# Patient Record
Sex: Female | Born: 1945
Health system: Southern US, Community
[De-identification: ages and names within clinical notes are randomized; demographics above are authoritative.]

## PROBLEM LIST (undated history)

## (undated) DIAGNOSIS — R55 Syncope and collapse: Secondary | ICD-10-CM

## (undated) DIAGNOSIS — E785 Hyperlipidemia, unspecified: Secondary | ICD-10-CM

## (undated) DIAGNOSIS — E1169 Type 2 diabetes mellitus with other specified complication: Secondary | ICD-10-CM

## (undated) DIAGNOSIS — H00019 Hordeolum externum unspecified eye, unspecified eyelid: Secondary | ICD-10-CM

## (undated) DIAGNOSIS — B019 Varicella without complication: Secondary | ICD-10-CM

## (undated) DIAGNOSIS — T7840XA Allergy, unspecified, initial encounter: Secondary | ICD-10-CM

## (undated) DIAGNOSIS — G4730 Sleep apnea, unspecified: Secondary | ICD-10-CM

## (undated) DIAGNOSIS — D219 Benign neoplasm of connective and other soft tissue, unspecified: Secondary | ICD-10-CM

## (undated) DIAGNOSIS — M25562 Pain in left knee: Secondary | ICD-10-CM

## (undated) DIAGNOSIS — Z8781 Personal history of (healed) traumatic fracture: Secondary | ICD-10-CM

## (undated) DIAGNOSIS — M549 Dorsalgia, unspecified: Secondary | ICD-10-CM

## (undated) DIAGNOSIS — M179 Osteoarthritis of knee, unspecified: Secondary | ICD-10-CM

## (undated) DIAGNOSIS — R945 Abnormal results of liver function studies: Secondary | ICD-10-CM

## (undated) DIAGNOSIS — N979 Female infertility, unspecified: Secondary | ICD-10-CM

## (undated) DIAGNOSIS — B059 Measles without complication: Secondary | ICD-10-CM

## (undated) DIAGNOSIS — E663 Overweight: Secondary | ICD-10-CM

## (undated) DIAGNOSIS — F3289 Other specified depressive episodes: Secondary | ICD-10-CM

## (undated) DIAGNOSIS — E079 Disorder of thyroid, unspecified: Secondary | ICD-10-CM

## (undated) DIAGNOSIS — M1710 Unilateral primary osteoarthritis, unspecified knee: Secondary | ICD-10-CM

## (undated) DIAGNOSIS — Z0000 Encounter for general adult medical examination without abnormal findings: Secondary | ICD-10-CM

## (undated) DIAGNOSIS — M2550 Pain in unspecified joint: Secondary | ICD-10-CM

## (undated) DIAGNOSIS — R739 Hyperglycemia, unspecified: Secondary | ICD-10-CM

## (undated) DIAGNOSIS — F329 Major depressive disorder, single episode, unspecified: Secondary | ICD-10-CM

## (undated) DIAGNOSIS — E119 Type 2 diabetes mellitus without complications: Secondary | ICD-10-CM

## (undated) DIAGNOSIS — M1990 Unspecified osteoarthritis, unspecified site: Secondary | ICD-10-CM

## (undated) DIAGNOSIS — I8390 Asymptomatic varicose veins of unspecified lower extremity: Secondary | ICD-10-CM

## (undated) DIAGNOSIS — F419 Anxiety disorder, unspecified: Secondary | ICD-10-CM

## (undated) DIAGNOSIS — E669 Obesity, unspecified: Secondary | ICD-10-CM

## (undated) DIAGNOSIS — M8580 Other specified disorders of bone density and structure, unspecified site: Secondary | ICD-10-CM

## (undated) HISTORY — DX: Unspecified osteoarthritis, unspecified site: M19.90

## (undated) HISTORY — DX: Allergy, unspecified, initial encounter: T78.40XA

## (undated) HISTORY — DX: Type 2 diabetes mellitus without complications: E11.9

## (undated) HISTORY — DX: Type 2 diabetes mellitus with other specified complication: E11.69

## (undated) HISTORY — DX: Personal history of (healed) traumatic fracture: Z87.81

## (undated) HISTORY — PX: OTHER SURGICAL HISTORY: SHX169

## (undated) HISTORY — DX: Other specified disorders of bone density and structure, unspecified site: M85.80

## (undated) HISTORY — DX: Hyperlipidemia, unspecified: E78.5

## (undated) HISTORY — DX: Abnormal results of liver function studies: R94.5

## (undated) HISTORY — DX: Benign neoplasm of connective and other soft tissue, unspecified: D21.9

## (undated) HISTORY — DX: Major depressive disorder, single episode, unspecified: F32.9

## (undated) HISTORY — DX: Obesity, unspecified: E66.9

## (undated) HISTORY — DX: Pain in left knee: M25.562

## (undated) HISTORY — PX: CPAP: SHX449

## (undated) HISTORY — DX: Overweight: E66.3

## (undated) HISTORY — DX: Sleep apnea, unspecified: G47.30

## (undated) HISTORY — PX: COLONOSCOPY: SHX174

## (undated) HISTORY — DX: Encounter for general adult medical examination without abnormal findings: Z00.00

## (undated) HISTORY — DX: Disorder of thyroid, unspecified: E07.9

## (undated) HISTORY — DX: Hordeolum externum unspecified eye, unspecified eyelid: H00.019

## (undated) HISTORY — DX: Varicella without complication: B01.9

## (undated) HISTORY — DX: Asymptomatic varicose veins of unspecified lower extremity: I83.90

## (undated) HISTORY — DX: Dorsalgia, unspecified: M54.9

## (undated) HISTORY — DX: Osteoarthritis of knee, unspecified: M17.9

## (undated) HISTORY — DX: Hyperglycemia, unspecified: R73.9

## (undated) HISTORY — DX: Pain in unspecified joint: M25.50

## (undated) HISTORY — PX: LEEP: SHX91

## (undated) HISTORY — PX: BACK SURGERY: SHX140

## (undated) HISTORY — PX: CERVICAL BIOPSY  W/ LOOP ELECTRODE EXCISION: SUR135

## (undated) HISTORY — DX: Syncope and collapse: R55

## (undated) HISTORY — DX: Unilateral primary osteoarthritis, unspecified knee: M17.10

## (undated) HISTORY — DX: Anxiety disorder, unspecified: F41.9

## (undated) HISTORY — DX: Measles without complication: B05.9

## (undated) HISTORY — DX: Female infertility, unspecified: N97.9

## (undated) HISTORY — DX: Other specified depressive episodes: F32.89

---

## 1996-04-04 HISTORY — PX: LEEP: SHX91

## 2000-04-04 HISTORY — PX: LUMBAR DISC SURGERY: SHX700

## 2003-04-05 ENCOUNTER — Encounter: Payer: Self-pay | Admitting: Internal Medicine

## 2003-06-12 DIAGNOSIS — R0609 Other forms of dyspnea: Secondary | ICD-10-CM | POA: Insufficient documentation

## 2003-06-12 DIAGNOSIS — R0989 Other specified symptoms and signs involving the circulatory and respiratory systems: Secondary | ICD-10-CM | POA: Insufficient documentation

## 2003-06-12 DIAGNOSIS — R635 Abnormal weight gain: Secondary | ICD-10-CM | POA: Insufficient documentation

## 2004-04-04 HISTORY — PX: KNEE SURGERY: SHX244

## 2004-04-08 DIAGNOSIS — S82009A Unspecified fracture of unspecified patella, initial encounter for closed fracture: Secondary | ICD-10-CM | POA: Insufficient documentation

## 2004-11-26 ENCOUNTER — Emergency Department (HOSPITAL_COMMUNITY): Admission: EM | Admit: 2004-11-26 | Discharge: 2004-11-26 | Payer: Self-pay | Admitting: Emergency Medicine

## 2004-12-30 ENCOUNTER — Ambulatory Visit: Payer: Self-pay | Admitting: Internal Medicine

## 2005-07-15 ENCOUNTER — Ambulatory Visit: Payer: Self-pay | Admitting: Internal Medicine

## 2005-07-19 ENCOUNTER — Ambulatory Visit: Payer: Self-pay | Admitting: Internal Medicine

## 2005-07-26 ENCOUNTER — Ambulatory Visit: Payer: Self-pay | Admitting: Pulmonary Disease

## 2005-08-31 ENCOUNTER — Ambulatory Visit: Payer: Self-pay | Admitting: Internal Medicine

## 2005-09-27 ENCOUNTER — Ambulatory Visit: Payer: Self-pay | Admitting: Internal Medicine

## 2005-10-21 ENCOUNTER — Ambulatory Visit (HOSPITAL_BASED_OUTPATIENT_CLINIC_OR_DEPARTMENT_OTHER): Admission: RE | Admit: 2005-10-21 | Discharge: 2005-10-21 | Payer: Self-pay | Admitting: Pulmonary Disease

## 2005-10-21 ENCOUNTER — Ambulatory Visit: Payer: Self-pay | Admitting: Pulmonary Disease

## 2005-11-08 ENCOUNTER — Ambulatory Visit: Payer: Self-pay | Admitting: Internal Medicine

## 2005-11-08 ENCOUNTER — Encounter (INDEPENDENT_AMBULATORY_CARE_PROVIDER_SITE_OTHER): Payer: Self-pay | Admitting: *Deleted

## 2005-11-08 ENCOUNTER — Other Ambulatory Visit: Admission: RE | Admit: 2005-11-08 | Discharge: 2005-11-08 | Payer: Self-pay | Admitting: Internal Medicine

## 2005-11-11 ENCOUNTER — Encounter: Admission: RE | Admit: 2005-11-11 | Discharge: 2005-11-11 | Payer: Self-pay | Admitting: Internal Medicine

## 2005-11-15 ENCOUNTER — Ambulatory Visit: Payer: Self-pay | Admitting: Pulmonary Disease

## 2006-01-20 ENCOUNTER — Ambulatory Visit: Payer: Self-pay | Admitting: Pulmonary Disease

## 2006-01-20 ENCOUNTER — Ambulatory Visit: Payer: Self-pay | Admitting: Gastroenterology

## 2006-02-01 ENCOUNTER — Ambulatory Visit: Payer: Self-pay | Admitting: Gastroenterology

## 2006-02-13 ENCOUNTER — Encounter: Admission: RE | Admit: 2006-02-13 | Discharge: 2006-02-13 | Payer: Self-pay | Admitting: Orthopedic Surgery

## 2006-02-28 ENCOUNTER — Ambulatory Visit: Payer: Self-pay | Admitting: Internal Medicine

## 2006-04-07 ENCOUNTER — Ambulatory Visit: Payer: Self-pay | Admitting: Internal Medicine

## 2006-04-07 LAB — CONVERTED CEMR LAB
ALT: 28 units/L (ref 0–40)
AST: 26 units/L (ref 0–37)
Chol/HDL Ratio, serum: 5
HDL: 46.2 mg/dL (ref >39.0)
Triglyceride fasting, serum: 94 mg/dL (ref 0–149)
VLDL: 19 mg/dL (ref 0–40)

## 2006-04-25 ENCOUNTER — Ambulatory Visit: Payer: Self-pay | Admitting: Internal Medicine

## 2006-06-19 ENCOUNTER — Ambulatory Visit (HOSPITAL_COMMUNITY): Admission: RE | Admit: 2006-06-19 | Discharge: 2006-06-19 | Payer: Self-pay | Admitting: Internal Medicine

## 2006-08-14 ENCOUNTER — Ambulatory Visit: Payer: Self-pay | Admitting: Internal Medicine

## 2006-08-15 LAB — CONVERTED CEMR LAB
Basophils Absolute: 0 10*3/uL (ref 0.0–0.1)
Calcium: 9.5 mg/dL (ref 8.4–10.5)
Eosinophils Absolute: 0.2 10*3/uL (ref 0.0–0.6)
Eosinophils Relative: 2.5 % (ref 0.0–5.0)
GFR calc Af Amer: 110 mL/min
GFR calc non Af Amer: 91 mL/min
Glucose, Bld: 87 mg/dL (ref 70–99)
Lymphocytes Relative: 33.8 % (ref 12.0–46.0)
MCHC: 34.6 g/dL (ref 30.0–36.0)
MCV: 85.6 fL (ref 78.0–100.0)
Neutro Abs: 4 10*3/uL (ref 1.4–7.7)
Platelets: 324 10*3/uL (ref 150–400)
RBC: 4.63 M/uL (ref 3.87–5.11)
Sodium: 141 meq/L (ref 135–145)
TSH: 3.17 microintl units/mL (ref 0.35–5.50)
Total CK: 79 units/L (ref 7–177)
WBC: 7 10*3/uL (ref 4.5–10.5)

## 2006-09-07 ENCOUNTER — Ambulatory Visit: Payer: Self-pay | Admitting: Internal Medicine

## 2006-09-12 ENCOUNTER — Encounter: Admission: RE | Admit: 2006-09-12 | Discharge: 2006-09-12 | Payer: Self-pay | Admitting: Internal Medicine

## 2006-09-15 ENCOUNTER — Ambulatory Visit: Payer: Self-pay | Admitting: Internal Medicine

## 2006-09-22 ENCOUNTER — Encounter: Payer: Self-pay | Admitting: Internal Medicine

## 2006-09-22 DIAGNOSIS — F339 Major depressive disorder, recurrent, unspecified: Secondary | ICD-10-CM | POA: Insufficient documentation

## 2006-09-22 DIAGNOSIS — D259 Leiomyoma of uterus, unspecified: Secondary | ICD-10-CM | POA: Insufficient documentation

## 2006-09-22 DIAGNOSIS — E785 Hyperlipidemia, unspecified: Secondary | ICD-10-CM | POA: Insufficient documentation

## 2006-09-22 DIAGNOSIS — M1990 Unspecified osteoarthritis, unspecified site: Secondary | ICD-10-CM | POA: Insufficient documentation

## 2006-10-23 ENCOUNTER — Ambulatory Visit: Payer: Self-pay | Admitting: Internal Medicine

## 2006-10-23 LAB — CONVERTED CEMR LAB
CO2: 29 meq/L (ref 19–32)
Chloride: 106 meq/L (ref 96–112)
Creatinine, Ser: 0.9 mg/dL (ref 0.4–1.2)
Eosinophils Relative: 1.6 % (ref 0.0–5.0)
Glucose, Bld: 90 mg/dL (ref 70–99)
HCT: 37.3 % (ref 36.0–46.0)
Hemoglobin: 13 g/dL (ref 12.0–15.0)
Neutrophils Relative %: 56.1 % (ref 43.0–77.0)
RBC: 4.36 M/uL (ref 3.87–5.11)
RDW: 13.8 % (ref 11.5–14.6)
Sodium: 142 meq/L (ref 135–145)
Total CHOL/HDL Ratio: 8.8
Triglycerides: 198 mg/dL — ABNORMAL HIGH (ref 0–149)
WBC: 7.3 10*3/uL (ref 4.5–10.5)

## 2006-10-30 ENCOUNTER — Ambulatory Visit: Payer: Self-pay | Admitting: Internal Medicine

## 2006-10-30 DIAGNOSIS — R030 Elevated blood-pressure reading, without diagnosis of hypertension: Secondary | ICD-10-CM | POA: Insufficient documentation

## 2006-12-22 ENCOUNTER — Ambulatory Visit: Payer: Self-pay | Admitting: Internal Medicine

## 2006-12-22 LAB — CONVERTED CEMR LAB
Cholesterol: 268 mg/dL (ref 0–200)
HDL: 32.1 mg/dL — ABNORMAL LOW (ref >39.0)
Total CHOL/HDL Ratio: 8.3
Triglycerides: 123 mg/dL (ref 0–149)
VLDL: 25 mg/dL (ref 0–40)

## 2006-12-29 ENCOUNTER — Ambulatory Visit: Payer: Self-pay | Admitting: Internal Medicine

## 2006-12-29 DIAGNOSIS — M5137 Other intervertebral disc degeneration, lumbosacral region: Secondary | ICD-10-CM | POA: Insufficient documentation

## 2006-12-29 LAB — CONVERTED CEMR LAB: Cholesterol, target level: 200 mg/dL

## 2007-02-22 ENCOUNTER — Ambulatory Visit: Payer: Self-pay | Admitting: Internal Medicine

## 2007-02-25 LAB — CONVERTED CEMR LAB
Cholesterol: 193 mg/dL (ref 0–200)
HDL: 36.5 mg/dL — ABNORMAL LOW (ref >39.0)
LDL Cholesterol: 133 mg/dL — ABNORMAL HIGH (ref 0–99)

## 2007-03-02 ENCOUNTER — Telehealth: Payer: Self-pay | Admitting: *Deleted

## 2007-03-05 ENCOUNTER — Ambulatory Visit: Payer: Self-pay | Admitting: Internal Medicine

## 2007-03-05 DIAGNOSIS — G4733 Obstructive sleep apnea (adult) (pediatric): Secondary | ICD-10-CM | POA: Insufficient documentation

## 2007-03-12 ENCOUNTER — Other Ambulatory Visit: Admission: RE | Admit: 2007-03-12 | Discharge: 2007-03-12 | Payer: Self-pay | Admitting: Obstetrics and Gynecology

## 2007-03-12 ENCOUNTER — Encounter: Payer: Self-pay | Admitting: Internal Medicine

## 2007-03-19 ENCOUNTER — Ambulatory Visit: Payer: Self-pay | Admitting: Internal Medicine

## 2007-06-10 ENCOUNTER — Emergency Department (HOSPITAL_COMMUNITY): Admission: EM | Admit: 2007-06-10 | Discharge: 2007-06-10 | Payer: Self-pay | Admitting: Family Medicine

## 2007-07-13 ENCOUNTER — Encounter: Admission: RE | Admit: 2007-07-13 | Discharge: 2007-07-13 | Payer: Self-pay | Admitting: Obstetrics & Gynecology

## 2007-08-06 ENCOUNTER — Emergency Department (HOSPITAL_COMMUNITY): Admission: EM | Admit: 2007-08-06 | Discharge: 2007-08-06 | Payer: Self-pay | Admitting: Emergency Medicine

## 2007-08-14 ENCOUNTER — Ambulatory Visit: Payer: Self-pay | Admitting: Internal Medicine

## 2007-09-17 ENCOUNTER — Ambulatory Visit: Payer: Self-pay | Admitting: Internal Medicine

## 2007-09-17 LAB — CONVERTED CEMR LAB
ALT: 18 units/L (ref 0–35)
AST: 18 units/L (ref 0–37)
CO2: 27 meq/L (ref 19–32)
Calcium: 8.8 mg/dL (ref 8.4–10.5)
GFR calc Af Amer: 82 mL/min
HDL: 33.5 mg/dL — ABNORMAL LOW (ref >39.0)
Sodium: 140 meq/L (ref 135–145)
Total CHOL/HDL Ratio: 5.6
Vit D, 1,25-Dihydroxy: 30 (ref 30–89)

## 2007-09-24 ENCOUNTER — Ambulatory Visit: Payer: Self-pay | Admitting: Internal Medicine

## 2008-01-14 ENCOUNTER — Telehealth: Payer: Self-pay | Admitting: Internal Medicine

## 2008-01-23 ENCOUNTER — Telehealth: Payer: Self-pay | Admitting: Internal Medicine

## 2008-02-21 ENCOUNTER — Telehealth: Payer: Self-pay | Admitting: *Deleted

## 2008-04-18 ENCOUNTER — Telehealth: Payer: Self-pay | Admitting: Internal Medicine

## 2008-04-21 ENCOUNTER — Other Ambulatory Visit: Admission: RE | Admit: 2008-04-21 | Discharge: 2008-04-21 | Payer: Self-pay | Admitting: Obstetrics and Gynecology

## 2008-04-24 ENCOUNTER — Ambulatory Visit: Payer: Self-pay | Admitting: Internal Medicine

## 2008-04-24 LAB — CONVERTED CEMR LAB
Cholesterol: 247 mg/dL (ref 0–200)
Direct LDL: 188.5 mg/dL
Total CHOL/HDL Ratio: 6.3

## 2008-05-01 ENCOUNTER — Ambulatory Visit: Payer: Self-pay | Admitting: Internal Medicine

## 2008-08-21 ENCOUNTER — Emergency Department (HOSPITAL_COMMUNITY): Admission: EM | Admit: 2008-08-21 | Discharge: 2008-08-21 | Payer: Self-pay | Admitting: Emergency Medicine

## 2008-09-17 ENCOUNTER — Encounter: Admission: RE | Admit: 2008-09-17 | Discharge: 2008-09-17 | Payer: Self-pay | Admitting: Obstetrics & Gynecology

## 2008-09-18 ENCOUNTER — Ambulatory Visit: Payer: Self-pay | Admitting: Pulmonary Disease

## 2008-10-08 ENCOUNTER — Encounter: Payer: Self-pay | Admitting: Pulmonary Disease

## 2008-11-06 ENCOUNTER — Ambulatory Visit: Payer: Self-pay | Admitting: Internal Medicine

## 2008-11-06 DIAGNOSIS — S63509A Unspecified sprain of unspecified wrist, initial encounter: Secondary | ICD-10-CM | POA: Insufficient documentation

## 2008-11-07 ENCOUNTER — Ambulatory Visit: Payer: Self-pay | Admitting: Internal Medicine

## 2008-11-07 LAB — CONVERTED CEMR LAB
ALT: 16 units/L (ref 0–35)
AST: 20 units/L (ref 0–37)
Albumin: 3.3 g/dL — ABNORMAL LOW (ref 3.5–5.2)
HDL: 36.3 mg/dL — ABNORMAL LOW (ref >39.00)
Total Protein: 6.4 g/dL (ref 6.0–8.3)
Triglycerides: 121 mg/dL (ref 0.0–149.0)
Vit D, 25-Hydroxy: 24 ng/mL — ABNORMAL LOW (ref 30–89)

## 2008-11-25 ENCOUNTER — Ambulatory Visit: Payer: Self-pay | Admitting: Internal Medicine

## 2009-01-26 ENCOUNTER — Telehealth: Payer: Self-pay | Admitting: Internal Medicine

## 2009-01-27 ENCOUNTER — Ambulatory Visit: Payer: Self-pay | Admitting: Internal Medicine

## 2009-01-27 DIAGNOSIS — R209 Unspecified disturbances of skin sensation: Secondary | ICD-10-CM | POA: Insufficient documentation

## 2009-01-27 DIAGNOSIS — R0789 Other chest pain: Secondary | ICD-10-CM | POA: Insufficient documentation

## 2009-01-30 ENCOUNTER — Ambulatory Visit: Payer: Self-pay | Admitting: Internal Medicine

## 2009-04-04 LAB — CONVERTED CEMR LAB: Pap Smear: NORMAL

## 2009-04-08 ENCOUNTER — Ambulatory Visit: Payer: Self-pay | Admitting: Internal Medicine

## 2009-04-08 DIAGNOSIS — J019 Acute sinusitis, unspecified: Secondary | ICD-10-CM | POA: Insufficient documentation

## 2009-04-08 DIAGNOSIS — J069 Acute upper respiratory infection, unspecified: Secondary | ICD-10-CM | POA: Insufficient documentation

## 2009-04-10 ENCOUNTER — Telehealth: Payer: Self-pay | Admitting: Internal Medicine

## 2009-05-19 ENCOUNTER — Telehealth: Payer: Self-pay | Admitting: Internal Medicine

## 2009-06-24 ENCOUNTER — Ambulatory Visit: Payer: Self-pay | Admitting: Internal Medicine

## 2009-06-24 LAB — CONVERTED CEMR LAB
Basophils Absolute: 0.1 10*3/uL (ref 0.0–0.1)
Basophils Relative: 1.5 % (ref 0.0–3.0)
Calcium: 9.4 mg/dL (ref 8.4–10.5)
Cholesterol: 206 mg/dL — ABNORMAL HIGH (ref 0–200)
Creatinine, Ser: 0.8 mg/dL (ref 0.4–1.2)
Eosinophils Absolute: 0.1 10*3/uL (ref 0.0–0.7)
HCT: 38.8 % (ref 36.0–46.0)
Hemoglobin: 13 g/dL (ref 12.0–15.0)
Lymphs Abs: 2.4 10*3/uL (ref 0.7–4.0)
MCHC: 33.5 g/dL (ref 30.0–36.0)
Monocytes Relative: 8.4 % (ref 3.0–12.0)
Neutro Abs: 4.3 10*3/uL (ref 1.4–7.7)
RDW: 13.5 % (ref 11.5–14.6)
Triglycerides: 174 mg/dL — ABNORMAL HIGH (ref 0.0–149.0)

## 2009-06-29 ENCOUNTER — Ambulatory Visit: Payer: Self-pay | Admitting: Internal Medicine

## 2009-06-29 DIAGNOSIS — R5381 Other malaise: Secondary | ICD-10-CM | POA: Insufficient documentation

## 2009-06-29 DIAGNOSIS — R7309 Other abnormal glucose: Secondary | ICD-10-CM | POA: Insufficient documentation

## 2009-06-29 DIAGNOSIS — R5383 Other fatigue: Secondary | ICD-10-CM

## 2009-07-08 ENCOUNTER — Telehealth: Payer: Self-pay | Admitting: *Deleted

## 2009-07-17 ENCOUNTER — Ambulatory Visit: Payer: Self-pay | Admitting: Pulmonary Disease

## 2009-09-18 ENCOUNTER — Encounter: Admission: RE | Admit: 2009-09-18 | Discharge: 2009-09-18 | Payer: Self-pay | Admitting: Obstetrics & Gynecology

## 2009-12-14 ENCOUNTER — Telehealth: Payer: Self-pay | Admitting: *Deleted

## 2010-01-04 ENCOUNTER — Ambulatory Visit: Payer: Self-pay | Admitting: Internal Medicine

## 2010-01-04 LAB — CONVERTED CEMR LAB
ALT: 17 units/L (ref 0–35)
Alkaline Phosphatase: 80 units/L (ref 39–117)
BUN: 13 mg/dL (ref 6–23)
Basophils Relative: 0.9 % (ref 0.0–3.0)
Bilirubin, Direct: 0.1 mg/dL (ref 0.0–0.3)
Calcium: 9 mg/dL (ref 8.4–10.5)
Chloride: 101 meq/L (ref 96–112)
Creatinine, Ser: 0.9 mg/dL (ref 0.4–1.2)
Eosinophils Relative: 1.9 % (ref 0.0–5.0)
GFR calc non Af Amer: 68.2 mL/min (ref >60)
HDL: 37.6 mg/dL — ABNORMAL LOW (ref >39.00)
Ketones, urine, test strip: NEGATIVE
LDL Cholesterol: 103 mg/dL — ABNORMAL HIGH (ref 0–99)
Lymphocytes Relative: 30.2 % (ref 12.0–46.0)
Monocytes Relative: 7.2 % (ref 3.0–12.0)
Neutrophils Relative %: 59.8 % (ref 43.0–77.0)
Nitrite: NEGATIVE
Platelets: 258 10*3/uL (ref 150.0–400.0)
Protein, U semiquant: NEGATIVE
RBC: 4.29 M/uL (ref 3.87–5.11)
Total Bilirubin: 0.6 mg/dL (ref 0.3–1.2)
Total CHOL/HDL Ratio: 4
Total Protein: 6.4 g/dL (ref 6.0–8.3)
Triglycerides: 137 mg/dL (ref 0.0–149.0)
Urobilinogen, UA: 0.2
VLDL: 27.4 mg/dL (ref 0.0–40.0)
WBC: 6.6 10*3/uL (ref 4.5–10.5)

## 2010-02-01 ENCOUNTER — Ambulatory Visit: Payer: Self-pay | Admitting: Internal Medicine

## 2010-02-01 ENCOUNTER — Encounter: Payer: Self-pay | Admitting: Internal Medicine

## 2010-02-01 DIAGNOSIS — T7840XA Allergy, unspecified, initial encounter: Secondary | ICD-10-CM

## 2010-02-01 HISTORY — DX: Allergy, unspecified, initial encounter: T78.40XA

## 2010-04-25 ENCOUNTER — Encounter: Payer: Self-pay | Admitting: Orthopedic Surgery

## 2010-05-04 ENCOUNTER — Ambulatory Visit: Admit: 2010-05-04 | Payer: Self-pay | Admitting: Internal Medicine

## 2010-05-04 ENCOUNTER — Ambulatory Visit
Admission: RE | Admit: 2010-05-04 | Discharge: 2010-05-04 | Payer: Self-pay | Source: Home / Self Care | Attending: Internal Medicine | Admitting: Internal Medicine

## 2010-05-04 ENCOUNTER — Other Ambulatory Visit: Payer: Self-pay | Admitting: Internal Medicine

## 2010-05-04 NOTE — Assessment & Plan Note (Signed)
Summary: 6 MNTH ROV//SLM---PT [name] // RS   Vital Signs:  Patient profile:   65 year old female Menstrual status:  postmenopausal Weight:      194 pounds Temp:     98.8 degrees F oral Pulse rate:   78 / minute Pulse rhythm:   regular Resp:     12 per minute BP sitting:   122 / 72  Vitals Entered By: Lynann Beaver CMA (June 29, 2009 9:02 AM) CC: rov Is Patient Diabetic? No Pain Assessment Patient in pain? no        History of Present Illness: Cindy [name] comesin today for  follow up of labs and for multiple medical problems .  Since last visit she has had some resp infection but no major change in health status   . is tired   more than ususal   husband says sometimes snoring through machine.   also job has her getting home later at night at times  .   NOt exercising because woudl rather take a nap  .  No cp sob increase depression . LIPIDS  No change  in med    less exercise  Mood: seems stable   on low dose cymbalta  OSA  see above .    Preventive Screening-Counseling & Management  Alcohol-Tobacco     Alcohol drinks/day: <1     Alcohol type: wine     Smoking Status: never  Caffeine-Diet-Exercise     Caffeine use/day: <1     Does Patient Exercise: not recently      Type of exercise: swim, strength and cardio  Current Medications (verified): 1)  Pravachol 80 Mg  Tabs (Pravastatin Sodium) .Marland Kitchen.. 1 By Mouth Once Daily 2)  Cymbalta 30 Mg Cpep (Duloxetine Hcl) .Marland Kitchen.. 1 By Mouth Once Daily 3)  Cefdinir 300 Mg Caps (Cefdinir) .... One By Mouth Two Times A Day X 10 Days  Allergies (verified): 1)  ! Amoxicillin 2)  ! Sulfa 3)  ! Erythromycin 4)  ! Codeine 5)  * Crestor  Past History:  Past medical, surgical, family and social histories (including risk factors) reviewed, and no changes noted (except as noted below).  Past Medical History: Reviewed history from 09/18/2008 and no changes required. Depression Hyperlipidemia Osteoarthritis Fx Rt Knee  Cap fibroids OSA      - PSG 10/21/05 AHI 60      - CPAP 9 cm H2O Osteopenia    Past Surgical History: Reviewed history from 05/01/2008 and no changes required. C-Section Back Surgery CPAP      Past History:  Care Management: Orthopedics: [facility] Gynecology: Hyacinth Meeker Chiropactor: Katrinka Blazing Dermatology: Derm Specialist Pulmonary  Sood  Family History: Reviewed history from 05/01/2008 and no changes required. Family History High cholesterol Family History Hypertension DM CAD       Social History: Reviewed history from 05/01/2008 and no changes required. Married Never Smoked Regular exercise-yes  sleep  10-11 30  up at 6  job hours irreg    Does Patient Exercise:  not recently   Review of Systems  The patient denies anorexia, fever, weight loss, vision loss, decreased hearing, chest pain, syncope, dyspnea on exertion, peripheral edema, prolonged cough, headaches, abdominal pain, melena, hematochezia, severe indigestion/heartburn, muscle weakness, transient blindness, difficulty walking, unusual weight change, abnormal bleeding, and enlarged lymph nodes.    Physical Exam  General:  alert, well-developed, well-nourished, and well-hydrated.  mildy tired  Head:  Normocephalic and atraumatic without obvious abnormalities. No apparent alopecia or balding. Eyes:  clear  Ears:  R ear normal, L ear normal, and no external deformities.   Nose:  no external deformity and no external erythema.  slight congestion Neck:  No deformities, masses, or tenderness noted. Lungs:  Normal respiratory effort, chest expands symmetrically. Lungs are clear to auscultation, no crackles or wheezes.no dullness.   Heart:  Normal rate and regular rhythm. S1 and S2 normal without gallop, murmur, click, rub or other extra sounds.no lifts.   Abdomen:  Bowel sounds positive,abdomen soft and non-tender without masses, organomegaly or   noted. Pulses:  pulses intact without delay   Extremities:  no clubbing  cyanosis or edema  Neurologic:  non f ocal  Skin:  turgor normal, color normal, no ecchymoses, and no petechiae.   Cervical Nodes:  No lymphadenopathy noted Psych:  Oriented X3, good eye contact, not anxious appearing, and not depressed appearing.   see labs    elevated fbs     Impression & Recommendations:  Problem # 1:  HYPERLIPIDEMIA (ICD-272.4) could be better but has se of   other meds  Her updated medication list for this problem includes:    Pravachol 80 Mg Tabs (Pravastatin sodium) .Marland Kitchen... 1 by mouth once daily  Labs Reviewed: SGOT: 20 (11/07/2008)   SGPT: 16 (11/07/2008)  Lipid Goals: Chol Goal: 200 (12/29/2006)   HDL Goal: 40 (12/29/2006)   LDL Goal: 130 (12/29/2006)   TG Goal: 150 (12/29/2006)  Prior 10 Yr Risk Heart Disease: 13 % (03/05/2007)   HDL:43.60 (06/24/2009), 36.30 (11/07/2008)  LDL:125 (11/07/2008), DEL (04/24/2008)  Chol:206 (06/24/2009), 185 (11/07/2008)  Trig:174.0 (06/24/2009), 121.0 (11/07/2008)  Problem # 2:  OBSTRUCTIVE SLEEP APNEA (ICD-327.23) ? if  needs reassessment based on hx   Problem # 3:  DEPRESSION (ICD-311) Assessment: Unchanged  Her updated medication list for this problem includes:    Cymbalta 30 Mg Cpep (Duloxetine hcl) .Marland Kitchen... 1 by mouth once daily  Problem # 4:  FATIGUE (ICD-780.79) poss related to job hours    and sleep     has not fit in exrecise recently   has gained some weight  Problem # 5:  HYPERGLYCEMIA (ICD-790.29) Assessment: New x 1  fasting   will follow    rec lifestyle intervention   Complete Medication List: 1)  Pravachol 80 Mg Tabs (Pravastatin sodium) .Marland Kitchen.. 1 by mouth once daily 2)  Cymbalta 30 Mg Cpep (Duloxetine hcl) .Marland Kitchen.. 1 by mouth once daily  Patient Instructions: 1)  check with pulmonary   about sleep apnea  2)  exercise and  regulate regular sleep 3)  intensify lifestyle intervention . 4)  CPX with labs in 6 months     do hg a1c with the labs for dx hyperglycemia

## 2010-05-04 NOTE — Progress Notes (Signed)
Summary: Worker's Comp requesting Records  Phone Note Other Incoming Call back at [phone] ext [phone]   Caller: Terrie Lona @ [employer] Summary of Call: Caller requesting medical records for workers comp claim dated 11/06/08.  Advised we would need a signed medical release from patient since claim had not been filed to workers comp. Initial call taken by: Trixie Dredge,  May 19, 2009 9:05 AM

## 2010-05-04 NOTE — Progress Notes (Signed)
Summary: refill  Phone Note From Pharmacy   Caller: [facility]  [address]  [phone]* Reason for Call: Needs renewal Details for Reason: cymbalta 30mg  Initial call taken by: Romualdo Bolk, CMA Duncan Dull),  December 14, 2009 5:05 PM  Follow-up for Phone Call        rx sent to pharmacy Follow-up by: Romualdo Bolk, CMA Duncan Dull),  December 14, 2009 5:05 PM    Prescriptions: CYMBALTA 30 MG CPEP (DULOXETINE HCL) 1 by mouth once daily  #30 Each x 1   Entered by:   Romualdo Bolk, CMA (AAMA)   Authorized by:   Madelin Headings MD   Signed by:   Romualdo Bolk, CMA (AAMA) on 12/14/2009   Method used:   Electronically to        [facility]  [phone]* (retail)       [address]       [place], [place]  [address]       Ph: [phone] or [phone]       Fax: [phone]   RxID:   [id number]   Appended Document: refill     Clinical Lists Changes  Medications: Rx of PRAVACHOL 80 MG  TABS (PRAVASTATIN SODIUM) 1 by mouth once daily;  #30 Each x 1;  Signed;  Entered by: Romualdo Bolk, CMA (AAMA);  Authorized by: Madelin Headings MD;  Method used: Electronically to [facility]  [phone]*, [address] [place], [place], [place]  [address], Ph: [phone] or [phone], Fax: [phone]    Prescriptions: PRAVACHOL 80 MG  TABS (PRAVASTATIN SODIUM) 1 by mouth once daily  #30 Each x 1   Entered by:   Romualdo Bolk, CMA (AAMA)   Authorized by:   Madelin Headings MD   Signed by:   Romualdo Bolk, CMA (AAMA) on 12/14/2009   Method used:   Electronically to        [facility]  [phone]* (retail)       [address]       [place], [place]  [address]       Ph: [phone] or [phone]       Fax: [phone]   RxID:   [id number]

## 2010-05-04 NOTE — Progress Notes (Signed)
Summary: REQ FOR REFILL (Cymbalta)  Phone Note Call from Patient   Caller: Patient  [phone] Reason for Call: Refill Medication Summary of Call: Pt called to adv that she needs a refill on med: Cymbalta 30 Mg .... Pt adv that Rx can be sent to [facility] on [address].... Pt was also adv to req that Rx's be sent electronically in the future - pt acknowledged same.  Initial call taken by: Debbra Riding,  July 08, 2009 9:29 AM  Follow-up for Phone Call        See refill request. Follow-up by: Romualdo Bolk, CMA (AAMA),  July 08, 2009 9:54 AM

## 2010-05-04 NOTE — Assessment & Plan Note (Signed)
Summary: CPX // RS rsc per doc/njr   Vital Signs:  Patient profile:   65 year old female Menstrual status:  postmenopausal Height:      65.25 inches Weight:      175 pounds Pulse rate:   72 / minute BP sitting:   110 / 70  (left arm) Cuff size:   regular  Vitals Entered By: Romualdo Bolk, CMA (AAMA) (February 01, 2010 8:39 AM) CC: CPX without pap- Pt has a gyn who does paps.   History of Present Illness: [name] comes in today  for preventive visit . Since last visit  here  there have been no major changes in health status  .  She has lost 21 pounds on [employer] system and feels well.  No cp sob  left knee pain at times down staits otherwise not limiting.  Nose drips a lot  recently  sneeze at time? allergy.    no asthma or wheeze. OSA : continuing rx  .  Lipids: no se of meds  Mood LBP low dose cymbalta is helping  Preventive Care Screening  Mammogram:    Date:  04/04/2009    Results:  normal   Pap Smear:    Date:  04/04/2009    Results:  normal   Prior Values:    Pap Smear:  Done (04/05/2003)    Mammogram:  Done (06/19/2006)    Colonoscopy:  Done (02/01/2006)    Last Tetanus Booster:  Tdap (11/08/2005)   Preventive Screening-Counseling & Management  Alcohol-Tobacco     Alcohol drinks/day: <1     Alcohol type: wine     Smoking Status: never  Caffeine-Diet-Exercise     Caffeine use/day: <1     Does Patient Exercise: not recently      Type of exercise: swim, strength and cardio  Hep-HIV-STD-Contraception     Dental Visit-last 6 months yes     Sun Exposure-Excessive: no  Safety-Violence-Falls     Seat Belt Use: yes     Firearms in the Home: firearms in the home     Firearm Counseling: not indicated; uses recommended firearm safety measures     Smoke Detectors: yes     Fall Risk: ocass  Current Medications (verified): 1)  Pravachol 80 Mg  Tabs (Pravastatin Sodium) .Marland Kitchen.. 1 By Mouth Once Daily 2)  Cymbalta 30 Mg Cpep (Duloxetine Hcl) .Marland Kitchen.. 1 By  Mouth Once Daily  Allergies (verified): 1)  ! Amoxicillin 2)  ! Sulfa 3)  ! Erythromycin 4)  ! Codeine 5)  * Crestor  Past History:  Past medical, surgical, family and social histories (including risk factors) reviewed, and no changes noted (except as noted below).  Past Medical History: Reviewed history from 07/17/2009 and no changes required. Depression Hyperlipidemia Osteoarthritis Fx Rt Knee Cap fibroids OSA      - PSG 10/21/05 AHI 60      - CPAP 10 cm H2O Osteopenia    Past Surgical History: Reviewed history from 05/01/2008 and no changes required. C-Section Back Surgery CPAP      Past History:  Care Management: Orthopedics: [facility] Gynecology: Hyacinth Meeker Chiropactor: Katrinka Blazing Dermatology: Derm Specialist Pulmonary  Sood  Family History: Reviewed history from 05/01/2008 and no changes required. Family History High cholesterol Family History Hypertension DM CAD       Social History: Reviewed history from 06/29/2009 and no changes required. Married  hhof 2   dog   Never Smoked Regular exercise-yes  sleep  10-11 30  up  at 6  job hours irreg       Dental Care w/in 6 mos.:  yes Sun Exposure-Excessive:  no Risk analyst Use:  yes Fall Risk:  ocass  Review of Systems  The patient denies anorexia, fever, weight gain, vision loss, decreased hearing, hoarseness, chest pain, syncope, dyspnea on exertion, prolonged cough, headaches, hemoptysis, abdominal pain, melena, hematochezia, severe indigestion/heartburn, hematuria, muscle weakness, transient blindness, depression, unusual weight change, abnormal bleeding, enlarged lymph nodes, and angioedema.    Contraindications/Deferment of Procedures/Staging:    Test/Procedure: FLU VAX    Reason for deferment: patient declined   Physical Exam  General:  Well-developed,well-nourished,in no acute distress; alert,appropriate and cooperative throughout examination Head:  normocephalic and atraumatic.   Eyes:  PERRL, EOMs  full, conjunctiva clear  Ears:  R ear normal, L ear normal, and no external deformities.   Nose:  no external deformity and no external erythema.  clear drainage  face non tender  Mouth:  good dentition and pharynx pink and moist.   Neck:  No deformities, masses, or tenderness noted. Breasts:  No mass, nodules, thickening, tenderness, bulging, retraction, inflamation, nipple discharge or skin changes noted.   Lungs:  Normal respiratory effort, chest expands symmetrically. Lungs are clear to auscultation, no crackles or wheezes.no dullness.   Heart:  Normal rate and regular rhythm. S1 and S2 normal without gallop, murmur, click, rub or other extra sounds.no lifts.   Abdomen:  Bowel sounds positive,abdomen soft and non-tender without masses, organomegaly or   noted. Genitalia:  per gyne Msk:  no joint swelling, no joint warmth, and no redness over joints.  left elbow ( where fell)  slight swelling adn no redness at olcrenon  bursa  . nl rom  left knee good rom  seems stable Pulses:  pulses intact without delay   Extremities:  no clubbing cyanosis or edema some oa changes hands  Neurologic:  alert & oriented X3, strength normal in all extremities, gait normal, and DTRs symmetrical and normal.   Skin:  turgor normal, color normal, no ecchymoses, and no petechiae.   Cervical Nodes:  No lymphadenopathy noted Axillary Nodes:  No palpable lymphadenopathy Inguinal Nodes:  No significant adenopathy Psych:  Oriented X3, memory intact for recent and remote, good eye contact, not anxious appearing, and not depressed appearing.     Impression & Recommendations:  Problem # 1:  PREVENTIVE HEALTH CARE (ICD-V70.0)  Discussed nutrition,exercise,diet,healthy weight, vitamin D and calcium.    continue healthy weight loss  Orders: EKG w/ Interpretation (93000)  Problem # 2:  OSTEOARTHRITIS (ICD-715.90) about the same  weight loss should help ok to stay on low dose cymbalta   Problem # 3:  HYPERGLYCEMIA  (ICD-790.29) Assessment: Improved better with weight loss  Problem # 4:  HYPERLIPIDEMIA (ICD-272.4) Assessment: Improved  Her updated medication list for this problem includes:    Pravachol 80 Mg Tabs (Pravastatin sodium) .Marland Kitchen... 1 by mouth once daily  Labs Reviewed: SGOT: 20 (01/04/2010)   SGPT: 17 (01/04/2010)  Lipid Goals: Chol Goal: 200 (12/29/2006)   HDL Goal: 40 (12/29/2006)   LDL Goal: 130 (12/29/2006)   TG Goal: 150 (12/29/2006)  Prior 10 Yr Risk Heart Disease: 13 % (03/05/2007)   HDL:37.60 (01/04/2010), 43.60 (06/24/2009)  LDL:103 (01/04/2010), 125 (11/07/2008)  Chol:168 (01/04/2010), 206 (06/24/2009)  Trig:137.0 (01/04/2010), 174.0 (06/24/2009)  Orders: EKG w/ Interpretation (93000)  Problem # 5:  DEPRESSION (ICD-311) Assessment: Improved stable  and  pain better  Her updated medication list for this problem includes:  Cymbalta 30 Mg Cpep (Duloxetine hcl) .Marland Kitchen... 1 by mouth once daily  Problem # 6:  RHINITIS (ICD-472.0)  ? allergic vs vasomotor     disc poss of dust  allergy as she states she had thois in the past .   trial  INCS and antihistamine as needed. call if needed  Complete Medication List: 1)  Pravachol 80 Mg Tabs (Pravastatin sodium) .Marland Kitchen.. 1 by mouth once daily 2)  Cymbalta 30 Mg Cpep (Duloxetine hcl) .Marland Kitchen.. 1 by mouth once daily 3)  Fluticasone Propionate 50 Mcg/act Susp (Fluticasone propionate) .... 2 sprays each nostril once daily  Patient Instructions: 1)  continue healthy  lifestyle  2)  try flonase once daily  to suppress nasal allergy signs  3)  can also add claritin  otc for drippiness. 4)  Make environmental changes to decrease dust mite exposure.  5)  cpx in a year with labs or as needed  Prescriptions: CYMBALTA 30 MG CPEP (DULOXETINE HCL) 1 by mouth once daily  #30 Each x 12   Entered and Authorized by:   Madelin Headings MD   Signed by:   Madelin Headings MD on 02/01/2010   Method used:   Electronically to        [facility] (retail)        [address]       [place], [place]  [address]       Ph: [phone]       Fax: [phone]   RxID:   [id number] PRAVACHOL 80 MG  TABS (PRAVASTATIN SODIUM) 1 by mouth once daily  #30 Each x 12   Entered and Authorized by:   Madelin Headings MD   Signed by:   Madelin Headings MD on 02/01/2010   Method used:   Electronically to        [facility] (retail)       [address]       [place], [place]  [address]       Ph: [phone]       Fax: [phone]   RxID:   [id number] FLUTICASONE PROPIONATE 50 MCG/ACT SUSP (FLUTICASONE PROPIONATE) 2 sprays each nostril once daily  #1 x 12   Entered and Authorized by:   Madelin Headings MD   Signed by:   Madelin Headings MD on 02/01/2010   Method used:   Electronically to        [facility] (retail)       [address]       [place], [place]  [address]       Ph: [phone]       Fax: [phone]   RxID:   [id number]    Orders Added: 1)  Est. Patient 40-64 years [99396] 2)  Est. Patient Level II [51761] 3)  EKG w/ Interpretation [93000]

## 2010-05-04 NOTE — Progress Notes (Signed)
Summary: needs antibiotic  Phone Note Call from Patient   Caller: Patient Cindy [name] Reason for Call: Talk to Doctor Summary of Call: pc frm patient [phone] patient saw Dr Fabian Sharp earlier in the week for sinus infection, was told to call later in the week if she felt she needed the antibiotic.  Wednesday she felt better but it has returned.  She is going out of town for the week and would like to go ahead an obtain antibiotic.    [facility] on [address].    Initial call taken by: Felipa Evener,  April 10, 2009 11:29 AM  Follow-up for Phone Call        ask if she is able to take  [facility] or other cephalosporins  if so then omnicef  300 two times a day for 10days  disp 20# Follow-up by: Madelin Headings MD,  April 10, 2009 1:07 PM  Additional Follow-up for Phone Call Additional follow up Details #1::        No known allergies to [facility] per pt. Additional Follow-up by: Lynann Beaver CMA,  April 10, 2009 1:42 PM    New/Updated Medications: CEFDINIR 300 MG CAPS (CEFDINIR) one by mouth two times a day x 10 days Prescriptions: CEFDINIR 300 MG CAPS (CEFDINIR) one by mouth two times a day x 10 days  #20 x 0   Entered by:   Lynann Beaver CMA   Authorized by:   Madelin Headings MD   Signed by:   Lynann Beaver CMA on 04/10/2009   Method used:   Electronically to        [facility]  [phone]* (retail)       [address]       [place], [place]  [address]       Ph: [phone] or [phone]       Fax: [phone]   RxID:   [id number]

## 2010-05-04 NOTE — Assessment & Plan Note (Signed)
Summary: rov ///kp   Primary Provider/Referring Provider:  Madelin Headings MD  CC:  OSA.  The patient states she has been having trouble with the CPAP for approx 9 months. She has been feeling more fatigue x6 months and her working hours have changed and she thinks her mask is leaking.Marland Kitchen  History of Present Illness: 65 yo female with OSA on CPAP 10 cm H2O.  She went on a trip to [place] recently.  TSA wiped some liquid on her CPAP machine, and after that her display no longer worked.  Also at this time she started feeling more fatigued, and her husband says that she snores more.  She is not having any problem with her mask.     Current Medications (verified): 1)  Pravachol 80 Mg  Tabs (Pravastatin Sodium) .Marland Kitchen.. 1 By Mouth Once Daily 2)  Cymbalta 30 Mg Cpep (Duloxetine Hcl) .Marland Kitchen.. 1 By Mouth Once Daily  Allergies (verified): 1)  ! Amoxicillin 2)  ! Sulfa 3)  ! Erythromycin 4)  ! Codeine 5)  * Crestor  Past History:  Past Medical History: Depression Hyperlipidemia Osteoarthritis Fx Rt Knee Cap fibroids OSA      - PSG 10/21/05 AHI 60      - CPAP 10 cm H2O Osteopenia    Past Surgical History: Reviewed history from 05/01/2008 and no changes required. C-Section Back Surgery CPAP      Vital Signs:  Patient profile:   65 year old female Menstrual status:  postmenopausal Height:      65.25 inches (165.74 cm) Weight:      196 pounds (89.09 kg) BMI:     32.48 O2 Sat:      96 % on Room air Temp:     98.1 degrees F (36.72 degrees C) oral Pulse rate:   68 / minute BP sitting:   130 / 78  (left arm) Cuff size:   regular  Vitals Entered By: Michel Bickers CMA (July 17, 2009 4:52 PM)  O2 Sat at Rest %:  96 O2 Flow:  Room air  Physical Exam  General:  normal appearance, healthy appearing, and obese.   Nose:  no deformity, discharge, inflammation, or lesions Mouth:  no deformity or lesions Neck:  no JVD.   Lungs:  clear bilaterally to auscultation and percussion Heart:   regular rate and rhythm, S1, S2 without murmurs, rubs, gallops, or clicks Extremities:  no clubbing, cyanosis, edema, or deformity noted Cervical Nodes:  no significant adenopathy   Impression & Recommendations:  Problem # 1:  OBSTRUCTIVE SLEEP APNEA (ICD-327.23) I am not sure if her CPAP machine was broken by TSA or if she needs higher pressure on for her CPAP.  I will get a download from her machine.  If she is not getting appropriate pressure, then she will need a new machine.  If her machine is working okay, she will need an auto-CPAP titration.  Complete Medication List: 1)  Pravachol 80 Mg Tabs (Pravastatin sodium) .Marland Kitchen.. 1 by mouth once daily 2)  Cymbalta 30 Mg Cpep (Duloxetine hcl) .Marland Kitchen.. 1 by mouth once daily  Other Orders: Est. Patient Level II (95284) DME Referral (DME)  Patient Instructions: 1)  Will check pressure on CPAP machine 2)  Follow up in one year  Vital Signs:  Patient profile:   65 year old female Menstrual status:  postmenopausal Height:      65.25 inches (165.74 cm) Weight:      196 pounds (89.09 kg) BMI:  32.48 O2 Sat:      96 % on Room air Temp:     98.1 degrees F (36.72 degrees C) oral Pulse rate:   68 / minute BP sitting:   130 / 78  (left arm) Cuff size:   regular  Vitals Entered By: Michel Bickers CMA (July 17, 2009 4:52 PM)  O2 Sat at Rest %:  96 O2 Flow:  Room air   Current Medications (verified): 1)  Pravachol 80 Mg  Tabs (Pravastatin Sodium) .Marland Kitchen.. 1 By Mouth Once Daily 2)  Cymbalta 30 Mg Cpep (Duloxetine Hcl) .Marland Kitchen.. 1 By Mouth Once Daily  Allergies (verified): 1)  ! Amoxicillin 2)  ! Sulfa 3)  ! Erythromycin 4)  ! Codeine 5)  * Crestor

## 2010-05-04 NOTE — Assessment & Plan Note (Signed)
Summary: SINUS/SSC   Vital Signs:  Patient profile:   65 year old female Menstrual status:  postmenopausal Weight:      188 pounds Temp:     98.2 degrees F oral Pulse rate:   66 / minute BP sitting:   100 / 60  (left arm) Cuff size:   regular  Vitals Entered By: Romualdo Bolk, CMA (AAMA) (April 08, 2009 12:31 PM) CC: Sore throat on 1/4 but has now gone away. Some sinus pressure on left side.    History of Present Illness: Cindy [name] comes in today for a day or 2 of above. though she was getting a sinus infection but getting better today. St nose clogged and right face pressure.  No fever cp or sob.  No strep exposure.   has 17 mos Grand child  exposure but not sick .  to go out of town this weekend.    Preventive Screening-Counseling & Management  Alcohol-Tobacco     Alcohol drinks/day: <1     Alcohol type: wine     Smoking Status: never  Caffeine-Diet-Exercise     Caffeine use/day: <1     Does Patient Exercise: yes     Type of exercise: swim, strength and cardio  Current Medications (verified): 1)  Pravachol 80 Mg  Tabs (Pravastatin Sodium) .Marland Kitchen.. 1 By Mouth Once Daily 2)  Cymbalta 30 Mg Cpep (Duloxetine Hcl) .Marland Kitchen.. 1 By Mouth Once Daily  Allergies (verified): 1)  ! Amoxicillin 2)  ! Sulfa 3)  ! Erythromycin 4)  ! Codeine 5)  * Crestor  Past History:  Past medical, surgical, family and social histories (including risk factors) reviewed for relevance to current acute and chronic problems.  Past Medical History: Reviewed history from 09/18/2008 and no changes required. Depression Hyperlipidemia Osteoarthritis Fx Rt Knee Cap fibroids OSA      - PSG 10/21/05 AHI 60      - CPAP 9 cm H2O Osteopenia    Past Surgical History: Reviewed history from 05/01/2008 and no changes required. C-Section Back Surgery CPAP      Family History: Reviewed history from 05/01/2008 and no changes required. Family History High cholesterol Family History  Hypertension DM CAD       Social History: Reviewed history from 05/01/2008 and no changes required. Married Never Smoked Regular exercise-yes      Review of Systems  The patient denies anorexia, fever, weight loss, chest pain, prolonged cough, headaches, abdominal pain, abnormal bleeding, enlarged lymph nodes, and angioedema.         glands  in neck were tender yesterday  Physical Exam  General:  Well-developed,well-nourished,in no acute distress; alert,appropriate and cooperative throughout examinationcongested  Head:  normocephalic, atraumatic, no abnormalities observed, and no abnormalities palpated.   Eyes:  vision grossly intact, pupils equal, and pupils round.   Ears:  R ear normal and L ear normal.   Nose:  no external deformity and no external erythema.  mucoid discharge  Mouth:  mild erythema n lesions or edema Neck:  shoddy nodes  Lungs:  Normal respiratory effort, chest expands symmetrically. Lungs are clear to auscultation, no crackles or wheezes.no dullness.   Heart:  normal rate, regular rhythm, and no murmur.   Skin:  turgor normal and color normal.   Cervical Nodes:  shoddy nodes  Psych:  Oriented X3, good eye contact, not anxious appearing, and not depressed appearing.     Impression & Recommendations:  Problem # 1:  URI (ICD-465.9) prob viral  counseled about  use of antibiotics for  bacterial infectinos only  at present  seems viral  but at risk for bacterial infections=.   Problem # 2:  SINUSITIS - ACUTE-NOS (ICD-461.9) see above  prob viral   if persistent and progressive   consider adding antibiotic rx   Complete Medication List: 1)  Pravachol 80 Mg Tabs (Pravastatin sodium) .Marland Kitchen.. 1 by mouth once daily 2)  Cymbalta 30 Mg Cpep (Duloxetine hcl) .Marland Kitchen.. 1 by mouth once daily  Patient Instructions: 1)  Acute sinusitis symptoms for less than 10 days are not helped by antibiotics. Use warm moist compresses, and over the counter decongestants( only as  directed). Call if no improvement in 5-7 days, sooner if increasing pain, fever, or new symptoms.  2)  Stay on saline nose spray.   and can use  afrin nose spray( liimit 3 days)  if needed for clogged.  3)  Call  if worse before the weekend.

## 2010-05-07 ENCOUNTER — Telehealth: Payer: Self-pay | Admitting: *Deleted

## 2010-05-07 LAB — WOUND CULTURE: Organism ID, Bacteria: NORMAL

## 2010-05-07 NOTE — Telephone Encounter (Signed)
Message copied by Tor Netters on Fri May 07, 2010  4:50 PM ------      Message from: [facility], [place]      Created: Fri May 07, 2010  4:31 PM       Tell patient no staph  Noted on her culture

## 2010-05-07 NOTE — Telephone Encounter (Signed)
Pt states that it is still bothering a bit. She is aware of her appt with Dr. Craige Cotta.

## 2010-05-11 ENCOUNTER — Telehealth: Payer: Self-pay | Admitting: *Deleted

## 2010-05-11 ENCOUNTER — Inpatient Hospital Stay (INDEPENDENT_AMBULATORY_CARE_PROVIDER_SITE_OTHER)
Admission: RE | Admit: 2010-05-11 | Discharge: 2010-05-11 | Disposition: A | Discharge status: Home / Self Care | Payer: BC Managed Care – PPO | Source: Ambulatory Visit | Attending: Family Medicine | Admitting: Family Medicine

## 2010-05-11 DIAGNOSIS — J310 Chronic rhinitis: Secondary | ICD-10-CM

## 2010-05-11 NOTE — Telephone Encounter (Signed)
Per Dr. Fabian Sharp- need more info. Left message to call back.

## 2010-05-11 NOTE — Telephone Encounter (Signed)
Pt is having sinus infection symptoms, URI, cough, and would like a Rx of Zpack.

## 2010-05-12 NOTE — Assessment & Plan Note (Signed)
Summary: pain in nose/ssc   Vital Signs:  Patient profile:   65 year old female Menstrual status:  postmenopausal Height:      65.25 inches Weight:      183 pounds BMI:     30.33 Temp:     97.8 degrees F oral Pulse rate:   78 / minute BP sitting:   130 / 80  (left arm) Cuff size:   regular  Vitals Entered By: Romualdo Bolk, CMA (AAMA) (May 04, 2010 9:03 AM) CC: Left sided nostril has an unplesant feeling inside. Worse when she inhales.   History of Present Illness: Cindy [name] comes in today  for acute appt    for  funny feeling left nostril  onset about 1-2 weeks ago.    Uses Cpap  No more moisturizer area.   used vaseline and then menthol as up nose with some help. some better today .  no fever crusting or swelling..  has some sneexzng fits ocass ? allergy.No saline used.     hasnt had slep follow up over a year  never got a reminder but has been doing well   Preventive Screening-Counseling & Management  Alcohol-Tobacco     Alcohol drinks/day: <1     Alcohol type: wine     Smoking Status: never  Caffeine-Diet-Exercise     Caffeine use/day: <1     Does Patient Exercise: not recently      Type of exercise: swim, strength and cardio  Current Medications (verified): 1)  Pravachol 80 Mg  Tabs (Pravastatin Sodium) .Marland Kitchen.. 1 By Mouth Once Daily 2)  Cymbalta 30 Mg Cpep (Duloxetine Hcl) .Marland Kitchen.. 1 By Mouth Once Daily  Allergies (verified): 1)  ! Amoxicillin 2)  ! Sulfa 3)  ! Erythromycin 4)  ! Codeine 5)  * Crestor  Past History:  Past medical, surgical, family and social histories (including risk factors) reviewed for relevance to current acute and chronic problems.  Past Medical History: Reviewed history from 07/17/2009 and no changes required. Depression Hyperlipidemia Osteoarthritis Fx Rt Knee Cap fibroids OSA      - PSG 10/21/05 AHI 60      - CPAP 10 cm H2O Osteopenia    Past Surgical History: Reviewed history from 05/01/2008 and no changes  required. C-Section Back Surgery CPAP      Past History:  Care Management: Orthopedics: [facility] Gynecology: Hyacinth Meeker Chiropactor: Katrinka Blazing Dermatology: [facility]  [facility]  Family History: Reviewed history from 05/01/2008 and no changes required. Family History High cholesterol Family History Hypertension DM CAD       Social History: Reviewed history from 02/01/2010 and no changes required. Married  hhof 2   dog   Never Smoked Regular exercise-yes  sleep  10-11 30  up at 6  job hours irreg         Review of Systems  The patient denies anorexia, fever, hoarseness, chest pain, prolonged cough, and hemoptysis.    Physical Exam  General:  Well-developed,well-nourished,in no acute distress; alert,appropriate and cooperative throughout examination Head:  normocephalic and atraumatic.   Eyes:  vision grossly intact.   Ears:  R ear normal, L ear normal, and no external deformities.   Nose:  no redness patent left  tubinate  1+ no discharge    boddy purplish and pale  no blood   swab culture taken Mouth:  pharynx pink and moist.   Neck:  No deformities, masses, or tenderness noted. Pulses:  nl cap refill  Cervical Nodes:  No lymphadenopathy noted Psych:  Oriented X3, good eye contact, and not anxious appearing.     Impression & Recommendations:  Problem # 1:  RHINITIS (ICD-472.0)  left sided   prominent turbinate     no lesion of infection obvious  but swab done     ayr samples given  gel and  swab samples  . if persistent or  progressive  call and consider ent. check  Problem # 2:  OBSTRUCTIVE SLEEP APNEA (ICD-327.23)  due for check  last in ehr was july 2010   will get her referred back   Orders: Pulmonary Referral (Pulmonary)  Problem # 3:  alergic ? doesnt seem likj cause of today problem  Complete Medication List: 1)  Pravachol 80 Mg Tabs (Pravastatin sodium) .Marland Kitchen.. 1 by mouth once daily 2)  Cymbalta 30 Mg Cpep (Duloxetine hcl) .Marland Kitchen.. 1 by mouth once  daily  Other Orders: T-Culture, Wound ([id number])  Patient Instructions: 1)  You will be informed of lab results when available.   doesnt really seem like an infection 2)  dryness  could do this plus allergy.  use nose moisturizer . 3)  cll if pain  discharge  ... 4)  Will get you back t  sleep pulmonary as you are due for  check.   Orders Added: 1)  T-Culture, Wound [[id number]] 2)  Est. Patient Level III [16109] 3)  Pulmonary Referral [Pulmonary]  Appended Document: Orders Update     Clinical Lists Changes  Orders: Added new Service order of Specimen Handling ([id number]) - Signed

## 2010-05-12 NOTE — Telephone Encounter (Signed)
Left message on machine to call back with more info on her symptoms.

## 2010-05-12 NOTE — Telephone Encounter (Signed)
Received a fax from [facility]. Pt was seen on 2/7 and dx with rhinitis.

## 2010-05-24 ENCOUNTER — Ambulatory Visit: Payer: Self-pay | Admitting: Pulmonary Disease

## 2010-06-21 ENCOUNTER — Ambulatory Visit: Payer: Self-pay | Admitting: Pulmonary Disease

## 2010-07-16 ENCOUNTER — Encounter: Payer: Self-pay | Admitting: Pulmonary Disease

## 2010-07-20 ENCOUNTER — Encounter: Payer: Self-pay | Admitting: Pulmonary Disease

## 2010-07-20 ENCOUNTER — Ambulatory Visit (INDEPENDENT_AMBULATORY_CARE_PROVIDER_SITE_OTHER): Payer: BC Managed Care – PPO | Admitting: Pulmonary Disease

## 2010-07-20 VITALS — BP 122/70 | HR 86 | Temp 98.4°F | Ht 65.25 in | Wt 192.6 lb

## 2010-07-20 DIAGNOSIS — G4733 Obstructive sleep apnea (adult) (pediatric): Secondary | ICD-10-CM

## 2010-07-20 NOTE — Progress Notes (Signed)
Subjective:    Patient ID: Cindy [name], female    DOB: [birth year], 65 y.o.   MRN: [id number]  HPI 65 yo female with OSA on CPAP 10 cm H2O.  She has been doing well with CPAP.  She is not having any trouble with her mask.  She never got a new humidifier, but has not noticed any trouble.  She was treated for a sinus infection in the winter, but this has resolved.  She sleeps well, and if she gets 8 hours sleep a night she feels good.  Her CPAP download showed almost 100% compliance with average 8hrs per night.  Past Medical History  Diagnosis Date  . Other and unspecified hyperlipidemia   . Depressive disorder, not elsewhere classified   . Osteoarthritis   . Sleep apnea      Family History  Problem Relation Age of Onset  . Hypertension    . Coronary artery disease    . Diabetes       History   Social History  . Marital Status: Married    Spouse Name: N/A    Number of Children: N/A  . Years of Education: N/A   Occupational History  . Not on file.   Social History Main Topics  . Smoking status: Never Smoker   . Smokeless tobacco: Not on file  . Alcohol Use: Not on file  . Drug Use: Not on file  . Sexually Active: Not on file   Other Topics Concern  . Not on file   Social History Narrative  . No narrative on file     Allergies  Allergen Reactions  . Amoxicillin   . Codeine   . Erythromycin   . Rosuvastatin     REACTION: myalgias  . Sulfonamide Derivatives      Outpatient Prescriptions Prior to Visit  Medication Sig Dispense Refill  . DULoxetine (CYMBALTA) 30 MG capsule Take 30 mg by mouth daily.        . pravastatin (PRAVACHOL) 80 MG tablet Take 80 mg by mouth daily.             Review of Systems     Objective:   Physical Exam Filed Vitals:   07/20/10 1434 07/20/10 1435  BP:  122/70  Pulse:  86  Temp: 98.4 F (36.9 C)   TempSrc: Oral   Height: 5' 5.25" (1.657 m)   Weight: 192 lb 9.6 oz (87.363 kg)   SpO2:  96%       General: normal  appearance, healthy appearing, and obese.  Nose: no deformity, discharge, inflammation, or lesions  Mouth: no deformity or lesions  Neck: no JVD.  Lungs: clear bilaterally to auscultation and percussion  Heart: regular rate and rhythm, S1, S2 without murmurs, rubs, gallops, or clicks  Extremities: no clubbing, cyanosis, edema, or deformity noted  Cervical Nodes: no significant adenopathy    Assessment & Plan:   OBSTRUCTIVE SLEEP APNEA She is doing well with CPAP.  She has not been using a humidifier, and has not had any trouble.  She is to continue on her current set up, and call if she has problems.      Updated Medication List Outpatient Encounter Prescriptions as of 07/20/2010  Medication Sig Dispense Refill  . DULoxetine (CYMBALTA) 30 MG capsule Take 30 mg by mouth daily.        . fluticasone (FLONASE) 50 MCG/ACT nasal spray As needed      . pravastatin (PRAVACHOL) 80 MG tablet Take 80  mg by mouth daily.

## 2010-07-20 NOTE — Assessment & Plan Note (Signed)
She is doing well with CPAP.  She has not been using a humidifier, and has not had any trouble.  She is to continue on her current set up, and call if she has problems.

## 2010-07-20 NOTE — Patient Instructions (Signed)
Follow up in one year Call sooner if you have problems with CPAP machine

## 2010-08-20 NOTE — Assessment & Plan Note (Signed)
[facility]                               PULMONARY OFFICE NOTE   KAZI, MONTORO                       MRN:          [id number]  DATE:01/20/2006                            DOB:          Feb 19, 1946    PULMONARY FOLLOWUP VISIT   I saw Ms. Wissner in followup today for her severe obstructive sleep apnea.  She has since been started on CPAP at 9 cm of water.  She is currently using  nasal pillows with a heated humidifier and a chin strap.  Her current sleep  pattern is that she is going to bed at about midnight.  She is using her  machine on a nightly basis for the entire time that she is asleep.  She says  that she has noticed a difference as far as how she feels during the day,  specifically, she said that a work colleague had actually commented that she  does not complain about feeling tired any more.  Additionally, her husband  has not noticed that she snores anymore either.  She is not having any other  difficulties as far as wearing the mask.  At this time, I have encouraged  her to maintain her compliance with CPAP therapy and I have also encouraged  her to initiate a diet and exercise weight reduction program.  Otherwise, I  would plan on following up with her in about 4 to 6 months to reassess her  tolerance of CPAP therapy and I have advised her that she could also contact  her home care company if she is having difficulties prior to that.       Coralyn Helling, MD      VS/MedQ  DD:  01/20/2006  DT:  01/23/2006  Job #:  [id number]   cc:   Neta Mends. Fabian Sharp, MD

## 2010-08-20 NOTE — Assessment & Plan Note (Signed)
[facility]                               PULMONARY OFFICE NOTE   EIRA, ALPERT                       MRN:          [id number]  DATE:11/15/2005                            DOB:          May 09, 1945    REFERRING PHYSICIAN:  Neta Mends. Panosh, MD   I saw Ms. Heffern in followup today after she had undergone her overnight  polysomnogram on October 21, 2005 and this showed evidence for severe  obstructive sleep apnea as demonstrated by an apnea-hypopnea index of 58 and  oxygen saturation nadir of 76%.  Of note is that she had a significant REM  and positional effect to her sleep apnea.  At this time I have reviewed the  results of her sleep study with her and I had detailed several other  treatment options for her sleep apnea including diet, exercise and weight  reduction as well as CPAP therapy, oral appliance and surgical intervention.  At this time she is agreeable to undergo CPAP therapy particularly in light  of the severity of her sleep apnea.  At this point, I will make arrangements  for her to undergo an auto-CPAP titration study at home for 2 weeks and then  depending upon review of the download from this, would initiate her on a  fixed pressure setting for a CPAP.  If she is not able to be adequately  titrated by this means however then I would give consideration to having her  return to the sleep lab for a CPAP titration study.  I will call her with  the results of her auto-CPAP titration and then plan on having her follow up  with me in the office in 6-8 weeks.                                   Coralyn Helling, MD   VS/MedQ  DD:  11/15/2005  DT:  11/16/2005  Job #:  [id number]   cc:   Neta Mends. Fabian Sharp, MD

## 2010-08-20 NOTE — Assessment & Plan Note (Signed)
[facility]                               PULMONARY OFFICE NOTE   NAME:TOBIASTynia, Cindy [name]                       MRN:          [id number]  DATE:12/21/2005                            DOB:          [birth year]    I had received a copy of Cindy [name]' auto-CPAP download and it appears at  the 95th percentile pressure setting of 9, her average apnea-hypopnea index  was 3.8, down from 58 during her diagnostic study.  I will make arrangements  for her to be established on a fixed pressure setting of 9 cm water and then  follow up with her in the office to assess her tolerance and compliance with  CPAP therapy.                                   Coralyn Helling, MD   VS/MedQ  DD:  12/21/2005  DT:  12/22/2005  Job #:  [id number]

## 2010-08-20 NOTE — Procedures (Signed)
Cindy [name], HULLUM NO.:  [id number]   MEDICAL RECORD NO.:  [id number]          PATIENT TYPE:  OUT   LOCATION:  SLEEP CENTER                 FACILITY[facility]  [facility]   PHYSICIAN:  Coralyn Helling, M.D.      DATE OF BIRTH:  October 20, 1945   DATE OF STUDY:  10/21/2005                              NOCTURNAL POLYSOMNOGRAM   INDICATIONS FOR STUDY:  This is an individual who has been noted to have  loud snoring, witnessed apnea and excessive daytime sleepiness who was  referred to the sleep lab for evaluation of obstructive sleep apnea.  Epworth score is 12.  Medications are Cymbalta, Crestor, Glucosamine,  vitamin E, fish oil and a multivitamin.   Sleep architecture:  Total recording time is 424.5 minutes, total sleep time  was 381 minutes, sleep efficiency was 90%.  The study was notable for a lack  of slow wave sleep.  Sleep latency was 16 minutes, REM latency is 127.5  minutes.  The patient slept in both the supine and non supine position.   Respiratory data:  The average respiratory rate was 14, the apnea hypopnea  index was 58, the events were exclusively obstructive in nature.  Of note is  that there was a significant supine effect as well as REM effect.  The  supine apnea hypopnea index was 110.8 versus the non supine index of 9.6 and  the REM index was 66.5 and loud snoring was noted by the technician.   Oxygen data:  The baseline oxygenation was 98%, the oxygen saturation nadir  was 76%.  The patient spent a total of 392.6 minutes with an oxygen  saturation between 91 and 100%, 18.9 minutes with an oxygen saturation  between 81 and 90% and 0.9 minutes with an oxygen saturation between 71 and  80%.   Cardiac data:  The EKG showed normal sinus rhythm, movement parasomnia.  The  periodic limb movement index was 5.   IMPRESSION:  This study shows evidence of severe obstructive sleep apnea as  demonstrated by apnea hypopnea index of 58 and an oxygen saturation nadir of  76%.  Of note is that there was a significant positional as well as REM  effect.  At this time the patient should be counseled regarding points of  diet, exercise and weight reduction, avoidance of alcohol and sedatives.  Driving precautions should be discussed with the patient as well and then  strong consideration should be given to having the patient referred back to  the sleep lab for a CPAP titration study.  Alternatively, the patient could  undergo an Auto-CPAP titration study as an outpatient.      Coralyn Helling, M.D.  Diplomat, Biomedical engineer of Sleep Medicine  Electronically Signed     VS/MEDQ  D:  10/27/2005 16:04:38  T:  10/27/2005 17:41:46  Job:  [id number]

## 2010-09-14 ENCOUNTER — Other Ambulatory Visit: Payer: Self-pay | Admitting: Internal Medicine

## 2010-09-14 DIAGNOSIS — Z1231 Encounter for screening mammogram for malignant neoplasm of breast: Secondary | ICD-10-CM

## 2010-09-23 ENCOUNTER — Ambulatory Visit (HOSPITAL_COMMUNITY)
Admission: RE | Admit: 2010-09-23 | Discharge: 2010-09-23 | Disposition: A | Discharge status: Home / Self Care | Payer: BC Managed Care – PPO | Source: Ambulatory Visit | Attending: Internal Medicine | Admitting: Internal Medicine

## 2010-09-23 DIAGNOSIS — Z1231 Encounter for screening mammogram for malignant neoplasm of breast: Secondary | ICD-10-CM

## 2010-12-15 ENCOUNTER — Emergency Department (HOSPITAL_COMMUNITY): Payer: BC Managed Care – PPO

## 2010-12-15 ENCOUNTER — Emergency Department (HOSPITAL_COMMUNITY)
Admission: EM | Admit: 2010-12-15 | Discharge: 2010-12-15 | Disposition: A | Discharge status: Home / Self Care | Payer: BC Managed Care – PPO | Attending: Emergency Medicine | Admitting: Emergency Medicine

## 2010-12-15 DIAGNOSIS — E7800 Pure hypercholesterolemia, unspecified: Secondary | ICD-10-CM | POA: Insufficient documentation

## 2010-12-15 DIAGNOSIS — S8990XA Unspecified injury of unspecified lower leg, initial encounter: Secondary | ICD-10-CM | POA: Insufficient documentation

## 2010-12-15 DIAGNOSIS — M79609 Pain in unspecified limb: Secondary | ICD-10-CM | POA: Insufficient documentation

## 2010-12-15 DIAGNOSIS — Z79899 Other long term (current) drug therapy: Secondary | ICD-10-CM | POA: Insufficient documentation

## 2010-12-15 DIAGNOSIS — S92919A Unspecified fracture of unspecified toe(s), initial encounter for closed fracture: Secondary | ICD-10-CM | POA: Insufficient documentation

## 2010-12-15 DIAGNOSIS — W108XXA Fall (on) (from) other stairs and steps, initial encounter: Secondary | ICD-10-CM | POA: Insufficient documentation

## 2010-12-15 DIAGNOSIS — S99919A Unspecified injury of unspecified ankle, initial encounter: Secondary | ICD-10-CM | POA: Insufficient documentation

## 2011-02-21 ENCOUNTER — Other Ambulatory Visit: Payer: Self-pay | Admitting: Internal Medicine

## 2011-04-27 ENCOUNTER — Telehealth: Payer: Self-pay | Admitting: Internal Medicine

## 2011-04-27 MED ORDER — FLUTICASONE PROPIONATE 50 MCG/ACT NA SUSP: NASAL | Status: DC

## 2011-04-27 NOTE — Telephone Encounter (Signed)
Refill Flonase at [facility]. Thanks.

## 2011-06-28 ENCOUNTER — Telehealth: Payer: Self-pay | Admitting: Internal Medicine

## 2011-06-28 MED ORDER — PRAVASTATIN SODIUM 80 MG PO TABS: 80.0000 mg | ORAL_TABLET | Freq: Every day | ORAL | Status: DC

## 2011-06-28 MED ORDER — DULOXETINE HCL 30 MG PO CPEP: ORAL_CAPSULE | ORAL | Status: DC

## 2011-06-28 NOTE — Telephone Encounter (Signed)
Rx sent to phramacy and letter sent to pt to make a follow up appt.

## 2011-06-28 NOTE — Telephone Encounter (Signed)
Patient called stating that she need her prevastatin and cymbalta sent to [facility] on [address] ph [phone]. Please assist

## 2011-06-29 ENCOUNTER — Other Ambulatory Visit: Payer: Self-pay | Admitting: Dermatology

## 2011-07-04 LAB — HM MAMMOGRAPHY

## 2011-07-04 LAB — HM PAP SMEAR

## 2011-07-29 ENCOUNTER — Ambulatory Visit (INDEPENDENT_AMBULATORY_CARE_PROVIDER_SITE_OTHER): Payer: BC Managed Care – PPO | Admitting: Internal Medicine

## 2011-07-29 ENCOUNTER — Encounter: Payer: Self-pay | Admitting: Internal Medicine

## 2011-07-29 VITALS — BP 122/72 | HR 72 | Temp 97.9°F | Wt 198.0 lb

## 2011-07-29 DIAGNOSIS — F329 Major depressive disorder, single episode, unspecified: Secondary | ICD-10-CM

## 2011-07-29 DIAGNOSIS — M1990 Unspecified osteoarthritis, unspecified site: Secondary | ICD-10-CM

## 2011-07-29 DIAGNOSIS — G4733 Obstructive sleep apnea (adult) (pediatric): Secondary | ICD-10-CM

## 2011-07-29 DIAGNOSIS — Z299 Encounter for prophylactic measures, unspecified: Secondary | ICD-10-CM

## 2011-07-29 DIAGNOSIS — Z0000 Encounter for general adult medical examination without abnormal findings: Secondary | ICD-10-CM | POA: Insufficient documentation

## 2011-07-29 DIAGNOSIS — R7309 Other abnormal glucose: Secondary | ICD-10-CM

## 2011-07-29 DIAGNOSIS — E785 Hyperlipidemia, unspecified: Secondary | ICD-10-CM

## 2011-07-29 DIAGNOSIS — J310 Chronic rhinitis: Secondary | ICD-10-CM

## 2011-07-29 LAB — BASIC METABOLIC PANEL
BUN: 21 mg/dL (ref 6–23)
Chloride: 103 mEq/L (ref 96–112)
GFR: 74.59 mL/min (ref >60.00)
Potassium: 3.9 mEq/L (ref 3.5–5.1)
Sodium: 138 mEq/L (ref 135–145)

## 2011-07-29 LAB — CBC WITH DIFFERENTIAL/PLATELET
Basophils Relative: 1.4 % (ref 0.0–3.0)
Eosinophils Relative: 2.2 % (ref 0.0–5.0)
HCT: 39.5 % (ref 36.0–46.0)
Hemoglobin: 13.2 g/dL (ref 12.0–15.0)
Lymphs Abs: 2.1 10*3/uL (ref 0.7–4.0)
MCV: 89.3 fl (ref 78.0–100.0)
Monocytes Absolute: 0.6 10*3/uL (ref 0.1–1.0)
Monocytes Relative: 8.6 % (ref 3.0–12.0)
Neutro Abs: 4.4 10*3/uL (ref 1.4–7.7)
Platelets: 293 10*3/uL (ref 150.0–400.0)
RBC: 4.43 Mil/uL (ref 3.87–5.11)
WBC: 7.5 10*3/uL (ref 4.5–10.5)

## 2011-07-29 LAB — HEPATIC FUNCTION PANEL
ALT: 21 U/L (ref 0–35)
AST: 23 U/L (ref 0–37)
Albumin: 3.7 g/dL (ref 3.5–5.2)
Total Bilirubin: 0.4 mg/dL (ref 0.3–1.2)
Total Protein: 7 g/dL (ref 6.0–8.3)

## 2011-07-29 LAB — LIPID PANEL
Cholesterol: 225 mg/dL — ABNORMAL HIGH (ref 0–200)
Total CHOL/HDL Ratio: 5
Triglycerides: 122 mg/dL (ref 0.0–149.0)

## 2011-07-29 LAB — LDL CHOLESTEROL, DIRECT: Direct LDL: 163.5 mg/dL

## 2011-07-29 MED ORDER — PRAVASTATIN SODIUM 80 MG PO TABS: 80.0000 mg | ORAL_TABLET | Freq: Every day | ORAL | Status: DC

## 2011-07-29 MED ORDER — DULOXETINE HCL 30 MG PO CPEP: ORAL_CAPSULE | ORAL | Status: DC

## 2011-07-29 NOTE — Progress Notes (Signed)
Subjective:    Patient ID: Cindy [name], female    DOB: 1945/09/14, 66 y.o.   MRN: [id number]  HPI Patient comes in today for follow up of  multiple medical problems.  She has not been seen here in over a year. She's run out of her medications today. She separated and divorced from her husband in the fall and never got notification with the latter until recently about her medications and need for followup. She is now living near [facility] and asks if it's possible to transfer to that office. In regard to her health she has gained weight over the last year or probably related to other things but plans on getting back to exercising and eating better.  OSA   Using  Machine.  Lipids :pravachol  No se   Out of med   Last night. Cymbalta  :On 30 mg   Helps the pain. And also her mood Increase dose gets sedation  in the past. No major injuries but did twist her foot when she was on vacation.   Review of Systems No cp sob/. No syncope major changes in vision or hearing uses Glynase as needed for her rhinitis. No bruising bleeding.  Past history family history social history reviewed in the electronic medical record. She is employed full-time. Immunizations are up to date except considering Zostavax. Outpatient Prescriptions Prior to Visit  Medication Sig Dispense Refill  . fluticasone (FLONASE) 50 MCG/ACT nasal spray Use as directed- Pt needs to schedule a follow up appt before the next refill.  16 g  0  . DULoxetine (CYMBALTA) 30 MG capsule 1 daily. Pt needs to schedule a follow up appt  30 capsule  0  . pravastatin (PRAVACHOL) 80 MG tablet Take 1 tablet (80 mg total) by mouth daily.  30 tablet  0       Objective:   Physical Exam Wt Readings from Last 3 Encounters:  07/29/11 198 lb (89.812 kg)  07/20/10 192 lb 9.6 oz (87.363 kg)  05/04/10 183 lb (83.008 kg)   BP 122/72  Pulse 72  Temp(Src) 97.9 F (36.6 C) (Oral)  Wt 198 lb (89.812 kg)  SpO2 94% HEENT: Normocephalic ;atraumatic ,  Eyes;  PERRL, EOMs  Full, lids and conjunctiva clear,,Ears: no deformities, canals nl, TM landmarks normal, Nose: no deformity or discharge mild congestion Mouth : OP clear without lesion or edema . Neck: Supple without adenopathy or masses or bruits Chest:  Clear to A&P without wheezes rales or rhonchi CV:  S1-S2 no gallops or murmurs peripheral perfusion is normal Abdomen:  Sof,t normal bowel sounds without hepatosplenomegaly, no guarding rebound or masses no CVA tenderness No clubbing cyanosis or edema      Assessment & Plan:  Hyperlipidemia Check l abs today she did have some coffee with creamer and flavored sugar in it. OSA   Under rx  Weight gain  Life style   Intervention advised Osteoarthritis back pain Depression Both of the above seem to be helped a good bit by low dose Cymbalta 30 mg we will continue on this medication samples given so she does not run out and refill sent into her local pharmacy for 6 months worth. She is to implement lifestyle intervention for healthy weight this motivated to do this.  It is okay with this provider to have her transferred to a [facility] facility closer to her residence.If oked by that office.   If laboratory studies are okay she can followup appointment with them  to establish in 4-6 months. Contact us if she wants to get Zostavax after checking her insurance coverage.

## 2011-07-29 NOTE — Patient Instructions (Signed)
Will notify you  of labs when available. ok to transfer to [facility] if ok with doctors at that site.

## 2011-08-04 NOTE — Progress Notes (Signed)
Quick Note:  Pt aware ______ 

## 2011-10-05 ENCOUNTER — Encounter: Payer: Self-pay | Admitting: Family Medicine

## 2011-10-05 ENCOUNTER — Ambulatory Visit (INDEPENDENT_AMBULATORY_CARE_PROVIDER_SITE_OTHER): Payer: BC Managed Care – PPO | Admitting: Family Medicine

## 2011-10-05 VITALS — BP 121/78 | HR 73 | Temp 97.6°F | Ht 65.25 in | Wt 194.0 lb

## 2011-10-05 DIAGNOSIS — M25562 Pain in left knee: Secondary | ICD-10-CM

## 2011-10-05 DIAGNOSIS — E669 Obesity, unspecified: Secondary | ICD-10-CM | POA: Insufficient documentation

## 2011-10-05 DIAGNOSIS — Z23 Encounter for immunization: Secondary | ICD-10-CM

## 2011-10-05 DIAGNOSIS — M549 Dorsalgia, unspecified: Secondary | ICD-10-CM

## 2011-10-05 DIAGNOSIS — Z299 Encounter for prophylactic measures, unspecified: Secondary | ICD-10-CM

## 2011-10-05 DIAGNOSIS — E663 Overweight: Secondary | ICD-10-CM

## 2011-10-05 DIAGNOSIS — R7309 Other abnormal glucose: Secondary | ICD-10-CM

## 2011-10-05 DIAGNOSIS — I8390 Asymptomatic varicose veins of unspecified lower extremity: Secondary | ICD-10-CM

## 2011-10-05 DIAGNOSIS — G4730 Sleep apnea, unspecified: Secondary | ICD-10-CM

## 2011-10-05 DIAGNOSIS — E1169 Type 2 diabetes mellitus with other specified complication: Secondary | ICD-10-CM | POA: Insufficient documentation

## 2011-10-05 DIAGNOSIS — G4733 Obstructive sleep apnea (adult) (pediatric): Secondary | ICD-10-CM

## 2011-10-05 DIAGNOSIS — R739 Hyperglycemia, unspecified: Secondary | ICD-10-CM

## 2011-10-05 DIAGNOSIS — R030 Elevated blood-pressure reading, without diagnosis of hypertension: Secondary | ICD-10-CM

## 2011-10-05 DIAGNOSIS — M25569 Pain in unspecified knee: Secondary | ICD-10-CM

## 2011-10-05 DIAGNOSIS — E785 Hyperlipidemia, unspecified: Secondary | ICD-10-CM

## 2011-10-05 DIAGNOSIS — Z2911 Encounter for prophylactic immunotherapy for respiratory syncytial virus (RSV): Secondary | ICD-10-CM

## 2011-10-05 HISTORY — DX: Pain in left knee: M25.562

## 2011-10-05 HISTORY — DX: Overweight: E66.3

## 2011-10-05 HISTORY — DX: Obesity, unspecified: E66.9

## 2011-10-05 NOTE — Assessment & Plan Note (Deleted)
Using [employer] and her CPAP daily, encouraged to follow up with Pulmonology 

## 2011-10-05 NOTE — Assessment & Plan Note (Signed)
Given Zostavax today 

## 2011-10-05 NOTE — Progress Notes (Signed)
Patient ID: Cindy [name], female   DOB: 05/23/1945, 66 y.o.   MRN: [id number] Cindy [name] [id number] [birth year] 10/05/2011      Progress Note New Patient  Subjective  Chief Complaint  Chief Complaint  Patient presents with  . Establish Care    new patient- transfer from Dr Fabian Sharp  . Injections    shingles vaccine    HPI  Patient is a 66 year old Caucasian female who is in today to establish care. She's recently moved out of his direction transferring her care from a level of our practice. Overall she's in good health. She moved to [place] from [place] about 7 years ago and has since gone through a divorce. 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 Her biggest concern today are around allergies but she's just developed since she moved to [place]. She does not take medications regularly but does get relief when she takes Flonase as needed. She also notes frustration with weight gain. She reports it as a young child she was found to be hyperthyroid and was struggling with tremors and weight loss at that time. From age [age] she describes having some treatment she thinks for radioactive iodine and since then she's been essentially euthyroid and not on medications finally she complains of intermittent left lower extremity pain. Had a throbbing varicosity over the last week and also has chronic left knee pain that she sees Dr. Charlett Blake for. She's recently been diagnosed with sleep apnea and is using her CPAP machine regularly does find it helpful. She's been under a great deal of stress as her marriage has been unwinding but she feels she's tolerating it relatively well with low dose Cymbalta.  Past Medical History  Diagnosis Date  . Other and unspecified hyperlipidemia   . Depressive disorder, not elsewhere classified   . Osteoarthritis   . Sleep apnea      On CPAP  . History of fractured kneecap     right   . Fibroid   . Chicken pox 66 yrs old  .  Measles as a child  . Allergic state 02/01/2010    Qualifier: Diagnosis of  By: Fabian Sharp MD, Neta Mends   . Overweight 10/05/2011  . Allergy   . Thyroid disease     h/o hyperthyroid treated with radioactive iodine? from 16 to 20  . Hyperglycemia   . Back pain   . OA (osteoarthritis) of knee     left knee  . Varicose vein of leg   . Sleep apnea   . Knee pain, left 10/05/2011    Follows with Dr Marlene Lard    Past Surgical History  Procedure Date  . Back surgery     L5 discectomy, 2002. helpful  . Cpap   . Cesarean section 1987    Family History  Problem Relation Age of Onset  . Hyperlipidemia Mother   . Dementia Mother   . Hyperlipidemia Father   . Heart disease Father     bypass, CAD  . Hyperlipidemia Sister   . Diabetes Sister     type 2  . Dementia Maternal Grandmother     alzheimer  . Alcohol abuse Maternal Grandfather   . Diabetes Paternal Grandmother     type 2?    History   Social History  . Marital Status: Married    Spouse Name: N/A    Number of Children: N/A  . Years of Education: N/A   Occupational History  .  Not on file.   Social History Main Topics  . Smoking status: Never Smoker   . Smokeless tobacco: Never Used  . Alcohol Use: Yes     2 glasses of wine weekly  . Drug Use: No  . Sexually Active: Not Currently   Other Topics Concern  . Not on file   Social History Narrative   Recently divorced separated fall 2012 Now living with friend in [place] area .Never smokerRegular exerciseSleep 01/12/29 up at 6; job hours irreg     Current Outpatient Prescriptions on File Prior to Visit  Medication Sig Dispense Refill  . fluticasone (FLONASE) 50 MCG/ACT nasal spray Use as directed- Pt needs to schedule a follow up appt before the next refill.  16 g  0  . pravastatin (PRAVACHOL) 80 MG tablet Take 1 tablet (80 mg total) by mouth daily.  30 tablet  12  . DISCONTD: DULoxetine (CYMBALTA) 30 MG capsule 1 daily. Pt needs to schedule a follow up appt  30  capsule  6    Allergies  Allergen Reactions  . Amoxicillin   . Codeine   . Erythromycin   . Rosuvastatin     REACTION: myalgias  . Sulfonamide Derivatives     Review of Systems  Review of Systems  Constitutional: Negative for fever, chills and malaise/fatigue.  HENT: Negative for hearing loss, nosebleeds and congestion.   Eyes: Negative for discharge.  Respiratory: Negative for cough, sputum production, shortness of breath and wheezing.   Cardiovascular: Negative for chest pain, palpitations and leg swelling.  Gastrointestinal: Negative for heartburn, nausea, vomiting, abdominal pain, diarrhea, constipation and blood in stool.  Genitourinary: Negative for dysuria, urgency, frequency and hematuria.  Musculoskeletal: Negative for myalgias, back pain and falls.  Skin: Negative for rash.  Neurological: Negative for dizziness, tremors, sensory change, focal weakness, loss of consciousness, weakness and headaches.  Endo/Heme/Allergies: Negative for polydipsia. Does not bruise/bleed easily.  Psychiatric/Behavioral: Negative for depression and suicidal ideas. The patient is not nervous/anxious and does not have insomnia.     Objective  BP 121/78  Pulse 73  Temp 97.6 F (36.4 C) (Temporal)  Ht 5' 5.25" (1.657 m)  Wt 194 lb (87.998 kg)  BMI 32.04 kg/m2  SpO2 97%  Physical Exam  Physical Exam  Constitutional: She is oriented to person, place, and time and well-developed, well-nourished, and in no distress. No distress.  HENT:  Head: Normocephalic and atraumatic.  Right Ear: External ear normal.  Left Ear: External ear normal.  Nose: Nose normal.  Mouth/Throat: Oropharynx is clear and moist. No oropharyngeal exudate.  Eyes: Conjunctivae are normal. Pupils are equal, round, and reactive to light. Right eye exhibits no discharge. Left eye exhibits no discharge. No scleral icterus.  Neck: Normal range of motion. Neck supple. No thyromegaly present.  Cardiovascular: Normal rate,  regular rhythm, normal heart sounds and intact distal pulses.   No murmur heard. Pulmonary/Chest: Effort normal and breath sounds normal. No respiratory distress. She has no wheezes. She has no rales.  Abdominal: Soft. Bowel sounds are normal. She exhibits no distension and no mass. There is no tenderness.  Musculoskeletal: Normal range of motion. She exhibits no edema and no tenderness.       Varicose veins palpable b/l LE, not inflamed  Lymphadenopathy:    She has no cervical adenopathy.  Neurological: She is alert and oriented to person, place, and time. She has normal reflexes. No cranial nerve deficit. Coordination normal.  Skin: Skin is warm and dry. No  rash noted. She is not diaphoretic.  Psychiatric: Mood, memory and affect normal.       Assessment & Plan  INCREASED BLOOD PRESSURE increase  Back pain She reports this is infrequent and has had good relief with Cymbalta, no changes.  Varicose vein of leg Had a recent episode of what sound like superficial thrombophlebitis in her left leg below her knee, resolved but does have extensive varicosities, encouraged compression hose if veins become more symptomatic and consider refer to vein and vascular  Hyperglycemia hgba1c 6.5 recently. Avoid simple carbs and recheck later this month  Preventive measure Given Zostavax today  Overweight Given paper work on [employer] and encouraged to increase exercise and avoid trans fats  HYPERLIPIDEMIA Continue Pravastatin for now. Recheck lipid panel and start MegaRed krill oil caps. Avoid trans fats, incrase exercise  OBSTRUCTIVE SLEEP APNEA Using [facility] and her CPAP daily, encouraged to follow up with Pulmonology  Knee pain, left Likely early OA encouraged to stay as active as possible

## 2011-10-05 NOTE — Assessment & Plan Note (Signed)
She reports this is infrequent and has had good relief with Cymbalta, no changes.

## 2011-10-05 NOTE — Assessment & Plan Note (Signed)
Given paper work on [employer] and encouraged to increase exercise and avoid trans fats

## 2011-10-05 NOTE — Assessment & Plan Note (Signed)
Likely early OA encouraged to stay as active as possible

## 2011-10-05 NOTE — Assessment & Plan Note (Signed)
Using [facility] and her CPAP daily, encouraged to follow up with Pulmonology

## 2011-10-05 NOTE — Assessment & Plan Note (Signed)
Continue Pravastatin for now. Recheck lipid panel and start MegaRed krill oil caps. Avoid trans fats, incrase exercise

## 2011-10-05 NOTE — Assessment & Plan Note (Signed)
hgba1c 6.5 recently. Avoid simple carbs and recheck later this month

## 2011-10-05 NOTE — Assessment & Plan Note (Signed)
Had a recent episode of what sound like superficial thrombophlebitis in her left leg below her knee, resolved but does have extensive varicosities, encouraged compression hose if veins become more symptomatic and consider refer to vein and vascular

## 2011-10-05 NOTE — Assessment & Plan Note (Signed)
increase

## 2011-10-05 NOTE — Patient Instructions (Addendum)
Preventive Care for Adults, Female A healthy lifestyle and preventive care can promote health and wellness. Preventive health guidelines for women include the following key practices.  A routine yearly physical is a good way to check with your caregiver about your health and preventive screening. It is a chance to share any concerns and updates on your health, and to receive a thorough exam.   Visit your dentist for a routine exam and preventive care every 6 months. Brush your teeth twice a day and floss once a day. Good oral hygiene prevents tooth decay and gum disease.   The frequency of eye exams is based on your age, health, family medical history, use of contact lenses, and other factors. Follow your caregiver's recommendations for frequency of eye exams.   Eat a healthy diet. Foods like vegetables, fruits, whole grains, low-fat dairy products, and lean protein foods contain the nutrients you need without too many calories. Decrease your intake of foods high in solid fats, added sugars, and salt. Eat the right amount of calories for you.Get information about a proper diet from your caregiver, if necessary.   Regular physical exercise is one of the most important things you can do for your health. Most adults should get at least 150 minutes of moderate-intensity exercise (any activity that increases your heart rate and causes you to sweat) each week. In addition, most adults need muscle-strengthening exercises on 2 or more days a week.   Maintain a healthy weight. The body mass index (BMI) is a screening tool to identify possible weight problems. It provides an estimate of body fat based on height and weight. Your caregiver can help determine your BMI, and can help you achieve or maintain a healthy weight.For adults 20 years and older:   A BMI below 18.5 is considered underweight.   A BMI of 18.5 to 24.9 is normal.   A BMI of 25 to 29.9 is considered overweight.   A BMI of 30 and above is  considered obese.   Maintain normal blood lipids and cholesterol levels by exercising and minimizing your intake of saturated fat. Eat a balanced diet with plenty of fruit and vegetables. Blood tests for lipids and cholesterol should begin at age 20 and be repeated every 5 years. If your lipid or cholesterol levels are high, you are over 50, or you are at high risk for heart disease, you may need your cholesterol levels checked more frequently.Ongoing high lipid and cholesterol levels should be treated with medicines if diet and exercise are not effective.   If you smoke, find out from your caregiver how to quit. If you do not use tobacco, do not start.   If you are pregnant, do not drink alcohol. If you are breastfeeding, be very cautious about drinking alcohol. If you are not pregnant and choose to drink alcohol, do not exceed 1 drink per day. One drink is considered to be 12 ounces (355 mL) of beer, 5 ounces (148 mL) of wine, or 1.5 ounces (44 mL) of liquor.   Avoid use of street drugs. Do not share needles with anyone. Ask for help if you need support or instructions about stopping the use of drugs.   High blood pressure causes heart disease and increases the risk of stroke. Your blood pressure should be checked at least every 1 to 2 years. Ongoing high blood pressure should be treated with medicines if weight loss and exercise are not effective.   If you are 55 to 64   years old, ask your caregiver if you should take aspirin to prevent strokes.   Diabetes screening involves taking a blood sample to check your fasting blood sugar level. This should be done once every 3 years, after age 45, if you are within normal weight and without risk factors for diabetes. Testing should be considered at a younger age or be carried out more frequently if you are overweight and have at least 1 risk factor for diabetes.   Breast cancer screening is essential preventive care for women. You should practice "breast  self-awareness." This means understanding the normal appearance and feel of your breasts and may include breast self-examination. Any changes detected, no matter how small, should be reported to a caregiver. Women in their 20s and 30s should have a clinical breast exam (CBE) by a caregiver as part of a regular health exam every 1 to 3 years. After age 40, women should have a CBE every year. Starting at age 40, women should consider having a mammography (breast X-ray test) every year. Women who have a family history of breast cancer should talk to their caregiver about genetic screening. Women at a high risk of breast cancer should talk to their caregivers about having magnetic resonance imaging (MRI) and a mammography every year.   The Pap test is a screening test for cervical cancer. A Pap test can show cell changes on the cervix that might become cervical cancer if left untreated. A Pap test is a procedure in which cells are obtained and examined from the lower end of the uterus (cervix).   Women should have a Pap test starting at age 21.   Between ages 21 and 29, Pap tests should be repeated every 2 years.   Beginning at age 30, you should have a Pap test every 3 years as long as the past 3 Pap tests have been normal.   Some women have medical problems that increase the chance of getting cervical cancer. Talk to your caregiver about these problems. It is especially important to talk to your caregiver if a new problem develops soon after your last Pap test. In these cases, your caregiver may recommend more frequent screening and Pap tests.   The above recommendations are the same for women who have or have not gotten the vaccine for human papillomavirus (HPV).   If you had a hysterectomy for a problem that was not cancer or a condition that could lead to cancer, then you no longer need Pap tests. Even if you no longer need a Pap test, a regular exam is a good idea to make sure no other problems are  starting.   If you are between ages 65 and 70, and you have had normal Pap tests going back 10 years, you no longer need Pap tests. Even if you no longer need a Pap test, a regular exam is a good idea to make sure no other problems are starting.   If you have had past treatment for cervical cancer or a condition that could lead to cancer, you need Pap tests and screening for cancer for at least 20 years after your treatment.   If Pap tests have been discontinued, risk factors (such as a new sexual partner) need to be reassessed to determine if screening should be resumed.   The HPV test is an additional test that may be used for cervical cancer screening. The HPV test looks for the virus that can cause the cell changes on the cervix.   The cells collected during the Pap test can be tested for HPV. The HPV test could be used to screen women aged 30 years and older, and should be used in women of any age who have unclear Pap test results. After the age of 30, women should have HPV testing at the same frequency as a Pap test.   Colorectal cancer can be detected and often prevented. Most routine colorectal cancer screening begins at the age of 50 and continues through age 75. However, your caregiver may recommend screening at an earlier age if you have risk factors for colon cancer. On a yearly basis, your caregiver may provide home test kits to check for hidden blood in the stool. Use of a small camera at the end of a tube, to directly examine the colon (sigmoidoscopy or colonoscopy), can detect the earliest forms of colorectal cancer. Talk to your caregiver about this at age 50, when routine screening begins. Direct examination of the colon should be repeated every 5 to 10 years through age 75, unless early forms of pre-cancerous polyps or small growths are found.   Hepatitis C blood testing is recommended for all people born from 1945 through 1965 and any individual with known risks for hepatitis C.    Practice safe sex. Use condoms and avoid high-risk sexual practices to reduce the spread of sexually transmitted infections (STIs). STIs include gonorrhea, chlamydia, syphilis, trichomonas, herpes, HPV, and human immunodeficiency virus (HIV). Herpes, HIV, and HPV are viral illnesses that have no cure. They can result in disability, cancer, and death. Sexually active women aged 25 and younger should be checked for chlamydia. Older women with new or multiple partners should also be tested for chlamydia. Testing for other STIs is recommended if you are sexually active and at increased risk.   Osteoporosis is a disease in which the bones lose minerals and strength with aging. This can result in serious bone fractures. The risk of osteoporosis can be identified using a bone density scan. Women ages 65 and over and women at risk for fractures or osteoporosis should discuss screening with their caregivers. Ask your caregiver whether you should take a calcium supplement or vitamin D to reduce the rate of osteoporosis.   Menopause can be associated with physical symptoms and risks. Hormone replacement therapy is available to decrease symptoms and risks. You should talk to your caregiver about whether hormone replacement therapy is right for you.   Use sunscreen with sun protection factor (SPF) of 30 or more. Apply sunscreen liberally and repeatedly throughout the day. You should seek shade when your shadow is shorter than you. Protect yourself by wearing long sleeves, pants, a wide-brimmed hat, and sunglasses year round, whenever you are outdoors.   Once a month, do a whole body skin exam, using a mirror to look at the skin on your back. Notify your caregiver of new moles, moles that have irregular borders, moles that are larger than a pencil eraser, or moles that have changed in shape or color.   Stay current with required immunizations.   Influenza. You need a dose every fall (or winter). The composition of  the flu vaccine changes each year, so being vaccinated once is not enough.   Pneumococcal polysaccharide. You need 1 to 2 doses if you smoke cigarettes or if you have certain chronic medical conditions. You need 1 dose at age 65 (or older) if you have never been vaccinated.   Tetanus, diphtheria, pertussis (Tdap, Td). Get 1 dose of   Tdap vaccine if you are younger than age 65, are over 65 and have contact with an infant, are a healthcare worker, are pregnant, or simply want to be protected from whooping cough. After that, you need a Td booster dose every 10 years. Consult your caregiver if you have not had at least 3 tetanus and diphtheria-containing shots sometime in your life or have a deep or dirty wound.   HPV. You need this vaccine if you are a woman age 26 or younger. The vaccine is given in 3 doses over 6 months.   Measles, mumps, rubella (MMR). You need at least 1 dose of MMR if you were born in 1957 or later. You may also need a second dose.   Meningococcal. If you are age 19 to 21 and a first-year college student living in a residence hall, or have one of several medical conditions, you need to get vaccinated against meningococcal disease. You may also need additional booster doses.   Zoster (shingles). If you are age 60 or older, you should get this vaccine.   Varicella (chickenpox). If you have never had chickenpox or you were vaccinated but received only 1 dose, talk to your caregiver to find out if you need this vaccine.   Hepatitis A. You need this vaccine if you have a specific risk factor for hepatitis A virus infection or you simply wish to be protected from this disease. The vaccine is usually given as 2 doses, 6 to 18 months apart.   Hepatitis B. You need this vaccine if you have a specific risk factor for hepatitis B virus infection or you simply wish to be protected from this disease. The vaccine is given in 3 doses, usually over 6 months.  Preventive Services /  Frequency Ages 19 to 39  Blood pressure check.** / Every 1 to 2 years.   Lipid and cholesterol check.** / Every 5 years beginning at age 20.   Clinical breast exam.** / Every 3 years for women in their 20s and 30s.   Pap test.** / Every 2 years from ages 21 through 29. Every 3 years starting at age 30 through age 65 or 70 with a history of 3 consecutive normal Pap tests.   HPV screening.** / Every 3 years from ages 30 through ages 65 to 70 with a history of 3 consecutive normal Pap tests.   Hepatitis C blood test.** / For any individual with known risks for hepatitis C.   Skin self-exam. / Monthly.   Influenza immunization.** / Every year.   Pneumococcal polysaccharide immunization.** / 1 to 2 doses if you smoke cigarettes or if you have certain chronic medical conditions.   Tetanus, diphtheria, pertussis (Tdap, Td) immunization. / A one-time dose of Tdap vaccine. After that, you need a Td booster dose every 10 years.   HPV immunization. / 3 doses over 6 months, if you are 26 and younger.   Measles, mumps, rubella (MMR) immunization. / You need at least 1 dose of MMR if you were born in 1957 or later. You may also need a second dose.   Meningococcal immunization. / 1 dose if you are age 19 to 21 and a first-year college student living in a residence hall, or have one of several medical conditions, you need to get vaccinated against meningococcal disease. You may also need additional booster doses.   Varicella immunization.** / Consult your caregiver.   Hepatitis A immunization.** / Consult your caregiver. 2 doses, 6 to 18 months   apart.   Hepatitis B immunization.** / Consult your caregiver. 3 doses usually over 6 months.  Ages 40 to 64  Blood pressure check.** / Every 1 to 2 years.   Lipid and cholesterol check.** / Every 5 years beginning at age 20.   Clinical breast exam.** / Every year after age 40.   Mammogram.** / Every year beginning at age 40 and continuing for as  long as you are in good health. Consult with your caregiver.   Pap test.** / Every 3 years starting at age 30 through age 65 or 70 with a history of 3 consecutive normal Pap tests.   HPV screening.** / Every 3 years from ages 30 through ages 65 to 70 with a history of 3 consecutive normal Pap tests.   Fecal occult blood test (FOBT) of stool. / Every year beginning at age 50 and continuing until age 75. You may not need to do this test if you get a colonoscopy every 10 years.   Flexible sigmoidoscopy or colonoscopy.** / Every 5 years for a flexible sigmoidoscopy or every 10 years for a colonoscopy beginning at age 50 and continuing until age 75.   Hepatitis C blood test.** / For all people born from 1945 through 1965 and any individual with known risks for hepatitis C.   Skin self-exam. / Monthly.   Influenza immunization.** / Every year.   Pneumococcal polysaccharide immunization.** / 1 to 2 doses if you smoke cigarettes or if you have certain chronic medical conditions.   Tetanus, diphtheria, pertussis (Tdap, Td) immunization.** / A one-time dose of Tdap vaccine. After that, you need a Td booster dose every 10 years.   Measles, mumps, rubella (MMR) immunization. / You need at least 1 dose of MMR if you were born in 1957 or later. You may also need a second dose.   Varicella immunization.** / Consult your caregiver.   Meningococcal immunization.** / Consult your caregiver.   Hepatitis A immunization.** / Consult your caregiver. 2 doses, 6 to 18 months apart.   Hepatitis B immunization.** / Consult your caregiver. 3 doses, usually over 6 months.  Ages 65 and over  Blood pressure check.** / Every 1 to 2 years.   Lipid and cholesterol check.** / Every 5 years beginning at age 20.   Clinical breast exam.** / Every year after age 40.   Mammogram.** / Every year beginning at age 40 and continuing for as long as you are in good health. Consult with your caregiver.   Pap test.** /  Every 3 years starting at age 30 through age 65 or 70 with a 3 consecutive normal Pap tests. Testing can be stopped between 65 and 70 with 3 consecutive normal Pap tests and no abnormal Pap or HPV tests in the past 10 years.   HPV screening.** / Every 3 years from ages 30 through ages 65 or 70 with a history of 3 consecutive normal Pap tests. Testing can be stopped between 65 and 70 with 3 consecutive normal Pap tests and no abnormal Pap or HPV tests in the past 10 years.   Fecal occult blood test (FOBT) of stool. / Every year beginning at age 50 and continuing until age 75. You may not need to do this test if you get a colonoscopy every 10 years.   Flexible sigmoidoscopy or colonoscopy.** / Every 5 years for a flexible sigmoidoscopy or every 10 years for a colonoscopy beginning at age 50 and continuing until age 75.   Hepatitis   C blood test.** / For all people born from [birth year] through 1965 and any individual with known risks for hepatitis C.   Osteoporosis screening.** / A one-time screening for women ages 51 and over and women at risk for fractures or osteoporosis.   Skin self-exam. / Monthly.   Influenza immunization.** / Every year.   Pneumococcal polysaccharide immunization.** / 1 dose at age 5 (or older) if you have never been vaccinated.   Tetanus, diphtheria, pertussis (Tdap, Td) immunization. / A one-time dose of Tdap vaccine if you are over 65 and have contact with an infant, are a Research scientist (physical sciences), or simply want to be protected from whooping cough. After that, you need a Td booster dose every 10 years.   Varicella immunization.** / Consult your caregiver.   Meningococcal immunization.** / Consult your caregiver.   Hepatitis A immunization.** / Consult your caregiver. 2 doses, 6 to 18 months apart.   Hepatitis B immunization.** / Check with your caregiver. 3 doses, usually over 6 months.  ** Family history and personal history of risk and conditions may change your caregiver's  recommendations. Document Released: 05/17/2001 Document Revised: 03/10/2011 Document Reviewed: 08/16/2010 [facility] Patient Information 2012 [place], [place].  Start MegaRed cap daily by [employer]

## 2011-10-25 ENCOUNTER — Other Ambulatory Visit: Payer: Self-pay | Admitting: Family Medicine

## 2011-10-25 DIAGNOSIS — Z1231 Encounter for screening mammogram for malignant neoplasm of breast: Secondary | ICD-10-CM

## 2011-10-27 ENCOUNTER — Other Ambulatory Visit: Payer: BC Managed Care – PPO

## 2011-10-28 ENCOUNTER — Ambulatory Visit (HOSPITAL_COMMUNITY)
Admission: RE | Admit: 2011-10-28 | Discharge: 2011-10-28 | Disposition: A | Discharge status: Home / Self Care | Payer: BC Managed Care – PPO | Source: Ambulatory Visit | Attending: Family Medicine | Admitting: Family Medicine

## 2011-10-28 DIAGNOSIS — Z1231 Encounter for screening mammogram for malignant neoplasm of breast: Secondary | ICD-10-CM

## 2011-11-10 ENCOUNTER — Other Ambulatory Visit (INDEPENDENT_AMBULATORY_CARE_PROVIDER_SITE_OTHER): Payer: BC Managed Care – PPO

## 2011-11-10 DIAGNOSIS — E785 Hyperlipidemia, unspecified: Secondary | ICD-10-CM

## 2011-11-10 DIAGNOSIS — R7309 Other abnormal glucose: Secondary | ICD-10-CM

## 2011-11-10 DIAGNOSIS — R030 Elevated blood-pressure reading, without diagnosis of hypertension: Secondary | ICD-10-CM

## 2011-11-10 LAB — CBC
HCT: 39.5 % (ref 36.0–46.0)
Hemoglobin: 12.9 g/dL (ref 12.0–15.0)
Platelets: 296 10*3/uL (ref 150.0–400.0)
WBC: 6.7 10*3/uL (ref 4.5–10.5)

## 2011-11-10 LAB — TSH: TSH: 1.2 u[IU]/mL (ref 0.35–5.50)

## 2011-11-10 LAB — T3, FREE: T3, Free: 2.9 pg/mL (ref 2.3–4.2)

## 2011-11-10 LAB — HEPATIC FUNCTION PANEL
ALT: 17 U/L (ref 0–35)
Albumin: 3.8 g/dL (ref 3.5–5.2)
Bilirubin, Direct: 0 mg/dL (ref 0.0–0.3)
Total Protein: 6.9 g/dL (ref 6.0–8.3)

## 2011-11-10 LAB — LIPID PANEL
Total CHOL/HDL Ratio: 4
Triglycerides: 116 mg/dL (ref 0.0–149.0)

## 2011-11-10 LAB — RENAL FUNCTION PANEL
Albumin: 3.8 g/dL (ref 3.5–5.2)
BUN: 21 mg/dL (ref 6–23)
GFR: 70.54 mL/min (ref >60.00)
Glucose, Bld: 104 mg/dL — ABNORMAL HIGH (ref 70–99)
Phosphorus: 3.3 mg/dL (ref 2.3–4.6)
Potassium: 4.6 mEq/L (ref 3.5–5.1)

## 2011-11-16 ENCOUNTER — Ambulatory Visit (INDEPENDENT_AMBULATORY_CARE_PROVIDER_SITE_OTHER): Payer: BC Managed Care – PPO | Admitting: Family Medicine

## 2011-11-16 ENCOUNTER — Encounter: Payer: Self-pay | Admitting: Family Medicine

## 2011-11-16 VITALS — BP 117/77 | HR 68 | Temp 97.0°F | Ht 65.25 in | Wt 193.0 lb

## 2011-11-16 DIAGNOSIS — R739 Hyperglycemia, unspecified: Secondary | ICD-10-CM

## 2011-11-16 DIAGNOSIS — M25562 Pain in left knee: Secondary | ICD-10-CM

## 2011-11-16 DIAGNOSIS — F329 Major depressive disorder, single episode, unspecified: Secondary | ICD-10-CM

## 2011-11-16 DIAGNOSIS — F32A Depression, unspecified: Secondary | ICD-10-CM

## 2011-11-16 DIAGNOSIS — R7309 Other abnormal glucose: Secondary | ICD-10-CM

## 2011-11-16 DIAGNOSIS — J329 Chronic sinusitis, unspecified: Secondary | ICD-10-CM

## 2011-11-16 DIAGNOSIS — M25569 Pain in unspecified knee: Secondary | ICD-10-CM

## 2011-11-16 DIAGNOSIS — E785 Hyperlipidemia, unspecified: Secondary | ICD-10-CM

## 2011-11-16 DIAGNOSIS — R030 Elevated blood-pressure reading, without diagnosis of hypertension: Secondary | ICD-10-CM

## 2011-11-16 DIAGNOSIS — T7840XA Allergy, unspecified, initial encounter: Secondary | ICD-10-CM

## 2011-11-16 MED ORDER — LORATADINE 10 MG PO TABS: 10.0000 mg | ORAL_TABLET | Freq: Every day | ORAL | Status: DC | PRN

## 2011-11-16 MED ORDER — FLUTICASONE PROPIONATE 50 MCG/ACT NA SUSP: NASAL | Status: DC

## 2011-11-16 MED ORDER — GUAIFENESIN ER 600 MG PO TB12: 600.0000 mg | ORAL_TABLET | Freq: Two times a day (BID) | ORAL | Status: DC

## 2011-11-16 MED ORDER — AZITHROMYCIN 250 MG PO TABS: ORAL_TABLET | ORAL | Status: AC

## 2011-11-16 MED ORDER — PRAVASTATIN SODIUM 80 MG PO TABS: 80.0000 mg | ORAL_TABLET | Freq: Every day | ORAL | Status: DC

## 2011-11-16 MED ORDER — DULOXETINE HCL 30 MG PO CPEP: 30.0000 mg | ORAL_CAPSULE | Freq: Every day | ORAL | Status: DC

## 2011-11-16 MED ORDER — HYDROCOD POLST-CHLORPHEN POLST 10-8 MG/5ML PO LQCR: 5.0000 mL | Freq: Two times a day (BID) | ORAL | Status: DC | PRN

## 2011-11-16 NOTE — Assessment & Plan Note (Signed)
Encouraged to use her Claritin and Flonase daily for at least the next month

## 2011-11-16 NOTE — Assessment & Plan Note (Signed)
Tolerating Pravastatin, avoid trans fats and continue Krill oil caps

## 2011-11-16 NOTE — Assessment & Plan Note (Signed)
Started on antibiotics and Mucinex, push clear fluids, increase rest and start a probiotic, she is given an rx for Tussionex to help with the cough if it does not improve

## 2011-11-16 NOTE — Assessment & Plan Note (Signed)
Controlled well today, no changes

## 2011-11-16 NOTE — Assessment & Plan Note (Signed)
[id number] stable at 6.5 avoid simple carbs and increase exercise

## 2011-11-16 NOTE — Progress Notes (Signed)
Patient ID: Cindy [name], female   DOB: September 20, 1945, 66 y.o.   MRN: [id number] Cindy [name] [id number] [birth year] 11/16/2011      Progress Note-Follow Up  Subjective  Chief Complaint  Chief Complaint  Patient presents with  . Sinusitis    X 3 days, nasal drip- clear, sinus pressure when you touch, scratchy throat, fatigue    HPI  Is a 66 year old Caucasian female who is in today for followup and complaining of several days of worsening congestion. She is struggling with a sore throat, malaise, myalgias, fatigue as well as postnasal drip and a cough is bad enough to cause some posttussive vomiting times. She has had worsening symptoms in the last 24 hours. She is nasal congestion productive of clear phlegm at times. Denies headache or ear pain. Throat is irritated but not truly painful. No chest pain, palpitations, shortness of breath, GU complaints. She has had some mild loose stool but this is more related to supplements. Her left knee pain is improved since starting [employer] caps, she has been using her allergy meds intermittently  Past Medical History  Diagnosis Date  . Other and unspecified hyperlipidemia   . Depressive disorder, not elsewhere classified   . Osteoarthritis   . Sleep apnea      On CPAP  . History of fractured kneecap     right   . Fibroid   . Chicken pox 66 yrs old  . Measles as a child  . Allergic state 02/01/2010    Qualifier: Diagnosis of  By: Fabian Sharp MD, Neta Mends   . Overweight 10/05/2011  . Allergy   . Thyroid disease     h/o hyperthyroid treated with radioactive iodine? from 16 to 20  . Hyperglycemia   . Back pain   . OA (osteoarthritis) of knee     left knee  . Varicose vein of leg   . Sleep apnea   . Knee pain, left 10/05/2011    Follows with Dr Marlene Lard    Past Surgical History  Procedure Date  . Back surgery     L5 discectomy, 2002. helpful  . Cpap   . Cesarean section 1987    Family History  Problem Relation Age of Onset  .  Hyperlipidemia Mother   . Dementia Mother   . Hyperlipidemia Father   . Heart disease Father     bypass, CAD  . Hyperlipidemia Sister   . Diabetes Sister     type 2  . Dementia Maternal Grandmother     alzheimer  . Alcohol abuse Maternal Grandfather   . Diabetes Paternal Grandmother     type 2?    History   Social History  . Marital Status: Married    Spouse Name: N/A    Number of Children: N/A  . Years of Education: N/A   Occupational History  . Not on file.   Social History Main Topics  . Smoking status: Never Smoker   . Smokeless tobacco: Never Used  . Alcohol Use: Yes     2 glasses of wine weekly  . Drug Use: No  . Sexually Active: Not Currently   Other Topics Concern  . Not on file   Social History Narrative   Recently divorced separated fall 2012 Now living with friend in [place] area .Never smokerRegular exerciseSleep 01/12/29 up at 6; job hours irreg     Current Outpatient Prescriptions on File Prior to Visit  Medication Sig Dispense Refill  . DISCONTD: DULoxetine (  CYMBALTA) 30 MG capsule 1 daily      . DISCONTD: fluticasone (FLONASE) 50 MCG/ACT nasal spray Use as directed- Pt needs to schedule a follow up appt before the next refill.  16 g  0  . DISCONTD: pravastatin (PRAVACHOL) 80 MG tablet Take 1 tablet (80 mg total) by mouth daily.  30 tablet  12  . loratadine (CLARITIN) 10 MG tablet Take 1 tablet (10 mg total) by mouth daily as needed for allergies.  30 tablet  5    Allergies  Allergen Reactions  . Amoxicillin   . Codeine   . Erythromycin   . Rosuvastatin     REACTION: myalgias  . Sulfonamide Derivatives     Review of Systems  Review of Systems  Constitutional: Positive for malaise/fatigue. Negative for fever.  HENT: Positive for congestion and sore throat.   Eyes: Negative for discharge.  Respiratory: Positive for cough and sputum production. Negative for shortness of breath.   Cardiovascular: Negative for chest pain, palpitations and  leg swelling.  Gastrointestinal: Positive for nausea and vomiting. Negative for abdominal pain and diarrhea.  Genitourinary: Negative for dysuria.  Musculoskeletal: Negative for falls.  Skin: Negative for rash.  Neurological: Negative for loss of consciousness and headaches.  Endo/Heme/Allergies: Negative for polydipsia.  Psychiatric/Behavioral: Negative for depression and suicidal ideas. The patient is not nervous/anxious and does not have insomnia.     Objective  BP 117/77  Pulse 68  Temp 97 F (36.1 C) (Temporal)  Ht 5' 5.25" (1.657 m)  Wt 193 lb (87.544 kg)  BMI 31.87 kg/m2  SpO2 96%  Physical Exam  Physical Exam  Constitutional: She is oriented to person, place, and time and well-developed, well-nourished, and in no distress. No distress.  HENT:  Head: Normocephalic and atraumatic.       Nasal mucosa boggy and erythematous  Eyes: Conjunctivae are normal.  Neck: Neck supple. No thyromegaly present.  Cardiovascular: Normal rate, regular rhythm and normal heart sounds.   No murmur heard. Pulmonary/Chest: Effort normal and breath sounds normal. She has no wheezes.  Abdominal: She exhibits no distension and no mass.  Musculoskeletal: She exhibits no edema.  Lymphadenopathy:    She has cervical adenopathy.  Neurological: She is alert and oriented to person, place, and time.  Skin: Skin is warm and dry. No rash noted. She is not diaphoretic.  Psychiatric: Memory, affect and judgment normal.    Lab Results  Component Value Date   TSH 1.20 11/10/2011   Lab Results  Component Value Date   WBC 6.7 11/10/2011   HGB 12.9 11/10/2011   HCT 39.5 11/10/2011   MCV 90.4 11/10/2011   PLT 296.0 11/10/2011   Lab Results  Component Value Date   CREATININE 0.9 11/10/2011   BUN 21 11/10/2011   NA 139 11/10/2011   K 4.6 11/10/2011   CL 103 11/10/2011   CO2 30 11/10/2011   Lab Results  Component Value Date   ALT 17 11/10/2011   AST 19 11/10/2011   ALKPHOS 72 11/10/2011   BILITOT 0.8 11/10/2011   Lab  Results  Component Value Date   CHOL 199 11/10/2011   Lab Results  Component Value Date   HDL 44.30 11/10/2011   Lab Results  Component Value Date   LDLCALC 132* 11/10/2011   Lab Results  Component Value Date   TRIG 116.0 11/10/2011   Lab Results  Component Value Date   CHOLHDL 4 11/10/2011     Assessment & Plan  Sinusitis Started  on antibiotics and Mucinex, push clear fluids, increase rest and start a probiotic, she is given an rx for Tussionex to help with the cough if it does not improve  Allergic state Encouraged to use her Claritin and Flonase daily for at least the next month  Hyperglycemia hgba1c stable at 6.5 avoid simple carbs and increase exercise  INCREASED BLOOD PRESSURE Controlled well today, no changes  HYPERLIPIDEMIA Tolerating Pravastatin, avoid trans fats and continue Krill oil caps  Knee pain, left She feels her knee has improved some since starting the [employer] caps

## 2011-11-16 NOTE — Patient Instructions (Addendum)

## 2011-11-16 NOTE — Assessment & Plan Note (Signed)
She feels her knee has improved some since starting the [employer] caps

## 2012-03-13 ENCOUNTER — Encounter: Payer: Self-pay | Admitting: Pulmonary Disease

## 2012-03-13 ENCOUNTER — Ambulatory Visit (INDEPENDENT_AMBULATORY_CARE_PROVIDER_SITE_OTHER): Payer: BC Managed Care – PPO | Admitting: Pulmonary Disease

## 2012-03-13 VITALS — BP 142/78 | HR 80 | Temp 98.2°F | Ht 65.0 in | Wt 189.0 lb

## 2012-03-13 DIAGNOSIS — G4733 Obstructive sleep apnea (adult) (pediatric): Secondary | ICD-10-CM

## 2012-03-13 NOTE — Progress Notes (Signed)
Chief Complaint  Patient presents with  . Follow-up    Pt reports sleeping well, wearing CPAP approc 6-8 hrs per night--questioning if dry eyes is related to OSA --resevoir is broken onmachine needs to be replaced    History of Present Illness: Cindy [name] is a 66 y.o. female with OSA.  She uses her CPAP every night.  The pressure setting feels adequate.  She has a nasal mask, and this fits well.  Her download shows > 7 hrs use per night.  She feels rested during the day.  Her main difficulty is related to nasal dryness.  Her humidifier was broke by airport security several months ago.  With change in weather she has noticed more trouble with dryness.  TESTS: PSG 10/21/05>>AHI 58   Past Medical History  Diagnosis Date  . Other and unspecified hyperlipidemia   . Depressive disorder, not elsewhere classified   . Osteoarthritis   . Sleep apnea      On CPAP  . History of fractured kneecap     right   . Fibroid   . Chicken pox 66 yrs old  . Measles as a child  . Allergic state 02/01/2010    Qualifier: Diagnosis of  By: Fabian Sharp MD, Neta Mends   . Overweight 10/05/2011  . Allergy   . Thyroid disease     h/o hyperthyroid treated with radioactive iodine? from 16 to 20  . Hyperglycemia   . Back pain   . OA (osteoarthritis) of knee     left knee  . Varicose vein of leg   . Sleep apnea   . Knee pain, left 10/05/2011    Follows with Dr Marlene Lard    Past Surgical History  Procedure Date  . Back surgery     L5 discectomy, 2002. helpful  . Cpap   . Cesarean section 1987    Outpatient Encounter Prescriptions as of 03/13/2012  Medication Sig Dispense Refill  . chlorpheniramine-HYDROcodone (TUSSIONEX PENNKINETIC ER) 10-8 MG/5ML LQCR Take 5 mLs by mouth every 12 (twelve) hours as needed. Cough, may cause sedation  140 mL  1  . cycloSPORINE (RESTASIS) 0.05 % ophthalmic emulsion Place 1 drop into both eyes 2 (two) times daily.      . DULoxetine (CYMBALTA) 30 MG capsule Take 1 capsule (30 mg  total) by mouth daily. 1 daily  90 capsule  1  . fish oil-omega-3 fatty acids 1000 MG capsule Take 2 g by mouth daily.      . fluticasone (FLONASE) 50 MCG/ACT nasal spray Use as directed- Pt needs to schedule a follow up appt before the next refill.  48 g  1  . guaiFENesin (MUCINEX) 600 MG 12 hr tablet Take 1 tablet (600 mg total) by mouth 2 (two) times daily.      Marland Kitchen loratadine (CLARITIN) 10 MG tablet Take 1 tablet (10 mg total) by mouth daily as needed for allergies.  30 tablet  5  . Olopatadine HCl (PATADAY) 0.2 % SOLN Apply 1 drop to eye daily.      . pravastatin (PRAVACHOL) 80 MG tablet Take 1 tablet (80 mg total) by mouth daily.  90 tablet  1    Allergies  Allergen Reactions  . Amoxicillin   . Codeine   . Erythromycin   . Rosuvastatin     REACTION: myalgias  . Sulfonamide Derivatives     Physical Exam:  Filed Vitals:   03/13/12 1557  BP: 142/78  Pulse: 80  Temp: 98.2 F (36.8  C)  TempSrc: Oral  Height: 5\' 5"  (1.651 m)  Weight: 189 lb (85.73 kg)  SpO2: 92%     Current Encounter SPO2  03/13/12 1557 92%  11/16/11 1006 96%  10/05/11 0931 97%     Body mass index is 31.45 kg/(m^2).   Wt Readings from Last 2 Encounters:  03/13/12 189 lb (85.73 kg)  11/16/11 193 lb (87.544 kg)     General - No distress ENT - No sinus tenderness, no oral exudate, no LAN Cardiac - s1s2 regular, no murmur Chest - No wheeze/rales/dullness Back - No focal tenderness Abd - Soft, non-tender Ext - No edema Neuro - Normal strength Skin - No rashes Psych - normal mood, and behavior   Assessment/Plan:  Coralyn Helling, MD [facility] Pager:  [phone] 03/13/2012, 4:06 PM

## 2012-03-13 NOTE — Patient Instructions (Signed)
Will arrange for new CPAP humidifier Follow up in one year

## 2012-03-13 NOTE — Assessment & Plan Note (Signed)
She reports benefit from CPAP, and download shows average of > 7 hrs per night.  Her main difficulty is related to not having heated humidity for her CPAP anymore.  Will have her DME arrange for new humidifier for CPAP.    She is reluctant to have scheduled follow up with pulmonary.  Advised she could d/w her PCP about whether she can follow up her CPAP renewal through PCP.

## 2012-05-07 ENCOUNTER — Ambulatory Visit (INDEPENDENT_AMBULATORY_CARE_PROVIDER_SITE_OTHER): Payer: BC Managed Care – PPO | Admitting: Family Medicine

## 2012-05-07 ENCOUNTER — Encounter: Payer: Self-pay | Admitting: Family Medicine

## 2012-05-07 VITALS — BP 135/77 | HR 66 | Temp 99.6°F | Ht 65.25 in | Wt 194.0 lb

## 2012-05-07 DIAGNOSIS — J069 Acute upper respiratory infection, unspecified: Secondary | ICD-10-CM

## 2012-05-07 NOTE — Assessment & Plan Note (Addendum)
Self-limited nature of this illness was discussed, questions answered.  Discussed symptomatic care, rest, fluids.   Warning signs/symptoms of worsening illness were discussed.  Patient instructed to call or return if any of these occur. OTC generic Afrin (oxymetazolone) nasal spray, 2 sprays each nostril about 30 min before bedtime. Saline nasal spray, 2-3 sprays each nostril 3 times per day. Continue flonase once daily and take full adult doses of either ibuprofen or acetaminophen for pain/headache/ST. Call or return if not improving at 6-7 days of current illness OR if worsening prior to this (fever, SOB, wheezing).

## 2012-05-07 NOTE — Progress Notes (Signed)
OFFICE NOTE  05/07/2012  CC:  Chief Complaint  Patient presents with  . Sinusitis    sinus pressure/pain, chills, nasal congestion, scratchy throat since Saturday, not getting better     HPI: Patient is a 67 y.o. Caucasian female who is here for respiratory complaints. Pt presents complaining of respiratory symptoms for 3 days.  Primary symptoms are: fullness/pressure in face, scratchy throat, no fever.  Worst symptoms seems to be the PND/fatigue.  Lately the symptoms seem to be staying same. Pertinent negatives: No fevers, no wheezing, and no SOB.  No pain in face or teeth.  No significant HA.  ST mild at most.   Symptoms made worse by nothing.  Symptoms improved by nothing. Smoker? not Recent sick contact? None known Muscle or joint aches? no Flu shot this season at least 2 wks ago? no  Additional ROS: no n/v/d or abdominal pain.  No rash.  No neck stiffness.   +Mild fatigue.  +Mild appetite loss.   Pertinent PMH:  Past Medical History  Diagnosis Date  . Other and unspecified hyperlipidemia   . Depressive disorder, not elsewhere classified   . Osteoarthritis   . Sleep apnea      On CPAP  . History of fractured kneecap     right   . Fibroid   . Chicken pox 67 yrs old  . Measles as a child  . Allergic state 02/01/2010    Qualifier: Diagnosis of  By: Fabian Sharp MD, Neta Mends   . Overweight 10/05/2011  . Allergy   . Thyroid disease     h/o hyperthyroid treated with radioactive iodine? from 16 to 20  . Hyperglycemia   . Back pain   . OA (osteoarthritis) of knee     left knee  . Varicose vein of leg   . Sleep apnea   . Knee pain, left 10/05/2011    Follows with Dr Marlene Lard   Past Surgical History  Procedure Date  . Back surgery     L5 discectomy, 2002. helpful  . Cpap   . Cesarean section 1987    MEDS:  Outpatient Prescriptions Prior to Visit  Medication Sig Dispense Refill  . DULoxetine (CYMBALTA) 30 MG capsule Take 1 capsule (30 mg total) by mouth daily. 1 daily  90  capsule  1  . fish oil-omega-3 fatty acids 1000 MG capsule Take 2 g by mouth daily.      . fluticasone (FLONASE) 50 MCG/ACT nasal spray Use as directed- Pt needs to schedule a follow up appt before the next refill.  48 g  1  . loratadine (CLARITIN) 10 MG tablet Take 1 tablet (10 mg total) by mouth daily as needed for allergies.  30 tablet  5  . pravastatin (PRAVACHOL) 80 MG tablet Take 1 tablet (80 mg total) by mouth daily.  90 tablet  1  . [DISCONTINUED] chlorpheniramine-HYDROcodone (TUSSIONEX PENNKINETIC ER) 10-8 MG/5ML LQCR Take 5 mLs by mouth every 12 (twelve) hours as needed. Cough, may cause sedation  140 mL  1  . [DISCONTINUED] cycloSPORINE (RESTASIS) 0.05 % ophthalmic emulsion Place 1 drop into both eyes 2 (two) times daily.      . [DISCONTINUED] guaiFENesin (MUCINEX) 600 MG 12 hr tablet Take 1 tablet (600 mg total) by mouth 2 (two) times daily.      . [DISCONTINUED] Olopatadine HCl (PATADAY) 0.2 % SOLN Apply 1 drop to eye daily.       Last reviewed on 05/07/2012  3:51 PM by Jeoffrey Massed,  MD  PE: Blood pressure 135/77, pulse 66, temperature 99.6 F (37.6 C), temperature source Temporal, height 5' 5.25" (1.657 m), weight 194 lb (87.998 kg), SpO2 96.00%. VS: noted--normal. Gen: alert, NAD, NONTOXIC APPEARING. HEENT: eyes without injection, drainage, or swelling.  Ears: EACs clear, TMs with normal light reflex and landmarks.  Nose: Clear rhinorrhea, with some dried, crusty exudate adherent to mildly injected mucosa.  No purulent d/c.  No paranasal sinus TTP.  No facial swelling.  Throat and mouth without focal lesion.  No pharyngial swelling, erythema, or exudate.   Neck: supple, no LAD.   LUNGS: CTA bilat, nonlabored resps.   CV: RRR, no m/r/g. EXT: no c/c/e SKIN: no rash    IMPRESSION AND PLAN:  Viral URI Self-limited nature of this illness was discussed, questions answered.  Discussed symptomatic care, rest, fluids.   Warning signs/symptoms of worsening illness were discussed.   Patient instructed to call or return if any of these occur. OTC generic Afrin (oxymetazolone) nasal spray, 2 sprays each nostril about 30 min before bedtime. Saline nasal spray, 2-3 sprays each nostril 3 times per day. Continue flonase once daily and take full adult doses of either ibuprofen or acetaminophen for pain/headache/ST. Call or return if not improving at 6-7 days of current illness OR if worsening prior to this (fever, SOB, wheezing).    An After Visit Summary was printed and given to the patient.  FOLLOW UP: prn

## 2012-05-07 NOTE — Patient Instructions (Addendum)
OTC generic Afrin (oxymetazolone) nasal spray, 2 sprays each nostril about 30 min before bedtime. Saline nasal spray, 2-3 sprays each nostril 3 times per day. Continue flonase once daily and take full adult doses of either ibuprofen or acetaminophen for pain/headache/ST.

## 2012-05-09 ENCOUNTER — Other Ambulatory Visit (INDEPENDENT_AMBULATORY_CARE_PROVIDER_SITE_OTHER): Payer: BC Managed Care – PPO

## 2012-05-09 DIAGNOSIS — E785 Hyperlipidemia, unspecified: Secondary | ICD-10-CM

## 2012-05-09 DIAGNOSIS — Z0000 Encounter for general adult medical examination without abnormal findings: Secondary | ICD-10-CM

## 2012-05-09 LAB — LIPID PANEL
HDL: 37 mg/dL — ABNORMAL LOW (ref >39.00)
Total CHOL/HDL Ratio: 5
Triglycerides: 195 mg/dL — ABNORMAL HIGH (ref 0.0–149.0)
VLDL: 39 mg/dL (ref 0.0–40.0)

## 2012-05-09 LAB — CBC
Hemoglobin: 13.1 g/dL (ref 12.0–15.0)
MCHC: 34 g/dL (ref 30.0–36.0)
RDW: 14.4 % (ref 11.5–14.6)

## 2012-05-09 LAB — RENAL FUNCTION PANEL
GFR: 83.76 mL/min (ref >60.00)
Glucose, Bld: 118 mg/dL — ABNORMAL HIGH (ref 70–99)
Phosphorus: 3.6 mg/dL (ref 2.3–4.6)
Potassium: 4.8 mEq/L (ref 3.5–5.1)
Sodium: 140 mEq/L (ref 135–145)

## 2012-05-09 LAB — HEPATIC FUNCTION PANEL
Bilirubin, Direct: 0.1 mg/dL (ref 0.0–0.3)
Total Bilirubin: 0.8 mg/dL (ref 0.3–1.2)

## 2012-05-09 LAB — TSH: TSH: 0.95 u[IU]/mL (ref 0.35–5.50)

## 2012-05-09 NOTE — Progress Notes (Signed)
Labs only

## 2012-05-11 ENCOUNTER — Other Ambulatory Visit: Payer: Self-pay | Admitting: Dermatology

## 2012-05-14 ENCOUNTER — Telehealth: Payer: Self-pay | Admitting: Family Medicine

## 2012-05-14 NOTE — Telephone Encounter (Signed)
Pt informed and appt scheduled.

## 2012-05-14 NOTE — Telephone Encounter (Signed)
Sure she can wait, labs were off but only slightly so we can discuss in March as long as she feels well

## 2012-05-14 NOTE — Telephone Encounter (Signed)
Patient had lab work last Monday at the OR office and is supposed to have an appointment tomorrow. She states that she needs to reschedule this appointment and wants to know if it would be okay to wait until mid-march? Since labs have already been done?

## 2012-05-14 NOTE — Telephone Encounter (Signed)
Please advise 

## 2012-05-15 ENCOUNTER — Ambulatory Visit: Payer: BC Managed Care – PPO | Admitting: Family Medicine

## 2012-05-16 ENCOUNTER — Ambulatory Visit: Payer: BC Managed Care – PPO | Admitting: Family Medicine

## 2012-05-22 ENCOUNTER — Telehealth: Payer: Self-pay | Admitting: Family Medicine

## 2012-05-22 DIAGNOSIS — F32A Depression, unspecified: Secondary | ICD-10-CM

## 2012-05-22 DIAGNOSIS — E785 Hyperlipidemia, unspecified: Secondary | ICD-10-CM

## 2012-05-22 DIAGNOSIS — F329 Major depressive disorder, single episode, unspecified: Secondary | ICD-10-CM

## 2012-05-23 MED ORDER — PRAVASTATIN SODIUM 80 MG PO TABS: 80.0000 mg | ORAL_TABLET | Freq: Every day | ORAL | Status: DC

## 2012-05-23 MED ORDER — DULOXETINE HCL 30 MG PO CPEP: 30.0000 mg | ORAL_CAPSULE | Freq: Every day | ORAL | Status: DC

## 2012-05-23 NOTE — Telephone Encounter (Signed)
RX sent to pharmacy  

## 2012-06-19 ENCOUNTER — Telehealth: Payer: Self-pay | Admitting: Family Medicine

## 2012-06-19 ENCOUNTER — Encounter: Payer: Self-pay | Admitting: Family Medicine

## 2012-06-19 ENCOUNTER — Ambulatory Visit (INDEPENDENT_AMBULATORY_CARE_PROVIDER_SITE_OTHER): Payer: BC Managed Care – PPO | Admitting: Family Medicine

## 2012-06-19 VITALS — BP 128/70 | HR 72 | Temp 98.0°F | Ht 62.25 in | Wt 190.1 lb

## 2012-06-19 DIAGNOSIS — R030 Elevated blood-pressure reading, without diagnosis of hypertension: Secondary | ICD-10-CM

## 2012-06-19 DIAGNOSIS — E663 Overweight: Secondary | ICD-10-CM

## 2012-06-19 DIAGNOSIS — R7309 Other abnormal glucose: Secondary | ICD-10-CM

## 2012-06-19 DIAGNOSIS — R739 Hyperglycemia, unspecified: Secondary | ICD-10-CM

## 2012-06-19 DIAGNOSIS — M549 Dorsalgia, unspecified: Secondary | ICD-10-CM

## 2012-06-19 DIAGNOSIS — E785 Hyperlipidemia, unspecified: Secondary | ICD-10-CM

## 2012-06-19 MED ORDER — KRILL OIL PO CAPS: ORAL_CAPSULE | ORAL | Status: DC

## 2012-06-19 MED ORDER — SIMVASTATIN 40 MG PO TABS: 40.0000 mg | ORAL_TABLET | Freq: Every day | ORAL | Status: DC

## 2012-06-19 NOTE — Assessment & Plan Note (Signed)
Mild weight loss since last visit, continue DASH diet

## 2012-06-19 NOTE — Progress Notes (Signed)
Patient ID: Cindy [name], female   DOB: 1946/01/22, 67 y.o.   MRN: [id number] Cindy [name] [id number] Mar 14, 1946 06/19/2012      Progress Note-Follow Up  Subjective  Chief Complaint  Chief Complaint  Patient presents with  . 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 She's doing well. No recent illness. No fevers, headaches, chest pain, palpitations, shortness of breath, GI or GU complaints. She continues to have back pain but feels it is improved with Cymbalta. She feels the Cymbalta is helping her manage the stress  Past Medical History  Diagnosis Date  . Other and unspecified hyperlipidemia   . Depressive disorder, not elsewhere classified   . Osteoarthritis   . Sleep apnea      On CPAP  . History of fractured kneecap     right   . Fibroid   . Chicken pox 67 yrs old  . Measles as a child  . Allergic state 02/01/2010    Qualifier: Diagnosis of  By: Fabian Sharp MD, Neta Mends   . Overweight 10/05/2011  . Allergy   . Thyroid disease     h/o hyperthyroid treated with radioactive iodine? from 16 to 20  . Hyperglycemia   . Back pain   . OA (osteoarthritis) of knee     left knee  . Varicose vein of leg   . Sleep apnea   . Knee pain, left 10/05/2011    Follows with Dr Marlene Lard    Past Surgical History  Procedure Laterality Date  . Back surgery      L5 discectomy, 2002. helpful  . Cpap    . Cesarean section  1987    Family History  Problem Relation Age of Onset  . Hyperlipidemia Mother   . Dementia Mother   . Hyperlipidemia Father   . Heart disease Father     bypass, CAD  . Hyperlipidemia Sister   . Diabetes Sister     type 2  . Dementia Maternal Grandmother     alzheimer  . Alcohol abuse Maternal Grandfather   . Diabetes Paternal Grandmother     type 2?    History   Social History  . Marital Status: Married    Spouse  Name: N/A    Number of Children: N/A  . Years of Education: N/A   Occupational History  . Not on file.   Social History Main Topics  . Smoking status: Never Smoker   . Smokeless tobacco: Never Used  . Alcohol Use: Yes     Comment: 2 glasses of wine weekly  . Drug Use: No  . Sexually Active: Not Currently   Other Topics Concern  . Not on file   Social History Narrative   Recently divorced separated fall 2012    Now living with friend in [place] area .   Never smoker   Regular exercise   Sleep 01/12/29 up at 6; job hours irreg              Current Outpatient Prescriptions on File Prior to Visit  Medication Sig Dispense Refill  . carboxymethylcellulose (REFRESH) 1 % ophthalmic solution Place 2 drops into both eyes 3 (three) times daily.      . DULoxetine (CYMBALTA) 30 MG capsule Take 1 capsule (30 mg total) by mouth daily. 1 daily  90 capsule  1  . loratadine (CLARITIN) 10 MG tablet Take 1 tablet (10 mg total) by mouth daily as needed for allergies.  30 tablet  5   No current facility-administered medications on file prior to visit.    Allergies  Allergen Reactions  . Amoxicillin   . Codeine   . Erythromycin   . Rosuvastatin     REACTION: myalgias  . Sulfonamide Derivatives     Review of Systems  Review of Systems  Constitutional: Negative for fever and malaise/fatigue.  HENT: Negative for congestion.   Eyes: Negative for discharge.  Respiratory: Negative for shortness of breath.   Cardiovascular: Negative for chest pain, palpitations and leg swelling.  Gastrointestinal: Negative for nausea, abdominal pain and diarrhea.  Genitourinary: Negative for dysuria.  Musculoskeletal: Positive for back pain. Negative for falls.  Skin: Negative for rash.  Neurological: Negative for loss of consciousness and headaches.  Endo/Heme/Allergies: Negative for polydipsia.  Psychiatric/Behavioral: Negative for depression and suicidal ideas. The patient is not nervous/anxious  and does not have insomnia.     Objective  BP 128/70  Pulse 72  Temp(Src) 98 F (36.7 C) (Oral)  Ht 5' 2.25" (1.581 m)  Wt 190 lb 1.3 oz (86.22 kg)  BMI 34.49 kg/m2  SpO2 92%  Physical Exam  Physical Exam  Constitutional: She is oriented to person, place, and time and well-developed, well-nourished, and in no distress. No distress.  HENT:  Head: Normocephalic and atraumatic.  Eyes: Conjunctivae are normal.  Neck: Neck supple. No thyromegaly present.  Cardiovascular: Normal rate, regular rhythm and normal heart sounds.   No murmur heard. Pulmonary/Chest: Effort normal and breath sounds normal. She has no wheezes.  Abdominal: She exhibits no distension and no mass.  Musculoskeletal: She exhibits no edema.  Lymphadenopathy:    She has no cervical adenopathy.  Neurological: She is alert and oriented to person, place, and time.  Skin: Skin is warm and dry. No rash noted. She is not diaphoretic.  Psychiatric: Memory, affect and judgment normal.    Lab Results  Component Value Date   TSH 0.95 05/09/2012   Lab Results  Component Value Date   WBC 6.9 05/09/2012   HGB 13.1 05/09/2012   HCT 38.6 05/09/2012   MCV 88.2 05/09/2012   PLT 306.0 05/09/2012   Lab Results  Component Value Date   CREATININE 0.7 05/09/2012   BUN 20 05/09/2012   NA 140 05/09/2012   K 4.8 05/09/2012   CL 106 05/09/2012   CO2 30 05/09/2012   Lab Results  Component Value Date   ALT 20 05/09/2012   AST 20 05/09/2012   ALKPHOS 86 05/09/2012   BILITOT 0.8 05/09/2012   Lab Results  Component Value Date   CHOL 201* 05/09/2012   Lab Results  Component Value Date   HDL 37.00* 05/09/2012   Lab Results  Component Value Date   LDLCALC 132* 11/10/2011   Lab Results  Component Value Date   TRIG 195.0* 05/09/2012   Lab Results  Component Value Date   CHOLHDL 5 05/09/2012     Assessment & Plan  Hyperglycemia hgba1c stable, encouraged increased exercise, minimize simple carbs  HYPERLIPIDEMIA Pravastatin has not fully  controlled er numbers, she has picked up a 90 day supply of Pravastatinonce this is done she will try a course of Simvastatin 40 mg, avoid trans fats and continue [employer] oil  Overweight Mild weight loss since last visit, continue DASH diet  INCREASED BLOOD PRESSURE Well controlled no changes,  minimize sodium  Back pain cymbalta has been helping, no new medications today

## 2012-06-19 NOTE — Assessment & Plan Note (Signed)
hgba1c stable, encouraged increased exercise, minimize simple carbs

## 2012-06-19 NOTE — Assessment & Plan Note (Signed)
cymbalta has been helping, no new medications today

## 2012-06-19 NOTE — Assessment & Plan Note (Signed)
Well controlled no changes, minimize sodium

## 2012-06-19 NOTE — Telephone Encounter (Signed)
Labs prior to visit lipid, renal, hepatic, tsh, cbc  Patient has upcoming appointment in August/2014. She will be going to [facility] lab

## 2012-06-19 NOTE — Assessment & Plan Note (Signed)
Pravastatin has not fully controlled er numbers, she has picked up a 90 day supply of Pravastatinonce this is done she will try a course of Simvastatin 40 mg, avoid trans fats and continue Krill oil

## 2012-06-19 NOTE — Patient Instructions (Addendum)
Labs prior to visit lipid, renal, hepatic, tsh, cbc

## 2012-06-20 NOTE — Telephone Encounter (Signed)
Future Lab Orders Placed/SLS

## 2012-07-27 ENCOUNTER — Telehealth: Payer: Self-pay | Admitting: Pulmonary Disease

## 2012-07-27 NOTE — Telephone Encounter (Signed)
Form is in Dr. Silverio Lay look at. Mindy will have him sign once hes in office today

## 2012-07-30 NOTE — Telephone Encounter (Signed)
Dr. Craige Cotta do you still have forms please advise thanks

## 2012-07-30 NOTE — Telephone Encounter (Signed)
LMTCB for [employer]

## 2012-07-30 NOTE — Telephone Encounter (Deleted)
I have already done this

## 2012-07-30 NOTE — Telephone Encounter (Signed)
Asher Muir calling about the same.  [phone]

## 2012-07-30 NOTE — Telephone Encounter (Signed)
Spoke with pt and notified of recs per VS I have scheduled ov with VS for 09/25/12 This is the soonest she could come in, has a new job and can not miss work

## 2012-07-30 NOTE — Telephone Encounter (Signed)
At her last visit with me 03/13/12 she reported using CPAP effectively and with benefit.  This is new information that she is not able to use CPAP.  She needs to have ROV to discuss status of her sleep apnea before I can sign form for her to get oral appliance for her sleep apnea.

## 2012-07-30 NOTE — Telephone Encounter (Signed)
Mindy please advise once the forms are signed thanks

## 2012-07-31 ENCOUNTER — Telehealth: Payer: Self-pay | Admitting: Pulmonary Disease

## 2012-07-31 NOTE — Telephone Encounter (Signed)
I spoke with [facility]. Advised them pt is scheduled to see VS for appt as she did not mention she wanted oral appliance. Nothing further was needed

## 2012-08-02 ENCOUNTER — Telehealth: Payer: Self-pay | Admitting: Pulmonary Disease

## 2012-08-02 NOTE — Telephone Encounter (Signed)
rec'd from [employer] forward 2 pages to Dr. Craige Cotta

## 2012-08-11 ENCOUNTER — Encounter (HOSPITAL_COMMUNITY): Payer: Self-pay | Admitting: *Deleted

## 2012-08-11 ENCOUNTER — Emergency Department (HOSPITAL_COMMUNITY)
Admission: EM | Admit: 2012-08-11 | Discharge: 2012-08-12 | Disposition: A | Discharge status: Home / Self Care | Payer: Medicare Other | Attending: Emergency Medicine | Admitting: Emergency Medicine

## 2012-08-11 DIAGNOSIS — Y939 Activity, unspecified: Secondary | ICD-10-CM | POA: Insufficient documentation

## 2012-08-11 DIAGNOSIS — H53149 Visual discomfort, unspecified: Secondary | ICD-10-CM | POA: Insufficient documentation

## 2012-08-11 DIAGNOSIS — F329 Major depressive disorder, single episode, unspecified: Secondary | ICD-10-CM | POA: Insufficient documentation

## 2012-08-11 DIAGNOSIS — X58XXXA Exposure to other specified factors, initial encounter: Secondary | ICD-10-CM | POA: Insufficient documentation

## 2012-08-11 DIAGNOSIS — S0501XA Injury of conjunctiva and corneal abrasion without foreign body, right eye, initial encounter: Secondary | ICD-10-CM

## 2012-08-11 DIAGNOSIS — Z8742 Personal history of other diseases of the female genital tract: Secondary | ICD-10-CM | POA: Insufficient documentation

## 2012-08-11 DIAGNOSIS — Z8781 Personal history of (healed) traumatic fracture: Secondary | ICD-10-CM | POA: Insufficient documentation

## 2012-08-11 DIAGNOSIS — Z862 Personal history of diseases of the blood and blood-forming organs and certain disorders involving the immune mechanism: Secondary | ICD-10-CM | POA: Insufficient documentation

## 2012-08-11 DIAGNOSIS — Z8619 Personal history of other infectious and parasitic diseases: Secondary | ICD-10-CM | POA: Insufficient documentation

## 2012-08-11 DIAGNOSIS — G4730 Sleep apnea, unspecified: Secondary | ICD-10-CM | POA: Insufficient documentation

## 2012-08-11 DIAGNOSIS — Z8739 Personal history of other diseases of the musculoskeletal system and connective tissue: Secondary | ICD-10-CM | POA: Insufficient documentation

## 2012-08-11 DIAGNOSIS — F3289 Other specified depressive episodes: Secondary | ICD-10-CM | POA: Insufficient documentation

## 2012-08-11 DIAGNOSIS — Z79899 Other long term (current) drug therapy: Secondary | ICD-10-CM | POA: Insufficient documentation

## 2012-08-11 DIAGNOSIS — Z8679 Personal history of other diseases of the circulatory system: Secondary | ICD-10-CM | POA: Insufficient documentation

## 2012-08-11 DIAGNOSIS — Z8639 Personal history of other endocrine, nutritional and metabolic disease: Secondary | ICD-10-CM | POA: Insufficient documentation

## 2012-08-11 DIAGNOSIS — S058X9A Other injuries of unspecified eye and orbit, initial encounter: Secondary | ICD-10-CM | POA: Insufficient documentation

## 2012-08-11 DIAGNOSIS — Y929 Unspecified place or not applicable: Secondary | ICD-10-CM | POA: Insufficient documentation

## 2012-08-11 MED ORDER — FLUORESCEIN SODIUM 1 MG OP STRP
ORAL_STRIP | OPHTHALMIC | Status: AC
  Administered 2012-08-11
  Filled 2012-08-11: qty 1

## 2012-08-11 MED ORDER — TETRACAINE HCL 0.5 % OP SOLN
2.0000 [drp] | Freq: Once | OPHTHALMIC | Status: AC
  Administered 2012-08-11: 2 [drp] via OPHTHALMIC
  Filled 2012-08-11: qty 2

## 2012-08-11 NOTE — ED Notes (Signed)
Rt eye  Painful she thinks she has corneal abrasion.  She does not wear  Contacts poss fb

## 2012-08-11 NOTE — ED Notes (Signed)
Pt states that she has had scratched cornea before and it feels like her right cornea has been scratched. Denies blurry vision. Right eye watery.

## 2012-08-12 MED ORDER — HYDROCODONE-ACETAMINOPHEN 5-325 MG PO TABS: 1.0000 | ORAL_TABLET | Freq: Four times a day (QID) | ORAL | Status: DC | PRN

## 2012-08-12 MED ORDER — TOBRAMYCIN 0.3 % OP SOLN
2.0000 [drp] | OPHTHALMIC | Status: DC
  Administered 2012-08-12: 2 [drp] via OPHTHALMIC
  Filled 2012-08-12: qty 5

## 2012-08-12 NOTE — ED Notes (Signed)
Visual acquity rt eye 20/200 lt eye 20/70.  Both eyes 20/70

## 2012-08-12 NOTE — ED Provider Notes (Signed)
History     CSN: [id number]  Arrival date & time 08/11/12  2317   First MD Initiated Contact with Patient 08/11/12 2333      Chief Complaint  Patient presents with  . Eye Problem   HPI  History provided by the patient. Patient is a 67 year old female who presents with complaints of right eye pain. Pain began earlier this evening have been persistent. Symptoms are worse with bright lights. There is associated tearing of the eyes. Symptoms do feel similar to previous corneal abrasions in the past. Patient does not recall any specific in her eye today or any increase itching or scratching. She does recall having a piece of material in and around her eye 2 days ago but did not seem to have any problem. She denies any other complaints. No vision loss. No nasal congestion, rhinorrhea, fever, chills or sweats. Other aggravating or alleviating factors. No other associated symptoms.      Past Medical History  Diagnosis Date  . Other and unspecified hyperlipidemia   . Depressive disorder, not elsewhere classified   . Osteoarthritis   . Sleep apnea      On CPAP  . History of fractured kneecap     right   . Fibroid   . Chicken pox 67 yrs old  . Measles as a child  . Allergic state 02/01/2010    Qualifier: Diagnosis of  By: Fabian Sharp MD, Neta Mends   . Overweight 10/05/2011  . Allergy   . Thyroid disease     h/o hyperthyroid treated with radioactive iodine? from 16 to 20  . Hyperglycemia   . Back pain   . OA (osteoarthritis) of knee     left knee  . Varicose vein of leg   . Sleep apnea   . Knee pain, left 10/05/2011    Follows with Dr Marlene Lard    Past Surgical History  Procedure Laterality Date  . Back surgery      L5 discectomy, 2002. helpful  . Cpap    . Cesarean section  1987    Family History  Problem Relation Age of Onset  . Hyperlipidemia Mother   . Dementia Mother   . Hyperlipidemia Father   . Heart disease Father     bypass, CAD  . Hyperlipidemia Sister   . Diabetes  Sister     type 2  . Dementia Maternal Grandmother     alzheimer  . Alcohol abuse Maternal Grandfather   . Diabetes Paternal Grandmother     type 2?    History  Substance Use Topics  . Smoking status: Never Smoker   . Smokeless tobacco: Never Used  . Alcohol Use: Yes     Comment: 2 glasses of wine weekly    OB History   Grav Para Term Preterm Abortions TAB SAB Ect Mult Living                  Review of Systems  Constitutional: Negative for fever and chills.  Eyes: Positive for photophobia and pain. Negative for visual disturbance.  All other systems reviewed and are negative.    Allergies  Codeine; Rosuvastatin; Amoxicillin; Erythromycin; and Sulfonamide derivatives  Home Medications   Current Outpatient Rx  Name  Route  Sig  Dispense  Refill  . Artificial Tear Ointment (REFRESH LACRI-LUBE) OINT   Both Eyes   Place 1 application into both eyes at bedtime.         . carboxymethylcellulose (REFRESH) 1 % ophthalmic solution  Both Eyes   Place 2 drops into both eyes 3 (three) times daily.         . DULoxetine (CYMBALTA) 30 MG capsule   Oral   Take 1 capsule (30 mg total) by mouth daily. 1 daily   90 capsule   1   . Krill Oil CAPS   Oral   Take 1 capsule by mouth daily. megared krill oil caps daily         . loratadine (CLARITIN) 10 MG tablet   Oral   Take 10 mg by mouth daily as needed for allergies.         . simvastatin (ZOCOR) 40 MG tablet   Oral   Take 1 tablet (40 mg total) by mouth at bedtime.   90 tablet   1     BP 135/56  Pulse 58  Temp(Src) 98 F (36.7 C) (Oral)  SpO2 96%  Physical Exam  Nursing note and vitals reviewed. Constitutional: She is oriented to person, place, and time. She appears well-developed and well-nourished. No distress.  HENT:  Head: Normocephalic.  Eyes: No foreign bodies found.  Slit lamp exam:      The right eye shows corneal abrasion and fluorescein uptake.    Right eye pressures 11, 9, 12    Cardiovascular: Normal rate and regular rhythm.   Pulmonary/Chest: Effort normal and breath sounds normal.  Neurological: She is alert and oriented to person, place, and time.  Skin: Skin is warm and dry. No rash noted.  Psychiatric: She has a normal mood and affect. Her behavior is normal.    ED Course  Procedures     1. Corneal abrasion, right, initial encounter       MDM  12:10AM patient seen and evaluated. She in no acute distress.        Angus Seller, PA-C 08/13/12 [phone]

## 2012-08-13 NOTE — ED Provider Notes (Signed)
Medical screening examination/treatment/procedure(s) were performed by non-physician practitioner and as supervising physician I was immediately available for consultation/collaboration.   Laray Anger, DO 08/13/12 1511

## 2012-08-14 ENCOUNTER — Encounter (HOSPITAL_COMMUNITY): Payer: Self-pay | Admitting: *Deleted

## 2012-08-14 ENCOUNTER — Emergency Department (INDEPENDENT_AMBULATORY_CARE_PROVIDER_SITE_OTHER)
Admission: EM | Admit: 2012-08-14 | Discharge: 2012-08-14 | Disposition: A | Discharge status: Home / Self Care | Payer: BC Managed Care – PPO | Source: Home / Self Care

## 2012-08-14 DIAGNOSIS — H00016 Hordeolum externum left eye, unspecified eyelid: Secondary | ICD-10-CM

## 2012-08-14 DIAGNOSIS — H00019 Hordeolum externum unspecified eye, unspecified eyelid: Secondary | ICD-10-CM

## 2012-08-14 NOTE — ED Notes (Addendum)
C/o  pain in L eyelidonset last night. Lower lid appears red and swollen. Was seen in ED 2 days ago for scratched cornea on R eye.  Dr. Artis Flock came to assess while I was doing pt.'s hx.

## 2012-08-14 NOTE — ED Provider Notes (Signed)
History     CSN: [id number]  Arrival date & time 08/14/12  1640   None     Chief Complaint  Patient presents with  . Eye Pain    (Consider location/radiation/quality/duration/timing/severity/associated sxs/prior treatment) Patient is a 67 y.o. female presenting with eye pain. The history is provided by the patient.  Eye Pain This is a new problem. The current episode started yesterday. The problem occurs constantly. The problem has been gradually worsening.    Past Medical History  Diagnosis Date  . Other and unspecified hyperlipidemia   . Depressive disorder, not elsewhere classified   . Osteoarthritis   . Sleep apnea      On CPAP  . History of fractured kneecap     right   . Fibroid   . Chicken pox 67 yrs old  . Measles as a child  . Allergic state 02/01/2010    Qualifier: Diagnosis of  By: Fabian Sharp MD, Neta Mends   . Overweight 10/05/2011  . Allergy   . Thyroid disease     h/o hyperthyroid treated with radioactive iodine? from 16 to 20  . Hyperglycemia   . Back pain   . OA (osteoarthritis) of knee     left knee  . Varicose vein of leg   . Sleep apnea   . Knee pain, left 10/05/2011    Follows with Dr Marlene Lard    Past Surgical History  Procedure Laterality Date  . Back surgery      L5 discectomy, 2002. helpful  . Cpap    . Cesarean section  1987    Family History  Problem Relation Age of Onset  . Hyperlipidemia Mother   . Dementia Mother   . Hyperlipidemia Father   . Heart disease Father     bypass, CAD  . Hyperlipidemia Sister   . Diabetes Sister     type 2  . Dementia Maternal Grandmother     alzheimer  . Alcohol abuse Maternal Grandfather   . Diabetes Paternal Grandmother     type 2?    History  Substance Use Topics  . Smoking status: Never Smoker   . Smokeless tobacco: Never Used  . Alcohol Use: Yes     Comment: 2 glasses of wine weekly    OB History   Grav Para Term Preterm Abortions TAB SAB Ect Mult Living                  Review of  Systems  Constitutional: Negative.   HENT: Negative.   Eyes: Positive for pain. Negative for photophobia, discharge and itching.    Allergies  Codeine; Rosuvastatin; Amoxicillin; Erythromycin; and Sulfonamide derivatives  Home Medications   Current Outpatient Rx  Name  Route  Sig  Dispense  Refill  . Artificial Tear Ointment (REFRESH LACRI-LUBE) OINT   Both Eyes   Place 1 application into both eyes at bedtime.         . carboxymethylcellulose (REFRESH) 1 % ophthalmic solution   Both Eyes   Place 2 drops into both eyes 3 (three) times daily.         . DULoxetine (CYMBALTA) 30 MG capsule   Oral   Take 1 capsule (30 mg total) by mouth daily. 1 daily   90 capsule   1   . Krill Oil CAPS   Oral   Take 1 capsule by mouth daily. megared krill oil caps daily         . pravastatin (PRAVACHOL) 80 MG tablet  Oral   Take 80 mg by mouth at bedtime.         Marland Kitchen tobramycin (TOBREX) 0.3 % ophthalmic solution      1 drop every 4 (four) hours.         Marland Kitchen HYDROcodone-acetaminophen (NORCO) 5-325 MG per tablet   Oral   Take 1 tablet by mouth every 6 (six) hours as needed for pain.   10 tablet   0   . loratadine (CLARITIN) 10 MG tablet   Oral   Take 10 mg by mouth daily as needed for allergies.         . simvastatin (ZOCOR) 40 MG tablet   Oral   Take 1 tablet (40 mg total) by mouth at bedtime.   90 tablet   1     BP 124/49  Pulse 57  Temp(Src) 98.3 F (36.8 C) (Oral)  Resp 16  SpO2 100%  Physical Exam  Nursing note and vitals reviewed. Constitutional: She appears well-developed and well-nourished.  HENT:  Head: Normocephalic.  Right Ear: External ear normal.  Mouth/Throat: Oropharynx is clear and moist.  Eyes: Conjunctivae and EOM are normal. Pupils are equal, round, and reactive to light. Right eye exhibits no discharge. Left eye exhibits no discharge.      ED Course  Procedures (including critical care time)  Labs Reviewed - No data to display No  results found.   1. Stye, left       MDM          Linna Hoff, MD 08/14/12 [phone]

## 2012-09-14 ENCOUNTER — Other Ambulatory Visit: Payer: Self-pay | Admitting: Family Medicine

## 2012-09-18 ENCOUNTER — Other Ambulatory Visit: Payer: Self-pay

## 2012-09-18 MED ORDER — PRAVASTATIN SODIUM 80 MG PO TABS: 80.0000 mg | ORAL_TABLET | Freq: Every day | ORAL | Status: DC

## 2012-09-25 ENCOUNTER — Ambulatory Visit (INDEPENDENT_AMBULATORY_CARE_PROVIDER_SITE_OTHER): Payer: PRIVATE HEALTH INSURANCE | Admitting: Pulmonary Disease

## 2012-09-25 ENCOUNTER — Encounter: Payer: Self-pay | Admitting: Certified Nurse Midwife

## 2012-09-25 ENCOUNTER — Encounter: Payer: Self-pay | Admitting: Pulmonary Disease

## 2012-09-25 VITALS — BP 118/70 | HR 68 | Temp 98.2°F | Ht 66.0 in | Wt 192.0 lb

## 2012-09-25 DIAGNOSIS — G4733 Obstructive sleep apnea (adult) (pediatric): Secondary | ICD-10-CM

## 2012-09-25 NOTE — Patient Instructions (Signed)
Call your home care company about getting new CPAP supplies Follow up in 1 year

## 2012-09-25 NOTE — Progress Notes (Signed)
Chief Complaint  Patient presents with  . Sleep Apnea    Currently using CPAP every night for 7 hours per night. Wants to discuss possibly getting an oral appliance.    History of Present Illness: Cindy [name] is a 67 y.o. female with OSA.  She is here to determine whether she is a candidate for an oral appliance.  She received a flyer in the mail detailing how she could get an oral appliance for her sleep apnea.  She has been doing well with CPAP.  Her concern is related to bringing her CPAP with her when she travels.  Her machine was broken once by [employer].  It is also a nuisance needing to get distilled water when she travels, since she can't bring liquids on a plane flight.  TESTS: PSG 10/21/05 >> AHI 58    She  has a past medical history of Other and unspecified hyperlipidemia; Depressive disorder, not elsewhere classified; Osteoarthritis; Sleep apnea; History of fractured kneecap; Fibroid; Chicken pox (67 yrs old); Measles (as a child); Allergic state (02/01/2010); Overweight(278.02) (10/05/2011); Allergy; Thyroid disease; Hyperglycemia; Back pain; OA (osteoarthritis) of knee; Varicose vein of leg; Sleep apnea; and Knee pain, left (10/05/2011).  She  has past surgical history that includes Back surgery; CPAP; Cesarean section (1987); and Knee surgery (Right, 2006).   Outpatient Encounter Prescriptions as of 09/25/2012  Medication Sig Dispense Refill  . Artificial Tear Ointment (REFRESH LACRI-LUBE) OINT Place 1 application into both eyes at bedtime.      . carboxymethylcellulose (REFRESH) 1 % ophthalmic solution Place 2 drops into both eyes 3 (three) times daily.      . DULoxetine (CYMBALTA) 30 MG capsule Take 1 capsule (30 mg total) by mouth daily. 1 daily  90 capsule  1  . Krill Oil CAPS Take 1 capsule by mouth daily. megared krill oil caps daily      . loratadine (CLARITIN) 10 MG tablet Take 10 mg by mouth daily as needed for allergies.      . pravastatin (PRAVACHOL) 80 MG tablet Take 1  tablet (80 mg total) by mouth daily.  90 tablet  1  . tobramycin (TOBREX) 0.3 % ophthalmic solution 1 drop every 4 (four) hours.      Marland Kitchen HYDROcodone-acetaminophen (NORCO) 5-325 MG per tablet Take 1 tablet by mouth every 6 (six) hours as needed for pain.  10 tablet  0  . [DISCONTINUED] simvastatin (ZOCOR) 40 MG tablet Take 1 tablet (40 mg total) by mouth at bedtime.  90 tablet  1   No facility-administered encounter medications on file as of 09/25/2012.    Allergies  Allergen Reactions  . Codeine Other (See Comments)    hallucinations  . Rosuvastatin     REACTION: myalgias  . Amoxicillin Rash  . Erythromycin Other (See Comments)    cramps  . Sulfonamide Derivatives Rash    Physical Exam:  General - No distress ENT - No sinus tenderness, no oral exudate, no LAN Cardiac - s1s2 regular, no murmur Chest - No wheeze/rales/dullness Back - No focal tenderness Abd - Soft, non-tender Ext - No edema Neuro - Normal strength Skin - No rashes Psych - normal mood, and behavior   Assessment/Plan:  Coralyn Helling, MD [facility] Pager:  [phone] 09/25/2012, 3:10 PM

## 2012-09-26 ENCOUNTER — Ambulatory Visit (INDEPENDENT_AMBULATORY_CARE_PROVIDER_SITE_OTHER): Payer: PRIVATE HEALTH INSURANCE | Admitting: Certified Nurse Midwife

## 2012-09-26 ENCOUNTER — Encounter: Payer: Self-pay | Admitting: Certified Nurse Midwife

## 2012-09-26 VITALS — BP 106/62 | HR 60 | Resp 16 | Ht 65.25 in | Wt 192.0 lb

## 2012-09-26 DIAGNOSIS — Z0000 Encounter for general adult medical examination without abnormal findings: Secondary | ICD-10-CM

## 2012-09-26 DIAGNOSIS — Z01419 Encounter for gynecological examination (general) (routine) without abnormal findings: Secondary | ICD-10-CM

## 2012-09-26 NOTE — Progress Notes (Signed)
67 y.o. G16P1011 Married Caucasian Fe here for annual exam.  Menopausal no HRT, denies vaginal bleeding. Sees PCP every 6 months for cholesterol management and labs. No health issues today.  Patient's last menstrual period was 04/04/2000.          Sexually active: no  The current method of family planning is abstinence.    Exercising: yes  curves & walking Smoker:  no  Health Maintenance: Pap:  05-23-11 neg History of abnormal age 73 with LEEP MMG:  10-28-11 Colonoscopy: 2007 BMD:   2009 TDaP:  2014 Labs: none Self breast exam: done occ   reports that she has never smoked. She has never used smokeless tobacco. She reports that she drinks about 0.6 ounces of alcohol per week. She reports that she does not use illicit drugs.  Past Medical History  Diagnosis Date  . Other and unspecified hyperlipidemia   . Depressive disorder, not elsewhere classified   . Osteoarthritis   . Sleep apnea      On CPAP  . History of fractured kneecap     right   . Fibroid   . Chicken pox 67 yrs old  . Measles as a child  . Allergic state 02/01/2010    Qualifier: Diagnosis of  By: Fabian Sharp MD, Neta Mends   . Overweight(278.02) 10/05/2011  . Allergy   . Thyroid disease     h/o hyperthyroid treated with radioactive iodine? from 16 to 20  . Hyperglycemia   . Back pain   . OA (osteoarthritis) of knee     left knee  . Varicose vein of leg   . Sleep apnea   . Knee pain, left 10/05/2011    Follows with Dr Marlene Lard  . Anxiety     Past Surgical History  Procedure Laterality Date  . Back surgery      L5 discectomy, 2002. helpful  . Cpap    . Cesarean section  1987  . Knee surgery Right 2006    R knee fx. patella  . Leep      Current Outpatient Prescriptions  Medication Sig Dispense Refill  . Artificial Tear Ointment (REFRESH LACRI-LUBE) OINT Place 1 application into both eyes at bedtime.      . carboxymethylcellulose (REFRESH) 1 % ophthalmic solution Place 2 drops into both eyes 3 (three) times daily.       . DULoxetine (CYMBALTA) 30 MG capsule Take 1 capsule (30 mg total) by mouth daily. 1 daily  90 capsule  1  . Krill Oil CAPS Take 1 capsule by mouth daily. megared krill oil caps daily      . pravastatin (PRAVACHOL) 80 MG tablet Take 1 tablet (80 mg total) by mouth daily.  90 tablet  1   No current facility-administered medications for this visit.    Family History  Problem Relation Age of Onset  . Hyperlipidemia Mother   . Dementia Mother   . Hyperlipidemia Father   . Heart disease Father     bypass, CAD  . Hyperlipidemia Sister   . Diabetes Sister     type 2  . Dementia Maternal Grandmother     alzheimer  . Alcohol abuse Maternal Grandfather   . Diabetes Paternal Grandmother     type 2?    ROS:  Pertinent items are noted in HPI.  Otherwise, a comprehensive ROS was negative.  Exam:   BP 106/62  Pulse 60  Resp 16  Ht 5' 5.25" (1.657 m)  Wt 192 lb (87.091 kg)  BMI 31.72 kg/m2  LMP 04/04/2000 Height: 5' 5.25" (165.7 cm)  Ht Readings from Last 3 Encounters:  09/26/12 5' 5.25" (1.657 m)  09/25/12 5\' 6"  (1.676 m)  06/19/12 5' 2.25" (1.581 m)    General appearance: alert, cooperative and appears stated age Head: Normocephalic, without obvious abnormality, atraumatic Neck: no adenopathy, supple, symmetrical, trachea midline and thyroid normal to inspection and palpation Lungs: clear to auscultation bilaterally Breasts: normal appearance, no masses or tenderness, No nipple retraction or dimpling, No nipple discharge or bleeding, No axillary or supraclavicular adenopathy Heart: regular rate and rhythm Abdomen: soft, non-tender; no masses,  no organomegaly Extremities: extremities normal, atraumatic, no cyanosis or edema Skin: Skin color, texture, turgor normal. No rashes or lesions Lymph nodes: Cervical, supraclavicular, and axillary nodes normal. No abnormal inguinal nodes palpated Neurologic: Grossly normal   Pelvic: External genitalia:  no lesions               Urethra:  normal appearing urethra with no masses, tenderness or lesions              Bartholin's and Skene's: normal                 Vagina: normal appearing vagina with normal color and discharge, no lesions              Cervix: normal, non tender              Pap taken: yes Bimanual Exam:  Uterus:  normal size, contour, position, consistency, mobility, non-tender and anteverted              Adnexa: normal adnexa and no mass, fullness, tenderness               Rectovaginal: Confirms               Anus:  normal sphincter tone, no lesions  A:  Well Woman with normal exam  Menopausal no HRT  Hypercholesterolemia with stable medication with PCP management  History of uterine fibroids no uterine size change  History of abnormal pap ? Etiology with LEEP  P:   Reviewed health and wellness pertinent to exam  Aware of need to advise if vaginal bleeding  Continue follow up as indicated  Stressed yearly pap smears   Pap smear as per guidelines   Mammogram yearly pap smear taken today  counseled on breast self exam, mammography screening, menopause, adequate intake of calcium and vitamin D, diet and exercise  return annually or prn  An After Visit Summary was printed and given to the patient.  Reviewed, TL

## 2012-09-26 NOTE — Assessment & Plan Note (Signed)
We had an extensive conservation about the benefits of using CPAP versus oral appliance.  Given the severity of her sleep apnea, I have recommended that she continue with CPAP therapy.  She is agreeable to this plan.

## 2012-09-26 NOTE — Patient Instructions (Addendum)

## 2012-10-29 ENCOUNTER — Other Ambulatory Visit: Payer: Self-pay | Admitting: Certified Nurse Midwife

## 2012-10-29 DIAGNOSIS — Z1231 Encounter for screening mammogram for malignant neoplasm of breast: Secondary | ICD-10-CM

## 2012-11-14 ENCOUNTER — Ambulatory Visit (HOSPITAL_COMMUNITY)
Admission: RE | Admit: 2012-11-14 | Discharge: 2012-11-14 | Disposition: A | Discharge status: Home / Self Care | Payer: PRIVATE HEALTH INSURANCE | Source: Ambulatory Visit | Attending: Certified Nurse Midwife | Admitting: Certified Nurse Midwife

## 2012-11-14 ENCOUNTER — Telehealth: Payer: Self-pay

## 2012-11-14 ENCOUNTER — Ambulatory Visit: Payer: BC Managed Care – PPO

## 2012-11-14 DIAGNOSIS — R739 Hyperglycemia, unspecified: Secondary | ICD-10-CM

## 2012-11-14 DIAGNOSIS — R5381 Other malaise: Secondary | ICD-10-CM

## 2012-11-14 DIAGNOSIS — E785 Hyperlipidemia, unspecified: Secondary | ICD-10-CM

## 2012-11-14 DIAGNOSIS — Z1231 Encounter for screening mammogram for malignant neoplasm of breast: Secondary | ICD-10-CM | POA: Insufficient documentation

## 2012-11-14 DIAGNOSIS — R5383 Other fatigue: Secondary | ICD-10-CM

## 2012-11-14 DIAGNOSIS — R7309 Other abnormal glucose: Secondary | ICD-10-CM

## 2012-11-14 LAB — RENAL FUNCTION PANEL
BUN: 23 mg/dL (ref 6–23)
Creatinine, Ser: 0.8 mg/dL (ref 0.4–1.2)
GFR: 74.28 mL/min (ref >60.00)
Glucose, Bld: 92 mg/dL (ref 70–99)
Sodium: 139 mEq/L (ref 135–145)

## 2012-11-14 LAB — LIPID PANEL
Cholesterol: 208 mg/dL — ABNORMAL HIGH (ref 0–200)
Total CHOL/HDL Ratio: 5
Triglycerides: 124 mg/dL (ref 0.0–149.0)

## 2012-11-14 LAB — CBC WITH DIFFERENTIAL/PLATELET
Basophils Absolute: 0 10*3/uL (ref 0.0–0.1)
Eosinophils Relative: 1.9 % (ref 0.0–5.0)
HCT: 39.9 % (ref 36.0–46.0)
Lymphs Abs: 2.7 10*3/uL (ref 0.7–4.0)
MCV: 89.6 fl (ref 78.0–100.0)
Monocytes Absolute: 0.6 10*3/uL (ref 0.1–1.0)
Monocytes Relative: 6.5 % (ref 3.0–12.0)
Neutrophils Relative %: 60.1 % (ref 43.0–77.0)
Platelets: 296 10*3/uL (ref 150.0–400.0)
RDW: 14.4 % (ref 11.5–14.6)
WBC: 8.6 10*3/uL (ref 4.5–10.5)

## 2012-11-14 LAB — HEPATIC FUNCTION PANEL
ALT: 15 U/L (ref 0–35)
AST: 17 U/L (ref 0–37)
Total Protein: 7.1 g/dL (ref 6.0–8.3)

## 2012-11-14 LAB — TSH: TSH: 0.78 u[IU]/mL (ref 0.35–5.50)

## 2012-11-14 NOTE — Telephone Encounter (Signed)
Cindy [name] with [facility] called stating pt is at there office to get labs drawn  New orders put in Epic

## 2012-11-16 ENCOUNTER — Ambulatory Visit (INDEPENDENT_AMBULATORY_CARE_PROVIDER_SITE_OTHER): Payer: PRIVATE HEALTH INSURANCE | Admitting: Family Medicine

## 2012-11-16 ENCOUNTER — Encounter: Payer: Self-pay | Admitting: Family Medicine

## 2012-11-16 VITALS — BP 118/72 | HR 67 | Temp 98.2°F | Ht 62.25 in | Wt 189.1 lb

## 2012-11-16 DIAGNOSIS — G4733 Obstructive sleep apnea (adult) (pediatric): Secondary | ICD-10-CM

## 2012-11-16 DIAGNOSIS — F329 Major depressive disorder, single episode, unspecified: Secondary | ICD-10-CM

## 2012-11-16 DIAGNOSIS — M8580 Other specified disorders of bone density and structure, unspecified site: Secondary | ICD-10-CM

## 2012-11-16 DIAGNOSIS — M899 Disorder of bone, unspecified: Secondary | ICD-10-CM

## 2012-11-16 DIAGNOSIS — R030 Elevated blood-pressure reading, without diagnosis of hypertension: Secondary | ICD-10-CM

## 2012-11-16 DIAGNOSIS — F32A Depression, unspecified: Secondary | ICD-10-CM

## 2012-11-16 DIAGNOSIS — M949 Disorder of cartilage, unspecified: Secondary | ICD-10-CM

## 2012-11-16 DIAGNOSIS — H00016 Hordeolum externum left eye, unspecified eyelid: Secondary | ICD-10-CM

## 2012-11-16 DIAGNOSIS — H109 Unspecified conjunctivitis: Secondary | ICD-10-CM

## 2012-11-16 DIAGNOSIS — H00019 Hordeolum externum unspecified eye, unspecified eyelid: Secondary | ICD-10-CM

## 2012-11-16 MED ORDER — DULOXETINE HCL 30 MG PO CPEP: 30.0000 mg | ORAL_CAPSULE | Freq: Every day | ORAL | Status: DC

## 2012-11-16 MED ORDER — POLYMYXIN B-TRIMETHOPRIM 10000-0.1 UNIT/ML-% OP SOLN: 1.0000 [drp] | Freq: Four times a day (QID) | OPHTHALMIC | Status: DC

## 2012-11-19 ENCOUNTER — Encounter: Payer: Self-pay | Admitting: Family Medicine

## 2012-11-19 DIAGNOSIS — H00019 Hordeolum externum unspecified eye, unspecified eyelid: Secondary | ICD-10-CM | POA: Insufficient documentation

## 2012-11-19 HISTORY — DX: Hordeolum externum unspecified eye, unspecified eyelid: H00.019

## 2012-11-19 NOTE — Assessment & Plan Note (Signed)
Using cpap routinely 

## 2012-11-19 NOTE — Assessment & Plan Note (Signed)
Check vitamin d level. Increase exercise as tolerated

## 2012-11-19 NOTE — Assessment & Plan Note (Signed)
No elevation today

## 2012-11-19 NOTE — Progress Notes (Signed)
Patient ID: Cindy [name], female   DOB: 1945/10/17, 67 y.o.   MRN: [id number] STEPHANINE REAS [id number] 06-Jun-1945 11/19/2012      Progress Note-Follow Up  Subjective  Chief Complaint  Chief Complaint  Patient presents with  . Follow-up  . Stye    left eye- started last night    HPI  Patient is a 67 year old Caucasian female in today for follow up. She has been struggling with redness and irritation in her left eye since last night. Photophobia or pain. There is some mild congestion. No headache, chest pain, palpitations, shortness of breath. No GI or GU complaints taking medications as prescribed. No hospitalizations.  Past Medical History  Diagnosis Date  . Other and unspecified hyperlipidemia   . Depressive disorder, not elsewhere classified   . Osteoarthritis   . Sleep apnea      On CPAP  . History of fractured kneecap     right   . Fibroid   . Chicken pox 67 yrs old  . Measles as a child  . Allergic state 02/01/2010    Qualifier: Diagnosis of  By: Fabian Sharp MD, Neta Mends   . Overweight(278.02) 10/05/2011  . Allergy   . Thyroid disease     h/o hyperthyroid treated with radioactive iodine? from 16 to 20  . Hyperglycemia   . Back pain   . OA (osteoarthritis) of knee     left knee  . Varicose vein of leg   . Sleep apnea   . Knee pain, left 10/05/2011    Follows with Dr Marlene Lard  . Anxiety   . Stye 11/19/2012    Past Surgical History  Procedure Laterality Date  . Back surgery      L5 discectomy, 2002. helpful  . Cpap    . Cesarean section  1987  . Knee surgery Right 2006    R knee fx. patella  . Leep      Family History  Problem Relation Age of Onset  . Hyperlipidemia Mother   . Dementia Mother   . Hyperlipidemia Father   . Heart disease Father     bypass, CAD  . Hyperlipidemia Sister   . Diabetes Sister     type 2  . Dementia Maternal Grandmother     alzheimer  . Alcohol abuse Maternal Grandfather   . Diabetes Paternal Grandmother     type 2?     History   Social History  . Marital Status: Married    Spouse Name: N/A    Number of Children: N/A  . Years of Education: N/A   Occupational History  . Not on file.   Social History Main Topics  . Smoking status: Never Smoker   . Smokeless tobacco: Never Used  . Alcohol Use: 0.6 oz/week    1 Glasses of wine per week  . Drug Use: No  . Sexual Activity: Not Currently    Partners: Male    Birth Control/ Protection: Abstinence   Other Topics Concern  . Not on file   Social History Narrative   Recently divorced separated fall 2012    Now living with friend in [place] area .   Never smoker   Regular exercise   Sleep 01/12/29 up at 6; job hours irreg              Current Outpatient Prescriptions on File Prior to Visit  Medication Sig Dispense Refill  . Artificial Tear Ointment (REFRESH LACRI-LUBE) OINT Place 1 application into both  eyes at bedtime.      . carboxymethylcellulose (REFRESH) 1 % ophthalmic solution Place 2 drops into both eyes 3 (three) times daily.      [place] Lanius CAPS Take 1 capsule by mouth daily. megared krill oil caps daily      . pravastatin (PRAVACHOL) 80 MG tablet Take 1 tablet (80 mg total) by mouth daily.  90 tablet  1   No current facility-administered medications on file prior to visit.    Allergies  Allergen Reactions  . Codeine Other (See Comments)    hallucinations  . Crestor [Rosuvastatin]     REACTION: myalgias  . Amoxicillin Rash  . Erythromycin Other (See Comments)    cramps  . Sulfonamide Derivatives Rash    Review of Systems  Review of Systems  Constitutional: Negative for fever and malaise/fatigue.  HENT: Negative for congestion.   Eyes: Positive for discharge and redness. Negative for double vision, photophobia and pain.  Respiratory: Negative for shortness of breath.   Cardiovascular: Negative for chest pain, palpitations and leg swelling.  Gastrointestinal: Negative for nausea, abdominal pain and diarrhea.   Genitourinary: Negative for dysuria.  Musculoskeletal: Negative for falls.  Skin: Negative for rash.  Neurological: Negative for loss of consciousness and headaches.  Endo/Heme/Allergies: Negative for polydipsia.  Psychiatric/Behavioral: Negative for depression and suicidal ideas. The patient is not nervous/anxious and does not have insomnia.     Objective  BP 118/72  Pulse 67  Temp(Src) 98.2 F (36.8 C) (Oral)  Ht 5' 2.25" (1.581 m)  Wt 189 lb 1.3 oz (85.766 kg)  BMI 34.31 kg/m2  SpO2 95%  LMP 04/04/2000  Physical Exam  Physical Exam  Constitutional: She is oriented to person, place, and time and well-developed, well-nourished, and in no distress. No distress.  HENT:  Head: Normocephalic and atraumatic.  Eyes: Conjunctivae are normal. Left eye exhibits discharge.  Grey discharge left eye, sclerae and conjunctivae are injected in left eye  Neck: Neck supple. No thyromegaly present.  Cardiovascular: Normal rate, regular rhythm and normal heart sounds.   No murmur heard. Pulmonary/Chest: Effort normal and breath sounds normal. She has no wheezes.  Abdominal: She exhibits no distension and no mass.  Musculoskeletal: She exhibits no edema.  Lymphadenopathy:    She has no cervical adenopathy.  Neurological: She is alert and oriented to person, place, and time.  Skin: Skin is warm and dry. No rash noted. She is not diaphoretic.  Psychiatric: Memory, affect and judgment normal.    Lab Results  Component Value Date   TSH 0.78 11/14/2012   Lab Results  Component Value Date   WBC 8.6 11/14/2012   HGB 13.4 11/14/2012   HCT 39.9 11/14/2012   MCV 89.6 11/14/2012   PLT 296.0 11/14/2012   Lab Results  Component Value Date   CREATININE 0.8 11/14/2012   BUN 23 11/14/2012   NA 139 11/14/2012   K 4.3 11/14/2012   CL 107 11/14/2012   CO2 26 11/14/2012   Lab Results  Component Value Date   ALT 15 11/14/2012   AST 17 11/14/2012   ALKPHOS 73 11/14/2012   BILITOT 0.8 11/14/2012   Lab  Results  Component Value Date   CHOL 208* 11/14/2012   Lab Results  Component Value Date   HDL 40.90 11/14/2012   Lab Results  Component Value Date   LDLCALC 132* 11/10/2011   Lab Results  Component Value Date   TRIG 124.0 11/14/2012   Lab Results  Component Value Date  CHOLHDL 5 11/14/2012     Assessment & Plan  OSTEOPENIA Check vitamin d level. Increase exercise as tolerated  Stye Warm compresses, probiotics, polytrim drops  INCREASED BLOOD PRESSURE No elevation today  OBSTRUCTIVE SLEEP APNEA Using cpap routinely

## 2012-11-19 NOTE — Assessment & Plan Note (Signed)
Warm compresses, probiotics, polytrim drops

## 2012-11-21 ENCOUNTER — Ambulatory Visit: Payer: BC Managed Care – PPO | Admitting: Family Medicine

## 2012-11-21 NOTE — Progress Notes (Signed)
Quick Note:  Patient Informed and voiced understanding ______ 

## 2013-04-08 ENCOUNTER — Telehealth: Payer: Self-pay | Admitting: Certified Nurse Midwife

## 2013-04-08 ENCOUNTER — Encounter: Payer: Self-pay | Admitting: Certified Nurse Midwife

## 2013-04-08 ENCOUNTER — Ambulatory Visit (INDEPENDENT_AMBULATORY_CARE_PROVIDER_SITE_OTHER): Payer: PRIVATE HEALTH INSURANCE | Admitting: Certified Nurse Midwife

## 2013-04-08 VITALS — BP 110/72 | HR 74 | Resp 16 | Ht 65.25 in | Wt 203.0 lb

## 2013-04-08 DIAGNOSIS — N63 Unspecified lump in unspecified breast: Secondary | ICD-10-CM

## 2013-04-08 NOTE — Telephone Encounter (Signed)
Patient found a breast lump this morning.

## 2013-04-08 NOTE — Progress Notes (Signed)
   Subjective:   68 y.o. Married Caucasian female presents for evaluation of right breast mass. Onset of the symptoms was 4 days ago during routine breast exam. Patient sought evaluation because of breast lump.  Contributing factors include Jewish descent. Patient denies history of trauma, bites, or injuries.Patient denies skin change or nipple discharge or tenderness. Last mammogram was 11/15/12 and was normal with density class b. Mammograms have been normal. No family history of breast cancer. Patient is menopausal with no HRT use. Denies excessive caffeine use.  Review of Systems Pertinent items are noted in HPI.@SUBJECTIVE    Objective:   General appearance: alert, cooperative and appears stated age   Breasts: normal appearance, no masses or tenderness, No nipple retraction or dimpling, No nipple discharge or bleeding, No axillary or supraclavicular adenopathy, on left. Right breast no skin change, nipple discharge or enlarged lymp nodes, BB size mass noted at 3 o'clock 2 fb from aerola edge, palpated with patient sitting and lying down, non tender   Assessment:   ASSESSMENT:Patient is diagnosed with  Right breast mass   Plan:  Plan: Discussed findings with patient and need for evaluation with diagnostic mammogram and [name]. Patient agreeable with plan. Patient will be scheduled at notified of appointment. Stressed importance of keeping appointment for evaluation.

## 2013-04-08 NOTE — Telephone Encounter (Signed)
Spoke with patient and she is unable to come early today. Requests afternoon appointment. Prefers to see Regina Eck CNM. Dr. Charlies Constable in office as well, in case needed. Appointment for breast check scheduled. Last mammogram done 11/2012 at [facility], negative.

## 2013-04-09 NOTE — Telephone Encounter (Signed)
Put in mammogram hold

## 2013-04-09 NOTE — Telephone Encounter (Signed)
Spoke with patient and message given regarding breast center appointment.  Will follow up.

## 2013-04-09 NOTE — Telephone Encounter (Signed)
Returning a call to Tracy °

## 2013-04-09 NOTE — Telephone Encounter (Signed)
Patient scheduled for Dx Mammogram and R breast U/S at [facility] for 04/15/13 at 3:45. Latest appointment available. Message left to return call to [place] at [phone].

## 2013-04-09 NOTE — Progress Notes (Signed)
Note reviewed, agree with plan.  Latrece Nitta, MD  

## 2013-04-15 ENCOUNTER — Ambulatory Visit
Admission: RE | Admit: 2013-04-15 | Discharge: 2013-04-15 | Disposition: A | Discharge status: Home / Self Care | Payer: PRIVATE HEALTH INSURANCE | Source: Ambulatory Visit | Attending: Certified Nurse Midwife | Admitting: Certified Nurse Midwife

## 2013-04-15 DIAGNOSIS — N63 Unspecified lump in unspecified breast: Secondary | ICD-10-CM

## 2013-04-15 NOTE — Telephone Encounter (Signed)
Patient in hold.

## 2013-05-20 ENCOUNTER — Ambulatory Visit: Payer: PRIVATE HEALTH INSURANCE | Admitting: Family Medicine

## 2013-05-20 DIAGNOSIS — Z0289 Encounter for other administrative examinations: Secondary | ICD-10-CM

## 2013-07-02 ENCOUNTER — Ambulatory Visit: Payer: Self-pay | Admitting: Certified Nurse Midwife

## 2013-07-03 ENCOUNTER — Ambulatory Visit (INDEPENDENT_AMBULATORY_CARE_PROVIDER_SITE_OTHER): Payer: PRIVATE HEALTH INSURANCE | Admitting: Certified Nurse Midwife

## 2013-07-03 ENCOUNTER — Encounter: Payer: Self-pay | Admitting: Certified Nurse Midwife

## 2013-07-03 ENCOUNTER — Ambulatory Visit: Payer: Self-pay | Admitting: Certified Nurse Midwife

## 2013-07-03 VITALS — BP 118/60 | HR 80 | Resp 16 | Ht 65.0 in | Wt 198.6 lb

## 2013-07-03 DIAGNOSIS — Z1239 Encounter for other screening for malignant neoplasm of breast: Secondary | ICD-10-CM

## 2013-07-03 NOTE — Progress Notes (Signed)
68 y.o. Married Caucasian female G2P1011 here for follow up of previous ? Right  breast mass noted on exam on 04/08/13  with negative diagnostic mammogram and [name]. Patient has continued SBE and has not noted this area of concern again. Denies masses, breast tenderness, nipple discharge, skin change or pain. "I wished it away".  O: Healthy WD,WN female Affect: normal, orientation x 3 Diagnostic mammogram and US showed only fatty tissue and fibroglandular tissue in area.  Breast exam: Left breast no masses, skin change,nipple discharge noted. Right breast , no masses, skin change, nipple discharge noted. Area of concern palpated and no mass noted.  Bilateral axillary lymph nodes not tender or enlarged  A: Normal Breast Exam  P: Discussed findings of with patient. Continue SBE and advise if any change. Keep annual mammogram screening.  RV prn, aex

## 2013-07-05 NOTE — Progress Notes (Signed)
Reviewed personally.  M. Suzanne Refugia Laneve, MD.  

## 2013-07-22 ENCOUNTER — Ambulatory Visit: Payer: PRIVATE HEALTH INSURANCE | Admitting: Certified Nurse Midwife

## 2013-09-02 LAB — HM DIABETES EYE EXAM

## 2013-09-17 ENCOUNTER — Telehealth: Payer: Self-pay | Admitting: Pulmonary Disease

## 2013-09-17 NOTE — Telephone Encounter (Signed)
Okay for her to switch.  She would need to be set up with one of the other sleep MD's in practice >> Dr. Gwenette Greet, Dr. Elsworth Soho, or Dr. Annamaria Boots.

## 2013-09-17 NOTE — Telephone Encounter (Signed)
Dr. Annamaria Boots, are you willing to take on this pt? Thanks.

## 2013-09-17 NOTE — Telephone Encounter (Signed)
I can take her when she can be scheduled as a routine visit, not a work in.

## 2013-09-17 NOTE — Telephone Encounter (Signed)
Please advise Dr. Gwenette Greet thanks

## 2013-09-17 NOTE — Telephone Encounter (Signed)
Sleep apnea pts only need followup yearly, so this is not a scheduling issue.  I would rather not take on this pt.

## 2013-09-17 NOTE — Telephone Encounter (Signed)
Called spoke with pt. She is wanting to switch docs d/t VS schedule. Pt aware of our switching protocol. She does not have a preference on who she changes to. Please advise Dr. Halford Chessman thanks

## 2013-09-18 NOTE — Telephone Encounter (Signed)
Called spoke wth pt. appt scheduled in July. Nothing further needed

## 2013-09-23 ENCOUNTER — Telehealth: Payer: Self-pay | Admitting: Family Medicine

## 2013-09-23 DIAGNOSIS — R739 Hyperglycemia, unspecified: Secondary | ICD-10-CM

## 2013-09-23 DIAGNOSIS — E785 Hyperlipidemia, unspecified: Secondary | ICD-10-CM

## 2013-09-23 DIAGNOSIS — E663 Overweight: Secondary | ICD-10-CM

## 2013-09-23 DIAGNOSIS — R5381 Other malaise: Secondary | ICD-10-CM

## 2013-09-23 DIAGNOSIS — R5383 Other fatigue: Secondary | ICD-10-CM

## 2013-09-23 NOTE — Telephone Encounter (Signed)
Patient will be coming in for labs at the end of July. Please send orders to [facility] lab

## 2013-09-27 NOTE — Telephone Encounter (Signed)
Please advise 

## 2013-09-27 NOTE — Telephone Encounter (Signed)
Check lipid, renal, cbc, tsh, hepatic, for hi chol, hyperglycemia, fatigue and overweight at [facility] lab

## 2013-09-30 ENCOUNTER — Encounter: Payer: Self-pay | Admitting: Certified Nurse Midwife

## 2013-09-30 ENCOUNTER — Ambulatory Visit (INDEPENDENT_AMBULATORY_CARE_PROVIDER_SITE_OTHER): Payer: PRIVATE HEALTH INSURANCE | Admitting: Certified Nurse Midwife

## 2013-09-30 VITALS — BP 102/62 | HR 68 | Resp 16 | Ht 65.25 in | Wt 203.0 lb

## 2013-09-30 DIAGNOSIS — Z01419 Encounter for gynecological examination (general) (routine) without abnormal findings: Secondary | ICD-10-CM

## 2013-09-30 DIAGNOSIS — Z124 Encounter for screening for malignant neoplasm of cervix: Secondary | ICD-10-CM

## 2013-09-30 NOTE — Patient Instructions (Signed)

## 2013-09-30 NOTE — Progress Notes (Signed)
68 y.o. G39P1011 Married Caucasian Fe here for annual exam. Menopausal no HRT. Denies any vaginal bleeding or vaginal dryness. Not sexually active, mutual choice. Sees PCP for medication management for cholesterol and depression, labs, aex. Getting ready for trip to [facility]! Aware she has gained weight, plans to resume exercise program soon. Has mammogram follow up for ? Mass which was identified as just fat filled area. Follow up scheduled for 8/15. Patient aware of need to follow up. Occasional stress incontinence, but working on [employer], no issues with. No health issues today.  Patient's last menstrual period was 04/04/2000.          Sexually active: No.  The current method of family planning is post menopausal status.    Exercising: Yes.    walking Smoker:  no  Health Maintenance: Pap: 09-26-12 neg HPV HR neg MMG: 11-15-12  Negative, density category b, bi-rads category 1 04-15-13 rt breast u/s neg Colonoscopy: 2007 with PCP BMD:   2009 TDaP:  2007 Labs: pcp Self breast exam: every other month   reports that she has never smoked. She has never used smokeless tobacco. She reports that she drinks about .6 - 1.2 ounces of alcohol per week. She reports that she does not use illicit drugs.  Past Medical History  Diagnosis Date  . Other and unspecified hyperlipidemia   . Depressive disorder, not elsewhere classified   . Osteoarthritis   . Sleep apnea      On CPAP  . History of fractured kneecap     right   . Fibroid   . Chicken pox 68 yrs old  . Measles as a child  . Allergic state 02/01/2010    Qualifier: Diagnosis of  By: Regis Bill MD, Standley Brooking   . Overweight(278.02) 10/05/2011  . Allergy   . Thyroid disease     h/o hyperthyroid treated with radioactive iodine? from 16 to 20  . Hyperglycemia   . Back pain   . OA (osteoarthritis) of knee     left knee  . Varicose vein of leg   . Sleep apnea   . Knee pain, left 10/05/2011    Follows with Dr Margarette Canada  . Anxiety   . Stye 11/19/2012     Past Surgical History  Procedure Laterality Date  . Back surgery      L5 discectomy, 2002. helpful  . Cpap    . Cesarean section  1987  . Knee surgery Right 2006    R knee fx. patella  . Leep      Current Outpatient Prescriptions  Medication Sig Dispense Refill  . cycloSPORINE (RESTASIS) 0.05 % ophthalmic emulsion 1 drop daily.       . DULoxetine (CYMBALTA) 30 MG capsule Take 1 capsule (30 mg total) by mouth daily. 1 daily  90 capsule  3  . Krill Oil CAPS Take 1 capsule by mouth daily. megared krill oil caps daily      . pravastatin (PRAVACHOL) 80 MG tablet Take 1 tablet (80 mg total) by mouth daily.  90 tablet  1   No current facility-administered medications for this visit.    Family History  Problem Relation Age of Onset  . Hyperlipidemia Mother   . Dementia Mother   . Hyperlipidemia Father   . Heart disease Father     bypass, CAD  . Hyperlipidemia Sister   . Diabetes Sister     type 2  . Dementia Maternal Grandmother     alzheimer  . Alcohol abuse Maternal  Grandfather   . Diabetes Paternal Grandmother     type 2?    ROS:  Pertinent items are noted in HPI.  Otherwise, a comprehensive ROS was negative.  Exam:   BP 102/62  Pulse 68  Resp 16  Ht 5' 5.25" (1.657 m)  Wt 203 lb (92.08 kg)  BMI 33.54 kg/m2  LMP 04/04/2000 Height: 5' 5.25" (165.7 cm)  Ht Readings from Last 3 Encounters:  09/30/13 5' 5.25" (1.657 m)  07/03/13 5\' 5"  (1.651 m)  04/08/13 5' 5.25" (1.657 m)    General appearance: alert, cooperative and appears stated age Head: Normocephalic, without obvious abnormality, atraumatic Neck: no adenopathy, supple, symmetrical, trachea midline and thyroid normal to inspection and palpation and non-palpable Lungs: clear to auscultation bilaterally Breasts: normal appearance, no masses or tenderness, No nipple retraction or dimpling, No nipple discharge or bleeding, No axillary or supraclavicular adenopathy Heart: regular rate and rhythm Abdomen: soft,  non-tender; no masses,  no organomegaly Extremities: extremities normal, atraumatic, no cyanosis or edema Skin: Skin color, texture, turgor normal. No rashes or lesions Lymph nodes: Cervical, supraclavicular, and axillary nodes normal. No abnormal inguinal nodes palpated Neurologic: Grossly normal   Pelvic: External genitalia:  no lesions              Urethra:  normal appearing urethra with no masses, tenderness or lesions              Bartholin's and Skene's: normal                 Vagina: normal appearing vagina with normal color and discharge, no lesions              Cervix: normal, non tender with Leep appearance              Pap taken: Yes.   Bimanual Exam:  Uterus:  normal size, contour, position, consistency, mobility, non-tender and mid position              Adnexa: normal adnexa and no mass, fullness, tenderness               Rectovaginal: Confirms               Anus:  normal sphincter tone, no lesions  A:  Well Woman with normal exam  Menopausal no HRT  History of LEEP age 14 for high grade pap per patient  Right breast  Mass not palpated, follow up 8/15, per report fatty in appearance, benign appearance  History of Uterine fibroids not symptomatic  Elevated cholesterol/ anxiety on stable medication with PCP management    P:   Reviewed health and wellness pertinent to exam  Aware of need to evaluate if vaginal bleeding  Pap smear taken today with HPV reflex  Stressed importance of follow up screening mammogram 8/15  Continue follow up as indicated with PCP   counseled on breast self exam, mammography screening, feminine hygiene, adequate intake of calcium and vitamin D, diet and exercise, Kegel's exercises  return annually or prn  An After Visit Summary was printed and given to the patient.

## 2013-09-30 NOTE — Progress Notes (Signed)
Reviewed personally.  M. Suzanne Miller, MD.  

## 2013-10-03 LAB — IPS PAP TEST WITH REFLEX TO HPV

## 2013-10-29 ENCOUNTER — Other Ambulatory Visit (INDEPENDENT_AMBULATORY_CARE_PROVIDER_SITE_OTHER): Payer: PRIVATE HEALTH INSURANCE

## 2013-10-29 DIAGNOSIS — R5381 Other malaise: Secondary | ICD-10-CM

## 2013-10-29 DIAGNOSIS — R7309 Other abnormal glucose: Secondary | ICD-10-CM

## 2013-10-29 DIAGNOSIS — R739 Hyperglycemia, unspecified: Secondary | ICD-10-CM

## 2013-10-29 DIAGNOSIS — E663 Overweight: Secondary | ICD-10-CM

## 2013-10-29 DIAGNOSIS — R5383 Other fatigue: Secondary | ICD-10-CM

## 2013-10-29 DIAGNOSIS — E785 Hyperlipidemia, unspecified: Secondary | ICD-10-CM

## 2013-10-29 LAB — HEPATIC FUNCTION PANEL
ALT: 20 U/L (ref 0–35)
AST: 22 U/L (ref 0–37)
Albumin: 3.5 g/dL (ref 3.5–5.2)
Alkaline Phosphatase: 79 U/L (ref 39–117)
BILIRUBIN DIRECT: 0.1 mg/dL (ref 0.0–0.3)
BILIRUBIN TOTAL: 0.7 mg/dL (ref 0.2–1.2)
Total Protein: 6.6 g/dL (ref 6.0–8.3)

## 2013-10-29 LAB — CBC
HCT: 38.4 % (ref 36.0–46.0)
Hemoglobin: 12.9 g/dL (ref 12.0–15.0)
MCHC: 33.6 g/dL (ref 30.0–36.0)
MCV: 88.8 fl (ref 78.0–100.0)
PLATELETS: 310 10*3/uL (ref 150.0–400.0)
RBC: 4.33 Mil/uL (ref 3.87–5.11)
RDW: 14.3 % (ref 11.5–15.5)
WBC: 8.3 10*3/uL (ref 4.0–10.5)

## 2013-10-29 LAB — RENAL FUNCTION PANEL
Albumin: 3.5 g/dL (ref 3.5–5.2)
BUN: 20 mg/dL (ref 6–23)
CHLORIDE: 105 meq/L (ref 96–112)
CO2: 29 mEq/L (ref 19–32)
Calcium: 9.1 mg/dL (ref 8.4–10.5)
Creatinine, Ser: 0.9 mg/dL (ref 0.4–1.2)
GFR: 71.06 mL/min (ref >60.00)
Glucose, Bld: 127 mg/dL — ABNORMAL HIGH (ref 70–99)
PHOSPHORUS: 3.9 mg/dL (ref 2.3–4.6)
POTASSIUM: 4.8 meq/L (ref 3.5–5.1)
Sodium: 138 mEq/L (ref 135–145)

## 2013-10-29 LAB — LIPID PANEL
CHOL/HDL RATIO: 5
Cholesterol: 197 mg/dL (ref 0–200)
HDL: 40.5 mg/dL (ref >39.00)
LDL Cholesterol: 128 mg/dL — ABNORMAL HIGH (ref 0–99)
NONHDL: 156.5
TRIGLYCERIDES: 143 mg/dL (ref 0.0–149.0)
VLDL: 28.6 mg/dL (ref 0.0–40.0)

## 2013-10-29 LAB — TSH: TSH: 1.45 u[IU]/mL (ref 0.35–4.50)

## 2013-10-31 ENCOUNTER — Encounter: Payer: Self-pay | Admitting: Internal Medicine

## 2013-10-31 ENCOUNTER — Ambulatory Visit (INDEPENDENT_AMBULATORY_CARE_PROVIDER_SITE_OTHER): Payer: PRIVATE HEALTH INSURANCE | Admitting: Internal Medicine

## 2013-10-31 ENCOUNTER — Other Ambulatory Visit: Payer: Self-pay | Admitting: Family Medicine

## 2013-10-31 ENCOUNTER — Encounter (INDEPENDENT_AMBULATORY_CARE_PROVIDER_SITE_OTHER): Payer: Self-pay

## 2013-10-31 VITALS — BP 114/76 | HR 86 | Ht 66.0 in | Wt 203.0 lb

## 2013-10-31 DIAGNOSIS — G4733 Obstructive sleep apnea (adult) (pediatric): Secondary | ICD-10-CM

## 2013-10-31 NOTE — Progress Notes (Signed)
Chief Complaint  Patient presents with  . Sleep Apnea    Currently using CPAP every night for 7 hours per night. Wants to discuss possibly getting an oral appliance.    History of Present Illness: Cindy [name] is a 68 y.o. female with OSA.  She is here to determine whether she is a candidate for an oral appliance.  She received a flyer in the mail detailing how she could get an oral appliance for her sleep apnea.  She has been doing well with CPAP.  Her concern is related to bringing her CPAP with her when she travels.  Her machine was broken once by [employer].  It is also a nuisance needing to get distilled water when she travels, since she can't bring liquids on a plane flight.  TESTS: PSG 10/21/05 >> AHI 58   09/25/12- Dr Halford Chessman She  has a past medical history of Other and unspecified hyperlipidemia; Depressive disorder, not elsewhere classified; Osteoarthritis; Sleep apnea; History of fractured kneecap; Fibroid; Chicken pox (68 yrs old); Measles (as a child); Allergic state (02/01/2010); Overweight(278.02) (10/05/2011); Allergy; Thyroid disease; Hyperglycemia; Back pain; OA (osteoarthritis) of knee; Varicose vein of leg; Sleep apnea; and Knee pain, left (10/05/2011). She  has past surgical history that includes Back surgery; CPAP; Cesarean section (1987); and Knee surgery (Right, 2006).  10/31/13- 68 yoF never smoker COMPLAINS OF: Former VS pt- Wearing CPAP 7-8 hours per night. Pt needs all new supplies-DME is [facility] She asks reassessment of sleep apnea. Using CPAP/[facility], nasal mask and humidifier. Sleeps comfortably. Bedtime between 10:30 and midnight. Sleep latency 5 minutes, wakes maybe once before up at 6:30 AM. May go back to sleep until 8 AM. Drowsy at 3 PM.  No ENT surgery or lung disease. Marital separation, living alone.  Outpatient Encounter Prescriptions as of 09/25/2012  Medication Sig Dispense Refill  . Artificial Tear Ointment (REFRESH LACRI-LUBE) OINT Place 1 application into both  eyes at bedtime.      . carboxymethylcellulose (REFRESH) 1 % ophthalmic solution Place 2 drops into both eyes 3 (three) times daily.      . DULoxetine (CYMBALTA) 30 MG capsule Take 1 capsule (30 mg total) by mouth daily. 1 daily  90 capsule  1  . Krill Oil CAPS Take 1 capsule by mouth daily. megared krill oil caps daily      . loratadine (CLARITIN) 10 MG tablet Take 10 mg by mouth daily as needed for allergies.      . pravastatin (PRAVACHOL) 80 MG tablet Take 1 tablet (80 mg total) by mouth daily.  90 tablet  1  . tobramycin (TOBREX) 0.3 % ophthalmic solution 1 drop every 4 (four) hours.      Marland Kitchen HYDROcodone-acetaminophen (NORCO) 5-325 MG per tablet Take 1 tablet by mouth every 6 (six) hours as needed for pain.  10 tablet  0  . [DISCONTINUED] simvastatin (ZOCOR) 40 MG tablet Take 1 tablet (40 mg total) by mouth at bedtime.  90 tablet  1   No facility-administered encounter medications on file as of 09/25/2012.    Allergies  Allergen Reactions  . Codeine Other (See Comments)    hallucinations  . Rosuvastatin     REACTION: myalgias  . Amoxicillin Rash  . Erythromycin Other (See Comments)    cramps  . Sulfonamide Derivatives Rash   Past Medical History  Diagnosis Date  . Other and unspecified hyperlipidemia   . Depressive disorder, not elsewhere classified   . Osteoarthritis   . Sleep apnea  On CPAP  . History of fractured kneecap     right   . Fibroid   . Chicken pox 68 yrs old  . Measles as a child  . Allergic state 02/01/2010    Qualifier: Diagnosis of  By: Regis Bill MD, Standley Brooking   . Overweight(278.02) 10/05/2011  . Allergy   . Thyroid disease     h/o hyperthyroid treated with radioactive iodine? from 16 to 20  . Hyperglycemia   . Back pain   . OA (osteoarthritis) of knee     left knee  . Varicose vein of leg   . Sleep apnea   . Knee pain, left 10/05/2011    Follows with Dr Margarette Canada  . Anxiety   . Stye 11/19/2012  . Obesity 10/05/2011  . Diabetes    Past Surgical History   Procedure Laterality Date  . Back surgery      L5 discectomy, 2002. helpful  . Cpap    . Cesarean section  1987  . Knee surgery Right 2006    R knee fx. patella  . Leep     Family History  Problem Relation Age of Onset  . Hyperlipidemia Mother   . Dementia Mother   . Hyperlipidemia Father   . Heart disease Father     bypass, CAD  . Hyperlipidemia Sister   . Diabetes Sister     type 2  . Dementia Maternal Grandmother     alzheimer  . Alcohol abuse Maternal Grandfather   . Diabetes Paternal Grandmother     type 2?   History   Social History  . Marital Status: Legally Separated    Spouse Name: N/A    Number of Children: N/A  . Years of Education: N/A   Occupational History  . Not on file.   Social History Main Topics  . Smoking status: Never Smoker   . Smokeless tobacco: Never Used  . Alcohol Use: 0.6 - 1.2 oz/week    1-2 Glasses of wine per week  . Drug Use: No  . Sexual Activity: No   Other Topics Concern  . Not on file   Social History Narrative   Recently divorced separated fall 2012    Now living with friend in [place] area .   Never smoker   Regular exercise   Sleep 01/12/29 up at 6; job hours irreg             ROS-see HPI Constitutional:   No-   weight loss, night sweats, fevers, chills, fatigue, lassitude. HEENT:   No-  headaches, difficulty swallowing, tooth/dental problems, sore throat,       No-  sneezing, itching, ear ache, nasal congestion, post nasal drip,  CV:  No-   chest pain, orthopnea, PND, swelling in lower extremities, anasarca,                                  dizziness, palpitations Resp: No-   shortness of breath with exertion or at rest.              No-   productive cough,  No non-productive cough,  No- coughing up of blood.              No-   change in color of mucus.  No- wheezing.   Skin: No-   rash or lesions. GI:  No-   heartburn, indigestion, abdominal pain, nausea, vomiting, diarrhea,  change in bowel  habits, loss of appetite GU: No-   dysuria, change in color of urine, no urgency or frequency.  No- flank pain. MS:  No-   joint pain or swelling.  No- decreased range of motion.  No- back pain. Neuro-     nothing unusual Psych:  No- change in mood or affect. No depression or anxiety.  No memory loss.  OBJ- Physical Exam General- Alert, Oriented, Affect-appropriate, Distress- none acute Skin- rash-none, lesions- none, excoriation- none Lymphadenopathy- none Head- atraumatic            Eyes- Gross vision intact, PERRLA, conjunctivae and secretions clear            Ears- Hearing, canals-normal            Nose- Clear, no-Septal dev, mucus, polyps, erosion, perforation             Throat- Mallampati III , mucosa clear , drainage- none, tonsils- atrophic Neck- flexible , trachea midline, no stridor , thyroid nl, carotid no bruit Chest - symmetrical excursion , unlabored           Heart/CV- RRR , no murmur , no gallop  , no rub, nl s1 s2                           - JVD- none , edema- none, stasis changes- none, varices- none           Lung- clear to P&A, wheeze- none, cough- none , dullness-none, rub- none           Chest wall-  Abd- tender-no, distended-no, bowel sounds-present, HSM- no Br/ Gen/ Rectal- Not done, not indicated Extrem- cyanosis- none, clubbing, none, atrophy- none, strength- nl Neuro- grossly intact to observation   ROS-see HPI Constitutional:   No-   weight loss, night sweats, fevers, chills, fatigue, lassitude. HEENT:   No-  headaches, difficulty swallowing, tooth/dental problems, sore throat,       No-  sneezing, itching, ear ache, nasal congestion, post nasal drip,  CV:  No-   chest pain, orthopnea, PND, swelling in lower extremities, anasarca,                                  dizziness, palpitations Resp: No-   shortness of breath with exertion or at rest.              No-   productive cough,  No non-productive cough,  No- coughing up of blood.              No-    change in color of mucus.  No- wheezing.   Skin: No-   rash or lesions. GI:  No-   heartburn, indigestion, abdominal pain, nausea, vomiting, diarrhea,                 change in bowel habits, loss of appetite GU: No-   dysuria, change in color of urine, no urgency or frequency.  No- flank pain. MS:  No-   joint pain or swelling.  No- decreased range of motion.  No- back pain. Neuro-     nothing unusual Psych:  No- change in mood or affect. No depression or anxiety.  No memory loss.   Physical Exam: OBJ- Physical Exam General- Alert, Oriented, Affect-appropriate, Distress- none acute Skin- rash-none, lesions- none, excoriation- none Lymphadenopathy- none Head- atraumatic  Eyes- Gross vision intact, PERRLA, conjunctivae and secretions clear            Ears- Hearing, canals-normal            Nose- Clear, no-Septal dev, mucus, polyps, erosion, perforation             Throat- Mallampati II , mucosa clear , drainage- none, tonsils- atrophic Neck- flexible , trachea midline, no stridor , thyroid nl, carotid no bruit Chest - symmetrical excursion , unlabored           Heart/CV- RRR , no murmur , no gallop  , no rub, nl s1 s2                           - JVD- none , edema- none, stasis changes- none, varices- none           Lung- clear to P&A, wheeze- none, cough- none , dullness-none, rub- none           Chest wall-  Abd- tender-no, distended-no, bowel sounds-present, HSM- no Br/ Gen/ Rectal- Not done, not indicated Extrem- cyanosis- none, clubbing, none, atrophy- none, strength- nl Neuro- grossly intact to observation    Assessment/Plan:

## 2013-10-31 NOTE — Patient Instructions (Signed)
Order- DME [facility] CPAP download for pressure compliance, replace as needed- mask of choice, humidifier, supplies  Please call as needed

## 2013-11-01 ENCOUNTER — Encounter: Payer: Self-pay | Admitting: Family Medicine

## 2013-11-01 ENCOUNTER — Ambulatory Visit (INDEPENDENT_AMBULATORY_CARE_PROVIDER_SITE_OTHER): Payer: PRIVATE HEALTH INSURANCE | Admitting: Family Medicine

## 2013-11-01 VITALS — BP 122/78 | HR 82 | Temp 98.4°F | Ht 66.0 in | Wt 204.0 lb

## 2013-11-01 DIAGNOSIS — R739 Hyperglycemia, unspecified: Secondary | ICD-10-CM

## 2013-11-01 DIAGNOSIS — E785 Hyperlipidemia, unspecified: Secondary | ICD-10-CM

## 2013-11-01 DIAGNOSIS — E669 Obesity, unspecified: Secondary | ICD-10-CM

## 2013-11-01 DIAGNOSIS — I8390 Asymptomatic varicose veins of unspecified lower extremity: Secondary | ICD-10-CM

## 2013-11-01 DIAGNOSIS — R030 Elevated blood-pressure reading, without diagnosis of hypertension: Secondary | ICD-10-CM

## 2013-11-01 DIAGNOSIS — R7309 Other abnormal glucose: Secondary | ICD-10-CM

## 2013-11-01 LAB — HEMOGLOBIN A1C
HEMOGLOBIN A1C: 6.5 % — AB (ref <5.7)
Mean Plasma Glucose: 140 mg/dL — ABNORMAL HIGH (ref <117)

## 2013-11-01 MED ORDER — PRAVASTATIN SODIUM 80 MG PO TABS: 80.0000 mg | ORAL_TABLET | Freq: Every day | ORAL | Status: DC

## 2013-11-01 NOTE — Patient Instructions (Signed)
DASH Eating Plan °DASH stands for "Dietary Approaches to Stop Hypertension." The DASH eating plan is a healthy eating plan that has been shown to reduce high blood pressure (hypertension). Additional health benefits may include reducing the risk of type 2 diabetes mellitus, heart disease, and stroke. The DASH eating plan may also help with weight loss. °WHAT DO I NEED TO KNOW ABOUT THE DASH EATING PLAN? °For the DASH eating plan, you will follow these general guidelines: °· Choose foods with a percent daily value for sodium of less than 5% (as listed on the food label). °· Use salt-free seasonings or herbs instead of table salt or sea salt. °· Check with your health care provider or pharmacist before using salt substitutes. °· Eat lower-sodium products, often labeled as "lower sodium" or "no salt added." °· Eat fresh foods. °· Eat more vegetables, fruits, and low-fat dairy products. °· Choose whole grains. Look for the word "whole" as the first word in the ingredient list. °· Choose fish and skinless chicken or turkey more often than red meat. Limit fish, poultry, and meat to 6 oz (170 g) each day. °· Limit sweets, desserts, sugars, and sugary drinks. °· Choose heart-healthy fats. °· Limit cheese to 1 oz (28 g) per day. °· Eat more home-cooked food and less restaurant, buffet, and fast food. °· Limit fried foods. °· Cook foods using methods other than frying. °· Limit canned vegetables. If you do use them, rinse them well to decrease the sodium. °· When eating at a restaurant, ask that your food be prepared with less salt, or no salt if possible. °WHAT FOODS CAN I EAT? °Seek help from a dietitian for individual calorie needs. °Grains °Whole grain or whole wheat bread. Brown rice. Whole grain or whole wheat pasta. Quinoa, bulgur, and whole grain cereals. Low-sodium cereals. Corn or whole wheat flour tortillas. Whole grain cornbread. Whole grain crackers. Low-sodium crackers. °Vegetables °Fresh or frozen vegetables  (raw, steamed, roasted, or grilled). Low-sodium or reduced-sodium tomato and vegetable juices. Low-sodium or reduced-sodium tomato sauce and paste. Low-sodium or reduced-sodium canned vegetables.  °Fruits °All fresh, canned (in natural juice), or frozen fruits. °Meat and Other Protein Products °Ground beef (85% or leaner), grass-fed beef, or beef trimmed of fat. Skinless chicken or turkey. Ground chicken or turkey. Pork trimmed of fat. All fish and seafood. Eggs. Dried beans, peas, or lentils. Unsalted nuts and seeds. Unsalted canned beans. °Dairy °Low-fat dairy products, such as skim or 1% milk, 2% or reduced-fat cheeses, low-fat ricotta or cottage cheese, or plain low-fat yogurt. Low-sodium or reduced-sodium cheeses. °Fats and Oils °Tub margarines without trans fats. Light or reduced-fat mayonnaise and salad dressings (reduced sodium). Avocado. Safflower, olive, or canola oils. Natural peanut or almond butter. °Other °Unsalted popcorn and pretzels. °The items listed above may not be a complete list of recommended foods or beverages. Contact your dietitian for more options. °WHAT FOODS ARE NOT RECOMMENDED? °Grains °White bread. White pasta. White rice. Refined cornbread. Bagels and croissants. Crackers that contain trans fat. °Vegetables °Creamed or fried vegetables. Vegetables in a cheese sauce. Regular canned vegetables. Regular canned tomato sauce and paste. Regular tomato and vegetable juices. °Fruits °Dried fruits. Canned fruit in light or heavy syrup. Fruit juice. °Meat and Other Protein Products °Fatty cuts of meat. Ribs, chicken wings, bacon, sausage, bologna, salami, chitterlings, fatback, hot dogs, bratwurst, and packaged luncheon meats. Salted nuts and seeds. Canned beans with salt. °Dairy °Whole or 2% milk, cream, half-and-half, and cream cheese. Whole-fat or sweetened yogurt. Full-fat   cheeses or blue cheese. Nondairy creamers and whipped toppings. Processed cheese, cheese spreads, or cheese  curds. °Condiments °Onion and garlic salt, seasoned salt, table salt, and sea salt. Canned and packaged gravies. Worcestershire sauce. Tartar sauce. Barbecue sauce. Teriyaki sauce. Soy sauce, including reduced sodium. Steak sauce. Fish sauce. Oyster sauce. Cocktail sauce. Horseradish. Ketchup and mustard. Meat flavorings and tenderizers. Bouillon cubes. Hot sauce. Tabasco sauce. Marinades. Taco seasonings. Relishes. °Fats and Oils °Butter, stick margarine, lard, shortening, ghee, and bacon fat. Coconut, palm kernel, or palm oils. Regular salad dressings. °Other °Pickles and olives. Salted popcorn and pretzels. °The items listed above may not be a complete list of foods and beverages to avoid. Contact your dietitian for more information. °WHERE CAN I FIND MORE INFORMATION? °National Heart, Lung, and [employer] Institute: www.nhlbi.nih.gov/health/health-topics/topics/dash/ °Document Released: 03/10/2011 Document Revised: 08/05/2013 Document Reviewed: 01/23/2013 °ExitCare® Patient Information ©2015 [employer]. This information is not intended to replace advice given to you by your health care provider. Make sure you discuss any questions you have with your health care provider. ° °

## 2013-11-02 ENCOUNTER — Encounter: Payer: Self-pay | Admitting: Family Medicine

## 2013-11-02 DIAGNOSIS — I8390 Asymptomatic varicose veins of unspecified lower extremity: Secondary | ICD-10-CM | POA: Insufficient documentation

## 2013-11-02 LAB — HM MAMMOGRAPHY

## 2013-11-02 LAB — HM PAP SMEAR: HM Pap smear: NORMAL

## 2013-11-02 NOTE — Assessment & Plan Note (Signed)
Encouraged to use compression hose daily, minimize sodium, elevate feet, and referred to vascular surgery

## 2013-11-02 NOTE — Assessment & Plan Note (Signed)
Looks good today. Well controlled. Encouraged heart healthy diet such as the DASH diet and exercise as tolerated.

## 2013-11-02 NOTE — Progress Notes (Signed)
Patient ID: Cindy [name], female   DOB: 1945/09/19, 68 y.o.   MRN: [id number] Cindy [name] [id number] [birth year] 11/02/2013      Progress Note-Follow Up  Subjective  Chief Complaint  No chief complaint on file.   HPI  Patient is a 68 year old female in today for routine medical care. Is doing well. Seen recently by OB/GYN and they expressed concerns about her varicose veins. They are numerous but not symptomatic. No recent illness. Denies CP/palp/SOB/HA/congestion/fevers/GI or GU c/o. Taking meds as prescribed  Past Medical History  Diagnosis Date  . Other and unspecified hyperlipidemia   . Depressive disorder, not elsewhere classified   . Osteoarthritis   . Sleep apnea      On CPAP  . History of fractured kneecap     right   . Fibroid   . Chicken pox 68 yrs old  . Measles as a child  . Allergic state 02/01/2010    Qualifier: Diagnosis of  By: Regis Bill MD, Standley Brooking   . Overweight(278.02) 10/05/2011  . Allergy   . Thyroid disease     h/o hyperthyroid treated with radioactive iodine? from 16 to 20  . Hyperglycemia   . Back pain   . OA (osteoarthritis) of knee     left knee  . Varicose vein of leg   . Sleep apnea   . Knee pain, left 10/05/2011    Follows with Dr Margarette Canada  . Anxiety   . Stye 11/19/2012  . Obesity 10/05/2011  . Diabetes     Past Surgical History  Procedure Laterality Date  . Back surgery      L5 discectomy, 2002. helpful  . Cpap    . Cesarean section  1987  . Knee surgery Right 2006    R knee fx. patella  . Leep      Family History  Problem Relation Age of Onset  . Hyperlipidemia Mother   . Dementia Mother   . Hyperlipidemia Father   . Heart disease Father     bypass, CAD  . Hyperlipidemia Sister   . Diabetes Sister     type 2  . Dementia Maternal Grandmother     alzheimer  . Alcohol abuse Maternal Grandfather   . Diabetes Paternal Grandmother     type 2?    History   Social History  . Marital Status: Legally Separated    Spouse Name:  N/A    Number of Children: N/A  . Years of Education: N/A   Occupational History  . Not on file.   Social History Main Topics  . Smoking status: Never Smoker   . Smokeless tobacco: Never Used  . Alcohol Use: 0.6 - 1.2 oz/week    1-2 Glasses of wine per week  . Drug Use: No  . Sexual Activity: No   Other Topics Concern  . Not on file   Social History Narrative   Recently divorced separated fall 2012    Now living with friend in [place] area .   Never smoker   Regular exercise   Sleep 01/12/29 up at 6; job hours irreg              Current Outpatient Prescriptions on File Prior to Visit  Medication Sig Dispense Refill  . Ascorbic Acid (VITAMIN C PO) Take 1 tablet by mouth daily. 500 mg      . Cholecalciferol (VITAMIN D PO) Take 1 tablet by mouth daily. 1000 units      .  cycloSPORINE (RESTASIS) 0.05 % ophthalmic emulsion 1 drop daily.       . DULoxetine (CYMBALTA) 30 MG capsule Take 1 capsule (30 mg total) by mouth daily. 1 daily  90 capsule  3  . Krill Oil CAPS Take 1 capsule by mouth daily. megared krill oil caps daily       No current facility-administered medications on file prior to visit.    Allergies  Allergen Reactions  . Codeine Other (See Comments)    hallucinations  . Crestor [Rosuvastatin]     REACTION: myalgias  . Amoxicillin Rash  . Erythromycin Other (See Comments)    cramps  . Sulfonamide Derivatives Rash    Review of Systems  Review of Systems  Constitutional: Negative for fever and malaise/fatigue.  HENT: Negative for congestion.   Eyes: Negative for discharge.  Respiratory: Negative for shortness of breath.   Cardiovascular: Negative for chest pain, palpitations and leg swelling.  Gastrointestinal: Negative for nausea, abdominal pain and diarrhea.  Genitourinary: Negative for dysuria.  Musculoskeletal: Negative for falls.  Skin: Negative for rash.  Neurological: Negative for loss of consciousness and headaches.  Endo/Heme/Allergies:  Negative for polydipsia.  Psychiatric/Behavioral: Negative for depression and suicidal ideas. The patient is not nervous/anxious and does not have insomnia.     Objective  BP 122/78  Pulse 82  Temp(Src) 98.4 F (36.9 C) (Oral)  Ht 5\' 6"  (1.676 m)  Wt 204 lb (92.534 kg)  BMI 32.94 kg/m2  SpO2 94%  LMP 04/04/2000  Physical Exam  Physical Exam  Constitutional: She is oriented to person, place, and time and well-developed, well-nourished, and in no distress. No distress.  HENT:  Head: Normocephalic and atraumatic.  Eyes: Conjunctivae are normal.  Neck: Neck supple. No thyromegaly present.  Cardiovascular: Normal rate, regular rhythm and normal heart sounds.   No murmur heard. Pulmonary/Chest: Effort normal and breath sounds normal. She has no wheezes.  Abdominal: She exhibits no distension and no mass.  Musculoskeletal: She exhibits no edema.  Lymphadenopathy:    She has no cervical adenopathy.  Neurological: She is alert and oriented to person, place, and time.  Skin: Skin is warm and dry. No rash noted. She is not diaphoretic.  Psychiatric: Memory, affect and judgment normal.    Lab Results  Component Value Date   TSH 1.45 10/29/2013   Lab Results  Component Value Date   WBC 8.3 10/29/2013   HGB 12.9 10/29/2013   HCT 38.4 10/29/2013   MCV 88.8 10/29/2013   PLT 310.0 10/29/2013   Lab Results  Component Value Date   CREATININE 0.9 10/29/2013   BUN 20 10/29/2013   NA 138 10/29/2013   K 4.8 10/29/2013   CL 105 10/29/2013   CO2 29 10/29/2013   Lab Results  Component Value Date   ALT 20 10/29/2013   AST 22 10/29/2013   ALKPHOS 79 10/29/2013   BILITOT 0.7 10/29/2013   Lab Results  Component Value Date   CHOL 197 10/29/2013   Lab Results  Component Value Date   HDL 40.50 10/29/2013   Lab Results  Component Value Date   LDLCALC 128* 10/29/2013   Lab Results  Component Value Date   TRIG 143.0 10/29/2013   Lab Results  Component Value Date   CHOLHDL 5 10/29/2013      Assessment & Plan  Obesity Encouraged DASH diet, decrease po intake and increase exercise as tolerated. Needs 7-8 hours of sleep nightly. Avoid trans fats, eat small, frequent meals every 4-5 hours  with lean proteins, complex carbs and healthy fats. Minimize simple carbs, GMO foods.  INCREASED BLOOD PRESSURE Looks good today. Well controlled. Encouraged heart healthy diet such as the DASH diet and exercise as tolerated.   Diabetes hgba1c acceptable, minimize simple carbs. Increase exercise as tolerated. Diet controlled.  Varicose veins Encouraged to use compression hose daily, minimize sodium, elevate feet, and referred to vascular surgery  HYPERLIPIDEMIA Tolerating statin, encouraged heart healthy diet, avoid trans fats, minimize simple carbs and saturated fats. Increase exercise as tolerated

## 2013-11-02 NOTE — Assessment & Plan Note (Signed)
hgba1c acceptable, minimize simple carbs. Increase exercise as tolerated. Diet controlled °

## 2013-11-02 NOTE — Assessment & Plan Note (Signed)
Tolerating statin, encouraged heart healthy diet, avoid trans fats, minimize simple carbs and saturated fats. Increase exercise as tolerated 

## 2013-11-02 NOTE — Assessment & Plan Note (Signed)
Encouraged DASH diet, decrease po intake and increase exercise as tolerated. Needs 7-8 hours of sleep nightly. Avoid trans fats, eat small, frequent meals every 4-5 hours with lean proteins, complex carbs and healthy fats. Minimize simple carbs, GMO foods. 

## 2013-11-03 ENCOUNTER — Encounter: Payer: Self-pay | Admitting: Internal Medicine

## 2013-11-03 NOTE — Assessment & Plan Note (Signed)
There is no reason to expect sleep apnea to have resolved. We discussed replacement of an old machine which doesn't sound right. Plan-appropriate replacement of CPAP machine. Anticipate auto titration for pressure recommendation.

## 2013-11-05 ENCOUNTER — Telehealth: Payer: Self-pay

## 2013-11-05 NOTE — Telephone Encounter (Signed)
Pt left a message stating she would like a copy of her immunization report and would like to know what her A1C results were?  I called and informed pt that her A1C was 6.5 and a letter with that information was mailed to her. I also informed patient that we could mail immunization report to her or put at the front desk.  Pt asked that I put this at the front desk.  Immunization report printed and put at front desk

## 2013-11-19 ENCOUNTER — Ambulatory Visit: Payer: PRIVATE HEALTH INSURANCE | Admitting: Dietician

## 2013-11-22 ENCOUNTER — Other Ambulatory Visit: Payer: Self-pay | Admitting: *Deleted

## 2013-11-22 DIAGNOSIS — I83893 Varicose veins of bilateral lower extremities with other complications: Secondary | ICD-10-CM

## 2013-12-16 ENCOUNTER — Other Ambulatory Visit: Payer: Self-pay | Admitting: Family Medicine

## 2013-12-27 ENCOUNTER — Encounter (HOSPITAL_COMMUNITY): Payer: PRIVATE HEALTH INSURANCE

## 2013-12-27 ENCOUNTER — Encounter: Payer: PRIVATE HEALTH INSURANCE | Admitting: Vascular Surgery

## 2014-01-02 ENCOUNTER — Ambulatory Visit: Payer: PRIVATE HEALTH INSURANCE | Admitting: Dietician

## 2014-01-07 ENCOUNTER — Other Ambulatory Visit: Payer: Self-pay | Admitting: Certified Nurse Midwife

## 2014-01-07 DIAGNOSIS — Z1231 Encounter for screening mammogram for malignant neoplasm of breast: Secondary | ICD-10-CM

## 2014-01-23 ENCOUNTER — Ambulatory Visit (HOSPITAL_COMMUNITY): Payer: PRIVATE HEALTH INSURANCE

## 2014-01-23 ENCOUNTER — Encounter: Payer: PRIVATE HEALTH INSURANCE | Admitting: Vascular Surgery

## 2014-01-23 ENCOUNTER — Encounter (HOSPITAL_COMMUNITY): Payer: PRIVATE HEALTH INSURANCE

## 2014-01-28 ENCOUNTER — Ambulatory Visit (HOSPITAL_COMMUNITY)
Admission: RE | Admit: 2014-01-28 | Discharge: 2014-01-28 | Disposition: A | Discharge status: Home / Self Care | Payer: Medicare HMO | Source: Ambulatory Visit | Attending: Certified Nurse Midwife | Admitting: Certified Nurse Midwife

## 2014-01-28 DIAGNOSIS — Z1231 Encounter for screening mammogram for malignant neoplasm of breast: Secondary | ICD-10-CM | POA: Insufficient documentation

## 2014-02-03 ENCOUNTER — Encounter: Payer: Self-pay | Admitting: Internal Medicine

## 2014-03-05 ENCOUNTER — Encounter: Payer: Self-pay | Admitting: Vascular Surgery

## 2014-03-06 ENCOUNTER — Ambulatory Visit (HOSPITAL_COMMUNITY)
Admission: RE | Admit: 2014-03-06 | Discharge: 2014-03-06 | Disposition: A | Discharge status: Home / Self Care | Payer: PRIVATE HEALTH INSURANCE | Source: Ambulatory Visit | Attending: Vascular Surgery | Admitting: Vascular Surgery

## 2014-03-06 ENCOUNTER — Ambulatory Visit (INDEPENDENT_AMBULATORY_CARE_PROVIDER_SITE_OTHER): Payer: Medicare HMO | Admitting: Vascular Surgery

## 2014-03-06 ENCOUNTER — Encounter: Payer: Self-pay | Admitting: Vascular Surgery

## 2014-03-06 VITALS — BP 135/74 | HR 66 | Resp 14 | Ht 65.5 in | Wt 183.0 lb

## 2014-03-06 DIAGNOSIS — I872 Venous insufficiency (chronic) (peripheral): Secondary | ICD-10-CM

## 2014-03-06 DIAGNOSIS — I8393 Asymptomatic varicose veins of bilateral lower extremities: Secondary | ICD-10-CM

## 2014-03-06 NOTE — Progress Notes (Signed)
[facility]  HISTORY AND PHYSICAL   History of Present Illness:  Patient is a 68 y.o. year old female who presents for evaluation of asymptomatic bilateral varicose veins. The patient states she has had varicose veins for as long as she can remember. She does have a family history of varicose veins in her father. She has had no prior procedures on her varicose veins. She denies any prior history of DVT or pulmonary embolus.  Other medical problems include arthritis, sleep apnea, chronic back pain and anxiety. These are all currently stable.  Past Medical History  Diagnosis Date  . Other and unspecified hyperlipidemia   . Depressive disorder, not elsewhere classified   . Osteoarthritis   . Sleep apnea      On CPAP  . History of fractured kneecap     right   . Fibroid   . Chicken pox 68 yrs old  . Measles as a child  . Allergic state 02/01/2010    Qualifier: Diagnosis of  By: Regis Bill MD, Standley Brooking   . Overweight(278.02) 10/05/2011  . Allergy   . Thyroid disease     h/o hyperthyroid treated with radioactive iodine? from 16 to 20  . Hyperglycemia   . Back pain   . OA (osteoarthritis) of knee     left knee  . Varicose vein of leg   . Sleep apnea   . Knee pain, left 10/05/2011    Follows with Dr Margarette Canada  . Anxiety   . Stye 11/19/2012  . Obesity 10/05/2011  . Diabetes     Past Surgical History  Procedure Laterality Date  . Back surgery      L5 discectomy, 2002. helpful  . Cpap    . Cesarean section  1987  . Knee surgery Right 2006    R knee fx. patella  . Leep      Social History History  Substance Use Topics  . Smoking status: Never Smoker   . Smokeless tobacco: Never Used  . Alcohol Use: 0.6 - 1.2 oz/week    1-2 Glasses of wine per week    Family History Family History  Problem Relation Age of Onset  . Hyperlipidemia Mother   . Dementia Mother   . Hyperlipidemia Father   . Heart disease Father     bypass, CAD  . Hyperlipidemia Sister   .  Diabetes Sister     type 2  . Dementia Maternal Grandmother     alzheimer  . Alcohol abuse Maternal Grandfather   . Diabetes Paternal Grandmother     type 2?    Allergies  Allergies  Allergen Reactions  . Codeine Other (See Comments)    hallucinations  . Crestor [Rosuvastatin]     REACTION: myalgias  . Amoxicillin Rash  . Erythromycin Other (See Comments)    cramps  . Sulfonamide Derivatives Rash     Current Outpatient Prescriptions  Medication Sig Dispense Refill  . Ascorbic Acid (VITAMIN C PO) Take 1 tablet by mouth daily. 500 mg    . Cholecalciferol (VITAMIN D PO) Take 1 tablet by mouth daily. 1000 units    . cycloSPORINE (RESTASIS) 0.05 % ophthalmic emulsion 1 drop daily.     . DULoxetine (CYMBALTA) 30 MG capsule TAKE ONE CAPSULE BY MOUTH ONE TIME DAILY  90 capsule 2  . Krill Oil CAPS Take 1 capsule by mouth daily. megared krill oil caps daily    . pravastatin (PRAVACHOL) 80 MG tablet Take 1 tablet (80  mg total) by mouth daily. 90 tablet 2   No current facility-administered medications for this visit.    ROS:   General:  No weight loss, Fever, chills  HEENT: No recent headaches, no nasal bleeding, no visual changes, no sore throat  Neurologic: No dizziness, blackouts, seizures. No recent symptoms of stroke or mini- stroke. No recent episodes of slurred speech, or temporary blindness.  Cardiac: No recent episodes of chest pain/pressure, no shortness of breath at rest.  No shortness of breath with exertion.  Denies history of atrial fibrillation or irregular heartbeat  Vascular: No history of rest pain in feet.  No history of claudication.  No history of non-healing ulcer, No history of DVT   Pulmonary: No home oxygen, no productive cough, no hemoptysis,  No asthma or wheezing  Musculoskeletal:  [x ] Arthritis, [x ] Low back pain,  [x ] Joint pain  Hematologic:No history of hypercoagulable state.  No history of easy bleeding.  No history of  anemia  Gastrointestinal: No hematochezia or melena,  No gastroesophageal reflux, no trouble swallowing  Urinary: [ ]  chronic Kidney disease, [ ]  on HD - [ ]  MWF or [ ]  TTHS, [ ]  Burning with urination, [ ]  Frequent urination, [ ]  Difficulty urinating;   Skin: No rashes  Psychological: + history of anxiety,  + history of depression   Physical Examination  Filed Vitals:   03/06/14 1421  BP: 135/74  Pulse: 66  Resp: 14  Height: 5' 5.5" (1.664 m)  Weight: 183 lb (83.008 kg)    Body mass index is 29.98 kg/(m^2).  General:  Alert and oriented, no acute distress HEENT: Normal Neck: No bruit or JVD Pulmonary: Clear to auscultation bilaterally Cardiac: Regular Rate and Rhythm without murmur Skin: No rash, bilateral scattered varicosities below the knee bilaterally. These measure in diameter from 4-6 mm. Several large clusters on the medial posterior aspect over the course of the greater saphenous vein. No ulceration. Extremity Pulses:  2+ radial, brachial, femoral, dorsalis pedis, posterior tibial pulses bilaterally Musculoskeletal: No deformity or edema  Neurologic: Upper and lower extremity motor 5/5 and symmetric  ASSESSMENT:  Bilateral lower extremity varicose veins. I discussed the symptoms of heaviness aching this fullness burning sensation with the patient today. She exhibits mildly symptoms currently. I discussed with the patient pathophysiology of varicose veins and how these are more recent symptoms and any sort of life-threatening or condition that would predispose her to DVT or pulmonary embolus. She currently is not interested in any laser ablation or intervention for her varicose veins. I did discuss with her that she would benefit from bilateral lower extremity compression stockings to avoid skin changes and possible ulceration down the road. She was agreeable to this.   PLAN:  Bilateral 20-30 mm compression stockings knee-high. Patient will follow-up on as-needed basis if  she wishes an intervention for her varicose veins in the future.  Ruta Hinds, MD [facility] [place] Office: [phone] Pager: [phone]

## 2014-04-21 ENCOUNTER — Encounter: Payer: PRIVATE HEALTH INSURANCE | Admitting: Family Medicine

## 2014-04-25 ENCOUNTER — Encounter: Payer: PRIVATE HEALTH INSURANCE | Admitting: Family Medicine

## 2014-06-24 ENCOUNTER — Telehealth: Payer: Self-pay

## 2014-06-24 NOTE — Telephone Encounter (Signed)
See speciality notes 

## 2014-06-26 ENCOUNTER — Encounter: Payer: Self-pay | Admitting: Family Medicine

## 2014-06-26 ENCOUNTER — Ambulatory Visit (INDEPENDENT_AMBULATORY_CARE_PROVIDER_SITE_OTHER): Payer: Medicare HMO | Admitting: Family Medicine

## 2014-06-26 VITALS — BP 130/70 | HR 50 | Temp 97.9°F | Resp 16 | Ht 64.75 in | Wt 171.4 lb

## 2014-06-26 DIAGNOSIS — E119 Type 2 diabetes mellitus without complications: Secondary | ICD-10-CM

## 2014-06-26 DIAGNOSIS — M8580 Other specified disorders of bone density and structure, unspecified site: Secondary | ICD-10-CM | POA: Insufficient documentation

## 2014-06-26 DIAGNOSIS — Z0000 Encounter for general adult medical examination without abnormal findings: Secondary | ICD-10-CM

## 2014-06-26 DIAGNOSIS — R030 Elevated blood-pressure reading, without diagnosis of hypertension: Secondary | ICD-10-CM

## 2014-06-26 DIAGNOSIS — E1169 Type 2 diabetes mellitus with other specified complication: Secondary | ICD-10-CM | POA: Diagnosis not present

## 2014-06-26 DIAGNOSIS — E782 Mixed hyperlipidemia: Secondary | ICD-10-CM

## 2014-06-26 DIAGNOSIS — E669 Obesity, unspecified: Secondary | ICD-10-CM

## 2014-06-26 DIAGNOSIS — G4733 Obstructive sleep apnea (adult) (pediatric): Secondary | ICD-10-CM

## 2014-06-26 DIAGNOSIS — E785 Hyperlipidemia, unspecified: Secondary | ICD-10-CM | POA: Diagnosis not present

## 2014-06-26 HISTORY — DX: Other specified disorders of bone density and structure, unspecified site: M85.80

## 2014-06-26 NOTE — Patient Instructions (Signed)
Preventive Care for Adults A healthy lifestyle and preventive care can promote health and wellness. Preventive health guidelines for women include the following key practices.  A routine yearly physical is a good way to check with your health care provider about your health and preventive screening. It is a chance to share any concerns and updates on your health and to receive a thorough exam.  Visit your dentist for a routine exam and preventive care every 6 months. Brush your teeth twice a day and floss once a day. Good oral hygiene prevents tooth decay and gum disease.  The frequency of eye exams is based on your age, health, family medical history, use of contact lenses, and other factors. Follow your health care provider's recommendations for frequency of eye exams.  Eat a healthy diet. Foods like vegetables, fruits, whole grains, low-fat dairy products, and lean protein foods contain the nutrients you need without too many calories. Decrease your intake of foods high in solid fats, added sugars, and salt. Eat the right amount of calories for you.Get information about a proper diet from your health care provider, if necessary.  Regular physical exercise is one of the most important things you can do for your health. Most adults should get at least 150 minutes of moderate-intensity exercise (any activity that increases your heart rate and causes you to sweat) each week. In addition, most adults need muscle-strengthening exercises on 2 or more days a week.  Maintain a healthy weight. The body mass index (BMI) is a screening tool to identify possible weight problems. It provides an estimate of body fat based on height and weight. Your health care provider can find your BMI and can help you achieve or maintain a healthy weight.For adults 20 years and older:  A BMI below 18.5 is considered underweight.  A BMI of 18.5 to 24.9 is normal.  A BMI of 25 to 29.9 is considered overweight.  A BMI of  30 and above is considered obese.  Maintain normal blood lipids and cholesterol levels by exercising and minimizing your intake of saturated fat. Eat a balanced diet with plenty of fruit and vegetables. Blood tests for lipids and cholesterol should begin at age 76 and be repeated every 5 years. If your lipid or cholesterol levels are high, you are over 50, or you are at high risk for heart disease, you may need your cholesterol levels checked more frequently.Ongoing high lipid and cholesterol levels should be treated with medicines if diet and exercise are not working.  If you smoke, find out from your health care provider how to quit. If you do not use tobacco, do not start.  Lung cancer screening is recommended for adults aged 22-80 years who are at high risk for developing lung cancer because of a history of smoking. A yearly low-dose CT scan of the lungs is recommended for people who have at least a 30-pack-year history of smoking and are a current smoker or have quit within the past 15 years. A pack year of smoking is smoking an average of 1 pack of cigarettes a day for 1 year (for example: 1 pack a day for 30 years or 2 packs a day for 15 years). Yearly screening should continue until the smoker has stopped smoking for at least 15 years. Yearly screening should be stopped for people who develop a health problem that would prevent them from having lung cancer treatment.  If you are pregnant, do not drink alcohol. If you are breastfeeding,  be very cautious about drinking alcohol. If you are not pregnant and choose to drink alcohol, do not have more than 1 drink per day. One drink is considered to be 12 ounces (355 mL) of beer, 5 ounces (148 mL) of wine, or 1.5 ounces (44 mL) of liquor.  Avoid use of street drugs. Do not share needles with anyone. Ask for help if you need support or instructions about stopping the use of drugs.  High blood pressure causes heart disease and increases the risk of  stroke. Your blood pressure should be checked at least every 1 to 2 years. Ongoing high blood pressure should be treated with medicines if weight loss and exercise do not work.  If you are 75-52 years old, ask your health care provider if you should take aspirin to prevent strokes.  Diabetes screening involves taking a blood sample to check your fasting blood sugar level. This should be done once every 3 years, after age 15, if you are within normal weight and without risk factors for diabetes. Testing should be considered at a younger age or be carried out more frequently if you are overweight and have at least 1 risk factor for diabetes.  Breast cancer screening is essential preventive care for women. You should practice "breast self-awareness." This means understanding the normal appearance and feel of your breasts and may include breast self-examination. Any changes detected, no matter how small, should be reported to a health care provider. Women in their 58s and 30s should have a clinical breast exam (CBE) by a health care provider as part of a regular health exam every 1 to 3 years. After age 16, women should have a CBE every year. Starting at age 53, women should consider having a mammogram (breast X-ray test) every year. Women who have a family history of breast cancer should talk to their health care provider about genetic screening. Women at a high risk of breast cancer should talk to their health care providers about having an MRI and a mammogram every year.  Breast cancer gene (BRCA)-related cancer risk assessment is recommended for women who have family members with BRCA-related cancers. BRCA-related cancers include breast, ovarian, tubal, and peritoneal cancers. Having family members with these cancers may be associated with an increased risk for harmful changes (mutations) in the breast cancer genes BRCA1 and BRCA2. Results of the assessment will determine the need for genetic counseling and  BRCA1 and BRCA2 testing.  Routine pelvic exams to screen for cancer are no longer recommended for nonpregnant women who are considered low risk for cancer of the pelvic organs (ovaries, uterus, and vagina) and who do not have symptoms. Ask your health care provider if a screening pelvic exam is right for you.  If you have had past treatment for cervical cancer or a condition that could lead to cancer, you need Pap tests and screening for cancer for at least 20 years after your treatment. If Pap tests have been discontinued, your risk factors (such as having a new sexual partner) need to be reassessed to determine if screening should be resumed. Some women have medical problems that increase the chance of getting cervical cancer. In these cases, your health care provider may recommend more frequent screening and Pap tests.  The HPV test is an additional test that may be used for cervical cancer screening. The HPV test looks for the virus that can cause the cell changes on the cervix. The cells collected during the Pap test can be  tested for HPV. The HPV test could be used to screen women aged 30 years and older, and should be used in women of any age who have unclear Pap test results. After the age of 30, women should have HPV testing at the same frequency as a Pap test.  Colorectal cancer can be detected and often prevented. Most routine colorectal cancer screening begins at the age of 50 years and continues through age 75 years. However, your health care provider may recommend screening at an earlier age if you have risk factors for colon cancer. On a yearly basis, your health care provider may provide home test kits to check for hidden blood in the stool. Use of a small camera at the end of a tube, to directly examine the colon (sigmoidoscopy or colonoscopy), can detect the earliest forms of colorectal cancer. Talk to your health care provider about this at age 50, when routine screening begins. Direct  exam of the colon should be repeated every 5-10 years through age 75 years, unless early forms of pre-cancerous polyps or small growths are found.  People who are at an increased risk for hepatitis B should be screened for this virus. You are considered at high risk for hepatitis B if:  You were born in a country where hepatitis B occurs often. Talk with your health care provider about which countries are considered high risk.  Your parents were born in a high-risk country and you have not received a shot to protect against hepatitis B (hepatitis B vaccine).  You have HIV or AIDS.  You use needles to inject street drugs.  You live with, or have sex with, someone who has hepatitis B.  You get hemodialysis treatment.  You take certain medicines for conditions like cancer, organ transplantation, and autoimmune conditions.  Hepatitis C blood testing is recommended for all people born from 1945 through 1965 and any individual with known risks for hepatitis C.  Practice safe sex. Use condoms and avoid high-risk sexual practices to reduce the spread of sexually transmitted infections (STIs). STIs include gonorrhea, chlamydia, syphilis, trichomonas, herpes, HPV, and human immunodeficiency virus (HIV). Herpes, HIV, and HPV are viral illnesses that have no cure. They can result in disability, cancer, and death.  You should be screened for sexually transmitted illnesses (STIs) including gonorrhea and chlamydia if:  You are sexually active and are younger than 24 years.  You are older than 24 years and your health care provider tells you that you are at risk for this type of infection.  Your sexual activity has changed since you were last screened and you are at an increased risk for chlamydia or gonorrhea. Ask your health care provider if you are at risk.  If you are at risk of being infected with HIV, it is recommended that you take a prescription medicine daily to prevent HIV infection. This is  called preexposure prophylaxis (PrEP). You are considered at risk if:  You are a heterosexual woman, are sexually active, and are at increased risk for HIV infection.  You take drugs by injection.  You are sexually active with a partner who has HIV.  Talk with your health care provider about whether you are at high risk of being infected with HIV. If you choose to begin PrEP, you should first be tested for HIV. You should then be tested every 3 months for as long as you are taking PrEP.  Osteoporosis is a disease in which the bones lose minerals and strength   with aging. This can result in serious bone fractures or breaks. The risk of osteoporosis can be identified using a bone density scan. Women ages 65 years and over and women at risk for fractures or osteoporosis should discuss screening with their health care providers. Ask your health care provider whether you should take a calcium supplement or vitamin D to reduce the rate of osteoporosis.  Menopause can be associated with physical symptoms and risks. Hormone replacement therapy is available to decrease symptoms and risks. You should talk to your health care provider about whether hormone replacement therapy is right for you.  Use sunscreen. Apply sunscreen liberally and repeatedly throughout the day. You should seek shade when your shadow is shorter than you. Protect yourself by wearing long sleeves, pants, a wide-brimmed hat, and sunglasses year round, whenever you are outdoors.  Once a month, do a whole body skin exam, using a mirror to look at the skin on your back. Tell your health care provider of new moles, moles that have irregular borders, moles that are larger than a pencil eraser, or moles that have changed in shape or color.  Stay current with required vaccines (immunizations).  Influenza vaccine. All adults should be immunized every year.  Tetanus, diphtheria, and acellular pertussis (Td, Tdap) vaccine. Pregnant women should  receive 1 dose of Tdap vaccine during each pregnancy. The dose should be obtained regardless of the length of time since the last dose. Immunization is preferred during the 27th-36th week of gestation. An adult who has not previously received Tdap or who does not know her vaccine status should receive 1 dose of Tdap. This initial dose should be followed by tetanus and diphtheria toxoids (Td) booster doses every 10 years. Adults with an unknown or incomplete history of completing a 3-dose immunization series with Td-containing vaccines should begin or complete a primary immunization series including a Tdap dose. Adults should receive a Td booster every 10 years.  Varicella vaccine. An adult without evidence of immunity to varicella should receive 2 doses or a second dose if she has previously received 1 dose. Pregnant females who do not have evidence of immunity should receive the first dose after pregnancy. This first dose should be obtained before leaving the health care facility. The second dose should be obtained 4-8 weeks after the first dose.  Human papillomavirus (HPV) vaccine. Females aged 13-26 years who have not received the vaccine previously should obtain the 3-dose series. The vaccine is not recommended for use in pregnant females. However, pregnancy testing is not needed before receiving a dose. If a female is found to be pregnant after receiving a dose, no treatment is needed. In that case, the remaining doses should be delayed until after the pregnancy. Immunization is recommended for any person with an immunocompromised condition through the age of 26 years if she did not get any or all doses earlier. During the 3-dose series, the second dose should be obtained 4-8 weeks after the first dose. The third dose should be obtained 24 weeks after the first dose and 16 weeks after the second dose.  Zoster vaccine. One dose is recommended for adults aged 60 years or older unless certain conditions are  present.  Measles, mumps, and rubella (MMR) vaccine. Adults born before 1957 generally are considered immune to measles and mumps. Adults born in 1957 or later should have 1 or more doses of MMR vaccine unless there is a contraindication to the vaccine or there is laboratory evidence of immunity to   each of the three diseases. A routine second dose of MMR vaccine should be obtained at least 28 days after the first dose for students attending postsecondary schools, health care workers, or international travelers. People who received inactivated measles vaccine or an unknown type of measles vaccine during 1963-1967 should receive 2 doses of MMR vaccine. People who received inactivated mumps vaccine or an unknown type of mumps vaccine before 1979 and are at high risk for mumps infection should consider immunization with 2 doses of MMR vaccine. For females of childbearing age, rubella immunity should be determined. If there is no evidence of immunity, females who are not pregnant should be vaccinated. If there is no evidence of immunity, females who are pregnant should delay immunization until after pregnancy. Unvaccinated health care workers born before 1957 who lack laboratory evidence of measles, mumps, or rubella immunity or laboratory confirmation of disease should consider measles and mumps immunization with 2 doses of MMR vaccine or rubella immunization with 1 dose of MMR vaccine.  Pneumococcal 13-valent conjugate (PCV13) vaccine. When indicated, a person who is uncertain of her immunization history and has no record of immunization should receive the PCV13 vaccine. An adult aged 19 years or older who has certain medical conditions and has not been previously immunized should receive 1 dose of PCV13 vaccine. This PCV13 should be followed with a dose of pneumococcal polysaccharide (PPSV23) vaccine. The PPSV23 vaccine dose should be obtained at least 8 weeks after the dose of PCV13 vaccine. An adult aged 19  years or older who has certain medical conditions and previously received 1 or more doses of PPSV23 vaccine should receive 1 dose of PCV13. The PCV13 vaccine dose should be obtained 1 or more years after the last PPSV23 vaccine dose.  Pneumococcal polysaccharide (PPSV23) vaccine. When PCV13 is also indicated, PCV13 should be obtained first. All adults aged 65 years and older should be immunized. An adult younger than age 65 years who has certain medical conditions should be immunized. Any person who resides in a nursing home or long-term care facility should be immunized. An adult smoker should be immunized. People with an immunocompromised condition and certain other conditions should receive both PCV13 and PPSV23 vaccines. People with human immunodeficiency virus (HIV) infection should be immunized as soon as possible after diagnosis. Immunization during chemotherapy or radiation therapy should be avoided. Routine use of PPSV23 vaccine is not recommended for American Indians, [place] Natives, or people younger than 65 years unless there are medical conditions that require PPSV23 vaccine. When indicated, people who have unknown immunization and have no record of immunization should receive PPSV23 vaccine. One-time revaccination 5 years after the first dose of PPSV23 is recommended for people aged 19-64 years who have chronic kidney failure, nephrotic syndrome, asplenia, or immunocompromised conditions. People who received 1-2 doses of PPSV23 before age 65 years should receive another dose of PPSV23 vaccine at age 65 years or later if at least 5 years have passed since the previous dose. Doses of PPSV23 are not needed for people immunized with PPSV23 at or after age 65 years.  Meningococcal vaccine. Adults with asplenia or persistent complement component deficiencies should receive 2 doses of quadrivalent meningococcal conjugate (MenACWY-D) vaccine. The doses should be obtained at least 2 months apart.  Microbiologists working with certain meningococcal bacteria, military recruits, people at risk during an outbreak, and people who travel to or live in countries with a high rate of meningitis should be immunized. A first-year college student up through age   21 years who is living in a residence hall should receive a dose if she did not receive a dose on or after her 16th birthday. Adults who have certain high-risk conditions should receive one or more doses of vaccine.  Hepatitis A vaccine. Adults who wish to be protected from this disease, have certain high-risk conditions, work with hepatitis A-infected animals, work in hepatitis A research labs, or travel to or work in countries with a high rate of hepatitis A should be immunized. Adults who were previously unvaccinated and who anticipate close contact with an international adoptee during the first 60 days after arrival in the [place] from a country with a high rate of hepatitis A should be immunized.  Hepatitis B vaccine. Adults who wish to be protected from this disease, have certain high-risk conditions, may be exposed to blood or other infectious body fluids, are household contacts or sex partners of hepatitis B positive people, are clients or workers in certain care facilities, or travel to or work in countries with a high rate of hepatitis B should be immunized.  Haemophilus influenzae type b (Hib) vaccine. A previously unvaccinated person with asplenia or sickle cell disease or having a scheduled splenectomy should receive 1 dose of Hib vaccine. Regardless of previous immunization, a recipient of a hematopoietic stem cell transplant should receive a 3-dose series 6-12 months after her successful transplant. Hib vaccine is not recommended for adults with HIV infection. Preventive Services / Frequency Ages 64 to 68 years  Blood pressure check.** / Every 1 to 2 years.  Lipid and cholesterol check.** / Every 5 years beginning at age  22.  Clinical breast exam.** / Every 3 years for women in their 88s and 53s.  BRCA-related cancer risk assessment.** / For women who have family members with a BRCA-related cancer (breast, ovarian, tubal, or peritoneal cancers).  Pap test.** / Every 2 years from ages 90 through 51. Every 3 years starting at age 21 through age 56 or 3 with a history of 3 consecutive normal Pap tests.  HPV screening.** / Every 3 years from ages 24 through ages 1 to 46 with a history of 3 consecutive normal Pap tests.  Hepatitis C blood test.** / For any individual with known risks for hepatitis C.  Skin self-exam. / Monthly.  Influenza vaccine. / Every year.  Tetanus, diphtheria, and acellular pertussis (Tdap, Td) vaccine.** / Consult your health care provider. Pregnant women should receive 1 dose of Tdap vaccine during each pregnancy. 1 dose of Td every 10 years.  Varicella vaccine.** / Consult your health care provider. Pregnant females who do not have evidence of immunity should receive the first dose after pregnancy.  HPV vaccine. / 3 doses over 6 months, if 72 and younger. The vaccine is not recommended for use in pregnant females. However, pregnancy testing is not needed before receiving a dose.  Measles, mumps, rubella (MMR) vaccine.** / You need at least 1 dose of MMR if you were born in 1957 or later. You may also need a 2nd dose. For females of childbearing age, rubella immunity should be determined. If there is no evidence of immunity, females who are not pregnant should be vaccinated. If there is no evidence of immunity, females who are pregnant should delay immunization until after pregnancy.  Pneumococcal 13-valent conjugate (PCV13) vaccine.** / Consult your health care provider.  Pneumococcal polysaccharide (PPSV23) vaccine.** / 1 to 2 doses if you smoke cigarettes or if you have certain conditions.  Meningococcal vaccine.** /  1 dose if you are age 19 to 21 years and a first-year college  student living in a residence hall, or have one of several medical conditions, you need to get vaccinated against meningococcal disease. You may also need additional booster doses.  Hepatitis A vaccine.** / Consult your health care provider.  Hepatitis B vaccine.** / Consult your health care provider.  Haemophilus influenzae type b (Hib) vaccine.** / Consult your health care provider. Ages 40 to 64 years  Blood pressure check.** / Every 1 to 2 years.  Lipid and cholesterol check.** / Every 5 years beginning at age 20 years.  Lung cancer screening. / Every year if you are aged 55-80 years and have a 30-pack-year history of smoking and currently smoke or have quit within the past 15 years. Yearly screening is stopped once you have quit smoking for at least 15 years or develop a health problem that would prevent you from having lung cancer treatment.  Clinical breast exam.** / Every year after age 40 years.  BRCA-related cancer risk assessment.** / For women who have family members with a BRCA-related cancer (breast, ovarian, tubal, or peritoneal cancers).  Mammogram.** / Every year beginning at age 40 years and continuing for as long as you are in good health. Consult with your health care provider.  Pap test.** / Every 3 years starting at age 30 years through age 65 or 70 years with a history of 3 consecutive normal Pap tests.  HPV screening.** / Every 3 years from ages 30 years through ages 65 to 70 years with a history of 3 consecutive normal Pap tests.  Fecal occult blood test (FOBT) of stool. / Every year beginning at age 50 years and continuing until age 75 years. You may not need to do this test if you get a colonoscopy every 10 years.  Flexible sigmoidoscopy or colonoscopy.** / Every 5 years for a flexible sigmoidoscopy or every 10 years for a colonoscopy beginning at age 50 years and continuing until age 75 years.  Hepatitis C blood test.** / For all people born from 1945 through  1965 and any individual with known risks for hepatitis C.  Skin self-exam. / Monthly.  Influenza vaccine. / Every year.  Tetanus, diphtheria, and acellular pertussis (Tdap/Td) vaccine.** / Consult your health care provider. Pregnant women should receive 1 dose of Tdap vaccine during each pregnancy. 1 dose of Td every 10 years.  Varicella vaccine.** / Consult your health care provider. Pregnant females who do not have evidence of immunity should receive the first dose after pregnancy.  Zoster vaccine.** / 1 dose for adults aged 60 years or older.  Measles, mumps, rubella (MMR) vaccine.** / You need at least 1 dose of MMR if you were born in 1957 or later. You may also need a 2nd dose. For females of childbearing age, rubella immunity should be determined. If there is no evidence of immunity, females who are not pregnant should be vaccinated. If there is no evidence of immunity, females who are pregnant should delay immunization until after pregnancy.  Pneumococcal 13-valent conjugate (PCV13) vaccine.** / Consult your health care provider.  Pneumococcal polysaccharide (PPSV23) vaccine.** / 1 to 2 doses if you smoke cigarettes or if you have certain conditions.  Meningococcal vaccine.** / Consult your health care provider.  Hepatitis A vaccine.** / Consult your health care provider.  Hepatitis B vaccine.** / Consult your health care provider.  Haemophilus influenzae type b (Hib) vaccine.** / Consult your health care provider. Ages 65   years and over  Blood pressure check.** / Every 1 to 2 years.  Lipid and cholesterol check.** / Every 5 years beginning at age 22 years.  Lung cancer screening. / Every year if you are aged 73-80 years and have a 30-pack-year history of smoking and currently smoke or have quit within the past 15 years. Yearly screening is stopped once you have quit smoking for at least 15 years or develop a health problem that would prevent you from having lung cancer  treatment.  Clinical breast exam.** / Every year after age 4 years.  BRCA-related cancer risk assessment.** / For women who have family members with a BRCA-related cancer (breast, ovarian, tubal, or peritoneal cancers).  Mammogram.** / Every year beginning at age 40 years and continuing for as long as you are in good health. Consult with your health care provider.  Pap test.** / Every 3 years starting at age 9 years through age 34 or 91 years with 3 consecutive normal Pap tests. Testing can be stopped between 65 and 70 years with 3 consecutive normal Pap tests and no abnormal Pap or HPV tests in the past 10 years.  HPV screening.** / Every 3 years from ages 57 years through ages 64 or 45 years with a history of 3 consecutive normal Pap tests. Testing can be stopped between 65 and 70 years with 3 consecutive normal Pap tests and no abnormal Pap or HPV tests in the past 10 years.  Fecal occult blood test (FOBT) of stool. / Every year beginning at age 15 years and continuing until age 17 years. You may not need to do this test if you get a colonoscopy every 10 years.  Flexible sigmoidoscopy or colonoscopy.** / Every 5 years for a flexible sigmoidoscopy or every 10 years for a colonoscopy beginning at age 86 years and continuing until age 71 years.  Hepatitis C blood test.** / For all people born from [birth year] through 1963 and any individual with known risks for hepatitis C.  Osteoporosis screening.** / A one-time screening for women ages 83 years and over and women at risk for fractures or osteoporosis.  Skin self-exam. / Monthly.  Influenza vaccine. / Every year.  Tetanus, diphtheria, and acellular pertussis (Tdap/Td) vaccine.** / 1 dose of Td every 10 years.  Varicella vaccine.** / Consult your health care provider.  Zoster vaccine.** / 1 dose for adults aged 61 years or older.  Pneumococcal 13-valent conjugate (PCV13) vaccine.** / Consult your health care provider.  Pneumococcal  polysaccharide (PPSV23) vaccine.** / 1 dose for all adults aged 28 years and older.  Meningococcal vaccine.** / Consult your health care provider.  Hepatitis A vaccine.** / Consult your health care provider.  Hepatitis B vaccine.** / Consult your health care provider.  Haemophilus influenzae type b (Hib) vaccine.** / Consult your health care provider. ** Family history and personal history of risk and conditions may change your health care provider's recommendations. Document Released: 05/17/2001 Document Revised: 08/05/2013 Document Reviewed: 08/16/2010 [facility] Patient Information 2015 [place], [place]. This information is not intended to replace advice given to you by your health care provider. Make sure you discuss any questions you have with your health care provider.

## 2014-06-26 NOTE — Progress Notes (Signed)
Pre visit review using our clinic review tool, if applicable. No additional management support is needed unless otherwise documented below in the visit note. 

## 2014-07-04 ENCOUNTER — Other Ambulatory Visit (INDEPENDENT_AMBULATORY_CARE_PROVIDER_SITE_OTHER): Payer: Medicare HMO

## 2014-07-04 DIAGNOSIS — E782 Mixed hyperlipidemia: Secondary | ICD-10-CM

## 2014-07-04 DIAGNOSIS — E119 Type 2 diabetes mellitus without complications: Secondary | ICD-10-CM

## 2014-07-04 DIAGNOSIS — R030 Elevated blood-pressure reading, without diagnosis of hypertension: Secondary | ICD-10-CM | POA: Diagnosis not present

## 2014-07-04 LAB — COMPREHENSIVE METABOLIC PANEL
ALK PHOS: 81 U/L (ref 39–117)
ALT: 14 U/L (ref 0–35)
AST: 17 U/L (ref 0–37)
Albumin: 3.9 g/dL (ref 3.5–5.2)
BILIRUBIN TOTAL: 0.6 mg/dL (ref 0.2–1.2)
BUN: 20 mg/dL (ref 6–23)
CO2: 31 mEq/L (ref 19–32)
Calcium: 9.7 mg/dL (ref 8.4–10.5)
Chloride: 105 mEq/L (ref 96–112)
Creatinine, Ser: 0.78 mg/dL (ref 0.40–1.20)
GFR: 78.3 mL/min (ref >60.00)
GLUCOSE: 97 mg/dL (ref 70–99)
POTASSIUM: 4.6 meq/L (ref 3.5–5.1)
Sodium: 138 mEq/L (ref 135–145)
Total Protein: 6.8 g/dL (ref 6.0–8.3)

## 2014-07-04 LAB — LIPID PANEL
CHOLESTEROL: 168 mg/dL (ref 0–200)
HDL: 37.9 mg/dL — ABNORMAL LOW (ref >39.00)
LDL Cholesterol: 106 mg/dL — ABNORMAL HIGH (ref 0–99)
NonHDL: 130.1
TRIGLYCERIDES: 119 mg/dL (ref 0.0–149.0)
Total CHOL/HDL Ratio: 4
VLDL: 23.8 mg/dL (ref 0.0–40.0)

## 2014-07-04 LAB — CBC
HCT: 39.9 % (ref 36.0–46.0)
Hemoglobin: 13.6 g/dL (ref 12.0–15.0)
MCHC: 34 g/dL (ref 30.0–36.0)
MCV: 87.4 fl (ref 78.0–100.0)
PLATELETS: 283 10*3/uL (ref 150.0–400.0)
RBC: 4.56 Mil/uL (ref 3.87–5.11)
RDW: 14.3 % (ref 11.5–15.5)
WBC: 7.5 10*3/uL (ref 4.0–10.5)

## 2014-07-04 LAB — TSH: TSH: 0.96 u[IU]/mL (ref 0.35–4.50)

## 2014-07-04 LAB — HEMOGLOBIN A1C: HEMOGLOBIN A1C: 6.2 % (ref 4.6–6.5)

## 2014-07-06 ENCOUNTER — Encounter: Payer: Self-pay | Admitting: Family Medicine

## 2014-07-06 DIAGNOSIS — Z0000 Encounter for general adult medical examination without abnormal findings: Secondary | ICD-10-CM | POA: Insufficient documentation

## 2014-07-06 HISTORY — DX: Encounter for general adult medical examination without abnormal findings: Z00.00

## 2014-07-06 NOTE — Assessment & Plan Note (Signed)
Uses [facility] for equipment, no recent concerns, needs new supplies

## 2014-07-06 NOTE — Progress Notes (Signed)
Cindy [name] [id number] 12-17-1945 07/06/2014      Progress Note New Patient  Subjective  Chief Complaint  Chief Complaint  Patient presents with  . Medicare Wellness    initial    HPI  Patient is a 69 year old female in today for routine medical care. Patient is in today for follow-up on numerous conditions an annual exam. Overall is doing well. Her divorce is Denies CP/palp/SOB/HA/congestion/fevers/GI or GU c/o. Taking meds as prescribed nearly finalized after 2 and half years of separation and she is happy this is nearly come to the conclusion. She's had no recent acute illness. Allergies have flared somewhat this spring but are manageable. No significant respiratory symptoms noted.  Past Medical History  Diagnosis Date  . Other and unspecified hyperlipidemia   . Depressive disorder, not elsewhere classified   . Osteoarthritis   . Sleep apnea      On CPAP  . History of fractured kneecap     right   . Fibroid   . Chicken pox 69 yrs old  . Measles as a child  . Allergic state 02/01/2010    Qualifier: Diagnosis of  By: Regis Bill MD, Standley Brooking   . Overweight(278.02) 10/05/2011  . Allergy   . Thyroid disease     h/o hyperthyroid treated with radioactive iodine? from 16 to 20  . Hyperglycemia   . Back pain   . OA (osteoarthritis) of knee     left knee  . Varicose vein of leg   . Sleep apnea   . Knee pain, left 10/05/2011    Follows with Dr Margarette Canada  . Anxiety   . Stye 11/19/2012  . Obesity 10/05/2011  . Diabetes   . Osteopenia 06/26/2014    Past Surgical History  Procedure Laterality Date  . Back surgery      L5 discectomy, 2002. helpful  . Cpap    . Cesarean section  1987  . Knee surgery Right 2006    R knee fx. patella  . Leep      Family History  Problem Relation Age of Onset  . Hyperlipidemia Mother   . Dementia Mother   . Hyperlipidemia Father   . Heart disease Father     bypass, CAD  . Hyperlipidemia Sister   . Diabetes Sister     type 2  . Dementia  Maternal Grandmother     alzheimer  . Alcohol abuse Maternal Grandfather   . Diabetes Paternal Grandmother     type 2?    History   Social History  . Marital Status: Legally Separated    Spouse Name: N/A  . Number of Children: N/A  . Years of Education: N/A   Occupational History  . Not on file.   Social History Main Topics  . Smoking status: Never Smoker   . Smokeless tobacco: Never Used  . Alcohol Use: 0.6 - 1.2 oz/week    1-2 Glasses of wine per week  . Drug Use: No  . Sexual Activity: No   Other Topics Concern  . Not on file   Social History Narrative   Recently divorced separated fall 2012    Now living with friend in [place] area .   Never smoker   Regular exercise   Sleep 01/12/29 up at 6; job hours irreg              Current Outpatient Prescriptions on File Prior to Visit  Medication Sig Dispense Refill  . Ascorbic Acid (VITAMIN  C PO) Take 1 tablet by mouth daily. 500 mg    . Cholecalciferol (VITAMIN D PO) Take 1 tablet by mouth daily. 1000 units    . cycloSPORINE (RESTASIS) 0.05 % ophthalmic emulsion 1 drop daily.     . DULoxetine (CYMBALTA) 30 MG capsule TAKE ONE CAPSULE BY MOUTH ONE TIME DAILY  90 capsule 2  . Krill Oil CAPS Take 1 capsule by mouth daily. megared krill oil caps daily    . pravastatin (PRAVACHOL) 80 MG tablet Take 1 tablet (80 mg total) by mouth daily. 90 tablet 2   No current facility-administered medications on file prior to visit.    Allergies  Allergen Reactions  . Codeine Other (See Comments)    hallucinations  . Crestor [Rosuvastatin]     REACTION: myalgias  . Amoxicillin Rash  . Erythromycin Other (See Comments)    cramps  . Sulfonamide Derivatives Rash    Review of Systems  Review of Systems  Constitutional: Negative for fever and malaise/fatigue.  HENT: Positive for congestion.   Eyes: Positive for blurred vision. Negative for discharge.  Respiratory: Negative for shortness of breath.   Cardiovascular:  Negative for chest pain, palpitations and leg swelling.  Gastrointestinal: Negative for nausea, abdominal pain and diarrhea.  Genitourinary: Negative for dysuria.  Musculoskeletal: Negative for falls.  Skin: Negative for rash.  Neurological: Negative for loss of consciousness and headaches.  Endo/Heme/Allergies: Negative for polydipsia.  Psychiatric/Behavioral: Negative for depression and suicidal ideas. The patient is nervous/anxious. The patient does not have insomnia.     Objective  BP 130/70 mmHg  Pulse 50  Temp(Src) 97.9 F (36.6 C) (Oral)  Resp 16  Ht 5' 4.75" (1.645 m)  Wt 171 lb 6 oz (77.735 kg)  BMI 28.73 kg/m2  SpO2 98%  LMP 04/04/2000  Physical Exam  Physical Exam  Constitutional: She is oriented to person, place, and time and well-developed, well-nourished, and in no distress. No distress.  HENT:  Head: Normocephalic and atraumatic.  Eyes: Conjunctivae are normal. No scleral icterus.  Neck: Neck supple. No thyromegaly present.  Cardiovascular: Normal rate, regular rhythm and normal heart sounds.   No murmur heard. Pulmonary/Chest: Effort normal and breath sounds normal. She has no wheezes.  Abdominal: Soft. Bowel sounds are normal. She exhibits no distension and no mass.  Musculoskeletal: She exhibits no edema.  Lymphadenopathy:    She has no cervical adenopathy.  Neurological: She is alert and oriented to person, place, and time.  Skin: Skin is warm and dry. No rash noted. She is not diaphoretic.  Psychiatric: Memory, affect and judgment normal.       Assessment & Plan  Hyperlipidemia, mixed Tolerating statin, encouraged heart healthy diet, avoid trans fats, minimize simple carbs and saturated fats. Increase exercise as tolerated   Overweight Encouraged DASH diet, decrease po intake and increase exercise as tolerated. Needs 7-8 hours of sleep nightly. Avoid trans fats, eat small, frequent meals every 4-5 hours with lean proteins, complex carbs and  healthy fats. Minimize simple carbs, GMO foods.   INCREASED BLOOD PRESSURE Well controlled. Encouraged heart healthy diet such as the DASH diet and exercise as tolerated.    Type 2 diabetes mellitus with hyperlipidemia hgba1c acceptable, minimize simple carbs. Increase exercise as tolerated. Continue current meds   Medicare annual wellness visit, subsequent Patient denies any difficulties at home. No trouble with ADLs, depression or falls. No recent changes to vision or hearing. Is UTD with immunizations. Is UTD with screening. Discussed Advanced Directives, patient agrees  to bring [name] copies of documents if can. Encouraged heart healthy diet, exercise as tolerated and adequate sleep. Follow with gynecology Follows with pulmonology  Dr Annamaria Boots and Dr Halford Chessman in past Follow with Dr Oneida Alar in vascular surgery [facility] in 2015 repeat in  2 years colonoscopy up to date in 2009 repeat in 2019 Dexa scan last in 2009 Dexa scan ordered today, she will check with her insurance regarding coverage of Prevnar if they cover she will return to have shot Follows with [facility] dermatology Follows with opthamology Dr Donato Heinz   Preventative health care Patient encouraged to maintain heart healthy diet, regular exercise, adequate sleep. Consider daily probiotics. Take medications as prescribed. Will check with insurance regarding Prevnar   Obstructive sleep apnea Uses [facility] for equipment, no recent concerns, needs new supplies

## 2014-07-06 NOTE — Assessment & Plan Note (Signed)
Well controlled. Encouraged heart healthy diet such as the DASH diet and exercise as tolerated.  

## 2014-07-06 NOTE — Assessment & Plan Note (Signed)
Encouraged DASH diet, decrease po intake and increase exercise as tolerated. Needs 7-8 hours of sleep nightly. Avoid trans fats, eat small, frequent meals every 4-5 hours with lean proteins, complex carbs and healthy fats. Minimize simple carbs, GMO foods. 

## 2014-07-06 NOTE — Assessment & Plan Note (Signed)
Tolerating statin, encouraged heart healthy diet, avoid trans fats, minimize simple carbs and saturated fats. Increase exercise as tolerated 

## 2014-07-06 NOTE — Assessment & Plan Note (Signed)
hgba1c acceptable, minimize simple carbs. Increase exercise as tolerated. Continue current meds 

## 2014-07-06 NOTE — Assessment & Plan Note (Signed)
Patient encouraged to maintain heart healthy diet, regular exercise, adequate sleep. Consider daily probiotics. Take medications as prescribed. Will check with insurance regarding Prevnar

## 2014-07-06 NOTE — Assessment & Plan Note (Addendum)
Patient denies any difficulties at home. No trouble with ADLs, depression or falls. No recent changes to vision or hearing. Is UTD with immunizations. Is UTD with screening. Discussed Advanced Directives, patient agrees to bring [name] copies of documents if can. Encouraged heart healthy diet, exercise as tolerated and adequate sleep. Follow with gynecology Follows with pulmonology  Dr Annamaria Boots and Dr Halford Chessman in past Follow with Dr Oneida Alar in vascular surgery [facility] in 2015 repeat in  2 years colonoscopy up to date in 2009 repeat in 2019 Dexa scan last in 2009 Dexa scan ordered today, she will check with her insurance regarding coverage of Prevnar if they cover she will return to have shot Follows with [facility] dermatology Follows with opthamology Dr Donato Heinz

## 2014-07-16 ENCOUNTER — Telehealth: Payer: Self-pay | Admitting: Family Medicine

## 2014-07-16 NOTE — Telephone Encounter (Signed)
Caller name:Dattilio, Lakiesha Relation to OI:NOMV Call back number:[phone] Pharmacy:  Reason for call: pt states dr. Charlett Blake informed her to make her aware when she had her labs done, pt states she had the labs done on 07/04/14 at the [place] office.

## 2014-07-17 NOTE — Telephone Encounter (Signed)
Notify labs look good, sugar is OK hgba1c is up slightly but not enough to be concerned. Just watch carbohydrates

## 2014-07-18 NOTE — Telephone Encounter (Signed)
Called the patient informed of lab results.

## 2014-07-18 NOTE — Telephone Encounter (Signed)
Called left message to call back 

## 2014-07-22 ENCOUNTER — Ambulatory Visit (INDEPENDENT_AMBULATORY_CARE_PROVIDER_SITE_OTHER)
Admission: RE | Admit: 2014-07-22 | Discharge: 2014-07-22 | Disposition: A | Discharge status: Home / Self Care | Payer: Medicare HMO | Source: Ambulatory Visit | Attending: Family Medicine | Admitting: Family Medicine

## 2014-07-22 DIAGNOSIS — M8580 Other specified disorders of bone density and structure, unspecified site: Secondary | ICD-10-CM | POA: Diagnosis not present

## 2014-07-24 ENCOUNTER — Telehealth: Payer: Self-pay | Admitting: Family Medicine

## 2014-07-24 NOTE — Telephone Encounter (Signed)
Patient contacted and informed of Bone Density results and PCP instructions.  The patient verbalized understanding of results and OTC to take daily

## 2014-07-24 NOTE — Telephone Encounter (Signed)
-----   Message from Mosie Lukes, MD sent at 07/23/2014  2:00 PM EDT ----- Regarding: bone density Somehow I completed this instead of sending note. Please notify patient her bone density is stable showing osteopenia. Encourage her to take vitamin D 2000 IU caps 1 daily and citracal daily. Increase exercise as tolerated

## 2014-08-06 ENCOUNTER — Encounter (HOSPITAL_COMMUNITY): Payer: Self-pay | Admitting: Emergency Medicine

## 2014-08-06 ENCOUNTER — Emergency Department (HOSPITAL_COMMUNITY)
Admission: EM | Admit: 2014-08-06 | Discharge: 2014-08-06 | Disposition: A | Discharge status: Home / Self Care | Payer: Medicare HMO | Source: Home / Self Care | Attending: Family Medicine | Admitting: Family Medicine

## 2014-08-06 DIAGNOSIS — J00 Acute nasopharyngitis [common cold]: Secondary | ICD-10-CM

## 2014-08-06 DIAGNOSIS — R0981 Nasal congestion: Secondary | ICD-10-CM

## 2014-08-06 MED ORDER — IPRATROPIUM BROMIDE 0.06 % NA SOLN: 2.0000 | Freq: Four times a day (QID) | NASAL | Status: DC

## 2014-08-06 NOTE — ED Notes (Signed)
C/o sinusitis  States she has nasal drip, scratchy throat and nasal congestion

## 2014-08-06 NOTE — Discharge Instructions (Signed)
Upper Respiratory Infection, Adult An upper respiratory infection (URI) is also sometimes known as the common cold. The upper respiratory tract includes the nose, sinuses, throat, trachea, and bronchi. Bronchi are the airways leading to the lungs. Most people improve within 1 week, but symptoms can last up to 2 weeks. A residual cough may last even longer.  CAUSES Many different viruses can infect the tissues lining the upper respiratory tract. The tissues become irritated and inflamed and often become very moist. Mucus production is also common. A cold is contagious. You can easily spread the virus to others by oral contact. This includes kissing, sharing a glass, coughing, or sneezing. Touching your mouth or nose and then touching a surface, which is then touched by another person, can also spread the virus. SYMPTOMS  Symptoms typically develop 1 to 3 days after you come in contact with a cold virus. Symptoms vary from person to person. They may include:  Runny nose.  Sneezing.  Nasal congestion.  Sinus irritation.  Sore throat.  Loss of voice (laryngitis).  Cough.  Fatigue.  Muscle aches.  Loss of appetite.  Headache.  Low-grade fever. DIAGNOSIS  You might diagnose your own cold based on familiar symptoms, since most people get a cold 2 to 3 times a year. Your caregiver can confirm this based on your exam. Most importantly, your caregiver can check that your symptoms are not due to another disease such as strep throat, sinusitis, pneumonia, asthma, or epiglottitis. Blood tests, throat tests, and X-rays are not necessary to diagnose a common cold, but they may sometimes be helpful in excluding other more serious diseases. Your caregiver will decide if any further tests are required. RISKS AND COMPLICATIONS  You may be at risk for a more severe case of the common cold if you smoke cigarettes, have chronic heart disease (such as heart failure) or lung disease (such as asthma), or if  you have a weakened immune system. The very young and very old are also at risk for more serious infections. Bacterial sinusitis, middle ear infections, and bacterial pneumonia can complicate the common cold. The common cold can worsen asthma and chronic obstructive pulmonary disease (COPD). Sometimes, these complications can require emergency medical care and may be life-threatening. PREVENTION  The best way to protect against getting a cold is to practice good hygiene. Avoid oral or hand contact with people with cold symptoms. Wash your hands often if contact occurs. There is no clear evidence that vitamin C, vitamin E, echinacea, or exercise reduces the chance of developing a cold. However, it is always recommended to get plenty of rest and practice good nutrition. TREATMENT  Treatment is directed at relieving symptoms. There is no cure. Antibiotics are not effective, because the infection is caused by a virus, not by bacteria. Treatment may include:  Increased fluid intake. Sports drinks offer valuable electrolytes, sugars, and fluids.  Breathing heated mist or steam (vaporizer or shower).  Eating chicken soup or other clear broths, and maintaining good nutrition.  Getting plenty of rest.  Using gargles or lozenges for comfort.  Controlling fevers with ibuprofen or acetaminophen as directed by your caregiver.  Increasing usage of your inhaler if you have asthma. Zinc gel and zinc lozenges, taken in the first 24 hours of the common cold, can shorten the duration and lessen the severity of symptoms. Pain medicines may help with fever, muscle aches, and throat pain. A variety of non-prescription medicines are available to treat congestion and runny nose. Your caregiver   can make recommendations and may suggest nasal or lung inhalers for other symptoms.  HOME CARE INSTRUCTIONS   Only take over-the-counter or prescription medicines for pain, discomfort, or fever as directed by your  caregiver.  Use a warm mist humidifier or inhale steam from a shower to increase air moisture. This may keep secretions moist and make it easier to breathe.  Drink enough water and fluids to keep your urine clear or pale yellow.  Rest as needed.  Return to work when your temperature has returned to normal or as your caregiver advises. You may need to stay home longer to avoid infecting others. You can also use a face mask and careful hand washing to prevent spread of the virus. SEEK MEDICAL CARE IF:   After the first few days, you feel you are getting worse rather than better.  You need your caregiver's advice about medicines to control symptoms.  You develop chills, worsening shortness of breath, or brown or red sputum. These may be signs of pneumonia.  You develop yellow or brown nasal discharge or pain in the face, especially when you bend forward. These may be signs of sinusitis.  You develop a fever, swollen neck glands, pain with swallowing, or white areas in the back of your throat. These may be signs of strep throat. SEEK IMMEDIATE MEDICAL CARE IF:   You have a fever.  You develop severe or persistent headache, ear pain, sinus pain, or chest pain.  You develop wheezing, a prolonged cough, cough up blood, or have a change in your usual mucus (if you have chronic lung disease).  You develop sore muscles or a stiff neck. Document Released: 09/14/2000 Document Revised: 06/13/2011 Document Reviewed: 06/26/2013 [employer] Patient Information 2015 [employer]. This information is not intended to replace advice given to you by your health care provider. Make sure you discuss any questions you have with your health care provider.  

## 2014-08-06 NOTE — ED Provider Notes (Signed)
CSN: [id number]     Arrival date & time 08/06/14  1705 History   First MD Initiated Contact with Patient 08/06/14 1733     Chief Complaint  Patient presents with  . Sinusitis   (Consider location/radiation/quality/duration/timing/severity/associated sxs/prior Treatment) HPI Comments: Patient presents for evaluation of 2 days of nasal congestion, rhinorrhea, post nasal drainage and scratchy throat.  No fevers Nonsmoker Treating at home with [place] and nasal saline PCP: Randel Pigg  The history is provided by the patient.    Past Medical History  Diagnosis Date  . Other and unspecified hyperlipidemia   . Depressive disorder, not elsewhere classified   . Osteoarthritis   . Sleep apnea      On CPAP  . History of fractured kneecap     right   . Fibroid   . Chicken pox 69 yrs old  . Measles as a child  . Allergic state 02/01/2010    Qualifier: Diagnosis of  By: Regis Bill MD, Standley Brooking   . Overweight(278.02) 10/05/2011  . Allergy   . Thyroid disease     h/o hyperthyroid treated with radioactive iodine? from 16 to 20  . Hyperglycemia   . Back pain   . OA (osteoarthritis) of knee     left knee  . Varicose vein of leg   . Sleep apnea   . Knee pain, left 10/05/2011    Follows with Dr Margarette Canada  . Anxiety   . Stye 11/19/2012  . Obesity 10/05/2011  . Diabetes   . Osteopenia 06/26/2014  . Overweight 10/05/2011  . Type 2 diabetes mellitus with hyperlipidemia   . Medicare annual wellness visit, subsequent 07/06/2014   Past Surgical History  Procedure Laterality Date  . Back surgery      L5 discectomy, 2002. helpful  . Cpap    . Cesarean section  1987  . Knee surgery Right 2006    R knee fx. patella  . Leep     Family History  Problem Relation Age of Onset  . Hyperlipidemia Mother   . Dementia Mother   . Hyperlipidemia Father   . Heart disease Father     bypass, CAD  . Hyperlipidemia Sister   . Diabetes Sister     type 2  . Dementia Maternal Grandmother     alzheimer  . Alcohol  abuse Maternal Grandfather   . Diabetes Paternal Grandmother     type 2?   History  Substance Use Topics  . Smoking status: Never Smoker   . Smokeless tobacco: Never Used  . Alcohol Use: 0.6 - 1.2 oz/week    1-2 Glasses of wine per week   OB History    Gravida Para Term Preterm AB TAB SAB Ectopic Multiple Living   2 1 1  1  1   1      Review of Systems  All other systems reviewed and are negative.   Allergies  Codeine; Crestor; Amoxicillin; Erythromycin; and Sulfonamide derivatives  Home Medications   Prior to Admission medications   Medication Sig Start Date End Date Taking? Authorizing Provider  Ascorbic Acid (VITAMIN C PO) Take 1 tablet by mouth daily. 500 mg    Historical Provider, MD  Cholecalciferol (VITAMIN D PO) Take 1 tablet by mouth daily. 1000 units    Historical Provider, MD  cycloSPORINE (RESTASIS) 0.05 % ophthalmic emulsion 1 drop daily.     Historical Provider, MD  DULoxetine (CYMBALTA) 30 MG capsule TAKE ONE CAPSULE BY MOUTH ONE TIME DAILY  12/16/13  Mosie Lukes, MD  ipratropium (ATROVENT) 0.06 % nasal spray Place 2 sprays into both nostrils 4 (four) times daily. For nasal congestion and post nasal drainage 08/06/14   Lutricia Feil, PA  Krill Oil CAPS Take 1 capsule by mouth daily. megared krill oil caps daily 06/19/12   Mosie Lukes, MD  pravastatin (PRAVACHOL) 80 MG tablet Take 1 tablet (80 mg total) by mouth daily. 11/01/13   Mosie Lukes, MD   BP 142/65 mmHg  Pulse 53  Temp(Src) 98.3 F (36.8 C) (Oral)  Resp 18  SpO2 100%  LMP 04/04/2000 Physical Exam  Constitutional: She is oriented to person, place, and time. She appears well-developed and well-nourished.  HENT:  Head: Normocephalic and atraumatic.  Right Ear: Hearing, tympanic membrane, external ear and ear canal normal.  Left Ear: Hearing, tympanic membrane, external ear and ear canal normal.  Nose: Mucosal edema present.  Mouth/Throat: Uvula is midline, oropharynx is clear and moist  and mucous membranes are normal.  Eyes: Conjunctivae are normal.  Neck: Normal range of motion. Neck supple.  Cardiovascular: Normal rate, regular rhythm and normal heart sounds.   Pulmonary/Chest: Effort normal and breath sounds normal. No respiratory distress. She has no wheezes.  Musculoskeletal: Normal range of motion.  Lymphadenopathy:    She has no cervical adenopathy.  Neurological: She is alert and oriented to person, place, and time.  Skin: Skin is warm and dry.  Psychiatric: She has a normal mood and affect. Her behavior is normal.  Nursing note and vitals reviewed.   ED Course  Procedures (including critical care time) Labs Review Labs Reviewed - No data to display  Imaging Review No results found.   MDM   1. Common cold   2. Nasal congestion    Common cold Atrovent nasal spray May continue nasal saline PCP follow up if no improvement over the next 3-5 days   Lutricia Feil, PA 08/06/14 [phone]

## 2014-10-02 ENCOUNTER — Ambulatory Visit: Payer: PRIVATE HEALTH INSURANCE | Admitting: Certified Nurse Midwife

## 2014-10-02 ENCOUNTER — Encounter: Payer: Self-pay | Admitting: Certified Nurse Midwife

## 2014-10-27 ENCOUNTER — Telehealth: Payer: Self-pay | Admitting: Internal Medicine

## 2014-10-27 DIAGNOSIS — G4733 Obstructive sleep apnea (adult) (pediatric): Secondary | ICD-10-CM

## 2014-10-27 NOTE — Telephone Encounter (Signed)
Pt cb, requesting order be faxed to [place], [phone]

## 2014-10-27 NOTE — Telephone Encounter (Signed)
Pt states she needs a CPAP hose  Pt needs Rx sent to [facility] for new hose.  Needs this done ASAP as she is leaving to go out of town on Wednesday 7/27 Upcoming appt with CY 11/27/14 Order placed for replacement part.  Nothing further needed.

## 2014-10-31 ENCOUNTER — Other Ambulatory Visit: Payer: Self-pay | Admitting: Family Medicine

## 2014-10-31 MED ORDER — DULOXETINE HCL 30 MG PO CPEP: 30.0000 mg | ORAL_CAPSULE | Freq: Every day | ORAL | Status: DC

## 2014-10-31 NOTE — Telephone Encounter (Signed)
Caller name: Sharissa Brierley Relationship to patient: self Can be reached: [phone] cell # Pharmacy: [facility] in [place]!!! Pt is out of town. Ph# [phone]  Reason for call: Pt needing refill on Cymbalta. She is out. Takes 1/day. Pt is [place] of [place]. See ph # for [facility] above in [place].

## 2014-10-31 NOTE — Telephone Encounter (Signed)
Rx sent 

## 2014-11-11 ENCOUNTER — Telehealth: Payer: Self-pay | Admitting: Family Medicine

## 2014-11-11 DIAGNOSIS — E1169 Type 2 diabetes mellitus with other specified complication: Secondary | ICD-10-CM

## 2014-11-11 DIAGNOSIS — IMO0001 Reserved for inherently not codable concepts without codable children: Secondary | ICD-10-CM

## 2014-11-11 DIAGNOSIS — M8580 Other specified disorders of bone density and structure, unspecified site: Secondary | ICD-10-CM

## 2014-11-11 DIAGNOSIS — R030 Elevated blood-pressure reading, without diagnosis of hypertension: Principal | ICD-10-CM

## 2014-11-11 DIAGNOSIS — E785 Hyperlipidemia, unspecified: Secondary | ICD-10-CM

## 2014-11-11 NOTE — Telephone Encounter (Signed)
Ok to run previsit labs at [facility] please order. Vitamin D for osteopenia. Lipid for hyperlipidemia. HGBA1C for DM 2 and cbc, tsh and cmp for elevated BP

## 2014-11-11 NOTE — Telephone Encounter (Signed)
Relation to pt: self  Call back [phone]   Reason for call:  Patient scheduled pre visit lab appointment for 01/02/2015 and would like to have labs done in . Please advise

## 2014-11-11 NOTE — Telephone Encounter (Signed)
Patient notified and put order in at the [facility].

## 2014-11-27 ENCOUNTER — Ambulatory Visit (INDEPENDENT_AMBULATORY_CARE_PROVIDER_SITE_OTHER): Payer: Medicare HMO | Admitting: Internal Medicine

## 2014-11-27 ENCOUNTER — Encounter: Payer: Self-pay | Admitting: Internal Medicine

## 2014-11-27 VITALS — BP 132/76 | HR 68 | Ht 64.5 in | Wt 162.0 lb

## 2014-11-27 DIAGNOSIS — G4733 Obstructive sleep apnea (adult) (pediatric): Secondary | ICD-10-CM | POA: Diagnosis not present

## 2014-11-27 NOTE — Progress Notes (Signed)
Chief Complaint  Patient presents with  . Sleep Apnea    Currently using CPAP every night for 7 hours per night. Wants to discuss possibly getting an oral appliance.    History of Present Illness: Cindy [name] is a 69 y.o. female with OSA.  She is here to determine whether she is a candidate for an oral appliance.  She received a flyer in the mail detailing how she could get an oral appliance for her sleep apnea.  She has been doing well with CPAP.  Her concern is related to bringing her CPAP with her when she travels.  Her machine was broken once by [employer].  It is also a nuisance needing to get distilled water when she travels, since she can't bring liquids on a plane flight.  TESTS: [facility] 10/21/05 >> AHI 58   09/25/12- Dr Halford Chessman She  has a past medical history of Other and unspecified hyperlipidemia; Depressive disorder, not elsewhere classified; Osteoarthritis; Sleep apnea; History of fractured kneecap; Fibroid; Chicken pox (69 yrs old); Measles (as a child); Allergic state (02/01/2010); Overweight(278.02) (10/05/2011); Allergy; Thyroid disease; Hyperglycemia; Back pain; OA (osteoarthritis) of knee; Varicose vein of leg; Sleep apnea; and Knee pain, left (10/05/2011). She  has past surgical history that includes Back surgery; CPAP; Cesarean section (1987); and Knee surgery (Right, 2006).  10/31/13- 68 yoF never smoker COMPLAINS OF: Former VS pt- Wearing CPAP 7-8 hours per night. Pt needs all new supplies-DME is [facility] She asks reassessment of sleep apnea. Using CPAP/[facility], nasal mask and humidifier. Sleeps comfortably. Bedtime between 10:30 and midnight. Sleep latency 5 minutes, wakes maybe once before up at 6:30 AM. May go back to sleep until 8 AM. Drowsy at 3 PM.  No ENT surgery or lung disease. Marital separation, living alone.  11/27/14- 68 yoF never smoker followed for OSA-  FOLLOWS FOR: Pt states she is wearing CPAP 10/[facility] nightly for about 7 hours. Pt states she has to adjust her nasal  pillows frequently to decrease leaking. Pt denies issues with the pressure or machine. Compliance download shows she is meeting usage goals.  Lives alone but told she does not snore. She feels she is sleeping well.  ROS-see HPI Constitutional:   No-   weight loss, night sweats, fevers, chills, fatigue, lassitude. HEENT:   No-  headaches, difficulty swallowing, tooth/dental problems, sore throat,       No-  sneezing, itching, ear ache, nasal congestion, post nasal drip,  CV:  No-   chest pain, orthopnea, PND, swelling in lower extremities, anasarca,                                                           dizziness, palpitations Resp: No-   shortness of breath with exertion or at rest.              No-   productive cough,  No non-productive cough,  No- coughing up of blood.              No-   change in color of mucus.  No- wheezing.   Skin: No-   rash or lesions. GI:  No-   heartburn, indigestion, abdominal pain, nausea, vomiting, GU:  MS:  No-   joint pain or swelling.  . Neuro-     nothing unusual Psych:  No- change in mood or affect. No depression or anxiety.  No memory loss.  OBJ- Physical Exam General- Alert, Oriented, Affect-appropriate, Distress- none acute Skin- rash-none, lesions- none, excoriation- none Lymphadenopathy- none Head- atraumatic            Eyes- Gross vision intact, PERRLA, conjunctivae and secretions clear            Ears- Hearing, canals-normal            Nose- Clear, no-Septal dev, mucus, polyps, erosion, perforation             Throat- Mallampati III , mucosa clear , drainage- none, tonsils- atrophic Neck- flexible , trachea midline, no stridor , thyroid nl, carotid no bruit Chest - symmetrical excursion , unlabored           Heart/CV- RRR , no murmur , no gallop  , no rub, nl s1 s2                           - JVD- none , edema- none, stasis changes- none, varices- none           Lung- clear to P&A, wheeze- none, cough- none , dullness-none, rub- none            Chest wall-  Abd-  Br/ Gen/ Rectal- Not done, not indicated Extrem- cyanosis- none, clubbing, none, atrophy- none, strength- nl Neuro- grossly intact to observation

## 2014-11-27 NOTE — Patient Instructions (Signed)
Order- DME   [facility]  Download CPAP for pressure compliance, Evaluate for replacement for old machine, continue current settings, mask of choice, humidifier, supplies,                     Dx OSA

## 2014-11-30 NOTE — Assessment & Plan Note (Signed)
We need to update her orders covering replacement mask and supplies. Also asking [facility] for full CPAP download for pressure and compliance

## 2014-12-01 ENCOUNTER — Telehealth: Payer: Self-pay | Admitting: Family Medicine

## 2014-12-01 MED ORDER — DULOXETINE HCL 30 MG PO CPEP: 30.0000 mg | ORAL_CAPSULE | Freq: Every day | ORAL | Status: DC

## 2014-12-01 NOTE — Telephone Encounter (Signed)
Pt needing refill on Cymbalta. Pt is out of medication now. Please send to [place] at [facility].

## 2014-12-01 NOTE — Telephone Encounter (Signed)
Refill done as requested 

## 2014-12-04 ENCOUNTER — Encounter: Payer: Self-pay | Admitting: Sports Medicine

## 2014-12-04 ENCOUNTER — Ambulatory Visit (INDEPENDENT_AMBULATORY_CARE_PROVIDER_SITE_OTHER): Payer: Medicare HMO | Admitting: Sports Medicine

## 2014-12-04 VITALS — BP 143/62 | Ht 66.0 in | Wt 158.0 lb

## 2014-12-04 DIAGNOSIS — G5601 Carpal tunnel syndrome, right upper limb: Secondary | ICD-10-CM | POA: Diagnosis not present

## 2014-12-04 DIAGNOSIS — G5600 Carpal tunnel syndrome, unspecified upper limb: Secondary | ICD-10-CM | POA: Insufficient documentation

## 2014-12-04 NOTE — Progress Notes (Signed)
  Cindy [name] - 69 y.o. female MRN [id number]  Date of birth: [birth year] Cindy [name] is a 69 y.o. female who presents today for right wrist and thumb pain.  Right wrist pain, initial visit-patient's being evaluated today for right wrist pain located on the palmar aspect that goes some what into the thumb. This is been ongoing for several months and denies a specific injury. This has slightly resolved over the past 3-4 weeks at this time after doing activity modification and splinting. 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 She has used a wrist pad with computer activity and is now wearing the brace at night. Cramping described in the carpal tunnel that radiates into the thenar eminence and proximally into the deep forearm. She denies any frank paresthesias or dysesthesias going into her hand and no weakness with grip strength. She has never had this before and she is right hand dominant.  PMHx - Updated and reviewed.  Contributory factors include: hypothyroidism 2/2 hyperthyroidism tx radioactive iodine.  DM II PSHx - Updated and reviewed.  Contributory factors include:  Non contributory  FHx - Updated and reviewed.  Contributory factors include:  Non contributory  Medications - Pravachol    ROS Per HPI, 12 point negative otherwise   Exam:  Filed Vitals:   12/04/14 1131  BP: 143/62   Gen: NAD Cardiorespiratory - Normal respiratory effort/rate.  RRR Wrist: Inspection normal with no visible erythema or swelling or atrophy. ROM smooth and normal with good flexion and extension and ulnar/radial deviation that is symmetrical with opposite wrist. Palpation is normal over metacarpals, navicular, lunate, and TFCC; tendons without tenderness/ swelling Strength 5/5 in all directions without pain. Negative Finkelstein, + tinel's and phalens and flick sign.   Imaging:  [name] of median nerve in short axis showing cross sectional area of 0.124 cm at  the transverse carpal ligament

## 2014-12-04 NOTE — Assessment & Plan Note (Addendum)
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  Does have cross sectional area of 0.124 cm on R wrist of median nerve - Continue with splint and activity modification - Consider hydrodissection of MN in future if no improvement at next visit - Discussed long term tx solving her issue of CTS release which she is not anywhere close to today.  Consider hand ergometer testing and possibly EMG in future if worsening as well. - F/U in 6-8 weeks if reoccurrence or worsening of Sx.  Otherwise PRN

## 2014-12-15 ENCOUNTER — Other Ambulatory Visit: Payer: Self-pay | Admitting: Family Medicine

## 2014-12-23 ENCOUNTER — Telehealth: Payer: Self-pay | Admitting: Family Medicine

## 2014-12-23 DIAGNOSIS — Z0000 Encounter for general adult medical examination without abnormal findings: Secondary | ICD-10-CM

## 2014-12-23 NOTE — Telephone Encounter (Signed)
OK to add free T4 and free T3 to Tsh if she wants anything else specific let me know.

## 2014-12-23 NOTE — Telephone Encounter (Signed)
Pt going to [facility] 01/02/15 and states she would like more in depth labs orders for thyroid. Please f/u with pt [phone].

## 2014-12-24 NOTE — Telephone Encounter (Signed)
Pt just wanted a further test on thyroid no other tests at this time needed.

## 2014-12-26 ENCOUNTER — Ambulatory Visit: Payer: PRIVATE HEALTH INSURANCE | Admitting: Certified Nurse Midwife

## 2014-12-26 ENCOUNTER — Encounter: Payer: Self-pay | Admitting: Certified Nurse Midwife

## 2014-12-26 ENCOUNTER — Ambulatory Visit (INDEPENDENT_AMBULATORY_CARE_PROVIDER_SITE_OTHER): Payer: Medicare HMO | Admitting: Certified Nurse Midwife

## 2014-12-26 ENCOUNTER — Other Ambulatory Visit: Payer: Medicare HMO

## 2014-12-26 VITALS — BP 102/60 | HR 68 | Resp 16 | Ht 65.0 in | Wt 153.0 lb

## 2014-12-26 DIAGNOSIS — Z124 Encounter for screening for malignant neoplasm of cervix: Secondary | ICD-10-CM | POA: Diagnosis not present

## 2014-12-26 DIAGNOSIS — N952 Postmenopausal atrophic vaginitis: Secondary | ICD-10-CM

## 2014-12-26 DIAGNOSIS — Z0000 Encounter for general adult medical examination without abnormal findings: Secondary | ICD-10-CM

## 2014-12-26 DIAGNOSIS — Z01419 Encounter for gynecological examination (general) (routine) without abnormal findings: Secondary | ICD-10-CM

## 2014-12-26 LAB — POCT URINALYSIS DIPSTICK
Bilirubin, UA: NEGATIVE
Blood, UA: NEGATIVE
Glucose, UA: NEGATIVE
KETONES UA: NEGATIVE
LEUKOCYTES UA: NEGATIVE
Nitrite, UA: NEGATIVE
Protein, UA: NEGATIVE
Urobilinogen, UA: NEGATIVE
pH, UA: 5

## 2014-12-26 NOTE — Progress Notes (Signed)
69 y.o. G90P1011 Divorced Caucasian Fe here for annual exam. Happy divorce is over, emotionally much better.Menopausal, no HRT. Denies vaginal bleeding. Some vaginal dryness. Sees PCP for cholesterol/anxiety management/type 2 diabetes/labs and aex. Patient has been on weight loss and exercise.Has lost over 50 pounds! Occasional incontinence( old problem) if waits to long. Has appointment with PCP for labs in one week. Had BMD with PCP and has osteopenia, working on exercise and dietary amounts of calcium. No other health issues today.  Patient's last menstrual period was 04/04/2000.          Sexually active: No.  The current method of family planning is post menopausal status.    Exercising: Yes.    curves,walk,swim Smoker:  no  Health Maintenance: Pap:  09-30-13 neg  History of LEEP 2006 MMG: 01-28-14 category b density,birads 1:neg Colonoscopy:  2007 with pcp BMD:   2016  Osteopenia TDaP:  2014 Labs: poct urine-neg Self breast exam: done occ   reports that she has never smoked. She has never used smokeless tobacco. She reports that she drinks about 0.6 oz of alcohol per week. She reports that she does not use illicit drugs.  Past Medical History  Diagnosis Date  . Other and unspecified hyperlipidemia   . Depressive disorder, not elsewhere classified   . Osteoarthritis   . Sleep apnea      On CPAP  . History of fractured kneecap     right   . Fibroid   . Chicken pox 69 yrs old  . Measles as a child  . Allergic state 02/01/2010    Qualifier: Diagnosis of  By: Regis Bill MD, Standley Brooking   . Overweight(278.02) 10/05/2011  . Allergy   . Thyroid disease     h/o hyperthyroid treated with radioactive iodine? from 16 to 20  . Hyperglycemia   . Back pain   . OA (osteoarthritis) of knee     left knee  . Varicose vein of leg   . Sleep apnea   . Knee pain, left 10/05/2011    Follows with Dr Margarette Canada  . Anxiety   . Stye 11/19/2012  . Obesity 10/05/2011  . Diabetes   . Osteopenia 06/26/2014  .  Overweight 10/05/2011  . Type 2 diabetes mellitus with hyperlipidemia   . Medicare annual wellness visit, subsequent 07/06/2014    Past Surgical History  Procedure Laterality Date  . Back surgery      L5 discectomy, 2002. helpful  . Cpap    . Cesarean section  1987  . Knee surgery Right 2006    R knee fx. patella  . Leep      Current Outpatient Prescriptions  Medication Sig Dispense Refill  . B Complex Vitamins (VITAMIN B COMPLEX PO) Take by mouth daily.    . cycloSPORINE (RESTASIS) 0.05 % ophthalmic emulsion 1 drop daily.     . DULoxetine (CYMBALTA) 30 MG capsule Take 1 capsule (30 mg total) by mouth daily. 30 capsule 3  . ipratropium (ATROVENT) 0.06 % nasal spray Place 2 sprays into both nostrils 4 (four) times daily. For nasal congestion and post nasal drainage 15 mL 12  . levocetirizine (XYZAL) 5 MG tablet as needed.  3  . pravastatin (PRAVACHOL) 80 MG tablet TAKE ONE BY MOUTH ONCE DAILY 90 tablet 1   No current facility-administered medications for this visit.    Family History  Problem Relation Age of Onset  . Hyperlipidemia Mother   . Dementia Mother   . Hyperlipidemia Father   .  Heart disease Father     bypass, CAD  . Hyperlipidemia Sister   . Diabetes Sister     type 2  . Dementia Maternal Grandmother     alzheimer  . Alcohol abuse Maternal Grandfather   . Diabetes Paternal Grandmother     type 2?    ROS:  Pertinent items are noted in HPI.  Otherwise, a comprehensive ROS was negative.  Exam:   BP 102/60 mmHg  Pulse 68  Resp 16  Ht 5\' 5"  (1.651 m)  Wt 153 lb (69.4 kg)  BMI 25.46 kg/m2  LMP 04/04/2000 Height: 5\' 5"  (165.1 cm) Ht Readings from Last 3 Encounters:  12/26/14 5\' 5"  (1.651 m)  12/04/14 5\' 6"  (1.676 m)  11/27/14 5' 4.5" (1.638 m)    General appearance: alert, cooperative and appears stated age Head: Normocephalic, without obvious abnormality, atraumatic Neck: no adenopathy, supple, symmetrical, trachea midline and thyroid normal to  inspection and palpation Lungs: clear to auscultation bilaterally Breasts: normal appearance, no masses or tenderness, No nipple retraction or dimpling, No nipple discharge or bleeding Heart: regular rate and rhythm Abdomen: soft, non-tender; no masses,  no organomegaly Extremities: extremities normal, atraumatic, no cyanosis or edema Skin: Skin color, texture, turgor normal. No rashes or lesions Lymph nodes: Cervical, supraclavicular, and axillary nodes normal. No abnormal inguinal nodes palpated Neurologic: Grossly normal   Pelvic: External genitalia:  no lesions              Urethra:  normal appearing urethra with no masses, tenderness or lesions              Bartholin's and Skene's: normal                 Vagina: atrophic appearing vagina with normal color and scant moisture, no lesions              Cervix: normal, non tender, no lesions              Pap taken: Yes.   Bimanual Exam:  Uterus:  normal size, contour, position, consistency, mobility, non-tender              Adnexa: normal adnexa and no mass, fullness, tenderness               Rectovaginal: Confirms               Anus:  normal sphincter tone, no lesions  Chaperone present: yes  A:  Well Woman with normal exam  Menopausal no HRT  Atrophic vaginitis  Cholesterol/type 2 diabetes/anxiety with PCP management. Previous history of hyperthyroid with RI,no issues now  P:   Reviewed health and wellness pertinent to exam  Aware of need to evaluate if vaginal bleeding  Discussed finding and need for to increase moisture, to decrease urinary frequency and risk of UTI . Discussed estrogen and OTC options. Patient prefers OTC coconut trial. Instructions given, will advise if problems or no change.  Continue follow up with PCP and congratulated on weight loss.  Pap smear as above with HPV reflex  counseled on breast self exam, mammography screening, adequate intake of calcium and vitamin D, diet and exercise  return annually or  prn  An After Visit Summary was printed and given to the patient.

## 2014-12-26 NOTE — Progress Notes (Signed)
Reviewed personally.  M. Suzanne Miller, MD.  

## 2014-12-26 NOTE — Patient Instructions (Signed)

## 2014-12-29 ENCOUNTER — Other Ambulatory Visit: Payer: Self-pay

## 2014-12-29 DIAGNOSIS — Z1231 Encounter for screening mammogram for malignant neoplasm of breast: Secondary | ICD-10-CM

## 2014-12-30 LAB — IPS PAP TEST WITH REFLEX TO HPV

## 2015-01-01 ENCOUNTER — Ambulatory Visit: Payer: Medicare HMO | Admitting: Family Medicine

## 2015-01-02 ENCOUNTER — Other Ambulatory Visit: Payer: Medicare HMO

## 2015-01-02 ENCOUNTER — Other Ambulatory Visit (INDEPENDENT_AMBULATORY_CARE_PROVIDER_SITE_OTHER): Payer: Medicare HMO

## 2015-01-02 DIAGNOSIS — M8580 Other specified disorders of bone density and structure, unspecified site: Secondary | ICD-10-CM

## 2015-01-02 DIAGNOSIS — IMO0001 Reserved for inherently not codable concepts without codable children: Secondary | ICD-10-CM

## 2015-01-02 DIAGNOSIS — E785 Hyperlipidemia, unspecified: Secondary | ICD-10-CM | POA: Diagnosis not present

## 2015-01-02 DIAGNOSIS — Z0000 Encounter for general adult medical examination without abnormal findings: Secondary | ICD-10-CM | POA: Diagnosis not present

## 2015-01-02 DIAGNOSIS — E1169 Type 2 diabetes mellitus with other specified complication: Secondary | ICD-10-CM

## 2015-01-02 DIAGNOSIS — R030 Elevated blood-pressure reading, without diagnosis of hypertension: Secondary | ICD-10-CM | POA: Diagnosis not present

## 2015-01-02 LAB — COMPREHENSIVE METABOLIC PANEL
ALK PHOS: 66 U/L (ref 39–117)
ALT: 16 U/L (ref 0–35)
AST: 20 U/L (ref 0–37)
Albumin: 4 g/dL (ref 3.5–5.2)
BUN: 14 mg/dL (ref 6–23)
CHLORIDE: 102 meq/L (ref 96–112)
CO2: 26 mEq/L (ref 19–32)
Calcium: 9.9 mg/dL (ref 8.4–10.5)
Creatinine, Ser: 0.91 mg/dL (ref 0.40–1.20)
GFR: 65.44 mL/min (ref >60.00)
GLUCOSE: 80 mg/dL (ref 70–99)
POTASSIUM: 4.1 meq/L (ref 3.5–5.1)
Sodium: 140 mEq/L (ref 135–145)
TOTAL PROTEIN: 7 g/dL (ref 6.0–8.3)
Total Bilirubin: 0.6 mg/dL (ref 0.2–1.2)

## 2015-01-02 LAB — HEMOGLOBIN A1C: HEMOGLOBIN A1C: 5.6 % (ref 4.6–6.5)

## 2015-01-02 LAB — CBC WITH DIFFERENTIAL/PLATELET
BASOS ABS: 0.1 10*3/uL (ref 0.0–0.1)
BASOS PCT: 1 % (ref 0.0–3.0)
EOS ABS: 0.2 10*3/uL (ref 0.0–0.7)
Eosinophils Relative: 2.2 % (ref 0.0–5.0)
HEMATOCRIT: 42.8 % (ref 36.0–46.0)
HEMOGLOBIN: 14 g/dL (ref 12.0–15.0)
LYMPHS PCT: 30.7 % (ref 12.0–46.0)
Lymphs Abs: 2.2 10*3/uL (ref 0.7–4.0)
MCHC: 32.8 g/dL (ref 30.0–36.0)
MCV: 90.8 fl (ref 78.0–100.0)
MONO ABS: 0.6 10*3/uL (ref 0.1–1.0)
Monocytes Relative: 8.4 % (ref 3.0–12.0)
Neutro Abs: 4 10*3/uL (ref 1.4–7.7)
Neutrophils Relative %: 57.7 % (ref 43.0–77.0)
Platelets: 313 10*3/uL (ref 150.0–400.0)
RBC: 4.72 Mil/uL (ref 3.87–5.11)
RDW: 14.3 % (ref 11.5–15.5)
WBC: 7 10*3/uL (ref 4.0–10.5)

## 2015-01-02 LAB — T4, FREE: Free T4: 1.69 ng/dL — ABNORMAL HIGH (ref 0.60–1.60)

## 2015-01-02 LAB — T3, FREE: T3, Free: 8.9 pg/mL — ABNORMAL HIGH (ref 2.3–4.2)

## 2015-01-02 LAB — LIPID PANEL
CHOL/HDL RATIO: 4
Cholesterol: 182 mg/dL (ref 0–200)
HDL: 43.2 mg/dL (ref >39.00)
LDL Cholesterol: 122 mg/dL — ABNORMAL HIGH (ref 0–99)
NonHDL: 139.29
TRIGLYCERIDES: 87 mg/dL (ref 0.0–149.0)
VLDL: 17.4 mg/dL (ref 0.0–40.0)

## 2015-01-02 LAB — VITAMIN D 25 HYDROXY (VIT D DEFICIENCY, FRACTURES): VITD: 52.71 ng/mL (ref 30.00–100.00)

## 2015-01-02 LAB — TSH: TSH: 1.11 u[IU]/mL (ref 0.35–4.50)

## 2015-01-09 ENCOUNTER — Encounter: Payer: Self-pay | Admitting: Family Medicine

## 2015-01-09 ENCOUNTER — Ambulatory Visit (INDEPENDENT_AMBULATORY_CARE_PROVIDER_SITE_OTHER): Payer: Medicare HMO | Admitting: Family Medicine

## 2015-01-09 VITALS — BP 119/48 | HR 57 | Temp 97.6°F | Ht 66.0 in | Wt 150.0 lb

## 2015-01-09 DIAGNOSIS — E663 Overweight: Secondary | ICD-10-CM

## 2015-01-09 DIAGNOSIS — E0590 Thyrotoxicosis, unspecified without thyrotoxic crisis or storm: Secondary | ICD-10-CM

## 2015-01-09 DIAGNOSIS — E1169 Type 2 diabetes mellitus with other specified complication: Secondary | ICD-10-CM | POA: Diagnosis not present

## 2015-01-09 DIAGNOSIS — Z23 Encounter for immunization: Secondary | ICD-10-CM | POA: Diagnosis not present

## 2015-01-09 DIAGNOSIS — E782 Mixed hyperlipidemia: Secondary | ICD-10-CM | POA: Diagnosis not present

## 2015-01-09 DIAGNOSIS — IMO0001 Reserved for inherently not codable concepts without codable children: Secondary | ICD-10-CM

## 2015-01-09 DIAGNOSIS — E785 Hyperlipidemia, unspecified: Secondary | ICD-10-CM

## 2015-01-09 DIAGNOSIS — R030 Elevated blood-pressure reading, without diagnosis of hypertension: Secondary | ICD-10-CM | POA: Diagnosis not present

## 2015-01-09 DIAGNOSIS — Z0000 Encounter for general adult medical examination without abnormal findings: Secondary | ICD-10-CM

## 2015-01-09 DIAGNOSIS — M899 Disorder of bone, unspecified: Secondary | ICD-10-CM

## 2015-01-09 DIAGNOSIS — M949 Disorder of cartilage, unspecified: Secondary | ICD-10-CM

## 2015-01-09 LAB — MICROALBUMIN / CREATININE URINE RATIO
CREATININE, U: 141.9 mg/dL
MICROALB/CREAT RATIO: 0.5 mg/g (ref 0.0–30.0)

## 2015-01-09 NOTE — Progress Notes (Signed)
Pre visit review using our clinic review tool, if applicable. No additional management support is needed unless otherwise documented below in the visit note. 

## 2015-01-09 NOTE — Patient Instructions (Signed)
Hyperthyroidism Hyperthyroidism is when the thyroid is too active (overactive). Your thyroid is a large gland that is located in your neck. The thyroid helps to control how your body uses food (metabolism). When your thyroid is overactive, it produces too much of a hormone called thyroxine.  CAUSES Causes of hyperthyroidism may include:  Graves disease. This is when your immune system attacks the thyroid gland. This is the most common cause.  Inflammation of the thyroid gland.  Tumor in the thyroid gland or somewhere else.  Excessive use of thyroid medicines, including:  Prescription thyroid supplement.  Herbal supplements that mimic thyroid hormones.  Solid or fluid-filled lumps within your thyroid gland (thyroid nodules).  Excessive ingestion of iodine. RISK FACTORS  Being female.  Having a family history of thyroid conditions. SIGNS AND SYMPTOMS Signs and symptoms of hyperthyroidism may include:  Nervousness.  Inability to tolerate heat.  Unexplained weight loss.  Diarrhea.  Change in the texture of hair or skin.  Heart skipping beats or making extra beats.  Rapid heart rate.  Loss of menstruation.  Shaky hands.  Fatigue.  Restlessness.  Increased appetite.  Sleep problems.  Enlarged thyroid gland or nodules. DIAGNOSIS  Diagnosis of hyperthyroidism may include:  Medical history and physical exam.  Blood tests.  Ultrasound tests. TREATMENT Treatment may include:  Medicines to control your thyroid.  Surgery to remove your thyroid.  Radiation therapy. HOME CARE INSTRUCTIONS   Take medicines only as directed by your health care provider.  Do not use any tobacco products, including cigarettes, chewing tobacco, or electronic cigarettes. If you need help quitting, ask your health care provider.  Do not exercise or do physical activity until your health care provider approves.  Keep all follow-up appointments as directed by your health care  provider. This is important. SEEK MEDICAL CARE IF:  Your symptoms do not get better with treatment.  You have fever.  You are taking thyroid replacement medicine and you:  Have depression.  Feel mentally and physically slow.  Have weight gain. SEEK IMMEDIATE MEDICAL CARE IF:   You have decreased alertness or a change in your awareness.  You have abdominal pain.  You feel dizzy.  You have a rapid heartbeat.  You have an irregular heartbeat.   This information is not intended to replace advice given to you by your health care provider. Make sure you discuss any questions you have with your health care provider.   Document Released: 03/21/2005 Document Revised: 04/11/2014 Document Reviewed: 08/06/2013 Elsevier Interactive Patient Education 2016 [employer]  

## 2015-01-09 NOTE — Progress Notes (Signed)
Patient ID: Cindy [name], female   DOB: [birth year], 69 y.o.   MRN: [id number]   Subjective:    Patient ID: Cindy [name], female    DOB: 16-Oct-1945, 69 y.o.   MRN: [id number]  Chief Complaint  Patient presents with  . Follow-up    HPI Patient is in today for ollow-up. Overall doing well. No recent illness. Denies any acute concerns. No polyuria or polydipsia. Has been minimizing simple carbohydrates. Denies CP/palp/SOB/HA/congestion/fevers/GI or GU c/o. Taking meds as prescribed  Past Medical History  Diagnosis Date  . Other and unspecified hyperlipidemia   . Depressive disorder, not elsewhere classified   . Osteoarthritis   . Sleep apnea      On CPAP  . History of fractured kneecap     right   . Fibroid   . Chicken pox 69 yrs old  . Measles as a child  . Allergic state 02/01/2010    Qualifier: Diagnosis of  By: Regis Bill MD, Standley Brooking   . Overweight(278.02) 10/05/2011  . Allergy   . Thyroid disease     h/o hyperthyroid treated with radioactive iodine? from 16 to 20  . Hyperglycemia   . Back pain   . OA (osteoarthritis) of knee     left knee  . Varicose vein of leg   . Sleep apnea   . Knee pain, left 10/05/2011    Follows with Dr Margarette Canada  . Anxiety   . Stye 11/19/2012  . Obesity 10/05/2011  . Diabetes ([place])   . Osteopenia 06/26/2014  . Overweight 10/05/2011  . Type 2 diabetes mellitus with hyperlipidemia ([place])   . Medicare annual wellness visit, subsequent 07/06/2014    Past Surgical History  Procedure Laterality Date  . Back surgery      L5 discectomy, 2002. helpful  . Cpap    . Cesarean section  1987  . Knee surgery Right 2006    R knee fx. patella  . Leep      Family History  Problem Relation Age of Onset  . Hyperlipidemia Mother   . Dementia Mother   . Hyperlipidemia Father   . Heart disease Father     bypass, CAD  . Hyperlipidemia Sister   . Diabetes Sister     type 2  . Dementia Maternal Grandmother     alzheimer  . Alcohol abuse Maternal Grandfather   .  Diabetes Paternal Grandmother     type 2?    Social History   Social History  . Marital Status: Divorced    Spouse Name: N/A  . Number of Children: N/A  . Years of Education: N/A   Occupational History  . Not on file.   Social History Main Topics  . Smoking status: Never Smoker   . Smokeless tobacco: Never Used  . Alcohol Use: 0.6 oz/week    1 Glasses of wine per week  . Drug Use: No  . Sexual Activity: No   Other Topics Concern  . Not on file   Social History Narrative   Recently divorced separated fall 2012    Now living with friend in [place] area .   Never smoker   Regular exercise   Sleep 01/12/29 up at 6; job hours irreg              Outpatient Prescriptions Prior to Visit  Medication Sig Dispense Refill  . B Complex Vitamins (VITAMIN B COMPLEX PO) Take by mouth daily.    . cycloSPORINE (RESTASIS) 0.05 %  ophthalmic emulsion 1 drop daily.     . DULoxetine (CYMBALTA) 30 MG capsule Take 1 capsule (30 mg total) by mouth daily. 30 capsule 3  . ipratropium (ATROVENT) 0.06 % nasal spray Place 2 sprays into both nostrils 4 (four) times daily. For nasal congestion and post nasal drainage 15 mL 12  . levocetirizine (XYZAL) 5 MG tablet as needed.  3  . pravastatin (PRAVACHOL) 80 MG tablet TAKE ONE BY MOUTH ONCE DAILY 90 tablet 1   No facility-administered medications prior to visit.    Allergies  Allergen Reactions  . Codeine Other (See Comments)    hallucinations  . Crestor [Rosuvastatin]     REACTION: myalgias  . Amoxicillin Rash  . Erythromycin Other (See Comments)    cramps  . Sulfonamide Derivatives Rash    Review of Systems  Constitutional: Negative for fever and malaise/fatigue.  HENT: Negative for congestion.   Eyes: Negative for discharge.  Respiratory: Negative for shortness of breath.   Cardiovascular: Negative for chest pain, palpitations and leg swelling.  Gastrointestinal: Negative for nausea and abdominal pain.  Genitourinary: Negative  for dysuria.  Musculoskeletal: Negative for falls.  Skin: Negative for rash.  Neurological: Negative for loss of consciousness and headaches.  Endo/Heme/Allergies: Negative for environmental allergies.  Psychiatric/Behavioral: Negative for depression. The patient is not nervous/anxious.        Objective:    Physical Exam  Constitutional: She is oriented to person, place, and time. She appears well-developed and well-nourished. No distress.  HENT:  Head: Normocephalic and atraumatic.  Nose: Nose normal.  Eyes: Right eye exhibits no discharge. Left eye exhibits no discharge.  Neck: Normal range of motion. Neck supple.  Cardiovascular: Normal rate and regular rhythm.   No murmur heard. Pulmonary/Chest: Effort normal and breath sounds normal.  Abdominal: Soft. Bowel sounds are normal. There is no tenderness.  Musculoskeletal: She exhibits no edema.  Neurological: She is alert and oriented to person, place, and time.  Skin: Skin is warm and dry.  Psychiatric: She has a normal mood and affect.  Nursing note and vitals reviewed.   BP 119/48 mmHg  Pulse 57  Temp(Src) 97.6 F (36.4 C) (Oral)  Ht 5\' 6"  (1.676 m)  Wt 150 lb (68.04 kg)  BMI 24.22 kg/m2  SpO2 99%  LMP 04/04/2000 Wt Readings from Last 3 Encounters:  01/09/15 150 lb (68.04 kg)  12/26/14 153 lb (69.4 kg)  12/04/14 158 lb (71.668 kg)     Lab Results  Component Value Date   WBC 7.0 01/02/2015   HGB 14.0 01/02/2015   HCT 42.8 01/02/2015   PLT 313.0 01/02/2015   GLUCOSE 80 01/02/2015   CHOL 182 01/02/2015   TRIG 87.0 01/02/2015   HDL 43.20 01/02/2015   LDLDIRECT 145.4 11/14/2012   LDLCALC 122* 01/02/2015   ALT 16 01/02/2015   AST 20 01/02/2015   NA 140 01/02/2015   K 4.1 01/02/2015   CL 102 01/02/2015   CREATININE 0.91 01/02/2015   BUN 14 01/02/2015   CO2 26 01/02/2015   TSH 1.11 01/02/2015   HGBA1C 5.6 01/02/2015   MICROALBUR 0.50 11/10/2011    Lab Results  Component Value Date   TSH 1.11  01/02/2015   Lab Results  Component Value Date   WBC 7.0 01/02/2015   HGB 14.0 01/02/2015   HCT 42.8 01/02/2015   MCV 90.8 01/02/2015   PLT 313.0 01/02/2015   Lab Results  Component Value Date   NA 140 01/02/2015   K 4.1 01/02/2015  CO2 26 01/02/2015   GLUCOSE 80 01/02/2015   BUN 14 01/02/2015   CREATININE 0.91 01/02/2015   BILITOT 0.6 01/02/2015   ALKPHOS 66 01/02/2015   AST 20 01/02/2015   ALT 16 01/02/2015   PROT 7.0 01/02/2015   ALBUMIN 4.0 01/02/2015   CALCIUM 9.9 01/02/2015   GFR 65.44 01/02/2015   Lab Results  Component Value Date   CHOL 182 01/02/2015   Lab Results  Component Value Date   HDL 43.20 01/02/2015   Lab Results  Component Value Date   LDLCALC 122* 01/02/2015   Lab Results  Component Value Date   TRIG 87.0 01/02/2015   Lab Results  Component Value Date   CHOLHDL 4 01/02/2015   Lab Results  Component Value Date   HGBA1C 5.6 01/02/2015       Assessment & Plan:   Problem List Items Addressed This Visit    None    Visit Diagnoses    Hyperthyroidism    -  Primary    Relevant Orders    Ambulatory referral to Endocrinology       I am having Ms. Northway maintain her cycloSPORINE, ipratropium, DULoxetine, pravastatin, levocetirizine, and B Complex Vitamins (VITAMIN B COMPLEX PO).  No orders of the defined types were placed in this encounter.     Elizabeth Sauer, LPN

## 2015-01-16 DIAGNOSIS — M9901 Segmental and somatic dysfunction of cervical region: Secondary | ICD-10-CM | POA: Diagnosis not present

## 2015-01-16 DIAGNOSIS — M9903 Segmental and somatic dysfunction of lumbar region: Secondary | ICD-10-CM | POA: Diagnosis not present

## 2015-01-17 NOTE — Assessment & Plan Note (Signed)
hgba1c acceptable, minimize simple carbs. Increase exercise as tolerated. Continue current meds 

## 2015-01-17 NOTE — Assessment & Plan Note (Signed)
Tolerating statin, encouraged heart healthy diet, avoid trans fats, minimize simple carbs and saturated fats. Increase exercise as tolerated 

## 2015-01-17 NOTE — Assessment & Plan Note (Signed)
Encouraged calcium intake and vitamin D suppplements, stay active.

## 2015-01-17 NOTE — Assessment & Plan Note (Signed)
Encouraged DASH diet, decrease po intake and increase exercise as tolerated. Needs 7-8 hours of sleep nightly. Avoid trans fats, eat small, frequent meals every 4-5 hours with lean proteins, complex carbs and healthy fats. Minimize simple carbs, GMO foods. 

## 2015-02-05 ENCOUNTER — Encounter: Payer: Self-pay | Admitting: Endocrinology

## 2015-02-05 ENCOUNTER — Ambulatory Visit
Admission: RE | Admit: 2015-02-05 | Discharge: 2015-02-05 | Disposition: A | Discharge status: Home / Self Care | Payer: Medicare HMO | Source: Ambulatory Visit

## 2015-02-05 ENCOUNTER — Ambulatory Visit (INDEPENDENT_AMBULATORY_CARE_PROVIDER_SITE_OTHER): Payer: Medicare HMO | Admitting: Endocrinology

## 2015-02-05 VITALS — BP 122/54 | HR 63 | Temp 98.5°F | Resp 14 | Ht 66.0 in | Wt 149.2 lb

## 2015-02-05 DIAGNOSIS — Z1231 Encounter for screening mammogram for malignant neoplasm of breast: Secondary | ICD-10-CM | POA: Diagnosis not present

## 2015-02-05 DIAGNOSIS — R946 Abnormal results of thyroid function studies: Secondary | ICD-10-CM | POA: Diagnosis not present

## 2015-02-05 NOTE — Progress Notes (Signed)
Patient ID: Cindy [name], female   DOB: 02-23-1946, 69 y.o.   MRN: [id number]                                                                                                               Reason for Appointment: ?  Hyperthyroidism, new consultation    History of Present Illness:   Patient has a history of hyperthyroidism at her age of 68 years and was treated medications for about 4 years.  At that time she was having problems with weight loss, shakiness and was also found to have a goiter. Subsequently however she has not had any issues with her thyroid and has had periodic labs done to monitor this both in [place] and here after she moved  Recently she was having a complete annual exam and she requested testing for free T3 and free T4 in addition to her TSH Currently she is quite asymptomatic, previously was having some difficulty losing weight but was able to do so with a weight loss program. She has not complained of any symptoms of palpitations, shakiness, feeling excessively warm and sweaty, nervousness, and fatigue.  She is mildly cold intolerant.  No hot flashes. Recently also was having issues with carpal tunnel syndrome and she was asked to have her thyroid checked  With this program she was taking some herbal supplements including what she was told was sublingual HCG.  She has not taken this for about 4-5 weeks She also takes an herbal supplement which regulates her bowels. She was able to lose about 24 pounds with this program and gradually continues to lose some on maintenance regimen      Lab Results  Component Value Date   FREET4 1.69* 01/02/2015   FREET4 0.74 11/10/2011   FREET4 0.8 08/15/2006   TSH 1.11 01/02/2015   TSH 0.96 07/04/2014   TSH 1.45 10/29/2013         Medication List       This list is accurate as of: 02/05/15  3:16 PM.  Always use your most recent med list.               cycloSPORINE 0.05 % ophthalmic emulsion  Commonly known as:   RESTASIS  1 drop daily.     DULoxetine 30 MG capsule  Commonly known as:  CYMBALTA  Take 1 capsule (30 mg total) by mouth daily.     ipratropium 0.06 % nasal spray  Commonly known as:  ATROVENT  Place 2 sprays into both nostrils 4 (four) times daily. For nasal congestion and post nasal drainage     levocetirizine 5 MG tablet  Commonly known as:  XYZAL  as needed.     pravastatin 80 MG tablet  Commonly known as:  PRAVACHOL  TAKE ONE BY MOUTH ONCE DAILY     VITAMIN B COMPLEX PO  Take by mouth daily.            Past Medical History  Diagnosis Date  . Other and unspecified hyperlipidemia   .  Depressive disorder, not elsewhere classified   . Osteoarthritis   . Sleep apnea      On CPAP  . History of fractured kneecap     right   . Fibroid   . Chicken pox 69 yrs old  . Measles as a child  . Allergic state 02/01/2010    Qualifier: Diagnosis of  By: Regis Bill MD, Standley Brooking   . Overweight(278.02) 10/05/2011  . Allergy   . Thyroid disease     h/o hyperthyroid treated with radioactive iodine? from 16 to 20  . Hyperglycemia   . Back pain   . OA (osteoarthritis) of knee     left knee  . Varicose vein of leg   . Sleep apnea   . Knee pain, left 10/05/2011    Follows with Dr Margarette Canada  . Anxiety   . Stye 11/19/2012  . Obesity 10/05/2011  . Diabetes ([place])   . Osteopenia 06/26/2014  . Overweight 10/05/2011  . Type 2 diabetes mellitus with hyperlipidemia ([place])   . Medicare annual wellness visit, subsequent 07/06/2014    Past Surgical History  Procedure Laterality Date  . Back surgery      L5 discectomy, 2002. helpful  . Cpap    . Cesarean section  1987  . Knee surgery Right 2006    R knee fx. patella  . Leep      Family History  Problem Relation Age of Onset  . Hyperlipidemia Mother   . Dementia Mother   . Hyperlipidemia Father   . Heart disease Father     bypass, CAD  . Hyperlipidemia Sister   . Diabetes Sister     type 2  . Dementia Maternal Grandmother     alzheimer  .  Alcohol abuse Maternal Grandfather   . Diabetes Paternal Grandmother     type 2?    Social History:  reports that she has never smoked. She has never used smokeless tobacco. She reports that she drinks about 0.6 oz of alcohol per week. She reports that she does not use illicit drugs.  Allergies:  Allergies  Allergen Reactions  . Codeine Other (See Comments)    hallucinations  . Crestor [Rosuvastatin]     REACTION: myalgias  . Amoxicillin Rash  . Erythromycin Other (See Comments)    cramps  . Sulfonamide Derivatives Rash    Review of Systems:  Review of Systems  Constitutional: Positive for malaise/fatigue.  Respiratory: Negative for shortness of breath.   Cardiovascular: Negative for palpitations.  Gastrointestinal: Positive for constipation.     She has been actively trying to lose weight this year  Wt Readings from Last 3 Encounters:  02/05/15 149 lb 3.2 oz (67.677 kg)  01/09/15 150 lb (68.04 kg)  12/26/14 153 lb (69.4 kg)    Complaining about mild hair loss.  Also has thinning of her eyebrows   She has had hypercholesterolemia for years, on pravastatin.  Does not know what her baseline levels were   Lab Results  Component Value Date   CHOL 182 01/02/2015   HDL 43.20 01/02/2015   LDLCALC 122* 01/02/2015   LDLDIRECT 145.4 11/14/2012   TRIG 87.0 01/02/2015   CHOLHDL 4 01/02/2015      Examination:   BP 122/54 mmHg  Pulse 63  Temp(Src) 98.5 F (36.9 C) (Oral)  Resp 14  Ht 5\' 6"  (1.676 m)  Wt 149 lb 3.2 oz (67.677 kg)  BMI 24.09 kg/m2  SpO2 95%  LMP 04/04/2000   General Appearance:  well-built and nourished, pleasant, not anxious or hyperkinetic.        Eyes: No unusual prominence or stare. No swelling of the eyelids  Neck: The thyroid is not enlarged There is no lymphadenopathy .          Heart: normal S1 and S2, no murmurs .          Lungs:  exam not indicated Abdomen: Exam not indicated  Extremities: hands are slightly cool to touch. No ankle  edema. Neurological: Deep tendon reflexes at biceps are very normal. No fine tremors are present. Skin: No rash, abnormal thickening of the skin on legs or pigmentation seen.  She does have loss of eyebrow hair  partially     Assessment/Plan:  ABNORMAL thyroid functions of unclear etiology She has no clinical signs or symptoms of hyperthyroidism and no goiter She does have a remote history of probable Graves' disease in teenage years  Her TSH is quite normal ruling out primary Hyperthyroidism and unable to explain high T3 and T4 levels in isolation She may well have had some interference to the lab testing with taking herbal supplements  Will recheck her T3 and T4 levels from a different lab before evaluating further   [facility] 02/05/2015, 3:16 PM

## 2015-02-06 LAB — T4, FREE: Free T4: 1.23 ng/dL (ref 0.82–1.77)

## 2015-02-06 LAB — T3, FREE: T3, Free: 3 pg/mL (ref 2.0–4.4)

## 2015-02-06 NOTE — Progress Notes (Signed)
Quick Note:  Please let patient know that the lab result is normal and no further action needed ______ 

## 2015-02-18 ENCOUNTER — Telehealth: Payer: Self-pay | Admitting: Internal Medicine

## 2015-02-18 DIAGNOSIS — G4733 Obstructive sleep apnea (adult) (pediatric): Secondary | ICD-10-CM

## 2015-02-18 NOTE — Telephone Encounter (Signed)
lmtcb for pt.  

## 2015-02-19 NOTE — Telephone Encounter (Signed)
Ok order DME- replacement CPAP mask of choice and supplies      Dx OSA

## 2015-02-19 NOTE — Telephone Encounter (Signed)
Order placed for pt to get new CPAP mask and supplies through [facility] called and notified of the approval from CY Pt voiced understanding   Nothing further is needed

## 2015-02-19 NOTE — Telephone Encounter (Signed)
Last seen on 11/27/14 with CY  Patient Instructions     Ok to continue [facility] to take the Zpak you have  Let's wait on the prednisone for now  [facility] to continue the sleep management program as you have been doing     Called and spoke with pt. She stated that the nasal pillows have rubbed a blister on the inside of her right nostril. Due to the pain from the blister, it makes it uncomfortable for her to wear the nasal pillows at tonight. She states she would that like to try a full face mask. CY please advise if okay to place order for new mask  Allergies  Allergen Reactions  . Codeine Other (See Comments)    hallucinations  . Crestor [Rosuvastatin]     REACTION: myalgias  . Amoxicillin Rash  . Erythromycin Other (See Comments)    cramps  . Sulfonamide Derivatives Rash   Current Outpatient Prescriptions on File Prior to Visit  Medication Sig Dispense Refill  . B Complex Vitamins (VITAMIN B COMPLEX PO) Take by mouth daily.    . cycloSPORINE (RESTASIS) 0.05 % ophthalmic emulsion 1 drop daily.     . DULoxetine (CYMBALTA) 30 MG capsule Take 1 capsule (30 mg total) by mouth daily. 30 capsule 3  . ipratropium (ATROVENT) 0.06 % nasal spray Place 2 sprays into both nostrils 4 (four) times daily. For nasal congestion and post nasal drainage (Patient not taking: Reported on 02/05/2015) 15 mL 12  . levocetirizine (XYZAL) 5 MG tablet as needed.  3  . pravastatin (PRAVACHOL) 80 MG tablet TAKE ONE BY MOUTH ONCE DAILY 90 tablet 1   No current facility-administered medications on file prior to visit.

## 2015-02-24 DIAGNOSIS — G4733 Obstructive sleep apnea (adult) (pediatric): Secondary | ICD-10-CM | POA: Diagnosis not present

## 2015-03-03 DIAGNOSIS — G4733 Obstructive sleep apnea (adult) (pediatric): Secondary | ICD-10-CM | POA: Diagnosis not present

## 2015-03-20 DIAGNOSIS — H16223 Keratoconjunctivitis sicca, not specified as Sjogren's, bilateral: Secondary | ICD-10-CM | POA: Diagnosis not present

## 2015-03-20 DIAGNOSIS — H2511 Age-related nuclear cataract, right eye: Secondary | ICD-10-CM | POA: Diagnosis not present

## 2015-03-20 DIAGNOSIS — H04123 Dry eye syndrome of bilateral lacrimal glands: Secondary | ICD-10-CM | POA: Diagnosis not present

## 2015-03-20 DIAGNOSIS — H2512 Age-related nuclear cataract, left eye: Secondary | ICD-10-CM | POA: Diagnosis not present

## 2015-04-17 DIAGNOSIS — H43812 Vitreous degeneration, left eye: Secondary | ICD-10-CM | POA: Diagnosis not present

## 2015-04-17 DIAGNOSIS — H2511 Age-related nuclear cataract, right eye: Secondary | ICD-10-CM | POA: Diagnosis not present

## 2015-04-17 DIAGNOSIS — H2512 Age-related nuclear cataract, left eye: Secondary | ICD-10-CM | POA: Diagnosis not present

## 2015-04-17 DIAGNOSIS — H04123 Dry eye syndrome of bilateral lacrimal glands: Secondary | ICD-10-CM | POA: Diagnosis not present

## 2015-04-30 ENCOUNTER — Other Ambulatory Visit: Payer: Self-pay | Admitting: Family Medicine

## 2015-04-30 MED ORDER — DULOXETINE HCL 30 MG PO CPEP: 30.0000 mg | ORAL_CAPSULE | Freq: Every day | ORAL | Status: DC

## 2015-04-30 MED FILL — DULoxetine HCL 30 MG CPEP: 30 | 30 days supply | Qty: 30 | Fill #0

## 2015-04-30 NOTE — Telephone Encounter (Signed)
Pharmacy: [facility], [place]  Reason for call: Pt needing refill on DULoxetine . She is out.

## 2015-04-30 NOTE — Telephone Encounter (Signed)
Rx sent 

## 2015-06-12 DIAGNOSIS — J3089 Other allergic rhinitis: Secondary | ICD-10-CM | POA: Diagnosis not present

## 2015-06-29 ENCOUNTER — Telehealth: Payer: Self-pay | Admitting: Family Medicine

## 2015-06-29 MED ORDER — PRAVASTATIN SODIUM 80 MG PO TABS: ORAL_TABLET | ORAL | Status: DC

## 2015-06-29 MED FILL — PRAVASTATIN SODIUM 80 MG TA: 80 | 90 days supply | Qty: 90 | Fill #0

## 2015-06-29 NOTE — Telephone Encounter (Signed)
Pharmacy: [facility], [place]  Reason for call: pt out of pravastatin

## 2015-07-03 ENCOUNTER — Other Ambulatory Visit: Payer: Medicare HMO

## 2015-07-06 ENCOUNTER — Other Ambulatory Visit (INDEPENDENT_AMBULATORY_CARE_PROVIDER_SITE_OTHER): Payer: Medicare HMO

## 2015-07-06 DIAGNOSIS — E785 Hyperlipidemia, unspecified: Secondary | ICD-10-CM | POA: Diagnosis not present

## 2015-07-06 DIAGNOSIS — R030 Elevated blood-pressure reading, without diagnosis of hypertension: Secondary | ICD-10-CM | POA: Diagnosis not present

## 2015-07-06 DIAGNOSIS — E0590 Thyrotoxicosis, unspecified without thyrotoxic crisis or storm: Secondary | ICD-10-CM

## 2015-07-06 DIAGNOSIS — Z0000 Encounter for general adult medical examination without abnormal findings: Secondary | ICD-10-CM | POA: Diagnosis not present

## 2015-07-06 DIAGNOSIS — IMO0001 Reserved for inherently not codable concepts without codable children: Secondary | ICD-10-CM

## 2015-07-06 DIAGNOSIS — E1169 Type 2 diabetes mellitus with other specified complication: Secondary | ICD-10-CM | POA: Diagnosis not present

## 2015-07-06 DIAGNOSIS — E782 Mixed hyperlipidemia: Secondary | ICD-10-CM

## 2015-07-06 LAB — COMPREHENSIVE METABOLIC PANEL
ALT: 73 U/L — AB (ref 0–35)
AST: 40 U/L — AB (ref 0–37)
Albumin: 3.9 g/dL (ref 3.5–5.2)
Alkaline Phosphatase: 75 U/L (ref 39–117)
BUN: 20 mg/dL (ref 6–23)
CALCIUM: 9.8 mg/dL (ref 8.4–10.5)
CHLORIDE: 106 meq/L (ref 96–112)
CO2: 30 mEq/L (ref 19–32)
Creatinine, Ser: 0.83 mg/dL (ref 0.40–1.20)
GFR: 72.67 mL/min (ref >60.00)
GLUCOSE: 85 mg/dL (ref 70–99)
POTASSIUM: 4.6 meq/L (ref 3.5–5.1)
Sodium: 140 mEq/L (ref 135–145)
Total Bilirubin: 0.5 mg/dL (ref 0.2–1.2)
Total Protein: 6.8 g/dL (ref 6.0–8.3)

## 2015-07-06 LAB — CBC
HCT: 39.5 % (ref 36.0–46.0)
Hemoglobin: 13.4 g/dL (ref 12.0–15.0)
MCHC: 34 g/dL (ref 30.0–36.0)
MCV: 88.7 fl (ref 78.0–100.0)
PLATELETS: 342 10*3/uL (ref 150.0–400.0)
RBC: 4.45 Mil/uL (ref 3.87–5.11)
RDW: 14.3 % (ref 11.5–15.5)
WBC: 6.9 10*3/uL (ref 4.0–10.5)

## 2015-07-06 LAB — TSH: TSH: 1.56 u[IU]/mL (ref 0.35–4.50)

## 2015-07-06 LAB — LIPID PANEL
CHOL/HDL RATIO: 4
CHOLESTEROL: 220 mg/dL — AB (ref 0–200)
HDL: 51.6 mg/dL (ref >39.00)
LDL CALC: 154 mg/dL — AB (ref 0–99)
NonHDL: 168.34
TRIGLYCERIDES: 70 mg/dL (ref 0.0–149.0)
VLDL: 14 mg/dL (ref 0.0–40.0)

## 2015-07-06 LAB — HEPATITIS C ANTIBODY: HCV Ab: NEGATIVE

## 2015-07-06 LAB — HEMOGLOBIN A1C: Hgb A1c MFr Bld: 5.9 % (ref 4.6–6.5)

## 2015-07-09 ENCOUNTER — Encounter: Payer: Self-pay | Admitting: *Deleted

## 2015-07-09 ENCOUNTER — Telehealth: Payer: Self-pay | Admitting: *Deleted

## 2015-07-09 NOTE — Telephone Encounter (Signed)
Pre-Visit Call completed with patient and chart updated.   Pre-Visit Info documented in Specialty Comments under [employer].    

## 2015-07-10 ENCOUNTER — Ambulatory Visit (INDEPENDENT_AMBULATORY_CARE_PROVIDER_SITE_OTHER): Payer: Medicare HMO | Admitting: Family Medicine

## 2015-07-10 ENCOUNTER — Encounter: Payer: Self-pay | Admitting: Family Medicine

## 2015-07-10 VITALS — BP 128/49 | HR 67 | Temp 98.4°F | Ht 66.0 in | Wt 149.2 lb

## 2015-07-10 DIAGNOSIS — Z0000 Encounter for general adult medical examination without abnormal findings: Secondary | ICD-10-CM

## 2015-07-10 DIAGNOSIS — E782 Mixed hyperlipidemia: Secondary | ICD-10-CM

## 2015-07-10 DIAGNOSIS — R030 Elevated blood-pressure reading, without diagnosis of hypertension: Secondary | ICD-10-CM

## 2015-07-10 DIAGNOSIS — R7989 Other specified abnormal findings of blood chemistry: Secondary | ICD-10-CM | POA: Diagnosis not present

## 2015-07-10 DIAGNOSIS — R110 Nausea: Secondary | ICD-10-CM

## 2015-07-10 DIAGNOSIS — E1169 Type 2 diabetes mellitus with other specified complication: Secondary | ICD-10-CM | POA: Diagnosis not present

## 2015-07-10 DIAGNOSIS — Z1211 Encounter for screening for malignant neoplasm of colon: Secondary | ICD-10-CM

## 2015-07-10 DIAGNOSIS — R945 Abnormal results of liver function studies: Secondary | ICD-10-CM

## 2015-07-10 DIAGNOSIS — E785 Hyperlipidemia, unspecified: Secondary | ICD-10-CM

## 2015-07-10 NOTE — Assessment & Plan Note (Signed)
Tolerating statin, encouraged heart healthy diet, avoid trans fats, minimize simple carbs and saturated fats. Increase exercise as tolerated 

## 2015-07-10 NOTE — Progress Notes (Signed)
Pre visit review using our clinic review tool, if applicable. No additional management support is needed unless otherwise documented below in the visit note. 

## 2015-07-10 NOTE — Patient Instructions (Addendum)
[email or web address] or [facility] for NOW probiotic 10 strain cap daily  Preventive Care for Adults, Female A healthy lifestyle and preventive care can promote health and wellness. Preventive health guidelines for women include the following key practices.  A routine yearly physical is a good way to check with your health care provider about your health and preventive screening. It is a chance to share any concerns and updates on your health and to receive a thorough exam.  Visit your dentist for a routine exam and preventive care every 6 months. Brush your teeth twice a day and floss once a day. Good oral hygiene prevents tooth decay and gum disease.  The frequency of eye exams is based on your age, health, family medical history, use of contact lenses, and other factors. Follow your health care provider's recommendations for frequency of eye exams.  Eat a healthy diet. Foods like vegetables, fruits, whole grains, low-fat dairy products, and lean protein foods contain the nutrients you need without too many calories. Decrease your intake of foods high in solid fats, added sugars, and salt. Eat the right amount of calories for you.Get information about a proper diet from your health care provider, if necessary.  Regular physical exercise is one of the most important things you can do for your health. Most adults should get at least 150 minutes of moderate-intensity exercise (any activity that increases your heart rate and causes you to sweat) each week. In addition, most adults need muscle-strengthening exercises on 2 or more days a week.  Maintain a healthy weight. The body mass index (BMI) is a screening tool to identify possible weight problems. It provides an estimate of body fat based on height and weight. Your health care provider can find your BMI and can help you achieve or maintain a healthy weight.For adults 20 years and older:  A BMI below 18.5 is considered underweight.  A BMI  of 18.5 to 24.9 is normal.  A BMI of 25 to 29.9 is considered overweight.  A BMI of 30 and above is considered obese.  Maintain normal blood lipids and cholesterol levels by exercising and minimizing your intake of saturated fat. Eat a balanced diet with plenty of fruit and vegetables. Blood tests for lipids and cholesterol should begin at age 51 and be repeated every 5 years. If your lipid or cholesterol levels are high, you are over 50, or you are at high risk for heart disease, you may need your cholesterol levels checked more frequently.Ongoing high lipid and cholesterol levels should be treated with medicines if diet and exercise are not working.  If you smoke, find out from your health care provider how to quit. If you do not use tobacco, do not start.  Lung cancer screening is recommended for adults aged 76-80 years who are at high risk for developing lung cancer because of a history of smoking. A yearly low-dose CT scan of the lungs is recommended for people who have at least a 30-pack-year history of smoking and are a current smoker or have quit within the past 15 years. A pack year of smoking is smoking an average of 1 pack of cigarettes a day for 1 year (for example: 1 pack a day for 30 years or 2 packs a day for 15 years). Yearly screening should continue until the smoker has stopped smoking for at least 15 years. Yearly screening should be stopped for people who develop a health problem that would prevent them from having lung cancer  treatment.  If you are pregnant, do not drink alcohol. If you are breastfeeding, be very cautious about drinking alcohol. If you are not pregnant and choose to drink alcohol, do not have more than 1 drink per day. One drink is considered to be 12 ounces (355 mL) of beer, 5 ounces (148 mL) of wine, or 1.5 ounces (44 mL) of liquor.  Avoid use of street drugs. Do not share needles with anyone. Ask for help if you need support or instructions about stopping the  use of drugs.  High blood pressure causes heart disease and increases the risk of stroke. Your blood pressure should be checked at least every 1 to 2 years. Ongoing high blood pressure should be treated with medicines if weight loss and exercise do not work.  If you are 43-65 years old, ask your health care provider if you should take aspirin to prevent strokes.  Diabetes screening is done by taking a blood sample to check your blood glucose level after you have not eaten for a certain period of time (fasting). If you are not overweight and you do not have risk factors for diabetes, you should be screened once every 3 years starting at age 32. If you are overweight or obese and you are 12-85 years of age, you should be screened for diabetes every year as part of your cardiovascular risk assessment.  Breast cancer screening is essential preventive care for women. You should practice "breast self-awareness." This means understanding the normal appearance and feel of your breasts and may include breast self-examination. Any changes detected, no matter how small, should be reported to a health care provider. Women in their 80s and 30s should have a clinical breast exam (CBE) by a health care provider as part of a regular health exam every 1 to 3 years. After age 91, women should have a CBE every year. Starting at age 31, women should consider having a mammogram (breast X-ray test) every year. Women who have a family history of breast cancer should talk to their health care provider about genetic screening. Women at a high risk of breast cancer should talk to their health care providers about having an MRI and a mammogram every year.  Breast cancer gene (BRCA)-related cancer risk assessment is recommended for women who have family members with BRCA-related cancers. BRCA-related cancers include breast, ovarian, tubal, and peritoneal cancers. Having family members with these cancers may be associated with an  increased risk for harmful changes (mutations) in the breast cancer genes BRCA1 and BRCA2. Results of the assessment will determine the need for genetic counseling and BRCA1 and BRCA2 testing.  Your health care provider may recommend that you be screened regularly for cancer of the pelvic organs (ovaries, uterus, and vagina). This screening involves a pelvic examination, including checking for microscopic changes to the surface of your cervix (Pap test). You may be encouraged to have this screening done every 3 years, beginning at age 20.  For women ages 29-65, health care providers may recommend pelvic exams and Pap testing every 3 years, or they may recommend the Pap and pelvic exam, combined with testing for human papilloma virus (HPV), every 5 years. Some types of HPV increase your risk of cervical cancer. Testing for HPV may also be done on women of any age with unclear Pap test results.  Other health care providers may not recommend any screening for nonpregnant women who are considered low risk for pelvic cancer and who do not have symptoms.  Ask your health care provider if a screening pelvic exam is right for you.  If you have had past treatment for cervical cancer or a condition that could lead to cancer, you need Pap tests and screening for cancer for at least 20 years after your treatment. If Pap tests have been discontinued, your risk factors (such as having a new sexual partner) need to be reassessed to determine if screening should resume. Some women have medical problems that increase the chance of getting cervical cancer. In these cases, your health care provider may recommend more frequent screening and Pap tests.  Colorectal cancer can be detected and often prevented. Most routine colorectal cancer screening begins at the age of 50 years and continues through age 80 years. However, your health care provider may recommend screening at an earlier age if you have risk factors for colon  cancer. On a yearly basis, your health care provider may provide home test kits to check for hidden blood in the stool. Use of a small camera at the end of a tube, to directly examine the colon (sigmoidoscopy or colonoscopy), can detect the earliest forms of colorectal cancer. Talk to your health care provider about this at age 62, when routine screening begins. Direct exam of the colon should be repeated every 5-10 years through age 90 years, unless early forms of precancerous polyps or small growths are found.  People who are at an increased risk for hepatitis B should be screened for this virus. You are considered at high risk for hepatitis B if:  You were born in a country where hepatitis B occurs often. Talk with your health care provider about which countries are considered high risk.  Your parents were born in a high-risk country and you have not received a shot to protect against hepatitis B (hepatitis B vaccine).  You have HIV or AIDS.  You use needles to inject street drugs.  You live with, or have sex with, someone who has hepatitis B.  You get hemodialysis treatment.  You take certain medicines for conditions like cancer, organ transplantation, and autoimmune conditions.  Hepatitis C blood testing is recommended for all people born from [birth year] through 1965 and any individual with known risks for hepatitis C.  Practice safe sex. Use condoms and avoid high-risk sexual practices to reduce the spread of sexually transmitted infections (STIs). STIs include gonorrhea, chlamydia, syphilis, trichomonas, herpes, HPV, and human immunodeficiency virus (HIV). Herpes, HIV, and HPV are viral illnesses that have no cure. They can result in disability, cancer, and death.  You should be screened for sexually transmitted illnesses (STIs) including gonorrhea and chlamydia if:  You are sexually active and are younger than 24 years.  You are older than 24 years and your health care provider tells you  that you are at risk for this type of infection.  Your sexual activity has changed since you were last screened and you are at an increased risk for chlamydia or gonorrhea. Ask your health care provider if you are at risk.  If you are at risk of being infected with HIV, it is recommended that you take a prescription medicine daily to prevent HIV infection. This is called preexposure prophylaxis (PrEP). You are considered at risk if:  You are sexually active and do not regularly use condoms or know the HIV status of your partner(s).  You take drugs by injection.  You are sexually active with a partner who has HIV.  Talk with your health care provider  about whether you are at high risk of being infected with HIV. If you choose to begin PrEP, you should first be tested for HIV. You should then be tested every 3 months for as long as you are taking PrEP.  Osteoporosis is a disease in which the bones lose minerals and strength with aging. This can result in serious bone fractures or breaks. The risk of osteoporosis can be identified using a bone density scan. Women ages 43 years and over and women at risk for fractures or osteoporosis should discuss screening with their health care providers. Ask your health care provider whether you should take a calcium supplement or vitamin D to reduce the rate of osteoporosis.  Menopause can be associated with physical symptoms and risks. Hormone replacement therapy is available to decrease symptoms and risks. You should talk to your health care provider about whether hormone replacement therapy is right for you.  Use sunscreen. Apply sunscreen liberally and repeatedly throughout the day. You should seek shade when your shadow is shorter than you. Protect yourself by wearing long sleeves, pants, a wide-brimmed hat, and sunglasses year round, whenever you are outdoors.  Once a month, do a whole body skin exam, using a mirror to look at the skin on your back. Tell  your health care provider of new moles, moles that have irregular borders, moles that are larger than a pencil eraser, or moles that have changed in shape or color.  Stay current with required vaccines (immunizations).  Influenza vaccine. All adults should be immunized every year.  Tetanus, diphtheria, and acellular pertussis (Td, Tdap) vaccine. Pregnant women should receive 1 dose of Tdap vaccine during each pregnancy. The dose should be obtained regardless of the length of time since the last dose. Immunization is preferred during the 27th-36th week of gestation. An adult who has not previously received Tdap or who does not know her vaccine status should receive 1 dose of Tdap. This initial dose should be followed by tetanus and diphtheria toxoids (Td) booster doses every 10 years. Adults with an unknown or incomplete history of completing a 3-dose immunization series with Td-containing vaccines should begin or complete a primary immunization series including a Tdap dose. Adults should receive a Td booster every 10 years.  Varicella vaccine. An adult without evidence of immunity to varicella should receive 2 doses or a second dose if she has previously received 1 dose. Pregnant females who do not have evidence of immunity should receive the first dose after pregnancy. This first dose should be obtained before leaving the health care facility. The second dose should be obtained 4-8 weeks after the first dose.  Human papillomavirus (HPV) vaccine. Females aged 13-26 years who have not received the vaccine previously should obtain the 3-dose series. The vaccine is not recommended for use in pregnant females. However, pregnancy testing is not needed before receiving a dose. If a female is found to be pregnant after receiving a dose, no treatment is needed. In that case, the remaining doses should be delayed until after the pregnancy. Immunization is recommended for any person with an immunocompromised  condition through the age of 29 years if she did not get any or all doses earlier. During the 3-dose series, the second dose should be obtained 4-8 weeks after the first dose. The third dose should be obtained 24 weeks after the first dose and 16 weeks after the second dose.  Zoster vaccine. One dose is recommended for adults aged 41 years or older unless  certain conditions are present.  Measles, mumps, and rubella (MMR) vaccine. Adults born before [birth year] generally are considered immune to measles and mumps. Adults born in [birth year] or later should have 1 or more doses of MMR vaccine unless there is a contraindication to the vaccine or there is laboratory evidence of immunity to each of the three diseases. A routine second dose of MMR vaccine should be obtained at least 28 days after the first dose for students attending postsecondary schools, health care workers, or international travelers. People who received inactivated measles vaccine or an unknown type of measles vaccine during 1963-1967 should receive 2 doses of MMR vaccine. People who received inactivated mumps vaccine or an unknown type of mumps vaccine before 1979 and are at high risk for mumps infection should consider immunization with 2 doses of MMR vaccine. For females of childbearing age, rubella immunity should be determined. If there is no evidence of immunity, females who are not pregnant should be vaccinated. If there is no evidence of immunity, females who are pregnant should delay immunization until after pregnancy. Unvaccinated health care workers born before [birth year] who lack laboratory evidence of measles, mumps, or rubella immunity or laboratory confirmation of disease should consider measles and mumps immunization with 2 doses of MMR vaccine or rubella immunization with 1 dose of MMR vaccine.  Pneumococcal 13-valent conjugate (PCV13) vaccine. When indicated, a person who is uncertain of his immunization history and has no record of immunization  should receive the PCV13 vaccine. All adults 74 years of age and older should receive this vaccine. An adult aged 71 years or older who has certain medical conditions and has not been previously immunized should receive 1 dose of PCV13 vaccine. This PCV13 should be followed with a dose of pneumococcal polysaccharide (PPSV23) vaccine. Adults who are at high risk for pneumococcal disease should obtain the PPSV23 vaccine at least 8 weeks after the dose of PCV13 vaccine. Adults older than 68 years of age who have normal immune system function should obtain the PPSV23 vaccine dose at least 1 year after the dose of PCV13 vaccine.  Pneumococcal polysaccharide (PPSV23) vaccine. When PCV13 is also indicated, PCV13 should be obtained first. All adults aged 10 years and older should be immunized. An adult younger than age 75 years who has certain medical conditions should be immunized. Any person who resides in a nursing home or long-term care facility should be immunized. An adult smoker should be immunized. People with an immunocompromised condition and certain other conditions should receive both PCV13 and PPSV23 vaccines. People with human immunodeficiency virus (HIV) infection should be immunized as soon as possible after diagnosis. Immunization during chemotherapy or radiation therapy should be avoided. Routine use of PPSV23 vaccine is not recommended for American Indians, [place] Natives, or people younger than 65 years unless there are medical conditions that require PPSV23 vaccine. When indicated, people who have unknown immunization and have no record of immunization should receive PPSV23 vaccine. One-time revaccination 5 years after the first dose of PPSV23 is recommended for people aged 19-64 years who have chronic kidney failure, nephrotic syndrome, asplenia, or immunocompromised conditions. People who received 1-2 doses of PPSV23 before age 4 years should receive another dose of PPSV23 vaccine at age 93 years  or later if at least 5 years have passed since the previous dose. Doses of PPSV23 are not needed for people immunized with PPSV23 at or after age 69 years.  Meningococcal vaccine. Adults with asplenia or persistent complement component deficiencies should receive  2 doses of quadrivalent meningococcal conjugate (MenACWY-D) vaccine. The doses should be obtained at least 2 months apart. Microbiologists working with certain meningococcal bacteria, [employer] recruits, people at risk during an outbreak, and people who travel to or live in countries with a high rate of meningitis should be immunized. A first-year college student up through age 37 years who is living in a residence hall should receive a dose if she did not receive a dose on or after her 16th birthday. Adults who have certain high-risk conditions should receive one or more doses of vaccine.  Hepatitis A vaccine. Adults who wish to be protected from this disease, have certain high-risk conditions, work with hepatitis A-infected animals, work in hepatitis A research labs, or travel to or work in countries with a high rate of hepatitis A should be immunized. Adults who were previously unvaccinated and who anticipate close contact with an international adoptee during the first 60 days after arrival in the [place] from a country with a high rate of hepatitis A should be immunized.  Hepatitis B vaccine. Adults who wish to be protected from this disease, have certain high-risk conditions, may be exposed to blood or other infectious body fluids, are household contacts or sex partners of hepatitis B positive people, are clients or workers in certain care facilities, or travel to or work in countries with a high rate of hepatitis B should be immunized.  Haemophilus influenzae type b (Hib) vaccine. A previously unvaccinated person with asplenia or sickle cell disease or having a scheduled splenectomy should receive 1 dose of Hib vaccine. Regardless of  previous immunization, a recipient of a hematopoietic stem cell transplant should receive a 3-dose series 6-12 months after her successful transplant. Hib vaccine is not recommended for adults with HIV infection. Preventive Services / Frequency Ages 43 to 77 years  Blood pressure check.** / Every 3-5 years.  Lipid and cholesterol check.** / Every 5 years beginning at age 71.  Clinical breast exam.** / Every 3 years for women in their 84s and 20s.  BRCA-related cancer risk assessment.** / For women who have family members with a BRCA-related cancer (breast, ovarian, tubal, or peritoneal cancers).  Pap test.** / Every 2 years from ages 88 through 25. Every 3 years starting at age 29 through age 53 or 56 with a history of 3 consecutive normal Pap tests.  HPV screening.** / Every 3 years from ages 31 through ages 14 to 23 with a history of 3 consecutive normal Pap tests.  Hepatitis C blood test.** / For any individual with known risks for hepatitis C.  Skin self-exam. / Monthly.  Influenza vaccine. / Every year.  Tetanus, diphtheria, and acellular pertussis (Tdap, Td) vaccine.** / Consult your health care provider. Pregnant women should receive 1 dose of Tdap vaccine during each pregnancy. 1 dose of Td every 10 years.  Varicella vaccine.** / Consult your health care provider. Pregnant females who do not have evidence of immunity should receive the first dose after pregnancy.  HPV vaccine. / 3 doses over 6 months, if 49 and younger. The vaccine is not recommended for use in pregnant females. However, pregnancy testing is not needed before receiving a dose.  Measles, mumps, rubella (MMR) vaccine.** / You need at least 1 dose of MMR if you were born in 1957 or later. You may also need a 2nd dose. For females of childbearing age, rubella immunity should be determined. If there is no evidence of immunity, females who are not pregnant  should be vaccinated. If there is no evidence of immunity,  females who are pregnant should delay immunization until after pregnancy.  Pneumococcal 13-valent conjugate (PCV13) vaccine.** / Consult your health care provider.  Pneumococcal polysaccharide (PPSV23) vaccine.** / 1 to 2 doses if you smoke cigarettes or if you have certain conditions.  Meningococcal vaccine.** / 1 dose if you are age 8 to 75 years and a Market researcher living in a residence hall, or have one of several medical conditions, you need to get vaccinated against meningococcal disease. You may also need additional booster doses.  Hepatitis A vaccine.** / Consult your health care provider.  Hepatitis B vaccine.** / Consult your health care provider.  Haemophilus influenzae type b (Hib) vaccine.** / Consult your health care provider. Ages 63 to 30 years  Blood pressure check.** / Every year.  Lipid and cholesterol check.** / Every 5 years beginning at age 19 years.  Lung cancer screening. / Every year if you are aged 14-80 years and have a 30-pack-year history of smoking and currently smoke or have quit within the past 15 years. Yearly screening is stopped once you have quit smoking for at least 15 years or develop a health problem that would prevent you from having lung cancer treatment.  Clinical breast exam.** / Every year after age 62 years.  BRCA-related cancer risk assessment.** / For women who have family members with a BRCA-related cancer (breast, ovarian, tubal, or peritoneal cancers).  Mammogram.** / Every year beginning at age 17 years and continuing for as long as you are in good health. Consult with your health care provider.  Pap test.** / Every 3 years starting at age 5 years through age 71 or 74 years with a history of 3 consecutive normal Pap tests.  HPV screening.** / Every 3 years from ages 10 years through ages 68 to 68 years with a history of 3 consecutive normal Pap tests.  Fecal occult blood test (FOBT) of stool. / Every year beginning at  age 72 years and continuing until age 37 years. You may not need to do this test if you get a colonoscopy every 10 years.  Flexible sigmoidoscopy or colonoscopy.** / Every 5 years for a flexible sigmoidoscopy or every 10 years for a colonoscopy beginning at age 20 years and continuing until age 33 years.  Hepatitis C blood test.** / For all people born from [birth year] through 1963 and any individual with known risks for hepatitis C.  Skin self-exam. / Monthly.  Influenza vaccine. / Every year.  Tetanus, diphtheria, and acellular pertussis (Tdap/Td) vaccine.** / Consult your health care provider. Pregnant women should receive 1 dose of Tdap vaccine during each pregnancy. 1 dose of Td every 10 years.  Varicella vaccine.** / Consult your health care provider. Pregnant females who do not have evidence of immunity should receive the first dose after pregnancy.  Zoster vaccine.** / 1 dose for adults aged 23 years or older.  Measles, mumps, rubella (MMR) vaccine.** / You need at least 1 dose of MMR if you were born in 1957 or later. You may also need a second dose. For females of childbearing age, rubella immunity should be determined. If there is no evidence of immunity, females who are not pregnant should be vaccinated. If there is no evidence of immunity, females who are pregnant should delay immunization until after pregnancy.  Pneumococcal 13-valent conjugate (PCV13) vaccine.** / Consult your health care provider.  Pneumococcal polysaccharide (PPSV23) vaccine.** / 1 to 2 doses if  you smoke cigarettes or if you have certain conditions.  Meningococcal vaccine.** / Consult your health care provider.  Hepatitis A vaccine.** / Consult your health care provider.  Hepatitis B vaccine.** / Consult your health care provider.  Haemophilus influenzae type b (Hib) vaccine.** / Consult your health care provider. Ages 37 years and over  Blood pressure check.** / Every year.  Lipid and cholesterol check.**  / Every 5 years beginning at age 65 years.  Lung cancer screening. / Every year if you are aged 75-80 years and have a 30-pack-year history of smoking and currently smoke or have quit within the past 15 years. Yearly screening is stopped once you have quit smoking for at least 15 years or develop a health problem that would prevent you from having lung cancer treatment.  Clinical breast exam.** / Every year after age 96 years.  BRCA-related cancer risk assessment.** / For women who have family members with a BRCA-related cancer (breast, ovarian, tubal, or peritoneal cancers).  Mammogram.** / Every year beginning at age 68 years and continuing for as long as you are in good health. Consult with your health care provider.  Pap test.** / Every 3 years starting at age 53 years through age 33 or 71 years with 3 consecutive normal Pap tests. Testing can be stopped between 65 and 70 years with 3 consecutive normal Pap tests and no abnormal Pap or HPV tests in the past 10 years.  HPV screening.** / Every 3 years from ages 50 years through ages 20 or 65 years with a history of 3 consecutive normal Pap tests. Testing can be stopped between 65 and 70 years with 3 consecutive normal Pap tests and no abnormal Pap or HPV tests in the past 10 years.  Fecal occult blood test (FOBT) of stool. / Every year beginning at age 44 years and continuing until age 60 years. You may not need to do this test if you get a colonoscopy every 10 years.  Flexible sigmoidoscopy or colonoscopy.** / Every 5 years for a flexible sigmoidoscopy or every 10 years for a colonoscopy beginning at age 43 years and continuing until age 67 years.  Hepatitis C blood test.** / For all people born from [birth year] through 1965 and any individual with known risks for hepatitis C.  Osteoporosis screening.** / A one-time screening for women ages 66 years and over and women at risk for fractures or osteoporosis.  Skin self-exam. / Monthly.  Influenza  vaccine. / Every year.  Tetanus, diphtheria, and acellular pertussis (Tdap/Td) vaccine.** / 1 dose of Td every 10 years.  Varicella vaccine.** / Consult your health care provider.  Zoster vaccine.** / 1 dose for adults aged 62 years or older.  Pneumococcal 13-valent conjugate (PCV13) vaccine.** / Consult your health care provider.  Pneumococcal polysaccharide (PPSV23) vaccine.** / 1 dose for all adults aged 60 years and older.  Meningococcal vaccine.** / Consult your health care provider.  Hepatitis A vaccine.** / Consult your health care provider.  Hepatitis B vaccine.** / Consult your health care provider.  Haemophilus influenzae type b (Hib) vaccine.** / Consult your health care provider. ** Family history and personal history of risk and conditions may change your health care provider's recommendations.   This information is not intended to replace advice given to you by your health care provider. Make sure you discuss any questions you have with your health care provider.   Document Released: 05/17/2001 Document Revised: 04/11/2014 Document Reviewed: 08/16/2010 Elsevier Interactive Patient Education 2016 Elsevier  Inc.

## 2015-07-10 NOTE — Assessment & Plan Note (Signed)
Patient encouraged to maintain heart healthy diet, regular exercise, adequate sleep. Consider daily probiotics. Take medications as prescribed  referred Pharmacy and which med where: [place], [place]: Pt also uses other [facility] pharmacies. Verify pharmacy w/ pt before sending rx.  Allergies verified: Yes  Immunization Status: Flu vaccine: 12/30/14 Tdap: 06/02/12 PNA: [id number] 01/29/15 Shingles: 10/05/11  A/P:  Changes to FH, PSH or Personal Hx: None Pap: N/A MMG: 02/05/15 Bi-rads Category 1: Negative Bone Density: 07/22/14 Osteopenia  CCS: 04/05/07 Historical   Care Teams Updated: Regina Eck, CNM-OB/GYN Dr. Tarri Fuller Young-Pulmonology  Dr. Delma Officer Dr. Curt Bears Hecker-Opthalmology  Dr. Juanda Crumble Fields-Vascular Surgery Marica Otter, OD-Optometry Milus Glazier, DC-Chiropractor   ED/Hospital/Urgent Care Visits: None  To Discuss with Provider: Pt stopped taking Cymbalta 3 weeks ago. She self tapered dose. She is taking multiple herbal supplements that she will bring to appt. She is concerned about her elevated LFTs.

## 2015-07-10 NOTE — Progress Notes (Signed)
Subjective:    Patient ID: Cindy [name], female    DOB: June 13, 1945, 71 y.o.   MRN: [id number]  Chief Complaint  Patient presents with  . Annual Exam    HPI Patient is in today for Annual Physical Exam.  Patient has some concerns with nausea.  Patient has improved diet and changed eating habits.  Denies CP/palp/SOB/HA/congestion/fevers/GI or GU c/o. Taking meds as prescribed.     Past Medical History  Diagnosis Date  . Other and unspecified hyperlipidemia   . Depressive disorder, not elsewhere classified   . Osteoarthritis   . Sleep apnea      On CPAP  . History of fractured kneecap     right   . Fibroid   . Chicken pox 70 yrs old  . Measles as a child  . Allergic state 02/01/2010    Qualifier: Diagnosis of  By: Regis Bill MD, Standley Brooking   . Overweight(278.02) 10/05/2011  . Allergy   . Thyroid disease     h/o hyperthyroid treated with radioactive iodine? from 16 to 20  . Hyperglycemia   . Back pain   . OA (osteoarthritis) of knee     left knee  . Varicose vein of leg   . Sleep apnea   . Knee pain, left 10/05/2011    Follows with Dr Margarette Canada  . Anxiety   . Stye 11/19/2012  . Obesity 10/05/2011  . Diabetes ([place])   . Osteopenia 06/26/2014  . Overweight 10/05/2011  . Type 2 diabetes mellitus with hyperlipidemia ([place])   . Medicare annual wellness visit, subsequent 07/06/2014  . Abnormal liver function tests 07/19/2015    Past Surgical History  Procedure Laterality Date  . Back surgery      L5 discectomy, 2002. helpful  . Cpap    . Cesarean section  1987  . Knee surgery Right 2006    R knee fx. patella  . Leep      Family History  Problem Relation Age of Onset  . Hyperlipidemia Mother   . Dementia Mother   . Hyperlipidemia Father   . Heart disease Father     bypass, CAD  . Hyperlipidemia Sister   . Diabetes Sister     type 2  . Dementia Maternal Grandmother     alzheimer  . Alcohol abuse Maternal Grandfather   . Diabetes Paternal Grandmother     type 2?     Social History   Social History  . Marital Status: Divorced    Spouse Name: N/A  . Number of Children: N/A  . Years of Education: N/A   Occupational History  . Not on file.   Social History Main Topics  . Smoking status: Never Smoker   . Smokeless tobacco: Never Used  . Alcohol Use: 0.6 oz/week    1 Glasses of wine per week  . Drug Use: No  . Sexual Activity: No   Other Topics Concern  . Not on file   Social History Narrative   Recently divorced separated fall 2012    Now living with friend in [place] area .   Never smoker   Regular exercise   Sleep 01/12/29 up at 6; job hours irreg              Outpatient Prescriptions Prior to Visit  Medication Sig Dispense Refill  . B Complex Vitamins (VITAMIN B COMPLEX PO) Take by mouth daily.    . cycloSPORINE (RESTASIS) 0.05 % ophthalmic emulsion Place 1 drop into  both eyes daily as needed. Reported on 07/09/2015    . ipratropium (ATROVENT) 0.06 % nasal spray Place 2 sprays into both nostrils 4 (four) times daily. For nasal congestion and post nasal drainage (Patient taking differently: Place 2 sprays into both nostrils as needed. For nasal congestion and post nasal drainage) 15 mL 12  . levocetirizine (XYZAL) 5 MG tablet Take 5 mg by mouth as needed.   3  . pravastatin (PRAVACHOL) 80 MG tablet TAKE ONE BY MOUTH ONCE DAILY (Patient taking differently: 40 mg. TAKE ONE BY MOUTH ONCE DAILY) 90 tablet 1  . DULoxetine (CYMBALTA) 30 MG capsule Take 1 capsule (30 mg total) by mouth daily. 30 capsule 3  . OVER THE COUNTER MEDICATION Pt states she is taking multiple OTC supplements and she will bring to appt.     No facility-administered medications prior to visit.    Allergies  Allergen Reactions  . Codeine Other (See Comments)    hallucinations  . Crestor [Rosuvastatin]     REACTION: myalgias  . Amoxicillin Rash  . Erythromycin Other (See Comments)    cramps  . Sulfonamide Derivatives Rash    Review of Systems   Constitutional: Negative for fever and malaise/fatigue.  HENT: Negative for congestion.   Eyes: Negative for blurred vision.  Respiratory: Negative for shortness of breath.   Cardiovascular: Negative for chest pain, palpitations and leg swelling.  Gastrointestinal: Positive for nausea and vomiting. Negative for abdominal pain and blood in stool.  Genitourinary: Negative for dysuria and frequency.  Musculoskeletal: Negative for falls.  Skin: Negative for rash.  Neurological: Negative for dizziness, loss of consciousness and headaches.  Endo/Heme/Allergies: Negative for environmental allergies.  Psychiatric/Behavioral: Negative for depression. The patient is not nervous/anxious.        Objective:    Physical Exam  Constitutional: She is oriented to person, place, and time. She appears well-developed and well-nourished. No distress.  HENT:  Head: Normocephalic and atraumatic.  Eyes: Conjunctivae are normal.  Neck: Neck supple. No thyromegaly present.  Cardiovascular: Normal rate, regular rhythm and normal heart sounds.   No murmur heard. Pulmonary/Chest: Effort normal and breath sounds normal. No respiratory distress.  Abdominal: Soft. Bowel sounds are normal. She exhibits no distension and no mass. There is no tenderness.  Musculoskeletal: She exhibits no edema.  Lymphadenopathy:    She has no cervical adenopathy.  Neurological: She is alert and oriented to person, place, and time.  Skin: Skin is warm and dry.  Psychiatric: She has a normal mood and affect. Her behavior is normal.    BP 128/49 mmHg  Pulse 67  Temp(Src) 98.4 F (36.9 C) (Oral)  Ht 5\' 6"  (1.676 m)  Wt 149 lb 4 oz (67.699 kg)  BMI 24.10 kg/m2  SpO2 100%  LMP 04/04/2000 Wt Readings from Last 3 Encounters:  07/10/15 149 lb 4 oz (67.699 kg)  02/05/15 149 lb 3.2 oz (67.677 kg)  01/09/15 150 lb (68.04 kg)     Lab Results  Component Value Date   WBC 6.9 07/06/2015   HGB 13.4 07/06/2015   HCT 39.5  07/06/2015   PLT 342.0 07/06/2015   GLUCOSE 85 07/06/2015   CHOL 220* 07/06/2015   TRIG 70.0 07/06/2015   HDL 51.60 07/06/2015   LDLDIRECT 145.4 11/14/2012   LDLCALC 154* 07/06/2015   ALT 73* 07/06/2015   AST 40* 07/06/2015   NA 140 07/06/2015   K 4.6 07/06/2015   CL 106 07/06/2015   CREATININE 0.83 07/06/2015   BUN 20  07/06/2015   CO2 30 07/06/2015   TSH 1.56 07/06/2015   HGBA1C 5.9 07/06/2015   MICROALBUR <0.7 01/09/2015    Lab Results  Component Value Date   TSH 1.56 07/06/2015   Lab Results  Component Value Date   WBC 6.9 07/06/2015   HGB 13.4 07/06/2015   HCT 39.5 07/06/2015   MCV 88.7 07/06/2015   PLT 342.0 07/06/2015   Lab Results  Component Value Date   NA 140 07/06/2015   K 4.6 07/06/2015   CO2 30 07/06/2015   GLUCOSE 85 07/06/2015   BUN 20 07/06/2015   CREATININE 0.83 07/06/2015   BILITOT 0.5 07/06/2015   ALKPHOS 75 07/06/2015   AST 40* 07/06/2015   ALT 73* 07/06/2015   PROT 6.8 07/06/2015   ALBUMIN 3.9 07/06/2015   CALCIUM 9.8 07/06/2015   GFR 72.67 07/06/2015   Lab Results  Component Value Date   CHOL 220* 07/06/2015   Lab Results  Component Value Date   HDL 51.60 07/06/2015   Lab Results  Component Value Date   LDLCALC 154* 07/06/2015   Lab Results  Component Value Date   TRIG 70.0 07/06/2015   Lab Results  Component Value Date   CHOLHDL 4 07/06/2015   Lab Results  Component Value Date   HGBA1C 5.9 07/06/2015       Assessment & Plan:   Problem List Items Addressed This Visit    Abnormal liver function tests    With intermittent nausea and vomiting and intermittent abdominal pain      Hyperlipidemia, mixed    Tolerating statin, encouraged heart healthy diet, avoid trans fats, minimize simple carbs and saturated fats. Increase exercise as tolerated      Relevant Orders   Lipid panel   INCREASED BLOOD PRESSURE    Well controlled. Encouraged heart healthy diet such as the DASH diet and exercise as tolerated.        Preventative health care    Patient encouraged to maintain heart healthy diet, regular exercise, adequate sleep. Consider daily probiotics. Take medications as prescribed  referred Pharmacy and which med where: [place], [place]: Pt also uses other [facility] pharmacies. Verify pharmacy w/ pt before sending rx.  Allergies verified: Yes  Immunization Status: Flu vaccine: 12/30/14 Tdap: 06/02/12 PNA: [id number] 01/29/15 Shingles: 10/05/11  A/P:  Changes to FH, PSH or Personal Hx: None Pap: N/A MMG: 02/05/15 Bi-rads Category 1: Negative Bone Density: 07/22/14 Osteopenia  CCS: 04/05/07 Historical   Care Teams Updated: Regina Eck, CNM-OB/GYN Dr. Tarri Fuller Young-Pulmonology  Dr. Delma Officer Dr. Curt Bears Hecker-Opthalmology  Dr. Juanda Crumble Fields-Vascular Surgery Marica Otter, OD-Optometry Milus Glazier, DC-Chiropractor   ED/Hospital/Urgent Care Visits: None  To Discuss with Provider: Pt stopped taking Cymbalta 3 weeks ago. She self tapered dose. She is taking multiple herbal supplements that she will bring to appt. She is concerned about her elevated LFTs.      Relevant Orders   Comprehensive metabolic panel   Type 2 diabetes mellitus with hyperlipidemia ([place]) - Primary    hgba1c acceptable, minimize simple carbs. Increase exercise as tolerated. Continue current meds       Other Visit Diagnoses    Encounter for screening colonoscopy        Relevant Orders    Ambulatory referral to Gastroenterology    Elevated liver function tests        Relevant Orders    US Abdomen Complete (Completed)  Comprehensive metabolic panel    Nausea without vomiting        Relevant Orders    US Abdomen Complete (Completed)       I have discontinued Ms. Derringer's DULoxetine and OVER THE COUNTER MEDICATION. I am also having her maintain her cycloSPORINE, ipratropium, levocetirizine, B Complex Vitamins (VITAMIN B COMPLEX PO),  pravastatin, and MAGNESIUM CITRATE PO. Denies CP/palp/SOB/HA/congestion/fevers/GI or GU c/o. Taking meds as prescribed   Meds ordered this encounter  Medications  . MAGNESIUM CITRATE PO    Sig: Take by mouth at bedtime.     Penni Homans, MD

## 2015-07-10 NOTE — Assessment & Plan Note (Signed)
Well controlled. Encouraged heart healthy diet such as the DASH diet and exercise as tolerated.  

## 2015-07-10 NOTE — Assessment & Plan Note (Signed)
hgba1c acceptable, minimize simple carbs. Increase exercise as tolerated. Continue current meds 

## 2015-07-14 ENCOUNTER — Ambulatory Visit (HOSPITAL_BASED_OUTPATIENT_CLINIC_OR_DEPARTMENT_OTHER)
Admission: RE | Admit: 2015-07-14 | Discharge: 2015-07-14 | Disposition: A | Discharge status: Home / Self Care | Payer: Medicare HMO | Source: Ambulatory Visit | Attending: Family Medicine | Admitting: Family Medicine

## 2015-07-14 DIAGNOSIS — R945 Abnormal results of liver function studies: Principal | ICD-10-CM

## 2015-07-14 DIAGNOSIS — R7989 Other specified abnormal findings of blood chemistry: Secondary | ICD-10-CM

## 2015-07-14 DIAGNOSIS — R110 Nausea: Secondary | ICD-10-CM

## 2015-07-16 ENCOUNTER — Ambulatory Visit (HOSPITAL_BASED_OUTPATIENT_CLINIC_OR_DEPARTMENT_OTHER)
Admission: RE | Admit: 2015-07-16 | Discharge: 2015-07-16 | Disposition: A | Discharge status: Home / Self Care | Payer: Medicare HMO | Source: Ambulatory Visit | Attending: Family Medicine | Admitting: Family Medicine

## 2015-07-16 DIAGNOSIS — R7989 Other specified abnormal findings of blood chemistry: Secondary | ICD-10-CM | POA: Diagnosis not present

## 2015-07-16 DIAGNOSIS — R112 Nausea with vomiting, unspecified: Secondary | ICD-10-CM | POA: Diagnosis not present

## 2015-07-16 DIAGNOSIS — R110 Nausea: Secondary | ICD-10-CM | POA: Diagnosis not present

## 2015-07-19 ENCOUNTER — Encounter: Payer: Self-pay | Admitting: Family Medicine

## 2015-07-19 DIAGNOSIS — R7989 Other specified abnormal findings of blood chemistry: Secondary | ICD-10-CM

## 2015-07-19 DIAGNOSIS — R945 Abnormal results of liver function studies: Secondary | ICD-10-CM | POA: Insufficient documentation

## 2015-07-19 HISTORY — DX: Abnormal results of liver function studies: R94.5

## 2015-07-19 HISTORY — DX: Other specified abnormal findings of blood chemistry: R79.89

## 2015-07-19 NOTE — Assessment & Plan Note (Signed)
With intermittent nausea and vomiting and intermittent abdominal pain

## 2015-08-10 ENCOUNTER — Telehealth: Payer: Self-pay | Admitting: Family Medicine

## 2015-08-10 NOTE — Telephone Encounter (Signed)
Advise on patient request.

## 2015-08-10 NOTE — Telephone Encounter (Signed)
OK to refill Cymbalta 30 mg tabs, 1 tab po daily disp #30 with 3 rf

## 2015-08-10 NOTE — Telephone Encounter (Signed)
Caller name: Lluliana Relationship to patient: self Can be reached: [phone] Pharmacy: [id number] - [place], [place]  Reason for call: Pt out of state visiting sister. She is having muscle aches again and wanting to go back on the Cymbalta. Please send in RX.

## 2015-08-11 MED ORDER — DULOXETINE HCL 30 MG PO CPEP: 30.0000 mg | ORAL_CAPSULE | Freq: Every day | ORAL | Status: DC

## 2015-08-11 NOTE — Telephone Encounter (Signed)
Sent in cymbalta as Dr. Charlett Blake instructed. Called the patient to inform requested prescription has been sent to pharmacy requested

## 2015-10-13 ENCOUNTER — Other Ambulatory Visit: Payer: Self-pay | Admitting: Family Medicine

## 2015-11-10 DIAGNOSIS — L821 Other seborrheic keratosis: Secondary | ICD-10-CM | POA: Diagnosis not present

## 2015-11-12 ENCOUNTER — Telehealth: Payer: Self-pay | Admitting: Internal Medicine

## 2015-11-12 NOTE — Telephone Encounter (Signed)
Spoke with pt. She has been scheduled with TP on 12/08/15 at 11:15am. Nothing further was needed.

## 2015-11-12 NOTE — Telephone Encounter (Signed)
CY Please advise if okay for pt to see NP. Thanks.

## 2015-11-12 NOTE — Telephone Encounter (Signed)
Ok

## 2015-11-12 NOTE — Telephone Encounter (Signed)
Spoke with pt and she is out of town until second week in Sept. Looked at schedule and cannot find anything in this year. She would like to know if she can be worked in somewhere.   Katie - Please advise. Thanks!

## 2015-11-16 DIAGNOSIS — H5203 Hypermetropia, bilateral: Secondary | ICD-10-CM | POA: Diagnosis not present

## 2015-11-16 DIAGNOSIS — H04123 Dry eye syndrome of bilateral lacrimal glands: Secondary | ICD-10-CM | POA: Diagnosis not present

## 2015-11-16 DIAGNOSIS — H524 Presbyopia: Secondary | ICD-10-CM | POA: Diagnosis not present

## 2015-11-16 DIAGNOSIS — H52223 Regular astigmatism, bilateral: Secondary | ICD-10-CM | POA: Diagnosis not present

## 2015-11-27 ENCOUNTER — Ambulatory Visit: Payer: Medicare HMO | Admitting: Internal Medicine

## 2015-12-08 ENCOUNTER — Ambulatory Visit (INDEPENDENT_AMBULATORY_CARE_PROVIDER_SITE_OTHER): Payer: Medicare HMO | Admitting: Adult Health

## 2015-12-08 ENCOUNTER — Encounter: Payer: Self-pay | Admitting: Adult Health

## 2015-12-08 VITALS — BP 132/78 | HR 65 | Temp 98.0°F | Ht 66.0 in | Wt 174.0 lb

## 2015-12-08 DIAGNOSIS — G4733 Obstructive sleep apnea (adult) (pediatric): Secondary | ICD-10-CM | POA: Diagnosis not present

## 2015-12-08 DIAGNOSIS — Z23 Encounter for immunization: Secondary | ICD-10-CM

## 2015-12-08 NOTE — Patient Instructions (Signed)
Continue on CPAP At bedtime   Do not drive if sleepy  Keep up good work .  Flu shot today  follow up Dr. Annamaria Boots  In 1 year  And As needed

## 2015-12-08 NOTE — Progress Notes (Signed)
Subjective:    Patient ID: Cindy [name], female    DOB: [birth year], 70 y.o.   MRN: [id number]  HPI  70 year old female followed for obstructive sleep apnea  12/08/2015 Follow up : OSA  Patient returns for a one-year follow-up for sleep apnea. Patient remains on C Pap at bedtime. Says he is doing well on C Pap. Download shows excellent compliance with average usage at 9 hours. Denies any significant daytime sleepiness.  Past Medical History:  Diagnosis Date  . Abnormal liver function tests 07/19/2015  . Allergic state 02/01/2010   Qualifier: Diagnosis of  By: Regis Bill MD, Standley Brooking   . Allergy   . Anxiety   . Back pain   . Chicken pox 70 yrs old  . Depressive disorder, not elsewhere classified   . Diabetes ([place])   . Fibroid   . History of fractured kneecap    right   . Hyperglycemia   . Knee pain, left 10/05/2011   Follows with Dr Margarette Canada  . Measles as a child  . Medicare annual wellness visit, subsequent 07/06/2014  . OA (osteoarthritis) of knee    left knee  . Obesity 10/05/2011  . Osteoarthritis   . Osteopenia 06/26/2014  . Other and unspecified hyperlipidemia   . Overweight 10/05/2011  . Overweight(278.02) 10/05/2011  . Sleep apnea     On CPAP  . Sleep apnea   . Stye 11/19/2012  . Thyroid disease    h/o hyperthyroid treated with radioactive iodine? from 16 to 20  . Type 2 diabetes mellitus with hyperlipidemia ([place])   . Varicose vein of leg    Current Outpatient Prescriptions on File Prior to Visit  Medication Sig Dispense Refill  . cycloSPORINE (RESTASIS) 0.05 % ophthalmic emulsion Place 1 drop into both eyes daily as needed. Reported on 07/09/2015    . DULoxetine (CYMBALTA) 30 MG capsule TAKE 1 CAPSULE BY MOUTH EVERY DAY 90 capsule 2  . ipratropium (ATROVENT) 0.06 % nasal spray Place 2 sprays into both nostrils 4 (four) times daily. For nasal congestion and post nasal drainage (Patient taking differently: Place 2 sprays into both nostrils as needed. For nasal congestion and  post nasal drainage) 15 mL 12  . levocetirizine (XYZAL) 5 MG tablet Take 5 mg by mouth as needed.   3  . MAGNESIUM CITRATE PO Take by mouth at bedtime.    . pravastatin (PRAVACHOL) 80 MG tablet TAKE ONE BY MOUTH ONCE DAILY (Patient taking differently: 40 mg. TAKE ONE BY MOUTH ONCE DAILY) 90 tablet 1  . B Complex Vitamins (VITAMIN B COMPLEX PO) Take by mouth daily.     No current facility-administered medications on file prior to visit.       Review of Systems Constitutional:   No  weight loss, night sweats,  Fevers, chills, fatigue, or  lassitude.  HEENT:   No headaches,  Difficulty swallowing,  Tooth/dental problems, or  Sore throat,                No sneezing, itching, ear ache, nasal congestion, post nasal drip,   CV:  No chest pain,  Orthopnea, PND, swelling in lower extremities, anasarca, dizziness, palpitations, syncope.   GI  No heartburn, indigestion, abdominal pain, nausea, vomiting, diarrhea, change in bowel habits, loss of appetite, bloody stools.   Resp: No shortness of breath with exertion or at rest.  No excess mucus, no productive cough,  No non-productive cough,  No coughing up of blood.  No change in color  of mucus.  No wheezing.  No chest wall deformity  Skin: no rash or lesions.  GU: no dysuria, change in color of urine, no urgency or frequency.  No flank pain, no hematuria   MS:  No joint pain or swelling.  No decreased range of motion.  No back pain.  Psych:  No change in mood or affect. No depression or anxiety.  No memory loss.         Objective:   Physical Exam Vitals:   12/08/15 1127  BP: 132/78  Pulse: 65  Temp: 98 F (36.7 C)  TempSrc: Oral  SpO2: 95%  Weight: 174 lb (78.9 kg)  Height: 5\' 6"  (1.676 m)  Body mass index is 28.08 kg/m.   GEN: A/Ox3; pleasant , NAD, well nourished    HEENT:  [place]/AT,  EACs-clear, TMs-wnl, NOSE-clear, THROAT-clear, no lesions, no postnasal drip or exudate noted. Class 2-3 MP airway   NECK:  Supple w/ fair  ROM; no JVD; normal carotid impulses w/o bruits; no thyromegaly or nodules palpated; no lymphadenopathy.    RESP  Clear  P & A; w/o, wheezes/ rales/ or rhonchi. no accessory muscle use, no dullness to percussion  CARD:  RRR, no m/r/g  , no peripheral edema, pulses intact, no cyanosis or clubbing.  GI:   Soft & nt; nml bowel sounds; no organomegaly or masses detected.   Musco: Warm bil, no deformities or joint swelling noted.   Neuro: alert, no focal deficits noted.    Skin: Warm, no lesions or rashes  Cindy Doane NP-C  [facility]  12/08/2015        Assessment & Plan:

## 2015-12-08 NOTE — Assessment & Plan Note (Signed)
Controlled   Plan  Patient Instructions  Continue on CPAP At bedtime   Do not drive if sleepy  Keep up good work .  Flu shot today  follow up Dr. Annamaria Boots  In 1 year  And As needed

## 2015-12-15 ENCOUNTER — Encounter: Payer: Self-pay | Admitting: Gastroenterology

## 2015-12-28 ENCOUNTER — Other Ambulatory Visit: Payer: Self-pay | Admitting: Family Medicine

## 2015-12-28 NOTE — Telephone Encounter (Signed)
Patient needs refills of this medication but would like to know if you would like her to continue to cut the pill in half or what should she be doing? pravastatin (PRAVACHOL) 80 MG tablet   Pharmacy: [place], [place]  Patient Relation:Self Patient Phone: [phone]

## 2015-12-29 NOTE — Telephone Encounter (Signed)
Keep taking 1/2 tab til seen at next visit then we will retest and change dose then if need be

## 2015-12-30 MED ORDER — PRAVASTATIN SODIUM 80 MG PO TABS: 40.0000 mg | ORAL_TABLET | Freq: Every day | ORAL | 0 refills | Status: DC

## 2015-12-30 MED FILL — PRAVASTATIN SODIUM 80 MG TA: 80 | 90 days supply | Qty: 45 | Fill #0 | Status: TO

## 2015-12-30 NOTE — Telephone Encounter (Signed)
Medication filled to pharmacy as requested and pt notified of instructions.

## 2015-12-31 ENCOUNTER — Encounter: Payer: Self-pay | Admitting: Gastroenterology

## 2015-12-31 ENCOUNTER — Telehealth: Payer: Self-pay | Admitting: Adult Health

## 2015-12-31 NOTE — Telephone Encounter (Signed)
Pt states she has not yet heard from [place] regarding her cpap mask/headgear.  Pt has not yet called [facility]. Order placed on 9/5 for supplies. Called [facility] and spoke with Tiffany, states she will forward this message to the RT's and will call back to follow up.  Will await call back.

## 2016-01-01 ENCOUNTER — Encounter: Payer: Self-pay | Admitting: Certified Nurse Midwife

## 2016-01-01 ENCOUNTER — Ambulatory Visit (INDEPENDENT_AMBULATORY_CARE_PROVIDER_SITE_OTHER): Payer: Medicare HMO | Admitting: Certified Nurse Midwife

## 2016-01-01 VITALS — BP 102/60 | HR 68 | Resp 16 | Ht 64.75 in | Wt 171.0 lb

## 2016-01-01 DIAGNOSIS — S90562A Insect bite (nonvenomous), left ankle, initial encounter: Secondary | ICD-10-CM

## 2016-01-01 DIAGNOSIS — Z01419 Encounter for gynecological examination (general) (routine) without abnormal findings: Secondary | ICD-10-CM | POA: Diagnosis not present

## 2016-01-01 DIAGNOSIS — W57XXXA Bitten or stung by nonvenomous insect and other nonvenomous arthropods, initial encounter: Secondary | ICD-10-CM

## 2016-01-01 DIAGNOSIS — N952 Postmenopausal atrophic vaginitis: Secondary | ICD-10-CM

## 2016-01-01 NOTE — Progress Notes (Signed)
70 y.o. G24P1011 Divorced  Caucasian Fe here for annual exam. Menopausal no HRT. Denies vaginal bleeding. Some vaginal dryness. [place] PCP for cholesterol management/anxiety/labs/aex. All stable now.Not sexually active. Had ? Bite on left ankle area, some redness not tender.Retired this spring and has been busy with family and travel all summer. Now plans to catch up on eating better and weight loss. No other health concerns today.  Patient's last menstrual period was 04/04/2000.          Sexually active: No.  The current method of family planning is post menopausal status.    Exercising: Yes.    curves, walking,bike Smoker:  no  Health Maintenance: Pap:  12-26-14 neg hx of Leep yrs ago MMG:  02-06-15 category b density birads 1:neg Colonoscopy:  2007 with pcp, has next one scheduled 03/01/16 BMD:   2016 osteopenia TDaP:  2014 Shingles: 2013 Pneumonia: 2016 Hep C and HIV: Hep c neg 2017, HIV done many yrs ago Labs: pcp Self breast exam: done occ   reports that she has never smoked. She has never used smokeless tobacco. She reports that she drinks about 0.6 oz of alcohol per week . She reports that she does not use drugs.  Past Medical History:  Diagnosis Date  . Abnormal liver function tests 07/19/2015  . Allergic state 02/01/2010   Qualifier: Diagnosis of  By: Regis Bill MD, Standley Brooking   . Allergy   . Anxiety   . Back pain   . Chicken pox 70 yrs old  . Depressive disorder, not elsewhere classified   . Diabetes ([place])   . Fibroid   . History of fractured kneecap    right   . Hyperglycemia   . Knee pain, left 10/05/2011   Follows with Dr Margarette Canada  . Measles as a child  . Medicare annual wellness visit, subsequent 07/06/2014  . OA (osteoarthritis) of knee    left knee  . Obesity 10/05/2011  . Osteoarthritis   . Osteopenia 06/26/2014  . Other and unspecified hyperlipidemia   . Overweight 10/05/2011  . Overweight(278.02) 10/05/2011  . Sleep apnea     On CPAP  . Sleep apnea   .  Stye 11/19/2012  . Thyroid disease    h/o hyperthyroid treated with radioactive iodine? from 16 to 20  . Type 2 diabetes mellitus with hyperlipidemia ([place])   . Varicose vein of leg     Past Surgical History:  Procedure Laterality Date  . BACK SURGERY     L5 discectomy, 2002. helpful  . CESAREAN SECTION  1987  . CPAP    . KNEE SURGERY Right 2006   R knee fx. patella  . LEEP      Current Outpatient Prescriptions  Medication Sig Dispense Refill  . cycloSPORINE (RESTASIS) 0.05 % ophthalmic emulsion Place 1 drop into both eyes daily as needed. Reported on 07/09/2015    . DULoxetine (CYMBALTA) 30 MG capsule TAKE 1 CAPSULE BY MOUTH EVERY DAY 90 capsule 2  . ipratropium (ATROVENT) 0.06 % nasal spray Place 2 sprays into both nostrils 4 (four) times daily. For nasal congestion and post nasal drainage (Patient taking differently: Place 2 sprays into both nostrils as needed. For nasal congestion and post nasal drainage) 15 mL 12  . levocetirizine (XYZAL) 5 MG tablet Take 5 mg by mouth as needed.   3  . pravastatin (PRAVACHOL) 80 MG tablet Take 0.5 tablets (40 mg total) by mouth daily. 90 tablet 0   No current facility-administered medications  for this visit.     Family History  Problem Relation Age of Onset  . Hyperlipidemia Mother   . Dementia Mother   . Hyperlipidemia Father   . Heart disease Father     bypass, CAD  . Hyperlipidemia Sister   . Diabetes Sister     type 2  . Dementia Maternal Grandmother     alzheimer  . Alcohol abuse Maternal Grandfather   . Diabetes Paternal Grandmother     type 2?    ROS:  Pertinent items are noted in HPI.  Otherwise, a comprehensive ROS was negative.  Exam:   BP 102/60   Pulse 68   Resp 16   Ht 5' 4.75" (1.645 m)   Wt 171 lb (77.6 kg)   LMP 04/04/2000   BMI 28.68 kg/m  Height: 5' 4.75" (164.5 cm) Ht Readings from Last 3 Encounters:  01/01/16 5' 4.75" (1.645 m)  12/08/15 5\' 6"  (1.676 m)  07/10/15 5\' 6"  (1.676 m)    General  appearance: alert, cooperative and appears stated age Head: Normocephalic, without obvious abnormality, atraumatic Neck: no adenopathy, supple, symmetrical, trachea midline and thyroid normal to inspection and palpation Lungs: clear to auscultation bilaterally Breasts: normal appearance, no masses or tenderness, No nipple retraction or dimpling, No nipple discharge or bleeding, No axillary or supraclavicular adenopathy Heart: regular rate and rhythm Abdomen: soft, non-tender; no masses,  no organomegaly Extremities: extremities normal, atraumatic, no cyanosis or edema. Left ankle 3 cm size round area slightly increase pink noted . No break in skin noted or streaking. Non tender to touch. Skin: Skin color, texture, turgor normal. No rashes or lesions Lymph nodes: Cervical, supraclavicular, and axillary nodes normal. No abnormal inguinal nodes palpated Neurologic: Grossly normal   Pelvic: External genitalia:  no lesions              Urethra:  normal appearing urethra with no masses, tenderness or lesions              Bartholin's and Skene's: normal                 Vagina: normal appearing vagina with normal color and discharge, no lesions              Cervix: multiparous appearance, no cervical motion tenderness and no lesions              Pap taken: No. Bimanual Exam:  Uterus:  normal size, contour, position, consistency, mobility, non-tender limited by body habitus              Adnexa: normal adnexa and no mass, fullness, tenderness, limited by body habitus               Rectovaginal: Confirms               Anus:  normal sphincter tone, no lesions  Chaperone present: yes  A:  Well Woman with normal exam  Menopausal no HRT  Atrophic Vaginitis  ? Insect bite on left ankle   History of LEEP for abnormal pap smear 2006  Cholesterol/anxiety management with PCP  P:   Reviewed health and wellness pertinent to exam  Aware of need to evaluate if vaginal bleeding  Discussed trial of coconut  oil to help with dryness, since Olive oil is not working well. Discussed increase of UTI and vaginal infection if having dryness. Instructions given for use. Patient will advise if concerns.  Discussed with inflammation occurrence with insect bite. Patient will do  epsom salt soaks and then apply bacitracin ointment to area. Warning signs reviewed and will need to be seen by PCP or urgent care if needed.  Continue follow up with PCP as indicated  Pap smear as above not taken   counseled on breast self exam, mammography screening, adequate intake of calcium and vitamin D, diet and exercise  return annually or prn  An After Visit Summary was printed and given to the patient.

## 2016-01-01 NOTE — Patient Instructions (Signed)
EXERCISE AND DIET:  We recommended that you start or continue a regular exercise program for good health. Regular exercise means any activity that makes your heart beat faster and makes you sweat.  We recommend exercising at least 30 minutes per day at least 3 days a week, preferably 4 or 5.  We also recommend a diet low in fat and sugar.  Inactivity, poor dietary choices and obesity can cause diabetes, heart attack, stroke, and kidney damage, among others.    ALCOHOL AND SMOKING:  Women should limit their alcohol intake to no more than 7 drinks/beers/glasses of wine (combined, not each!) per week. Moderation of alcohol intake to this level decreases your risk of breast cancer and liver damage. And of course, no recreational drugs are part of a healthy lifestyle.  And absolutely no smoking or even second hand smoke. Most people know smoking can cause heart and lung diseases, but did you know it also contributes to weakening of your bones? Aging of your skin?  Yellowing of your teeth and nails?  CALCIUM AND VITAMIN D:  Adequate intake of calcium and Vitamin D are recommended.  The recommendations for exact amounts of these supplements seem to change often, but generally speaking 600 mg of calcium (either carbonate or citrate) and 800 units of Vitamin D per day seems prudent. Certain women may benefit from higher intake of Vitamin D.  If you are among these women, your doctor will have told you during your visit.    PAP SMEARS:  Pap smears, to check for cervical cancer or precancers,  have traditionally been done yearly, although recent scientific advances have shown that most women can have pap smears less often.  However, every woman still should have a physical exam from her gynecologist every year. It will include a breast check, inspection of the vulva and vagina to check for abnormal growths or skin changes, a visual exam of the cervix, and then an exam to evaluate the size and shape of the uterus and  ovaries.  And after 68 years of age, a rectal exam is indicated to check for rectal cancers. We will also provide age appropriate advice regarding health maintenance, like when you should have certain vaccines, screening for sexually transmitted diseases, bone density testing, colonoscopy, mammograms, etc.   MAMMOGRAMS:  All women over 40 years old should have a yearly mammogram. Many facilities now offer a "3D" mammogram, which may cost around $50 extra out of pocket. If possible,  we recommend you accept the option to have the 3D mammogram performed.  It both reduces the number of women who will be called back for extra views which then turn out to be normal, and it is better than the routine mammogram at detecting truly abnormal areas.    COLONOSCOPY:  Colonoscopy to screen for colon cancer is recommended for all women at age 50.  We know, you hate the idea of the prep.  We agree, BUT, having colon cancer and not knowing it is worse!!  Colon cancer so often starts as a polyp that can be seen and removed at colonscopy, which can quite literally save your life!  And if your first colonoscopy is normal and you have no family history of colon cancer, most women don't have to have it again for 10 years.  Once every ten years, you can do something that may end up saving your life, right?  We will be happy to help you get it scheduled when you are ready.    Be sure to check your insurance coverage so you understand how much it will cost.  It may be covered as a preventative service at no cost, but you should check your particular policy.      Cellulitis Cellulitis is an infection of the skin and the tissue beneath it. The infected area is usually red and tender. Cellulitis occurs most often in the arms and lower legs.  CAUSES  Cellulitis is caused by bacteria that enter the skin through cracks or cuts in the skin. The most common types of bacteria that cause cellulitis are staphylococci and streptococci. SIGNS  AND SYMPTOMS   Redness and warmth.  Swelling.  Tenderness or pain.  Fever. DIAGNOSIS  Your health care provider can usually determine what is wrong based on a physical exam. Blood tests may also be done. TREATMENT  Treatment usually involves taking an antibiotic medicine. HOME CARE INSTRUCTIONS   Take your antibiotic medicine as directed by your health care provider. Finish the antibiotic even if you start to feel better.  Keep the infected arm or leg elevated to reduce swelling.  Apply a warm cloth to the affected area up to 4 times per day to relieve pain.  Take medicines only as directed by your health care provider.  Keep all follow-up visits as directed by your health care provider. SEEK MEDICAL CARE IF:   You notice red streaks coming from the infected area.  Your red area gets larger or turns dark in color.  Your bone or joint underneath the infected area becomes painful after the skin has healed.  Your infection returns in the same area or another area.  You notice a swollen bump in the infected area.  You develop new symptoms.  You have a fever. SEEK IMMEDIATE MEDICAL CARE IF:   You feel very sleepy.  You develop vomiting or diarrhea.  You have a general ill feeling (malaise) with muscle aches and pains.   This information is not intended to replace advice given to you by your health care provider. Make sure you discuss any questions you have with your health care provider.   Document Released: 12/29/2004 Document Revised: 12/10/2014 Document Reviewed: 06/06/2011 Elsevier Interactive Patient Education [employer].

## 2016-01-01 NOTE — Telephone Encounter (Signed)
Called spoke with Estill Bamberg at [facility]. She states that she is faxing over a letter of medical necessity for CY to sign due to her account being blocked. She states that the RT will be contacting the patient to get her supplies to her. Will await for letter of medical necessity.

## 2016-01-01 NOTE — Progress Notes (Signed)
Encounter reviewed Taniaya Rudder, MD   

## 2016-01-04 ENCOUNTER — Other Ambulatory Visit: Payer: Self-pay | Admitting: Certified Nurse Midwife

## 2016-01-04 DIAGNOSIS — Z1231 Encounter for screening mammogram for malignant neoplasm of breast: Secondary | ICD-10-CM

## 2016-01-04 DIAGNOSIS — G4733 Obstructive sleep apnea (adult) (pediatric): Secondary | ICD-10-CM | POA: Diagnosis not present

## 2016-01-04 NOTE — Telephone Encounter (Signed)
Joellen Jersey can you please advise if this form has been received? Thanks.

## 2016-01-04 NOTE — Telephone Encounter (Signed)
Rodena Piety please advise if you have a CMN on this patient. Thanks!

## 2016-01-04 NOTE — Telephone Encounter (Signed)
I have not seen any forms on patient come through; please check with Cindy [name]. Thanks.

## 2016-01-05 ENCOUNTER — Encounter: Payer: Self-pay | Admitting: Physician Assistant

## 2016-01-05 ENCOUNTER — Ambulatory Visit (INDEPENDENT_AMBULATORY_CARE_PROVIDER_SITE_OTHER): Payer: Medicare HMO | Admitting: Physician Assistant

## 2016-01-05 VITALS — BP 106/49 | HR 65 | Temp 98.1°F | Resp 16 | Ht 64.75 in | Wt 173.5 lb

## 2016-01-05 DIAGNOSIS — L539 Erythematous condition, unspecified: Secondary | ICD-10-CM

## 2016-01-05 MED ORDER — TRIAMCINOLONE ACETONIDE 0.1 % EX CREA: 1.0000 "application " | TOPICAL_CREAM | Freq: Two times a day (BID) | CUTANEOUS | 0 refills | Status: DC

## 2016-01-05 NOTE — Progress Notes (Signed)
Patient presents to clinic today c/o red patch of Right lower leg just superior to ankle x 1.5 weeks. Denies pain, tenderness or itching. Denies known trauma or injury. Denies known bug/animal bite. Endorses having her GYN assess at onset of symptoms and epsom salt soaks were recommended. Has noted significant improvement since that time. Denies fever, chills, malaise or fatigue.   Past Medical History:  Diagnosis Date  . Abnormal liver function tests 07/19/2015  . Allergic state 02/01/2010   Qualifier: Diagnosis of  By: Regis Bill MD, Standley Brooking   . Allergy   . Anxiety   . Back pain   . Chicken pox 70 yrs old  . Depressive disorder, not elsewhere classified   . Diabetes ([place])   . Fibroid   . History of fractured kneecap    right   . Hyperglycemia   . Knee pain, left 10/05/2011   Follows with Dr Margarette Canada  . Measles as a child  . Medicare annual wellness visit, subsequent 07/06/2014  . OA (osteoarthritis) of knee    left knee  . Obesity 10/05/2011  . Osteoarthritis   . Osteopenia 06/26/2014  . Other and unspecified hyperlipidemia   . Overweight 10/05/2011  . Overweight(278.02) 10/05/2011  . Sleep apnea     On CPAP  . Sleep apnea   . Stye 11/19/2012  . Thyroid disease    h/o hyperthyroid treated with radioactive iodine? from 16 to 20  . Type 2 diabetes mellitus with hyperlipidemia ([place])   . Varicose vein of leg     Current Outpatient Prescriptions on File Prior to Visit  Medication Sig Dispense Refill  . cycloSPORINE (RESTASIS) 0.05 % ophthalmic emulsion Place 1 drop into both eyes daily as needed. Reported on 07/09/2015    . DULoxetine (CYMBALTA) 30 MG capsule TAKE 1 CAPSULE BY MOUTH EVERY DAY 90 capsule 2  . ipratropium (ATROVENT) 0.06 % nasal spray Place 2 sprays into both nostrils 4 (four) times daily. For nasal congestion and post nasal drainage (Patient taking differently: Place 2 sprays into both nostrils as needed. For nasal congestion and post nasal drainage) 15 mL 12  .  levocetirizine (XYZAL) 5 MG tablet Take 5 mg by mouth as needed.   3  . pravastatin (PRAVACHOL) 80 MG tablet Take 0.5 tablets (40 mg total) by mouth daily. 90 tablet 0   No current facility-administered medications on file prior to visit.     Allergies  Allergen Reactions  . Codeine Other (See Comments)    hallucinations  . Crestor [Rosuvastatin]     REACTION: myalgias  . Amoxicillin Rash  . Erythromycin Other (See Comments)    cramps  . Sulfonamide Derivatives Rash    Family History  Problem Relation Age of Onset  . Hyperlipidemia Mother   . Dementia Mother   . Hyperlipidemia Father   . Heart disease Father     bypass, CAD  . Hyperlipidemia Sister   . Diabetes Sister     type 2  . Dementia Maternal Grandmother     alzheimer  . Alcohol abuse Maternal Grandfather   . Diabetes Paternal Grandmother     type 2?    Social History   Social History  . Marital status: Divorced    Spouse name: N/A  . Number of children: N/A  . Years of education: N/A   Social History Main Topics  . Smoking status: Never Smoker  . Smokeless tobacco: Never Used  . Alcohol use 0.6 oz/week    1  Glasses of wine per week  . Drug use: No  . Sexual activity: No   Other Topics Concern  . None   Social History Narrative   Recently divorced separated fall 2012    Now living with friend in [place] area .   Never smoker   Regular exercise   Sleep 01/12/29 up at 6; job hours irreg              Review of Systems - See HPI.  All other ROS are negative.  BP (!) 106/49 (BP Location: Left Arm, Patient Position: Sitting, Cuff Size: Large)   Pulse 65   Temp 98.1 F (36.7 C) (Oral)   Resp 16   Ht 5' 4.75" (1.645 m)   Wt 173 lb 8 oz (78.7 kg)   LMP 04/04/2000   SpO2 100%   BMI 29.10 kg/m   Physical Exam  Constitutional: She is well-developed, well-nourished, and in no distress.  Eyes: Conjunctivae are normal.  Neck: Neck supple.  Cardiovascular: Normal rate, regular rhythm, normal  heart sounds and intact distal pulses.   Pulmonary/Chest: Effort normal and breath sounds normal. No respiratory distress. She has no wheezes. She has no rales. She exhibits no tenderness.  Skin: Skin is warm and dry.     Psychiatric: Affect normal.  Vitals reviewed.   No results found for this or any previous visit (from the past 2160 hour(s)).  Assessment/Plan: 1. Erythema of skin Very slight. No finite lesion. Asymptomatic but resolving per patient. Will continue soaks. Rx Kenalog to apply to the area. If not continuing to resolve over the next 4-5 days or any worsening, recommend Dermatology assessment.     Leeanne Rio, PA-C

## 2016-01-05 NOTE — Patient Instructions (Signed)
Please keep up with your Epsom soaks -- 10 minutes at a time. Continue Bacitracin. You can use the Kenalog as directed for redness. Symptoms should continue to resolve.  If your symptoms are not resolving in 4-5 days, please let me know.

## 2016-01-05 NOTE — Telephone Encounter (Signed)
I have a CMN form for a chinstrap in Dr. Janee Morn CMN folder

## 2016-01-05 NOTE — Progress Notes (Signed)
Pre visit review using our clinic review tool, if applicable. No additional management support is needed unless otherwise documented below in the visit note/SLS  

## 2016-01-06 NOTE — Telephone Encounter (Signed)
Dr. Annamaria Boots has signed this CMN and it has been faxed to [facility]

## 2016-01-08 DIAGNOSIS — L821 Other seborrheic keratosis: Secondary | ICD-10-CM | POA: Diagnosis not present

## 2016-01-08 DIAGNOSIS — Z86018 Personal history of other benign neoplasm: Secondary | ICD-10-CM | POA: Diagnosis not present

## 2016-01-08 DIAGNOSIS — D485 Neoplasm of uncertain behavior of skin: Secondary | ICD-10-CM | POA: Diagnosis not present

## 2016-01-08 DIAGNOSIS — Z23 Encounter for immunization: Secondary | ICD-10-CM | POA: Diagnosis not present

## 2016-01-08 DIAGNOSIS — D225 Melanocytic nevi of trunk: Secondary | ICD-10-CM | POA: Diagnosis not present

## 2016-01-08 DIAGNOSIS — D2261 Melanocytic nevi of right upper limb, including shoulder: Secondary | ICD-10-CM | POA: Diagnosis not present

## 2016-01-08 DIAGNOSIS — L814 Other melanin hyperpigmentation: Secondary | ICD-10-CM | POA: Diagnosis not present

## 2016-01-08 DIAGNOSIS — D2272 Melanocytic nevi of left lower limb, including hip: Secondary | ICD-10-CM | POA: Diagnosis not present

## 2016-02-04 ENCOUNTER — Ambulatory Visit: Payer: Medicare HMO | Admitting: Family Medicine

## 2016-02-08 ENCOUNTER — Ambulatory Visit: Payer: Medicare HMO

## 2016-02-11 ENCOUNTER — Other Ambulatory Visit: Payer: Self-pay

## 2016-02-11 ENCOUNTER — Ambulatory Visit (INDEPENDENT_AMBULATORY_CARE_PROVIDER_SITE_OTHER): Payer: Medicare HMO | Admitting: Family Medicine

## 2016-02-11 VITALS — BP 98/62 | HR 62 | Temp 98.4°F | Wt 175.2 lb

## 2016-02-11 DIAGNOSIS — R7989 Other specified abnormal findings of blood chemistry: Secondary | ICD-10-CM | POA: Diagnosis not present

## 2016-02-11 DIAGNOSIS — Z0000 Encounter for general adult medical examination without abnormal findings: Secondary | ICD-10-CM

## 2016-02-11 DIAGNOSIS — R2241 Localized swelling, mass and lump, right lower limb: Secondary | ICD-10-CM

## 2016-02-11 DIAGNOSIS — E782 Mixed hyperlipidemia: Secondary | ICD-10-CM | POA: Diagnosis not present

## 2016-02-11 DIAGNOSIS — R030 Elevated blood-pressure reading, without diagnosis of hypertension: Secondary | ICD-10-CM | POA: Diagnosis not present

## 2016-02-11 DIAGNOSIS — E785 Hyperlipidemia, unspecified: Secondary | ICD-10-CM

## 2016-02-11 DIAGNOSIS — E663 Overweight: Secondary | ICD-10-CM

## 2016-02-11 DIAGNOSIS — E1169 Type 2 diabetes mellitus with other specified complication: Secondary | ICD-10-CM

## 2016-02-11 DIAGNOSIS — R945 Abnormal results of liver function studies: Secondary | ICD-10-CM

## 2016-02-11 NOTE — Progress Notes (Signed)
Patient ID: Cindy [name], female   DOB: Feb 04, 1946, 70 y.o.   MRN: [id number]   Subjective:    Patient ID: Cindy [name], female    DOB: [birth year], 70 y.o.   MRN: [id number]  Chief Complaint  Patient presents with  . Mass    HPI Patient is in today for a right lower leg bump. Patient states the bump has been there a few months now. Patient states the bump only hurts if she presses on it too much. Pt says the bump is negative for any discharge, or discoloration. Pt also states she does not remember hitting her leg or any thing " I just woke up one morning with the bump down there". Denies CP/palp/SOB/HA/congestion/fevers/GI or GU c/o. Taking meds as prescribed. No injury precipitated lesion and it is improving but has been present for over a month. No discharge from lesion.   Past Medical History:  Diagnosis Date  . Abnormal liver function tests 07/19/2015  . Allergic state 02/01/2010   Qualifier: Diagnosis of  By: Regis Bill MD, Standley Brooking   . Allergy   . Anxiety   . Back pain   . Chicken pox 70 yrs old  . Depressive disorder, not elsewhere classified   . Diabetes ([place])    diet controlled- no medicines   . Fibroid   . History of fractured kneecap    right   . Hyperglycemia   . Knee pain, left 10/05/2011   Follows with Dr Margarette Canada  . Measles as a child  . Medicare annual wellness visit, subsequent 07/06/2014  . OA (osteoarthritis) of knee    left knee  . Obesity 10/05/2011  . Osteoarthritis   . Osteopenia 06/26/2014  . Other and unspecified hyperlipidemia   . Overweight 10/05/2011  . Overweight(278.02) 10/05/2011  . Sleep apnea     On CPAP  . Sleep apnea   . Stye 11/19/2012  . Thyroid disease    h/o hyperthyroid treated with radioactive iodine? from 16 to 20  . Type 2 diabetes mellitus with hyperlipidemia ([place])   . Varicose vein of leg     Past Surgical History:  Procedure Laterality Date  . BACK SURGERY     L5 discectomy, 2002. helpful  . CERVICAL BIOPSY  W/ LOOP ELECTRODE  EXCISION    . CESAREAN SECTION  1987  . COLONOSCOPY    . CPAP    . KNEE SURGERY Right 2006   R knee fx. patella  . LEEP      Family History  Problem Relation Age of Onset  . Hyperlipidemia Mother   . Dementia Mother   . Hyperlipidemia Father   . Heart disease Father     bypass, CAD  . Hyperlipidemia Sister   . Diabetes Sister     type 2  . Colon polyps Sister   . Dementia Maternal Grandmother     alzheimer  . Alcohol abuse Maternal Grandfather   . Diabetes Paternal Grandmother     type 2?  . Colon cancer Neg Hx   . Esophageal cancer Neg Hx   . Rectal cancer Neg Hx   . Stomach cancer Neg Hx     Social History   Social History  . Marital status: Divorced    Spouse name: N/A  . Number of children: N/A  . Years of education: N/A   Occupational History  . Not on file.   Social History Main Topics  . Smoking status: Never Smoker  . Smokeless tobacco: Never Used  .  Alcohol use 0.6 oz/week    1 Glasses of wine per week  . Drug use: No  . Sexual activity: No   Other Topics Concern  . Not on file   Social History Narrative   Recently divorced separated fall 2012    Now living with friend in [place] area .   Never smoker   Regular exercise   Sleep 01/12/29 up at 6; job hours irreg              Outpatient Medications Prior to Visit  Medication Sig Dispense Refill  . cycloSPORINE (RESTASIS) 0.05 % ophthalmic emulsion Place 1 drop into both eyes daily as needed. Reported on 07/09/2015    . DULoxetine (CYMBALTA) 30 MG capsule TAKE 1 CAPSULE BY MOUTH EVERY DAY 90 capsule 2  . ipratropium (ATROVENT) 0.06 % nasal spray Place 2 sprays into both nostrils 4 (four) times daily. For nasal congestion and post nasal drainage (Patient taking differently: Place 2 sprays into both nostrils as needed. For nasal congestion and post nasal drainage) 15 mL 12  . levocetirizine (XYZAL) 5 MG tablet Take 5 mg by mouth as needed.   3  . pravastatin (PRAVACHOL) 80 MG tablet Take 0.5  tablets (40 mg total) by mouth daily. 90 tablet 0  . triamcinolone cream (KENALOG) 0.1 % Apply 1 application topically 2 (two) times daily. 30 g 0   No facility-administered medications prior to visit.     Allergies  Allergen Reactions  . Codeine Other (See Comments)    hallucinations  . Crestor [Rosuvastatin]     REACTION: myalgias  . Other Other (See Comments)    NOVACAINE- blisters left side of mouth after receiving   . Amoxicillin Rash  . Erythromycin Other (See Comments)    cramps  . Sulfonamide Derivatives Rash    Review of Systems  Constitutional: Negative for chills and fever.  Eyes: Negative for blurred vision.  Respiratory: Negative for cough and shortness of breath.   Cardiovascular: Negative for chest pain and palpitations.  Gastrointestinal: Negative for vomiting.  Musculoskeletal: Negative for back pain and joint pain.  Skin: Negative for rash.  Neurological: Negative for loss of consciousness and headaches.  Endo/Heme/Allergies: Does not bruise/bleed easily.  Psychiatric/Behavioral: Negative for depression.       Objective:    Physical Exam  Constitutional: She is oriented to person, place, and time. She appears well-developed and well-nourished. No distress.  HENT:  Head: Normocephalic and atraumatic.  Eyes: Conjunctivae are normal.  Neck: Normal range of motion. No thyromegaly present.  Cardiovascular: Normal rate and regular rhythm.   Pulmonary/Chest: Effort normal and breath sounds normal. She has no wheezes.  Abdominal: Soft. Bowel sounds are normal. There is no tenderness.  Musculoskeletal: Normal range of motion. She exhibits no edema or deformity.  Neurological: She is alert and oriented to person, place, and time.  Skin: Skin is warm and dry. She is not diaphoretic.  Small firm lesion Rle, no fluctuance, waemth or discharge   Psychiatric: She has a normal mood and affect.    BP 98/62 (BP Location: Left Arm, Patient Position: Sitting, Cuff  Size: Normal)   Pulse 62   Temp 98.4 F (36.9 C) (Oral)   Wt 175 lb 3.2 oz (79.5 kg)   LMP 04/04/2000   SpO2 97%   BMI 29.38 kg/m  Wt Readings from Last 3 Encounters:  02/16/16 173 lb (78.5 kg)  02/11/16 175 lb 3.2 oz (79.5 kg)  01/05/16 173 lb 8 oz (  78.7 kg)     Lab Results  Component Value Date   WBC 6.9 07/06/2015   HGB 13.4 07/06/2015   HCT 39.5 07/06/2015   PLT 342.0 07/06/2015   GLUCOSE 85 07/06/2015   CHOL 220 (H) 07/06/2015   TRIG 70.0 07/06/2015   HDL 51.60 07/06/2015   LDLDIRECT 145.4 11/14/2012   LDLCALC 154 (H) 07/06/2015   ALT 73 (H) 07/06/2015   AST 40 (H) 07/06/2015   NA 140 07/06/2015   K 4.6 07/06/2015   CL 106 07/06/2015   CREATININE 0.83 07/06/2015   BUN 20 07/06/2015   CO2 30 07/06/2015   TSH 1.56 07/06/2015   HGBA1C 5.9 07/06/2015   MICROALBUR <0.7 01/09/2015    Lab Results  Component Value Date   TSH 1.56 07/06/2015   Lab Results  Component Value Date   WBC 6.9 07/06/2015   HGB 13.4 07/06/2015   HCT 39.5 07/06/2015   MCV 88.7 07/06/2015   PLT 342.0 07/06/2015   Lab Results  Component Value Date   NA 140 07/06/2015   K 4.6 07/06/2015   CO2 30 07/06/2015   GLUCOSE 85 07/06/2015   BUN 20 07/06/2015   CREATININE 0.83 07/06/2015   BILITOT 0.5 07/06/2015   ALKPHOS 75 07/06/2015   AST 40 (H) 07/06/2015   ALT 73 (H) 07/06/2015   PROT 6.8 07/06/2015   ALBUMIN 3.9 07/06/2015   CALCIUM 9.8 07/06/2015   GFR 72.67 07/06/2015   Lab Results  Component Value Date   CHOL 220 (H) 07/06/2015   Lab Results  Component Value Date   HDL 51.60 07/06/2015   Lab Results  Component Value Date   LDLCALC 154 (H) 07/06/2015   Lab Results  Component Value Date   TRIG 70.0 07/06/2015   Lab Results  Component Value Date   CHOLHDL 4 07/06/2015   Lab Results  Component Value Date   HGBA1C 5.9 07/06/2015       Assessment & Plan:   Problem List Items Addressed This Visit    Hyperlipidemia, mixed    Tolerating statin, encouraged  heart healthy diet, avoid trans fats, minimize simple carbs and saturated fats. Increase exercise as tolerated      Relevant Orders   Hemoglobin A1c   Lipid panel   CBC   INCREASED BLOOD PRESSURE   Relevant Orders   Comprehensive metabolic panel   CBC   Preventative health care - Primary   Relevant Orders   Hemoglobin A1c   Comprehensive metabolic panel   Lipid panel   TSH   CBC   Overweight    Encouraged DASH diet, decrease po intake and increase exercise as tolerated. Needs 7-8 hours of sleep nightly. Avoid trans fats, eat small, frequent meals every 4-5 hours with lean proteins, complex carbs and healthy fats. Minimize simple carbs      Type 2 diabetes mellitus with hyperlipidemia ([facility])    hgba1c acceptable, minimize simple carbs. Increase exercise as tolerated. Continue current meds      Relevant Orders   Hemoglobin A1c   Comprehensive metabolic panel   CBC   Abnormal liver function tests    Mild, repeat cmp and minimize simple carbs      Relevant Orders   Hemoglobin A1c   TSH   Mass of ankle, right    Small lesion, not infected at this time. Due to slow healing LE [name] ordered. Elevate feet and apply ice alternating with heat if worsens.       Relevant Orders  [name] Extrem Low Right Ltd      I am having Ms. Dory maintain her cycloSPORINE, ipratropium, levocetirizine, DULoxetine, pravastatin, and triamcinolone cream.  No orders of the defined types were placed in this encounter.    Penni Homans, MD

## 2016-02-11 NOTE — Patient Instructions (Signed)

## 2016-02-11 NOTE — Progress Notes (Signed)
Pre visit review using our clinic review tool, if applicable. No additional management support is needed unless otherwise documented below in the visit note. 

## 2016-02-16 ENCOUNTER — Ambulatory Visit (AMBULATORY_SURGERY_CENTER): Payer: Self-pay | Admitting: *Deleted

## 2016-02-16 ENCOUNTER — Encounter: Payer: Self-pay | Admitting: Gastroenterology

## 2016-02-16 VITALS — Ht 65.25 in | Wt 173.0 lb

## 2016-02-16 DIAGNOSIS — Z1211 Encounter for screening for malignant neoplasm of colon: Secondary | ICD-10-CM

## 2016-02-16 MED ORDER — NA SULFATE-K SULFATE-MG SULF 17.5-3.13-1.6 GM/177ML PO SOLN: 1.0000 | Freq: Once | ORAL | 0 refills | Status: AC

## 2016-02-16 NOTE — Progress Notes (Signed)
No egg or soy allergy known to patient  No issues with past sedation with any surgeries  or procedures, no intubation problems- slow to wake    No diet pills per patient No home 02 use per patient  No blood thinners per patient  Pt denies issues with constipation  No A fib or A flutter

## 2016-02-17 ENCOUNTER — Telehealth: Payer: Self-pay | Admitting: Family Medicine

## 2016-02-17 NOTE — Telephone Encounter (Signed)
Not sure why she wants to see him. Does she want to go there for her left knee? OK with me please place the referral.

## 2016-02-17 NOTE — Telephone Encounter (Signed)
Please advise if okay to refer to Dr. Tamala Julian (sports medicine) and dx if so.

## 2016-02-17 NOTE — Telephone Encounter (Signed)
Caller name:Brinly Karrie Doffing Relationship to patient:self Can be reached: Pharmacy:  Reason for call:Patient called [facility] regarding seeing Dr Tamala Julian, do not see a referral. Please advise.

## 2016-02-18 NOTE — Telephone Encounter (Signed)
Please advise 

## 2016-02-18 NOTE — Telephone Encounter (Signed)
Called patient and left detailed message (per DPR) to return call and let us know what problem she wants to see Dr. Tamala Julian for. Will initiate referral once we have dx.

## 2016-02-18 NOTE — Telephone Encounter (Addendum)
Ultrasound for lower right leg extremities due to growth on right leg patient states PCP is aware of the "growth" best # [phone]. Informed patient order has been placed to [facility] patient voice understanding. Patient apologize for the confusion patient was inquiring about order placed for U/S.

## 2016-02-21 DIAGNOSIS — R2241 Localized swelling, mass and lump, right lower limb: Secondary | ICD-10-CM | POA: Insufficient documentation

## 2016-02-21 NOTE — Assessment & Plan Note (Signed)
Mild, repeat cmp and minimize simple carbs

## 2016-02-21 NOTE — Assessment & Plan Note (Signed)
Encouraged DASH diet, decrease po intake and increase exercise as tolerated. Needs 7-8 hours of sleep nightly. Avoid trans fats, eat small, frequent meals every 4-5 hours with lean proteins, complex carbs and healthy fats. Minimize simple carbs 

## 2016-02-21 NOTE — Assessment & Plan Note (Signed)
Tolerating statin, encouraged heart healthy diet, avoid trans fats, minimize simple carbs and saturated fats. Increase exercise as tolerated 

## 2016-02-21 NOTE — Assessment & Plan Note (Signed)
hgba1c acceptable, minimize simple carbs. Increase exercise as tolerated. Continue current meds 

## 2016-02-21 NOTE — Assessment & Plan Note (Signed)
Small lesion, not infected at this time. Due to slow healing LE [name] ordered. Elevate feet and apply ice alternating with heat if worsens.

## 2016-03-01 ENCOUNTER — Encounter: Payer: Medicare HMO | Admitting: Gastroenterology

## 2016-03-03 ENCOUNTER — Ambulatory Visit
Admission: RE | Admit: 2016-03-03 | Discharge: 2016-03-03 | Disposition: A | Discharge status: Home / Self Care | Payer: Medicare HMO | Source: Ambulatory Visit | Attending: Family Medicine | Admitting: Family Medicine

## 2016-03-03 DIAGNOSIS — R2241 Localized swelling, mass and lump, right lower limb: Secondary | ICD-10-CM

## 2016-03-03 DIAGNOSIS — L989 Disorder of the skin and subcutaneous tissue, unspecified: Secondary | ICD-10-CM | POA: Diagnosis not present

## 2016-03-11 ENCOUNTER — Ambulatory Visit
Admission: RE | Admit: 2016-03-11 | Discharge: 2016-03-11 | Disposition: A | Discharge status: Home / Self Care | Payer: Medicare HMO | Source: Ambulatory Visit | Attending: Certified Nurse Midwife | Admitting: Certified Nurse Midwife

## 2016-03-11 DIAGNOSIS — T6591XA Toxic effect of unspecified substance, accidental (unintentional), initial encounter: Secondary | ICD-10-CM | POA: Diagnosis not present

## 2016-03-11 DIAGNOSIS — Z1231 Encounter for screening mammogram for malignant neoplasm of breast: Secondary | ICD-10-CM | POA: Diagnosis not present

## 2016-04-06 DIAGNOSIS — B9689 Other specified bacterial agents as the cause of diseases classified elsewhere: Secondary | ICD-10-CM | POA: Diagnosis not present

## 2016-04-06 DIAGNOSIS — J329 Chronic sinusitis, unspecified: Secondary | ICD-10-CM | POA: Diagnosis not present

## 2016-04-18 ENCOUNTER — Ambulatory Visit (AMBULATORY_SURGERY_CENTER): Payer: Medicare HMO | Admitting: Gastroenterology

## 2016-04-18 ENCOUNTER — Encounter: Payer: Self-pay | Admitting: Gastroenterology

## 2016-04-18 VITALS — BP 130/66 | HR 79 | Temp 97.1°F | Resp 17 | Ht 65.25 in | Wt 173.0 lb

## 2016-04-18 DIAGNOSIS — Z1212 Encounter for screening for malignant neoplasm of rectum: Secondary | ICD-10-CM

## 2016-04-18 DIAGNOSIS — Z1211 Encounter for screening for malignant neoplasm of colon: Secondary | ICD-10-CM | POA: Diagnosis present

## 2016-04-18 DIAGNOSIS — G4733 Obstructive sleep apnea (adult) (pediatric): Secondary | ICD-10-CM | POA: Diagnosis not present

## 2016-04-18 DIAGNOSIS — E669 Obesity, unspecified: Secondary | ICD-10-CM | POA: Diagnosis not present

## 2016-04-18 MED ORDER — SODIUM CHLORIDE 0.9 % IV SOLN: 500.0000 mL | INTRAVENOUS | Status: DC

## 2016-04-18 NOTE — Op Note (Signed)
[facility] Patient Name: Cindy [name] Procedure Date: 04/18/2016 2:36 PM MRN: [id number] Endoscopist: Ladene Artist , MD Age: 71 Referring MD:  Date of Birth: [birth year] Gender: Female Account #: [id number] Procedure:                Colonoscopy Indications:              Screening for colorectal malignant neoplasm, Last                            colonoscopy 10 years ago Medicines:                Monitored Anesthesia Care Procedure:                Pre-Anesthesia Assessment:                           - Prior to the procedure, a History and Physical                            was performed, and patient medications and                            allergies were reviewed. The patient's tolerance of                            previous anesthesia was also reviewed. The risks                            and benefits of the procedure and the sedation                            options and risks were discussed with the patient.                            All questions were answered, and informed consent                            was obtained. Prior Anticoagulants: The patient has                            taken no previous anticoagulant or antiplatelet                            agents. ASA Grade Assessment: II - A patient with                            mild systemic disease. After reviewing the risks                            and benefits, the patient was deemed in                            satisfactory condition to undergo the procedure.  After obtaining informed consent, the colonoscope                            was passed under direct vision. Throughout the                            procedure, the patient's blood pressure, pulse, and                            oxygen saturations were monitored continuously. The                            Model PCF-H190DL ([id number]) scope was introduced                            through the anus and advanced to  the the cecum,                            identified by appendiceal orifice and ileocecal                            valve. The ileocecal valve, appendiceal orifice,                            and rectum were photographed. The quality of the                            bowel preparation was excellent. The colonoscopy                            was performed without difficulty. The patient                            tolerated the procedure well. Scope In: 2:47:12 PM Scope Out: 2:59:43 PM Scope Withdrawal Time: 0 hours 8 minutes 28 seconds  Total Procedure Duration: 0 hours 12 minutes 31 seconds  Findings:                 The perianal and digital rectal examinations were                            normal.                           A single small localized angiodysplastic lesion                            without bleeding was found in the cecum.                           The exam was otherwise without abnormality on                            direct and retroflexion views. Complications:            No immediate complications.  Estimated blood loss:                            None. Estimated Blood Loss:     Estimated blood loss: none. Impression:               - Cecal angiodysplasia.                           - The entire examined colon was otherewise normal                            on direct and retroflexion views.                           - No specimens collected. Recommendation:           - Repeat colonoscopy in 10 years for screening                            purposes.                           - Patient has a contact number available for                            emergencies. The signs and symptoms of potential                            delayed complications were discussed with the                            patient. Return to normal activities tomorrow.                            Written discharge instructions were provided to the                            patient.                            - Resume previous diet.                           - Continue present medications. Ladene Artist, MD 04/18/2016 3:03:27 PM This report has been signed electronically.

## 2016-04-18 NOTE — Progress Notes (Signed)
Report to PACU, RN, vss, BBS= Clear.  

## 2016-04-18 NOTE — Patient Instructions (Signed)
YOU HAD AN ENDOSCOPIC PROCEDURE TODAY AT THE [facility]:   Refer to the procedure report that was given to you for any specific questions about what was found during the examination.  If the procedure report does not answer your questions, please call your gastroenterologist to clarify.  If you requested that your care partner not be given the details of your procedure findings, then the procedure report has been included in a sealed envelope for you to review at your convenience later.  YOU SHOULD EXPECT: Some feelings of bloating in the abdomen. Passage of more gas than usual.  Walking can help get rid of the air that was put into your GI tract during the procedure and reduce the bloating. If you had a lower endoscopy (such as a colonoscopy or flexible sigmoidoscopy) you may notice spotting of blood in your stool or on the toilet paper. If you underwent a bowel prep for your procedure, you may not have a normal bowel movement for a few days.  Please Note:  You might notice some irritation and congestion in your nose or some drainage.  This is from the oxygen used during your procedure.  There is no need for concern and it should clear up in a day or so.  SYMPTOMS TO REPORT IMMEDIATELY:   Following lower endoscopy (colonoscopy or flexible sigmoidoscopy):  Excessive amounts of blood in the stool  Significant tenderness or worsening of abdominal pains  Swelling of the abdomen that is new, acute  Fever of 100F or higher   For urgent or emergent issues, a gastroenterologist can be reached at any hour by calling [phone].   DIET:  We do recommend a small meal at first, but then you may proceed to your regular diet.  Drink plenty of fluids but you should avoid alcoholic beverages for 24 hours.  ACTIVITY:  You should plan to take it easy for the rest of today and you should NOT DRIVE or use heavy machinery until tomorrow (because of the sedation medicines used during the test).     FOLLOW UP: Our staff will call the number listed on your records the next business day following your procedure to check on you and address any questions or concerns that you may have regarding the information given to you following your procedure. If we do not reach you, we will leave a message.  However, if you are feeling well and you are not experiencing any problems, there is no need to return our call.  We will assume that you have returned to your regular daily activities without incident.  If any biopsies were taken you will be contacted by phone or by letter within the next 1-3 weeks.  Please call us at [phone] if you have not heard about the biopsies in 3 weeks.   Repeat Colonoscopy in 10 years    SIGNATURES/CONFIDENTIALITY: You and/or your care partner have signed paperwork which will be entered into your electronic medical record.  These signatures attest to the fact that that the information above on your After Visit Summary has been reviewed and is understood.  Full responsibility of the confidentiality of this discharge information lies with you and/or your care-partner. 

## 2016-04-19 ENCOUNTER — Telehealth: Payer: Self-pay

## 2016-04-19 ENCOUNTER — Telehealth: Payer: Self-pay | Admitting: *Deleted

## 2016-04-19 NOTE — Telephone Encounter (Signed)
Message left

## 2016-04-19 NOTE — Telephone Encounter (Signed)
  Follow up Call-  Call back number 04/18/2016  Post procedure Call Back phone  # [phone]  Permission to leave phone message Yes  Some recent data might be hidden    Patient was called for follow up after her procedure on 04/18/2016. No answer at the number given for follow up phone call. A message was left on her voice mail.

## 2016-05-18 NOTE — Progress Notes (Signed)
[facility] [address] [place], [place] [address] Phone: [phone] Subjective:    I'm seeing this patient by the request  of: Penni Homans, MD    CC:  Left shoulder   [id number]  Cindy [name] is a 71 y.o. female coming in with complaint of left shoulder pain. Patient states Has had pain down generalized for multiple months. Patient since then seems to start given her trouble with certain daily activities such as even dressing. Patient states it can be uncomfortable at night but does not wake her up at night. Mild radiation down the posterior aspect of the arm she states. Patient denies any weakness. Rates the severity of pain a 6 out of 10. Does respond to anti-inflammatories but tries not to take him regularly. Continues to work out on a regular basis but has stopped any overhead activities because seemed to make it worse     Past Medical History:  Diagnosis Date  . Abnormal liver function tests 07/19/2015  . Allergic state 02/01/2010   Qualifier: Diagnosis of  By: Regis Bill MD, Standley Brooking   . Allergy   . Anxiety   . Back pain   . Chicken pox 71 yrs old  . Depressive disorder, not elsewhere classified   . Diabetes ([place])    diet controlled- no medicines   . Fibroid   . History of fractured kneecap    right   . Hyperglycemia   . Knee pain, left 10/05/2011   Follows with Dr Margarette Canada  . Measles as a child  . Medicare annual wellness visit, subsequent 07/06/2014  . OA (osteoarthritis) of knee    left knee  . Obesity 10/05/2011  . Osteoarthritis   . Osteopenia 06/26/2014  . Other and unspecified hyperlipidemia   . Overweight 10/05/2011  . Overweight(278.02) 10/05/2011  . Sleep apnea     On CPAP  . Sleep apnea   . Stye 11/19/2012  . Thyroid disease    h/o hyperthyroid treated with radioactive iodine? from 16 to 20  . Type 2 diabetes mellitus with hyperlipidemia ([place])   . Varicose vein of leg    Past Surgical History:  Procedure Laterality Date  .  BACK SURGERY     L5 discectomy, 2002. helpful  . CERVICAL BIOPSY  W/ LOOP ELECTRODE EXCISION    . CESAREAN SECTION  1987  . COLONOSCOPY    . CPAP    . KNEE SURGERY Right 2006   R knee fx. patella  . LEEP     Social History   Social History  . Marital status: Divorced    Spouse name: N/A  . Number of children: N/A  . Years of education: N/A   Social History Main Topics  . Smoking status: Never Smoker  . Smokeless tobacco: Never Used  . Alcohol use 0.6 oz/week    1 Glasses of wine per week  . Drug use: No  . Sexual activity: No   Other Topics Concern  . None   Social History Narrative   Recently divorced separated fall 2012    Now living with friend in [place] area .   Never smoker   Regular exercise   Sleep 01/12/29 up at 6; job hours irreg             Allergies  Allergen Reactions  . Codeine Other (See Comments)    hallucinations  . Crestor [Rosuvastatin]     REACTION: myalgias  . Other Other (See Comments)  NOVACAINE- blisters left side of mouth after receiving   . Amoxicillin Rash  . Erythromycin Other (See Comments)    cramps  . Sulfonamide Derivatives Rash   Family History  Problem Relation Age of Onset  . Hyperlipidemia Mother   . Dementia Mother   . Hyperlipidemia Father   . Heart disease Father     bypass, CAD  . Hyperlipidemia Sister   . Diabetes Sister     type 2  . Colon polyps Sister   . Dementia Maternal Grandmother     alzheimer  . Alcohol abuse Maternal Grandfather   . Diabetes Paternal Grandmother     type 2?  . Colon cancer Neg Hx   . Esophageal cancer Neg Hx   . Rectal cancer Neg Hx   . Stomach cancer Neg Hx     Past medical history, social, surgical and family history all reviewed in electronic medical record.  No pertanent information unless stated regarding to the chief complaint.   Review of Systems:Review of systems updated and as accurate as of 05/19/16  No headache, visual changes, nausea, vomiting, diarrhea,  constipation, dizziness, abdominal pain, skin rash, fevers, chills, night sweats, weight loss, swollen lymph nodes, body aches, joint swelling, muscle aches, chest pain, shortness of breath, mood changes.   Objective  Blood pressure 114/72, pulse 65, height 5\' 5"  (1.651 m), weight 176 lb (79.8 kg), last menstrual period 04/04/2000, SpO2 99 %. Systems examined below as of 05/19/16   General: No apparent distress alert and oriented x3 mood and affect normal, dressed appropriately.  HEENT: Pupils equal, extraocular movements intact  Respiratory: Patient's speak in full sentences and does not appear short of breath  Cardiovascular: No lower extremity edema, non tender, no erythema  Skin: Warm dry intact with no signs of infection or rash on extremities or on axial skeleton.  Abdomen: Soft nontender  Neuro: Cranial nerves II through XII are intact, neurovascularly intact in all extremities with 2+ DTRs and 2+ pulses.  Lymph: No lymphadenopathy of posterior or anterior cervical chain or axillae bilaterally.  Gait normal with good balance and coordination.  MSK:  Non tender with full range of motion and good stability and symmetric strength and tone of  elbows, wrist, hip, knee and ankles bilaterally.  Shoulder: left Inspection reveals no abnormalities, atrophy or asymmetry. Palpation is normal with no tenderness over AC joint or bicipital groove. ROM is full in all planes passively. Rotator cuff strength normal throughout. signs of impingement with positive Neer and Hawkin's tests, but negative empty can sign. Speeds and Yergason's tests normal. No labral pathology noted with negative Obrien's, negative clunk and good stability. Normal scapular function observed. No painful arc and no drop arm sign. No apprehension sign  MSK US performed of: left This study was ordered, performed, and interpreted by Charlann Boxer D.O.  Shoulder:   Supraspinatus:  Appears normal on long and transverse views,  Bursal bulge seen with shoulder abduction on impingement view. Infraspinatus:  Appears normal on long and transverse views. Significant increase in Doppler flow Subscapularis:  Appears normal on long and transverse views. Positive bursa Teres Minor:  Appears normal on long and transverse views. AC joint:  Capsule undistended, no geyser sign. Glenohumeral Joint:  Appears normal without effusion. Glenoid Labrum:  Intact without visualized tears. Biceps Tendon:  Appears normal on long and transverse views, no fraying of tendon, tendon located in intertubercular groove, no subluxation with shoulder internal or external rotation.  Impression: Subacromial bursitis   Procedure  note 97110; 15 minutes spent for Therapeutic exercises as stated in above notes.  This included exercises focusing on stretching, strengthening, with significant focus on eccentric aspects.  Shoulder Exercises that included:  Basic scapular stabilization to include adduction and depression of scapula Scaption, focusing on proper movement and good control Internal and External rotation utilizing a theraband, with elbow tucked at side entire time Rows with theraband   Proper technique shown and discussed handout in great detail with ATC.  All questions were discussed and answered.     Impression and Recommendations:     This case required medical decision making of moderate complexity.      Note: This dictation was prepared with Dragon dictation along with smaller phrase technology. Any transcriptional errors that result from this process are unintentional.

## 2016-05-19 ENCOUNTER — Encounter: Payer: Self-pay | Admitting: Family Medicine

## 2016-05-19 ENCOUNTER — Ambulatory Visit: Payer: Self-pay

## 2016-05-19 ENCOUNTER — Telehealth: Payer: Self-pay | Admitting: Certified Nurse Midwife

## 2016-05-19 ENCOUNTER — Ambulatory Visit (INDEPENDENT_AMBULATORY_CARE_PROVIDER_SITE_OTHER): Payer: Medicare HMO | Admitting: Family Medicine

## 2016-05-19 VITALS — BP 114/72 | HR 65 | Ht 65.0 in | Wt 176.0 lb

## 2016-05-19 DIAGNOSIS — M25512 Pain in left shoulder: Secondary | ICD-10-CM

## 2016-05-19 DIAGNOSIS — M7552 Bursitis of left shoulder: Secondary | ICD-10-CM | POA: Insufficient documentation

## 2016-05-19 NOTE — Patient Instructions (Signed)
Good to see you.  Ice 20 minutes 2 times daily. Usually after activity and before bed. Exercises 3 times a week.  Keep hands within peripheral vision.  pennsaid pinkie amount topically 2 times daily as needed.  Vitamin D 2000 IU daily  Turmeric 500mg  daily  Tart cherry extract any dose at night See me again in 4 weeks to make sure you are doing well.

## 2016-05-19 NOTE — Telephone Encounter (Signed)
Erroneous encounter

## 2016-05-19 NOTE — Assessment & Plan Note (Signed)
Patient was found to have more bursitis. Patient's rotator cuff seemed to be doing well. No loose associated neck pain and no signs of any cervical radiculopathy today. No weakness. I do think patient will do relatively well with conservative therapy. Given home exercises, icing protocol, given trial of topical anti-inflammatories. Patient will come back and see me again in 4 weeks. Worsening symptoms we'll consider injection as well as potentially physical therapy.

## 2016-06-16 ENCOUNTER — Encounter: Payer: Self-pay | Admitting: Family Medicine

## 2016-06-16 ENCOUNTER — Ambulatory Visit (INDEPENDENT_AMBULATORY_CARE_PROVIDER_SITE_OTHER): Payer: Medicare HMO | Admitting: Family Medicine

## 2016-06-16 DIAGNOSIS — M7552 Bursitis of left shoulder: Secondary | ICD-10-CM

## 2016-06-16 NOTE — Assessment & Plan Note (Signed)
Patient is doing well overall. We discussed with patient to continue conservative therapy. Patient declined formal physical therapy because she is traveling. Encourage her to continue the topical anti-inflammatories as well as the over-the-counter medications. Patient will follow-up with me again in 6 weeks. Worsening symptoms we'll consider injection and physical therapy still.

## 2016-06-16 NOTE — Patient Instructions (Signed)
Good to see you  You will do great  pennsaid pinkie amount topically 2 times daily as needed.  2 baby aspirin daily for 21 weeks.  See me again in 6 weeks if you still have pain

## 2016-06-16 NOTE — Progress Notes (Signed)
[facility] [address] [place], [place] [address] Phone: [phone] Subjective:    I'm seeing this patient by the request  of: Penni Homans, MD    CC:  Left shoulder   [id number]  Cindy [name] is a 71 y.o. female coming in with complaint of left shoulder pain. Patient found to have more of a subacromial bursitis. Patient elected do conservative therapy for the next 4 weeks. Patient states doing apparently 70% better. Still some movement gives some discomfort. Nothing severe. Is sleeping better. Very happy with the results of far. No weakness. No new symptoms.     Past Medical History:  Diagnosis Date  . Abnormal liver function tests 07/19/2015  . Allergic state 02/01/2010   Qualifier: Diagnosis of  By: Regis Bill MD, Standley Brooking   . Allergy   . Anxiety   . Back pain   . Chicken pox 71 yrs old  . Depressive disorder, not elsewhere classified   . Diabetes ([place])    diet controlled- no medicines   . Fibroid   . History of fractured kneecap    right   . Hyperglycemia   . Knee pain, left 10/05/2011   Follows with Dr Margarette Canada  . Measles as a child  . Medicare annual wellness visit, subsequent 07/06/2014  . OA (osteoarthritis) of knee    left knee  . Obesity 10/05/2011  . Osteoarthritis   . Osteopenia 06/26/2014  . Other and unspecified hyperlipidemia   . Overweight 10/05/2011  . Overweight(278.02) 10/05/2011  . Sleep apnea     On CPAP  . Sleep apnea   . Stye 11/19/2012  . Thyroid disease    h/o hyperthyroid treated with radioactive iodine? from 16 to 20  . Type 2 diabetes mellitus with hyperlipidemia ([place])   . Varicose vein of leg    Past Surgical History:  Procedure Laterality Date  . BACK SURGERY     L5 discectomy, 2002. helpful  . CERVICAL BIOPSY  W/ LOOP ELECTRODE EXCISION    . CESAREAN SECTION  1987  . COLONOSCOPY    . CPAP    . KNEE SURGERY Right 2006   R knee fx. patella  . LEEP     Social History   Social History  . Marital status:  Divorced    Spouse name: N/A  . Number of children: N/A  . Years of education: N/A   Social History Main Topics  . Smoking status: Never Smoker  . Smokeless tobacco: Never Used  . Alcohol use 0.6 oz/week    1 Glasses of wine per week  . Drug use: No  . Sexual activity: No   Other Topics Concern  . None   Social History Narrative   Recently divorced separated fall 2012    Now living with friend in [place] area .   Never smoker   Regular exercise   Sleep 01/12/29 up at 6; job hours irreg             Allergies  Allergen Reactions  . Codeine Other (See Comments)    hallucinations  . Crestor [Rosuvastatin]     REACTION: myalgias  . Other Other (See Comments)    NOVACAINE- blisters left side of mouth after receiving   . Amoxicillin Rash  . Erythromycin Other (See Comments)    cramps  . Sulfonamide Derivatives Rash   Family History  Problem Relation Age of Onset  . Hyperlipidemia Mother   . Dementia Mother   .  Hyperlipidemia Father   . Heart disease Father     bypass, CAD  . Hyperlipidemia Sister   . Diabetes Sister     type 2  . Colon polyps Sister   . Dementia Maternal Grandmother     alzheimer  . Alcohol abuse Maternal Grandfather   . Diabetes Paternal Grandmother     type 2?  . Colon cancer Neg Hx   . Esophageal cancer Neg Hx   . Rectal cancer Neg Hx   . Stomach cancer Neg Hx     Past medical history, social, surgical and family history all reviewed in electronic medical record.  No pertanent information unless stated regarding to the chief complaint.   Review of Systems: No headache, visual changes, nausea, vomiting, diarrhea, constipation, dizziness, abdominal pain, skin rash, fevers, chills, night sweats, weight loss, swollen lymph nodes, body aches, joint swelling, muscle aches, chest pain, shortness of breath, mood changes.    Objective  Blood pressure 110/64, pulse 79, height 5\' 5"  (1.651 m), weight 168 lb 6.4 oz (76.4 kg), last menstrual  period 04/04/2000, SpO2 98 %. Systems examined below as of 06/16/16   General: No apparent distress alert and oriented x3 mood and affect normal, dressed appropriately.  HEENT: Pupils equal, extraocular movements intact  Respiratory: Patient's speak in full sentences and does not appear short of breath  Cardiovascular: No lower extremity edema, non tender, no erythema  Skin: Warm dry intact with no signs of infection or rash on extremities or on axial skeleton.  Abdomen: Soft nontender  Neuro: Cranial nerves II through XII are intact, neurovascularly intact in all extremities with 2+ DTRs and 2+ pulses.  Lymph: No lymphadenopathy of posterior or anterior cervical chain or axillae bilaterally.  Gait normal with good balance and coordination.  MSK:  Non tender with full range of motion and good stability and symmetric strength and tone of  elbows, wrist, hip, knee and ankles bilaterally.  Shoulder: Left Inspection reveals no abnormalities, atrophy or asymmetry. Palpation is normal with no tenderness over AC joint or bicipital groove. ROM is full in all planes. Rotator cuff strength normal throughout. Continue mild impingement sign still remaining but improved Speeds and Yergason's tests normal. No labral pathology noted with negative Obrien's, negative clunk and good stability. Normal scapular function observed. No painful arc and no drop arm sign. No apprehension sign Contralateral shoulder unremarkable       Impression and Recommendations:     This case required medical decision making of moderate complexity.      Note: This dictation was prepared with Dragon dictation along with smaller phrase technology. Any transcriptional errors that result from this process are unintentional.

## 2016-07-05 ENCOUNTER — Other Ambulatory Visit: Payer: Self-pay | Admitting: Family Medicine

## 2016-07-11 DIAGNOSIS — Z0000 Encounter for general adult medical examination without abnormal findings: Secondary | ICD-10-CM | POA: Diagnosis not present

## 2016-07-11 DIAGNOSIS — R600 Localized edema: Secondary | ICD-10-CM | POA: Diagnosis not present

## 2016-07-11 DIAGNOSIS — R42 Dizziness and giddiness: Secondary | ICD-10-CM | POA: Diagnosis not present

## 2016-07-11 DIAGNOSIS — M7552 Bursitis of left shoulder: Secondary | ICD-10-CM | POA: Diagnosis not present

## 2016-07-11 DIAGNOSIS — Z7982 Long term (current) use of aspirin: Secondary | ICD-10-CM | POA: Diagnosis not present

## 2016-07-11 DIAGNOSIS — M545 Low back pain: Secondary | ICD-10-CM | POA: Diagnosis not present

## 2016-07-11 DIAGNOSIS — E785 Hyperlipidemia, unspecified: Secondary | ICD-10-CM | POA: Diagnosis not present

## 2016-07-11 DIAGNOSIS — J309 Allergic rhinitis, unspecified: Secondary | ICD-10-CM | POA: Diagnosis not present

## 2016-07-11 DIAGNOSIS — R233 Spontaneous ecchymoses: Secondary | ICD-10-CM | POA: Diagnosis not present

## 2016-07-11 DIAGNOSIS — N329 Bladder disorder, unspecified: Secondary | ICD-10-CM | POA: Diagnosis not present

## 2016-07-23 ENCOUNTER — Other Ambulatory Visit: Payer: Self-pay | Admitting: Family Medicine

## 2016-07-25 ENCOUNTER — Ambulatory Visit (INDEPENDENT_AMBULATORY_CARE_PROVIDER_SITE_OTHER): Payer: Medicare HMO | Admitting: Family Medicine

## 2016-07-25 ENCOUNTER — Encounter: Payer: Self-pay | Admitting: Family Medicine

## 2016-07-25 VITALS — BP 108/64 | HR 68 | Temp 98.2°F | Resp 18 | Ht 65.0 in | Wt 171.6 lb

## 2016-07-25 DIAGNOSIS — E1169 Type 2 diabetes mellitus with other specified complication: Secondary | ICD-10-CM | POA: Diagnosis not present

## 2016-07-25 DIAGNOSIS — E782 Mixed hyperlipidemia: Secondary | ICD-10-CM

## 2016-07-25 DIAGNOSIS — T7840XD Allergy, unspecified, subsequent encounter: Secondary | ICD-10-CM | POA: Diagnosis not present

## 2016-07-25 DIAGNOSIS — R945 Abnormal results of liver function studies: Secondary | ICD-10-CM | POA: Diagnosis not present

## 2016-07-25 DIAGNOSIS — Z0000 Encounter for general adult medical examination without abnormal findings: Secondary | ICD-10-CM

## 2016-07-25 DIAGNOSIS — J329 Chronic sinusitis, unspecified: Secondary | ICD-10-CM | POA: Diagnosis not present

## 2016-07-25 DIAGNOSIS — E785 Hyperlipidemia, unspecified: Secondary | ICD-10-CM

## 2016-07-25 DIAGNOSIS — L989 Disorder of the skin and subcutaneous tissue, unspecified: Secondary | ICD-10-CM

## 2016-07-25 DIAGNOSIS — E6609 Other obesity due to excess calories: Secondary | ICD-10-CM

## 2016-07-25 DIAGNOSIS — M545 Low back pain: Secondary | ICD-10-CM | POA: Diagnosis not present

## 2016-07-25 DIAGNOSIS — G4733 Obstructive sleep apnea (adult) (pediatric): Secondary | ICD-10-CM | POA: Diagnosis not present

## 2016-07-25 DIAGNOSIS — Z23 Encounter for immunization: Secondary | ICD-10-CM | POA: Diagnosis not present

## 2016-07-25 DIAGNOSIS — R7989 Other specified abnormal findings of blood chemistry: Secondary | ICD-10-CM

## 2016-07-25 MED ORDER — ZOSTER VAC RECOMB ADJUVANTED 50 MCG/0.5ML IM SUSR: 0.5000 mL | Freq: Once | INTRAMUSCULAR | 1 refills | Status: AC

## 2016-07-25 MED ORDER — PRAVASTATIN SODIUM 80 MG PO TABS: 40.0000 mg | ORAL_TABLET | Freq: Every day | ORAL | 0 refills | Status: DC

## 2016-07-25 MED ORDER — ZOSTER VAC RECOMB ADJUVANTED 50 MCG/0.5ML IM SUSR: 0.5000 mL | Freq: Once | INTRAMUSCULAR | 0 refills | Status: DC

## 2016-07-25 NOTE — Assessment & Plan Note (Signed)
Patient encouraged to maintain heart healthy diet, regular exercise, adequate sleep. Consider daily probiotics. Take medications as prescribed 

## 2016-07-25 NOTE — Assessment & Plan Note (Signed)
Tolerating Pravastatin at 40 mg qhs, check lipids today

## 2016-07-25 NOTE — Assessment & Plan Note (Signed)
Mild, her pravastatin was dropped in 1/2, recheck her levels. Minimize carbohydrates, consider fatty liver.

## 2016-07-25 NOTE — Assessment & Plan Note (Signed)
Increase fluid intake, plain Mucinex twice a day, add vitamin C, Elderberry, Zinc, aged or black garlic, call if worsens.

## 2016-07-25 NOTE — Assessment & Plan Note (Signed)
Trouble for the past month since returning home from a trip to [place]. Has been using Flonase and nasal steroids, encouraged to add Zyrtec or Xyzal daily. Consider Singulair

## 2016-07-25 NOTE — Assessment & Plan Note (Signed)
hgba1c acceptable, minimize simple carbs. Increase exercise as tolerated.  

## 2016-07-25 NOTE — Assessment & Plan Note (Signed)
Encouraged DASH diet, decrease po intake and increase exercise as tolerated. Needs 7-8 hours of sleep nightly. Avoid trans fats, eat small, frequent meals every 4-5 hours with lean proteins, complex carbs and healthy fats. Minimize simple carbs, bariatric referral 

## 2016-07-25 NOTE — Progress Notes (Signed)
Subjective:  I acted as a Education administrator for Dr. Charlett Blake. [name], [place]   Patient ID: Cindy [name], female    DOB: [birth year], 71 y.o.   MRN: [id number]  Chief Complaint  Patient presents with  . Follow-up    HPI  Patient is in today for annual preventative medical exam and follow up on chronic medical concerns. She has no trouble with her ADL's and is trying to stay active and maintain a heart healthy diet. No recent febrile illness or recent hospitalizations. She c/o a runny nose and head congestion. No fevers or chills. She is noting back pain and myalgias that are ongoing. No falls or injury. Denies CP/palp/SOB/HA/fevers/GI or GU c/o. Taking meds as prescribed. She denies polyuria or polydipsia. Is still having a lesion on the bottom of her left foot that is painful with weight bearing.   Patient Care Team: Mosie Lukes, MD as PCP - General (Family Medicine) Regina Eck, CNM as Referring Physician (Certified Nurse Midwife) Deneise Lever, MD as Consulting Physician (Pulmonary Disease) Jaclyn Prime, [place] as Referring Physician (Chiropractic Medicine) Marica Otter, OD (Optometry) Crista Luria, MD as Consulting Physician (Dermatology) Elam Dutch, MD as Consulting Physician (Vascular Surgery) Monna Fam, MD as Consulting Physician (Ophthalmology)   Past Medical History:  Diagnosis Date  . Abnormal liver function tests 07/19/2015  . Allergic state 02/01/2010   Qualifier: Diagnosis of  By: Regis Bill MD, Standley Brooking   . Allergy   . Anxiety   . Back pain   . Chicken pox 72 yrs old  . Depressive disorder, not elsewhere classified   . Diabetes ([place])    diet controlled- no medicines   . Fibroid   . History of fractured kneecap    right   . Hyperglycemia   . Knee pain, left 10/05/2011   Follows with Dr Margarette Canada  . Measles as a child  . Medicare annual wellness visit, subsequent 07/06/2014  . OA (osteoarthritis) of knee    left knee  . Obesity 10/05/2011  . Obesity 10/05/2011  .  Osteoarthritis   . Osteopenia 06/26/2014  . Other and unspecified hyperlipidemia   . Overweight 10/05/2011  . Overweight(278.02) 10/05/2011  . Sleep apnea     On CPAP  . Sleep apnea   . Stye 11/19/2012  . Thyroid disease    h/o hyperthyroid treated with radioactive iodine? from 16 to 20  . Type 2 diabetes mellitus with hyperlipidemia ([place])   . Varicose vein of leg     Past Surgical History:  Procedure Laterality Date  . BACK SURGERY     L5 discectomy, 2002. helpful  . CERVICAL BIOPSY  W/ LOOP ELECTRODE EXCISION    . CESAREAN SECTION  1987  . COLONOSCOPY    . CPAP    . KNEE SURGERY Right 2006   R knee fx. patella  . LEEP      Family History  Problem Relation Age of Onset  . Hyperlipidemia Mother   . Dementia Mother   . Hyperlipidemia Father   . Heart disease Father     bypass, CAD  . Hyperlipidemia Sister   . Diabetes Sister     type 2  . Colon polyps Sister   . Dementia Maternal Grandmother     alzheimer  . Alcohol abuse Maternal Grandfather   . Diabetes Paternal Grandmother     type 2?  . Colon cancer Neg Hx   . Esophageal cancer Neg Hx   . Rectal cancer  Neg Hx   . Stomach cancer Neg Hx     Social History   Social History  . Marital status: Divorced    Spouse name: N/A  . Number of children: N/A  . Years of education: N/A   Occupational History  . Not on file.   Social History Main Topics  . Smoking status: Never Smoker  . Smokeless tobacco: Never Used  . Alcohol use 0.6 oz/week    1 Glasses of wine per week  . Drug use: No  . Sexual activity: No   Other Topics Concern  . Not on file   Social History Narrative   Recently divorced separated fall 2012    Now living with friend in [place] area .   Never smoker   Regular exercise   Sleep 01/12/29 up at 6; job hours irreg              Outpatient Medications Prior to Visit  Medication Sig Dispense Refill  . cholecalciferol (VITAMIN D) 1000 units tablet Take 1,000 Units by mouth daily.    .  cycloSPORINE (RESTASIS) 0.05 % ophthalmic emulsion Place 1 drop into both eyes daily as needed. Reported on 07/09/2015    . DULoxetine (CYMBALTA) 30 MG capsule TAKE ONE CAPSULE BY MOUTH DAILY 30 capsule 0  . fluticasone (FLONASE) 50 MCG/ACT nasal spray Place 1 spray into both nostrils daily.    Marland Kitchen levocetirizine (XYZAL) 5 MG tablet Take 5 mg by mouth as needed.   3  . Turmeric 500 MG CAPS Take by mouth.    . pravastatin (PRAVACHOL) 80 MG tablet TAKE ONE-HALF (1/2) TABLET BY MOUTH DAILY 45 tablet 2   Facility-Administered Medications Prior to Visit  Medication Dose Route Frequency Provider Last Rate Last Dose  . 0.9 %  sodium chloride infusion  500 mL Intravenous Continuous Ladene Artist, MD        Allergies  Allergen Reactions  . Codeine Other (See Comments)    hallucinations  . Crestor [Rosuvastatin]     REACTION: myalgias  . Other Other (See Comments)    NOVACAINE- blisters left side of mouth after receiving   . Amoxicillin Rash  . Erythromycin Other (See Comments)    cramps  . Sulfonamide Derivatives Rash    Review of Systems  Constitutional: Positive for malaise/fatigue. Negative for fever.  HENT: Positive for congestion.   Eyes: Negative for blurred vision.  Respiratory: Negative for cough and shortness of breath.   Cardiovascular: Negative for chest pain, palpitations and leg swelling.  Gastrointestinal: Negative for vomiting.  Musculoskeletal: Positive for back pain and joint pain.  Skin: Negative for rash.  Neurological: Negative for loss of consciousness and headaches.       Objective:    Physical Exam  Constitutional: She is oriented to person, place, and time. She appears well-developed and well-nourished. No distress.  HENT:  Head: Normocephalic and atraumatic.  Eyes: Conjunctivae are normal.  Neck: Normal range of motion. No thyromegaly present.  Cardiovascular: Normal rate and regular rhythm.   Pulmonary/Chest: Effort normal and breath sounds normal. She  has no wheezes.  Abdominal: Soft. Bowel sounds are normal. There is no tenderness.  Musculoskeletal: Normal range of motion. She exhibits no edema or deformity.  Neurological: She is alert and oriented to person, place, and time.  Skin: Skin is warm and dry. She is not diaphoretic.  2 mm firm white lesion base of left foot. Hard at lateral aspect of foot. Plantar surface  Psychiatric: She  has a normal mood and affect.    BP 108/64 (BP Location: Left Arm, Patient Position: Sitting, Cuff Size: Normal)   Pulse 68   Temp 98.2 F (36.8 C) (Oral)   Resp 18   Ht 5\' 5"  (1.651 m)   Wt 171 lb 9.6 oz (77.8 kg)   LMP 04/04/2000   SpO2 98%   BMI 28.56 kg/m  Wt Readings from Last 3 Encounters:  07/25/16 171 lb 9.6 oz (77.8 kg)  06/16/16 168 lb 6.4 oz (76.4 kg)  05/19/16 176 lb (79.8 kg)   BP Readings from Last 3 Encounters:  07/25/16 108/64  06/16/16 110/64  05/19/16 114/72     Immunization History  Administered Date(s) Administered  . Influenza Split 06/02/2012, 12/30/2014  . Influenza Whole 03/19/2007  . Influenza,inj,Quad PF,36+ Mos 01/02/2014, 12/08/2015  . Influenza-Unspecified 12/03/2012  . Pneumococcal Conjugate-13 01/09/2015  . Pneumococcal Polysaccharide-23 07/25/2016  . Td 11/08/2005  . Tdap 06/02/2012  . Zoster 10/05/2011    Health Maintenance  Topic Date Due  . FOOT EXAM  06/26/2015  . HEMOGLOBIN A1C  01/05/2016  . URINE MICROALBUMIN  01/09/2016  . PNA vac Low Risk Adult (2 of 2 - PPSV23) 01/09/2016  . OPHTHALMOLOGY EXAM  09/15/2016  . INFLUENZA VACCINE  11/02/2016  . MAMMOGRAM  03/11/2017  . TETANUS/TDAP  06/03/2022  . COLONOSCOPY  04/18/2026  . DEXA SCAN  Completed  . Hepatitis C Screening  Completed    Lab Results  Component Value Date   WBC 6.9 07/06/2015   HGB 13.4 07/06/2015   HCT 39.5 07/06/2015   PLT 342.0 07/06/2015   GLUCOSE 85 07/06/2015   CHOL 220 (H) 07/06/2015   TRIG 70.0 07/06/2015   HDL 51.60 07/06/2015   LDLDIRECT 145.4 11/14/2012    LDLCALC 154 (H) 07/06/2015   ALT 73 (H) 07/06/2015   AST 40 (H) 07/06/2015   NA 140 07/06/2015   K 4.6 07/06/2015   CL 106 07/06/2015   CREATININE 0.83 07/06/2015   BUN 20 07/06/2015   CO2 30 07/06/2015   TSH 1.56 07/06/2015   HGBA1C 5.9 07/06/2015   MICROALBUR <0.7 01/09/2015    Lab Results  Component Value Date   TSH 1.56 07/06/2015   Lab Results  Component Value Date   WBC 6.9 07/06/2015   HGB 13.4 07/06/2015   HCT 39.5 07/06/2015   MCV 88.7 07/06/2015   PLT 342.0 07/06/2015   Lab Results  Component Value Date   NA 140 07/06/2015   K 4.6 07/06/2015   CO2 30 07/06/2015   GLUCOSE 85 07/06/2015   BUN 20 07/06/2015   CREATININE 0.83 07/06/2015   BILITOT 0.5 07/06/2015   ALKPHOS 75 07/06/2015   AST 40 (H) 07/06/2015   ALT 73 (H) 07/06/2015   PROT 6.8 07/06/2015   ALBUMIN 3.9 07/06/2015   CALCIUM 9.8 07/06/2015   GFR 72.67 07/06/2015   Lab Results  Component Value Date   CHOL 220 (H) 07/06/2015   Lab Results  Component Value Date   HDL 51.60 07/06/2015   Lab Results  Component Value Date   LDLCALC 154 (H) 07/06/2015   Lab Results  Component Value Date   TRIG 70.0 07/06/2015   Lab Results  Component Value Date   CHOLHDL 4 07/06/2015   Lab Results  Component Value Date   HGBA1C 5.9 07/06/2015         Assessment & Plan:   Problem List Items Addressed This Visit    Hyperlipidemia, mixed    Tolerating  Pravastatin at 40 mg qhs, check lipids today      Relevant Medications   pravastatin (PRAVACHOL) 80 MG tablet   Other Relevant Orders   Lipid panel   Obstructive sleep apnea    Using CPAP nightly. Not sure if it helps her sleep better      Allergic state    Trouble for the past month since returning home from a trip to [place]. Has been using Flonase and nasal steroids, encouraged to add Zyrtec or Xyzal daily. Consider Singulair      Preventative health care    Patient encouraged to maintain heart healthy diet, regular exercise,  adequate sleep. Consider daily probiotics. Take medications as prescribed      Obesity    Encouraged DASH diet, decrease po intake and increase exercise as tolerated. Needs 7-8 hours of sleep nightly. Avoid trans fats, eat small, frequent meals every 4-5 hours with lean proteins, complex carbs and healthy fats. Minimize simple carbs, bariatric referral      Type 2 diabetes mellitus with hyperlipidemia (H[facility])    hgba1c acceptable, minimize simple carbs. Increase exercise as tolerated.       Relevant Medications   pravastatin (PRAVACHOL) 80 MG tablet   Other Relevant Orders   Hemoglobin A1c   CBC   Comprehensive metabolic panel   TSH   Albumin   Back pain    Much better with adjustable bed. No need for Cymbalta any longer. Drop her 30 mg cap to qod and then stop if no concerns      Sinusitis    Increase fluid intake, plain Mucinex twice a day, add vitamin C, Elderberry, Zinc, aged or black garlic, call if worsens.      Medicare annual wellness visit, subsequent - Primary   Abnormal liver function tests    Mild, her pravastatin was dropped in 1/2, recheck her levels. Minimize carbohydrates, consider fatty liver.      Relevant Orders   Albumin    Other Visit Diagnoses    Need for viral immunization       Relevant Medications   Zoster Vac Recomb Adjuvanted [facility]) injection   Skin lesion of foot       Relevant Orders   Ambulatory referral to Podiatry   Need for pneumococcal vaccination       Relevant Orders   Pneumococcal polysaccharide vaccine 23-valent greater than or equal to 2yo subcutaneous/IM (Completed)      I have changed Ms. Awe's pravastatin. I am also having her maintain her cycloSPORINE, levocetirizine, fluticasone, cholecalciferol, Turmeric, DULoxetine, and Zoster Vac Recomb Adjuvanted. We will continue to administer sodium chloride.  Meds ordered this encounter  Medications  . DISCONTD: Zoster Vac Recomb Adjuvanted [facility]) injection    Sig: Inject  0.5 mLs into the muscle once.    Dispense:  0.5 mL    Refill:  0  . pravastatin (PRAVACHOL) 80 MG tablet    Sig: Take 0.5 tablets (40 mg total) by mouth daily.    Dispense:  45 tablet    Refill:  0  . Zoster Vac Recomb Adjuvanted (SHINGRIX) injection    Sig: Inject 0.5 mLs into the muscle once.    Dispense:  0.5 mL    Refill:  1    CMA served as scribe during this visit. History, Physical and Plan performed by medical provider. Documentation and orders reviewed and attested to.  Penni Homans, MD

## 2016-07-25 NOTE — Patient Instructions (Addendum)
NOW probiotics daily Add Zyrtec and if no improvement call for trial of singulair  If symptoms worsen to green phlegm with fevers call for possible antibiotic for sinusitis Increase fluid intake, plain Mucinex twice a day, add vitamin C, Elderberry, Zinc, aged or black garlic, call if worsens.  Preventive Care 30 Years and Older, Female Preventive care refers to lifestyle choices and visits with your health care provider that can promote health and wellness. What does preventive care include?  A yearly physical exam. This is also called an annual well check.  Dental exams once or twice a year.  Routine eye exams. Ask your health care provider how often you should have your eyes checked.  Personal lifestyle choices, including:  Daily care of your teeth and gums.  Regular physical activity.  Eating a healthy diet.  Avoiding tobacco and drug use.  Limiting alcohol use.  Practicing safe sex.  Taking low-dose aspirin every day.  Taking vitamin and mineral supplements as recommended by your health care provider. What happens during an annual well check? The services and screenings done by your health care provider during your annual well check will depend on your age, overall health, lifestyle risk factors, and family history of disease. Counseling  Your health care provider may ask you questions about your:  Alcohol use.  Tobacco use.  Drug use.  Emotional well-being.  Home and relationship well-being.  Sexual activity.  Eating habits.  History of falls.  Memory and ability to understand (cognition).  Work and work Statistician.  Reproductive health. Screening  You may have the following tests or measurements:  Height, weight, and BMI.  Blood pressure.  Lipid and cholesterol levels. These may be checked every 5 years, or more frequently if you are over 71 years old.  Skin check.  Lung cancer screening. You may have this screening every year starting at  age 9 if you have a 30-pack-year history of smoking and currently smoke or have quit within the past 15 years.  Fecal occult blood test (FOBT) of the stool. You may have this test every year starting at age 13.  Flexible sigmoidoscopy or colonoscopy. You may have a sigmoidoscopy every 5 years or a colonoscopy every 10 years starting at age 37.  Hepatitis C blood test.  Hepatitis B blood test.  Sexually transmitted disease (STD) testing.  Diabetes screening. This is done by checking your blood sugar (glucose) after you have not eaten for a while (fasting). You may have this done every 1-3 years.  Bone density scan. This is done to screen for osteoporosis. You may have this done starting at age 50.  Mammogram. This may be done every 1-2 years. Talk to your health care provider about how often you should have regular mammograms. Talk with your health care provider about your test results, treatment options, and if necessary, the need for more tests. Vaccines  Your health care provider may recommend certain vaccines, such as:  Influenza vaccine. This is recommended every year.  Tetanus, diphtheria, and acellular pertussis (Tdap, Td) vaccine. You may need a Td booster every 10 years.  Varicella vaccine. You may need this if you have not been vaccinated.  Zoster vaccine. You may need this after age 3.  Measles, mumps, and rubella (MMR) vaccine. You may need at least one dose of MMR if you were born in 1957 or later. You may also need a second dose.  Pneumococcal 13-valent conjugate (PCV13) vaccine. One dose is recommended after age 72.  Pneumococcal polysaccharide (PPSV23) vaccine. One dose is recommended after age 15.  Meningococcal vaccine. You may need this if you have certain conditions.  Hepatitis A vaccine. You may need this if you have certain conditions or if you travel or work in places where you may be exposed to hepatitis A.  Hepatitis B vaccine. You may need this if you  have certain conditions or if you travel or work in places where you may be exposed to hepatitis B.  Haemophilus influenzae type b (Hib) vaccine. You may need this if you have certain conditions. Talk to your health care provider about which screenings and vaccines you need and how often you need them. This information is not intended to replace advice given to you by your health care provider. Make sure you discuss any questions you have with your health care provider. Document Released: 04/17/2015 Document Revised: 12/09/2015 Document Reviewed: 01/20/2015 Elsevier Interactive Patient Education  2017 [employer].

## 2016-07-25 NOTE — Assessment & Plan Note (Signed)
Using CPAP nightly. Not sure if it helps her sleep better

## 2016-07-25 NOTE — Progress Notes (Signed)
Pre visit review using our clinic review tool, if applicable. No additional management support is needed unless otherwise documented below in the visit note. 

## 2016-07-25 NOTE — Assessment & Plan Note (Signed)
Much better with adjustable bed. No need for Cymbalta any longer. Drop her 30 mg cap to qod and then stop if no concerns

## 2016-08-03 ENCOUNTER — Ambulatory Visit (INDEPENDENT_AMBULATORY_CARE_PROVIDER_SITE_OTHER): Payer: Medicare HMO | Admitting: Family Medicine

## 2016-08-03 ENCOUNTER — Other Ambulatory Visit: Payer: Medicare HMO

## 2016-08-03 ENCOUNTER — Encounter: Payer: Self-pay | Admitting: Family Medicine

## 2016-08-03 DIAGNOSIS — M7552 Bursitis of left shoulder: Secondary | ICD-10-CM | POA: Diagnosis not present

## 2016-08-03 DIAGNOSIS — D259 Leiomyoma of uterus, unspecified: Secondary | ICD-10-CM

## 2016-08-03 NOTE — Progress Notes (Signed)
[facility] [address], [place] [address] Phone: [phone] Subjective:    I'm seeing this patient by the request  of: Penni Homans, MD    CC:  Left shoulder   [id number]  Cindy [name] is a 71 y.o. female coming in with complaint of left shoulder pain. Patient found to have more of a subacromial bursitis. Patient was doing 60% better with conservative therapy elements follow-up. Patient will continue with home exercises, icing protocol, and topical anti-inflammatories. Patient states She is now doing 95% better. Still some mild pain when she lifts overhead. Nothing no severe. Able to sleep comfortable he. Working on a regular basis.     Past Medical History:  Diagnosis Date  . Abnormal liver function tests 07/19/2015  . Allergic state 02/01/2010   Qualifier: Diagnosis of  By: Regis Bill MD, Standley Brooking   . Allergy   . Anxiety   . Back pain   . Chicken pox 71 yrs old  . Depressive disorder, not elsewhere classified   . Diabetes ([place])    diet controlled- no medicines   . Fibroid   . History of fractured kneecap    right   . Hyperglycemia   . Knee pain, left 10/05/2011   Follows with Dr Margarette Canada  . Measles as a child  . Medicare annual wellness visit, subsequent 07/06/2014  . OA (osteoarthritis) of knee    left knee  . Obesity 10/05/2011  . Obesity 10/05/2011  . Osteoarthritis   . Osteopenia 06/26/2014  . Other and unspecified hyperlipidemia   . Overweight 10/05/2011  . Overweight(278.02) 10/05/2011  . Sleep apnea     On CPAP  . Sleep apnea   . Stye 11/19/2012  . Thyroid disease    h/o hyperthyroid treated with radioactive iodine? from 16 to 20  . Type 2 diabetes mellitus with hyperlipidemia ([place])   . Varicose vein of leg    Past Surgical History:  Procedure Laterality Date  . BACK SURGERY     L5 discectomy, 2002. helpful  . CERVICAL BIOPSY  W/ LOOP ELECTRODE EXCISION    . CESAREAN SECTION  1987  . COLONOSCOPY    . CPAP    . KNEE  SURGERY Right 2006   R knee fx. patella  . LEEP     Social History   Social History  . Marital status: Divorced    Spouse name: N/A  . Number of children: N/A  . Years of education: N/A   Social History Main Topics  . Smoking status: Never Smoker  . Smokeless tobacco: Never Used  . Alcohol use 0.6 oz/week    1 Glasses of wine per week  . Drug use: No  . Sexual activity: No   Other Topics Concern  . None   Social History Narrative   Recently divorced separated fall 2012    Now living with friend in [place] area .   Never smoker   Regular exercise   Sleep 01/12/29 up at 6; job hours irreg             Allergies  Allergen Reactions  . Codeine Other (See Comments)    hallucinations  . Crestor [Rosuvastatin]     REACTION: myalgias  . Other Other (See Comments)    NOVACAINE- blisters left side of mouth after receiving   . Amoxicillin Rash  . Erythromycin Other (See Comments)    cramps  . Sulfonamide Derivatives Rash   Family History  Problem Relation Age of Onset  . Hyperlipidemia Mother   . Dementia Mother   . Hyperlipidemia Father   . Heart disease Father     bypass, CAD  . Hyperlipidemia Sister   . Diabetes Sister     type 2  . Colon polyps Sister   . Dementia Maternal Grandmother     alzheimer  . Alcohol abuse Maternal Grandfather   . Diabetes Paternal Grandmother     type 2?  . Colon cancer Neg Hx   . Esophageal cancer Neg Hx   . Rectal cancer Neg Hx   . Stomach cancer Neg Hx     Past medical history, social, surgical and family history all reviewed in electronic medical record.  No pertanent information unless stated regarding to the chief complaint.   Review of Systems: No headache, visual changes, nausea, vomiting, diarrhea, constipation, dizziness, abdominal pain, skin rash, fevers, chills, night sweats, weight loss, swollen lymph nodes, body aches, joint swelling, muscle aches, chest pain, shortness of breath, mood changes.    Objective    Blood pressure (!) 142/66, pulse 60, resp. rate 16, weight 171 lb 2 oz (77.6 kg), last menstrual period 04/04/2000, SpO2 98 %.   Systems examined below as of 08/03/16 General: NAD A&O x3 mood, affect normal  HEENT: Pupils equal, extraocular movements intact no nystagmus Respiratory: not short of breath at rest or with speaking Cardiovascular: No lower extremity edema, non tender Skin: Warm dry intact with no signs of infection or rash on extremities or on axial skeleton. Abdomen: Soft nontender, no masses Neuro: Cranial nerves  intact, neurovascularly intact in all extremities with 2+ DTRs and 2+ pulses. Lymph: No lymphadenopathy appreciated today  Gait normal with good balance and coordination.  MSK: Non tender with full range of motion and good stability and symmetric strength and tone of  elbows, wrist,  knee hips and ankles bilaterally.   Shoulder: Left Inspection reveals no abnormalities, atrophy or asymmetry. Palpation is normal with no tenderness over AC joint or bicipital groove. ROM is full in all planes. Rotator cuff strength normal throughout. Very minimal impingement still remaining Speeds and Yergason's tests normal. No labral pathology noted with negative Obrien's, negative clunk and good stability. Normal scapular function observed. No painful arc and no drop arm sign. No apprehension sign          Impression and Recommendations:     This case required medical decision making of moderate complexity.      Note: This dictation was prepared with Dragon dictation along with smaller phrase technology. Any transcriptional errors that result from this process are unintentional.

## 2016-08-03 NOTE — Patient Instructions (Addendum)
Good to see you  Alvera Singh is your friend.  I am impressed overall and you should do well.  Continue the vitamins  Cut back on my exercises to 1-2 times a week and can increase other activity  Keep hands within peripheral vision  See me again in 2 months if not all the way there!  Have a fun trip

## 2016-08-03 NOTE — Progress Notes (Signed)
Pre-visit discussion using our clinic review tool. No additional management support is needed unless otherwise documented below in the visit note.  

## 2016-08-03 NOTE — Assessment & Plan Note (Signed)
We will with conservative therapy. We discussed icing regimen and home exercises. We discussed objective is a doing which ones to potentially avoid. Patient will come back and see me again in 2 months if still having pain

## 2016-08-05 ENCOUNTER — Other Ambulatory Visit (INDEPENDENT_AMBULATORY_CARE_PROVIDER_SITE_OTHER): Payer: Medicare HMO

## 2016-08-05 DIAGNOSIS — D259 Leiomyoma of uterus, unspecified: Secondary | ICD-10-CM

## 2016-08-05 LAB — LIPID PANEL
Cholesterol: 230 mg/dL — ABNORMAL HIGH (ref 0–200)
HDL: 58 mg/dL (ref >39.00)
LDL CALC: 152 mg/dL — AB (ref 0–99)
NONHDL: 172.16
Total CHOL/HDL Ratio: 4
Triglycerides: 100 mg/dL (ref 0.0–149.0)
VLDL: 20 mg/dL (ref 0.0–40.0)

## 2016-08-05 LAB — COMPREHENSIVE METABOLIC PANEL
ALT: 18 U/L (ref 0–35)
AST: 14 U/L (ref 0–37)
Albumin: 3.9 g/dL (ref 3.5–5.2)
Alkaline Phosphatase: 75 U/L (ref 39–117)
BUN: 21 mg/dL (ref 6–23)
CHLORIDE: 104 meq/L (ref 96–112)
CO2: 28 mEq/L (ref 19–32)
Calcium: 9.7 mg/dL (ref 8.4–10.5)
Creatinine, Ser: 0.86 mg/dL (ref 0.40–1.20)
GFR: 69.53 mL/min (ref >60.00)
GLUCOSE: 112 mg/dL — AB (ref 70–99)
POTASSIUM: 4.8 meq/L (ref 3.5–5.1)
SODIUM: 139 meq/L (ref 135–145)
Total Bilirubin: 0.5 mg/dL (ref 0.2–1.2)
Total Protein: 6.8 g/dL (ref 6.0–8.3)

## 2016-08-05 LAB — CBC WITH DIFFERENTIAL/PLATELET
BASOS PCT: 0.4 % (ref 0.0–3.0)
Basophils Absolute: 0 10*3/uL (ref 0.0–0.1)
EOS PCT: 1.8 % (ref 0.0–5.0)
Eosinophils Absolute: 0.1 10*3/uL (ref 0.0–0.7)
HCT: 41.5 % (ref 36.0–46.0)
Hemoglobin: 14 g/dL (ref 12.0–15.0)
LYMPHS ABS: 2.7 10*3/uL (ref 0.7–4.0)
Lymphocytes Relative: 36.4 % (ref 12.0–46.0)
MCHC: 33.6 g/dL (ref 30.0–36.0)
MCV: 91 fl (ref 78.0–100.0)
MONO ABS: 0.7 10*3/uL (ref 0.1–1.0)
Monocytes Relative: 9 % (ref 3.0–12.0)
NEUTROS ABS: 3.9 10*3/uL (ref 1.4–7.7)
NEUTROS PCT: 52.4 % (ref 43.0–77.0)
PLATELETS: 308 10*3/uL (ref 150.0–400.0)
RBC: 4.56 Mil/uL (ref 3.87–5.11)
RDW: 14 % (ref 11.5–15.5)
WBC: 7.4 10*3/uL (ref 4.0–10.5)

## 2016-08-05 LAB — HEMOGLOBIN A1C: HEMOGLOBIN A1C: 6.1 % (ref 4.6–6.5)

## 2016-08-05 LAB — TSH: TSH: 1.21 u[IU]/mL (ref 0.35–4.50)

## 2016-08-05 LAB — ALBUMIN: Albumin: 3.9 g/dL (ref 3.5–5.2)

## 2016-08-12 DIAGNOSIS — G4733 Obstructive sleep apnea (adult) (pediatric): Secondary | ICD-10-CM | POA: Diagnosis not present

## 2016-08-23 ENCOUNTER — Other Ambulatory Visit: Payer: Self-pay | Admitting: Family Medicine

## 2016-09-06 ENCOUNTER — Ambulatory Visit: Payer: Medicare HMO | Admitting: Podiatry

## 2016-09-22 ENCOUNTER — Encounter: Payer: Self-pay | Admitting: Podiatry

## 2016-09-22 ENCOUNTER — Ambulatory Visit (INDEPENDENT_AMBULATORY_CARE_PROVIDER_SITE_OTHER): Payer: Medicare HMO | Admitting: Podiatry

## 2016-09-22 DIAGNOSIS — M21622 Bunionette of left foot: Secondary | ICD-10-CM

## 2016-09-22 DIAGNOSIS — Q828 Other specified congenital malformations of skin: Secondary | ICD-10-CM

## 2016-09-22 DIAGNOSIS — M779 Enthesopathy, unspecified: Secondary | ICD-10-CM

## 2016-09-22 MED ORDER — TRIAMCINOLONE ACETONIDE 10 MG/ML IJ SUSP
10.0000 mg | Freq: Once | INTRAMUSCULAR | Status: AC
  Administered 2016-09-22: 10 mg

## 2016-09-22 NOTE — Progress Notes (Signed)
Subjective:    Patient ID: Cindy [name], female   DOB: 71 y.o.   MRN: [id number]   HPI patient presents stating that she's had a lot of pain around the fifth metatarsal head with lesion formation that's been present around:    Review of Systems  All other systems reviewed and are negative.       Objective:  Physical Exam  Cardiovascular: Intact distal pulses.   Musculoskeletal: Normal range of motion.  Neurological: She is alert.  Skin: Skin is warm.  Nursing note and vitals reviewed.  neurovascular status intact muscle strength adequate range of motion within normal limits with patient found to have lesion underneath the fifth metatarsal head left with fluid buildup around it that's painful when pressed. Patient has inflammatory changes noted     Assessment:  Plantar flexed fifth metatarsal left with inflammatory capsulitis around the fifth MPJ       Plan:    H&P condition reviewed and careful plantar injection administered 3 mg Dexon some Kenalog 5 mill grams Xylocaine and deep debridement of lesion accomplished

## 2016-09-22 NOTE — Progress Notes (Signed)
   Subjective:    Patient ID: Cindy [name], female    DOB: 10-Nov-1945, 71 y.o.   MRN: [id number]  HPI  Chief Complaint  Patient presents with  . Callouses    Lt foot 5th met have hard skin for about 2 months     Review of Systems  Skin: Positive for color change.       Objective:   Physical Exam        Assessment & Plan:

## 2016-09-27 DIAGNOSIS — M9911 Subluxation complex (vertebral) of cervical region: Secondary | ICD-10-CM | POA: Diagnosis not present

## 2016-09-27 DIAGNOSIS — M9903 Segmental and somatic dysfunction of lumbar region: Secondary | ICD-10-CM | POA: Diagnosis not present

## 2016-09-27 DIAGNOSIS — M9912 Subluxation complex (vertebral) of thoracic region: Secondary | ICD-10-CM | POA: Diagnosis not present

## 2016-10-03 ENCOUNTER — Ambulatory Visit: Payer: Medicare HMO | Admitting: Family Medicine

## 2016-10-04 ENCOUNTER — Encounter (HOSPITAL_COMMUNITY): Payer: Self-pay | Admitting: Emergency Medicine

## 2016-10-04 ENCOUNTER — Emergency Department (HOSPITAL_COMMUNITY)
Admission: EM | Admit: 2016-10-04 | Discharge: 2016-10-04 | Disposition: A | Discharge status: Home / Self Care | Payer: Medicare HMO | Attending: Emergency Medicine | Admitting: Emergency Medicine

## 2016-10-04 DIAGNOSIS — F419 Anxiety disorder, unspecified: Secondary | ICD-10-CM | POA: Diagnosis not present

## 2016-10-04 DIAGNOSIS — S91011A Laceration without foreign body, right ankle, initial encounter: Secondary | ICD-10-CM

## 2016-10-04 DIAGNOSIS — Y998 Other external cause status: Secondary | ICD-10-CM | POA: Insufficient documentation

## 2016-10-04 DIAGNOSIS — Y939 Activity, unspecified: Secondary | ICD-10-CM | POA: Insufficient documentation

## 2016-10-04 DIAGNOSIS — R69 Illness, unspecified: Secondary | ICD-10-CM | POA: Diagnosis not present

## 2016-10-04 DIAGNOSIS — Y929 Unspecified place or not applicable: Secondary | ICD-10-CM | POA: Diagnosis not present

## 2016-10-04 DIAGNOSIS — W260XXA Contact with knife, initial encounter: Secondary | ICD-10-CM | POA: Diagnosis not present

## 2016-10-04 DIAGNOSIS — E119 Type 2 diabetes mellitus without complications: Secondary | ICD-10-CM | POA: Diagnosis not present

## 2016-10-04 MED ORDER — ACETAMINOPHEN 325 MG PO TABS
650.0000 mg | ORAL_TABLET | Freq: Once | ORAL | Status: AC
  Administered 2016-10-04: 650 mg via ORAL
  Filled 2016-10-04: qty 2

## 2016-10-04 NOTE — ED Notes (Signed)
See EDP secondary assessment.  

## 2016-10-04 NOTE — Discharge Instructions (Signed)
Please have your stitches removed in 8-10 days. You can return to the emergency department urgent care, or follow-up with your family doctor for removal. You can clean the area with warm soap and water. Please change your dressing at least once a day. Continue to keep the extremity elevated at home above the level of your heart when possible. You can take Tylenol or ibuprofen for pain control. If he develop new or worsening symptoms, including fever, chills, or if the skin around the wound becomes red, hot, or swollen, please return to the emergency department for reevaluation.

## 2016-10-04 NOTE — ED Notes (Signed)
No answer when name called for triage

## 2016-10-04 NOTE — ED Provider Notes (Signed)
[facility] DEPT Provider Note   CSN: [id number] Arrival date & time: 10/04/16  1124  By signing my name below, I, Cindy [name], attest that this documentation has been prepared under the direction and in the presence of Cindy Buschman, PA-C. Electronically Signed: Mayer [name], Scribe. 10/04/16. 12:56 PM.  History   Chief Complaint Chief Complaint  Patient presents with  . Ankle Pain  . Laceration   The history is provided by the patient. No language interpreter was used.    HPI Comments: Cindy [name] is a 71 y.o. female who presents to the Emergency Department complaining of constant, gradually worsening right-sided foot pain that occurred prior to arrival. She states a knife fell from the counter and onto her foot, which cut her. Wound is not hemostatic. She notes she was using the knife to cut steak before the accident and did not fall or trip. She denies use of blood thinners.  No PMHx of DM. She does note having varicose veins in bilateral feet. Tetanus is UTD.  Past Medical History:  Diagnosis Date  . Abnormal liver function tests 07/19/2015  . Allergic state 02/01/2010   Qualifier: Diagnosis of  By: Regis Bill MD, Standley Brooking   . Allergy   . Anxiety   . Back pain   . Chicken pox 71 yrs old  . Depressive disorder, not elsewhere classified   . Diabetes ([place])    diet controlled- no medicines   . Fibroid   . History of fractured kneecap    right   . Hyperglycemia   . Knee pain, left 10/05/2011   Follows with Dr Margarette Canada  . Measles as a child  . Medicare annual wellness visit, subsequent 07/06/2014  . OA (osteoarthritis) of knee    left knee  . Obesity 10/05/2011  . Obesity 10/05/2011  . Osteoarthritis   . Osteopenia 06/26/2014  . Other and unspecified hyperlipidemia   . Overweight 10/05/2011  . Overweight(278.02) 10/05/2011  . Sleep apnea     On CPAP  . Sleep apnea   . Stye 11/19/2012  . Thyroid disease    h/o hyperthyroid treated with radioactive iodine? from 16 to 20  . Type 2  diabetes mellitus with hyperlipidemia ([place])   . Varicose vein of leg     Patient Active Problem List   Diagnosis Date Noted  . Acute bursitis of left shoulder 05/19/2016  . Mass of ankle, right 02/21/2016  . Abnormal liver function tests 07/19/2015  . Carpal tunnel syndrome 12/04/2014  . Medicare annual wellness visit, subsequent 07/06/2014  . Sinusitis 11/16/2011  . Obesity 10/05/2011  . Knee pain, left 10/05/2011  . Type 2 diabetes mellitus with hyperlipidemia ([place])   . Back pain   . Varicose vein of leg   . Preventative health care 07/29/2011  . Allergic state 02/01/2010  . FATIGUE 06/29/2009  . TINGLING 01/27/2009  . CHEST DISCOMFORT, ATYPICAL 01/27/2009  . Obstructive sleep apnea 03/05/2007  . Disorder of bone and cartilage 12/29/2006  . INCREASED BLOOD PRESSURE 10/30/2006  . FIBROIDS, UTERUS 09/22/2006  . Hyperlipidemia, mixed 09/22/2006  . DEPRESSION 09/22/2006  . OSTEOARTHRITIS 09/22/2006    Past Surgical History:  Procedure Laterality Date  . BACK SURGERY     L5 discectomy, 2002. helpful  . CERVICAL BIOPSY  W/ LOOP ELECTRODE EXCISION    . CESAREAN SECTION  1987  . COLONOSCOPY    . CPAP    . KNEE SURGERY Right 2006   R knee fx. patella  . LEEP  OB History    Gravida Para Term Preterm AB Living   2 1 1   1 1    SAB TAB Ectopic Multiple Live Births   1       1       Home Medications    Prior to Admission medications   Medication Sig Start Date End Date Taking? Authorizing Provider  cholecalciferol (VITAMIN D) 1000 units tablet Take 1,000 Units by mouth daily.    [provider]  cycloSPORINE (RESTASIS) 0.05 % ophthalmic emulsion Place 1 drop into both eyes daily as needed. Reported on 07/09/2015    [provider]  DULoxetine (CYMBALTA) 30 MG capsule TAKE ONE CAPSULE BY MOUTH DAILY 08/23/16   Mosie Lukes, MD  fluticasone (FLONASE) 50 MCG/ACT nasal spray Place 1 spray into both nostrils daily.    [provider]    levocetirizine (XYZAL) 5 MG tablet Take 5 mg by mouth as needed.  12/05/14   [provider]  pravastatin (PRAVACHOL) 80 MG tablet Take 0.5 tablets (40 mg total) by mouth daily. 07/25/16   Mosie Lukes, MD  Turmeric 500 MG CAPS Take by mouth.    [provider]    Family History Family History  Problem Relation Age of Onset  . Hyperlipidemia Mother   . Dementia Mother   . Hyperlipidemia Father   . Heart disease Father        bypass, CAD  . Hyperlipidemia Sister   . Diabetes Sister        type 2  . Colon polyps Sister   . Dementia Maternal Grandmother        alzheimer  . Alcohol abuse Maternal Grandfather   . Diabetes Paternal Grandmother        type 2?  . Colon cancer Neg Hx   . Esophageal cancer Neg Hx   . Rectal cancer Neg Hx   . Stomach cancer Neg Hx     Social History Social History  Substance Use Topics  . Smoking status: Never Smoker  . Smokeless tobacco: Never Used  . Alcohol use 0.6 oz/week    1 Glasses of wine per week     Allergies   Codeine; Crestor [rosuvastatin]; Other; Amoxicillin; Erythromycin; and Sulfonamide derivatives   Review of Systems Review of Systems  Constitutional: Negative for fever.  Skin: Positive for wound.   Physical Exam Updated Vital Signs BP (!) 145/91 (BP Location: Left Arm)   Pulse 75   Temp 97.6 F (36.4 C) (Oral)   Resp 16   Ht 5' 5.25" (1.657 m)   Wt 77.6 kg (171 lb)   LMP 04/04/2000   SpO2 95%   BMI 28.24 kg/m   Physical Exam  Constitutional: No distress.  HENT:  Head: Normocephalic.  Eyes: Conjunctivae are normal.  Neck: Neck supple.  Cardiovascular: Normal rate and regular rhythm.  Exam reveals no gallop and no friction rub.   No murmur heard. Pulmonary/Chest: Effort normal. No respiratory distress.  Abdominal: Soft. She exhibits no distension.  Neurological: She is alert.  Skin: Skin is warm. No rash noted.  1.5 cm laceration to the lateral aspect of the right ankle Bleeding is  controlled with pressure, no obvious foreign bodies 2+ dp/pt pulses  Psychiatric: Her behavior is normal.  Nursing note and vitals reviewed.  ED Treatments / Results  DIAGNOSTIC STUDIES: Oxygen Saturation is 95% on RA, normal by my interpretation.    COORDINATION OF CARE: 12:55 PM Discussed treatment plan with pt at  bedside and pt agreed to plan.  Labs (all labs ordered are listed, but only abnormal results are displayed) Labs Reviewed - No data to display  EKG  EKG Interpretation None       Radiology No results found.  Procedures Procedures (including critical care time)  Medications Ordered in ED Medications  acetaminophen (TYLENOL) tablet 650 mg (650 mg Oral Given 10/04/16 1412)     Initial Impression / Assessment and Plan / ED Course  I have reviewed the triage vital signs and the nursing notes.  Pertinent labs & imaging results that were available during my care of the patient were reviewed by me and considered in my medical decision making (see chart for details).     Patient presenting with a laceration to the right ankle. Please see Dr. Virl Cagey procedure note for full repair of the wound. She was supervised by Dr. Eulis Foster, attending physician. Tdap was UTD.  Pt has no comorbidities to effect normal wound healing. Patient was observed in the emergency department for 20 minutes following the procedure to ensure no hematoma formation. Pt discharged without antibiotics.  Discussed suture home care with patient and answered questions. Pt to follow-up for wound check and suture removal in 8-10 days; they are to return to the ED sooner for signs of infection. Pt is hemodynamically stable with no complaints prior to dc.   Final Clinical Impressions(s) / ED Diagnoses   Final diagnoses:  Laceration of right ankle, initial encounter    New Prescriptions Discharge Medication List as of 10/04/2016  1:58 PM    I personally performed the services described in this  documentation, which was scribed in my presence. The recorded information has been reviewed and is accurate.     Joline Maxcy A, PA-C 10/04/16 1844    Daleen Bo, MD 10/05/16 [phone]

## 2016-10-04 NOTE — ED Triage Notes (Signed)
Pt. Stated, I was cleaning and a knife fell off the counter and hit my ankle. It was bleeding so bad I call the paramedics.  They said I need to come here.

## 2016-10-04 NOTE — ED Provider Notes (Signed)
  Face-to-face evaluation   History: As tolerated injury right ankle secondary to knife falling off of counter.  No other injury.  Physical exam: Alert elderly female.  Right ankle with lateral superficial laceration.  Neurovascularly and motion intact distally.  Patient evaluated with resident, and procedure supervised.   Medical screening examination/treatment/procedure(s) were conducted as a shared visit with non-physician practitioner(s) and myself.  I personally evaluated the patient during the encounter    Daleen Bo, MD 10/05/16 [phone]

## 2016-10-04 NOTE — ED Provider Notes (Signed)
  [facility] DEPT Provider Note   CSN: [id number] Arrival date & time: 10/04/16  1124  Laceration Procedure Note  .Marland KitchenLaceration Repair Date/Time: 10/04/2016 1:38 PM Performed by: Tonette Bihari Authorized by: Daleen Bo   Consent:    Consent obtained:  Verbal   Consent given by:  Patient   Alternatives discussed:  No treatment Anesthesia (see MAR for exact dosages):    Anesthesia method:  Local infiltration   Local anesthetic:  Lidocaine 2% WITH epi Laceration details:    Location: right ankle    Length (cm):  2.5   Depth (mm):  2 Repair type:    Repair type:  Simple Exploration:    Hemostasis achieved with:  Epinephrine Treatment:    Area cleansed with:  Betadine   Amount of cleaning:  Standard Skin repair:    Repair method:  Sutures   Suture size:  5-0   Suture material:  Prolene   Suture technique:  Simple interrupted Approximation:    Approximation:  Close   Vermilion border: well-aligned   Post-procedure details:    Dressing:  Antibiotic ointment   Patient tolerance of procedure:  Tolerated well, no immediate complications   Rechecked on patient 10 minutes later to ensure no development of hematoma as patient initially had brisk bleeding prior to laceration repair     Tonette Bihari, MD 10/04/16 1344    Daleen Bo, MD 10/05/16 [phone]

## 2016-10-12 DIAGNOSIS — Z5189 Encounter for other specified aftercare: Secondary | ICD-10-CM | POA: Diagnosis not present

## 2016-10-20 DIAGNOSIS — Z4802 Encounter for removal of sutures: Secondary | ICD-10-CM | POA: Diagnosis not present

## 2016-10-27 ENCOUNTER — Other Ambulatory Visit: Payer: Self-pay | Admitting: Family Medicine

## 2016-10-27 ENCOUNTER — Encounter (INDEPENDENT_AMBULATORY_CARE_PROVIDER_SITE_OTHER): Payer: Medicare HMO | Admitting: Family Medicine

## 2016-11-07 ENCOUNTER — Encounter (INDEPENDENT_AMBULATORY_CARE_PROVIDER_SITE_OTHER): Payer: Medicare HMO | Admitting: Family Medicine

## 2016-11-07 DIAGNOSIS — Z0289 Encounter for other administrative examinations: Secondary | ICD-10-CM

## 2016-11-08 NOTE — Progress Notes (Signed)
Pt BMI below 30, not qualified for our program

## 2016-11-15 DIAGNOSIS — G4733 Obstructive sleep apnea (adult) (pediatric): Secondary | ICD-10-CM | POA: Diagnosis not present

## 2016-11-18 ENCOUNTER — Telehealth: Payer: Self-pay | Admitting: Internal Medicine

## 2016-11-18 NOTE — Telephone Encounter (Signed)
atc Sheena at [employer], line was answered by [facility].  Deysha transferred me to [facility] line, where  I was on hold for several minutes with no answer, no vm.    Wcb.  (clinical staff please note--pt has not been seen in a year, no orders from our office stating pt's cpap pressure.  Most recent pressure documented in an OV note I can find is 9cm)

## 2016-11-21 DIAGNOSIS — G4733 Obstructive sleep apnea (adult) (pediatric): Secondary | ICD-10-CM | POA: Diagnosis not present

## 2016-11-21 DIAGNOSIS — H524 Presbyopia: Secondary | ICD-10-CM | POA: Diagnosis not present

## 2016-11-21 NOTE — Telephone Encounter (Signed)
I have spoken with Cindy [name] at [employer]. Cindy [name] is aware that our office hasn't ordered a pressure setting, however according to OV note it appears pt it on set pressure of 9cm. Sheena voiced her understanding. Nothing further needed.

## 2016-12-15 DIAGNOSIS — S069X1A Unspecified intracranial injury with loss of consciousness of 30 minutes or less, initial encounter: Secondary | ICD-10-CM | POA: Diagnosis not present

## 2016-12-15 DIAGNOSIS — W010XXA Fall on same level from slipping, tripping and stumbling without subsequent striking against object, initial encounter: Secondary | ICD-10-CM | POA: Diagnosis not present

## 2016-12-15 DIAGNOSIS — R55 Syncope and collapse: Secondary | ICD-10-CM | POA: Diagnosis not present

## 2016-12-15 DIAGNOSIS — S0990XA Unspecified injury of head, initial encounter: Secondary | ICD-10-CM | POA: Diagnosis not present

## 2016-12-22 DIAGNOSIS — G4733 Obstructive sleep apnea (adult) (pediatric): Secondary | ICD-10-CM | POA: Diagnosis not present

## 2016-12-24 ENCOUNTER — Other Ambulatory Visit: Payer: Self-pay | Admitting: Family Medicine

## 2016-12-26 ENCOUNTER — Encounter (INDEPENDENT_AMBULATORY_CARE_PROVIDER_SITE_OTHER): Payer: Self-pay

## 2016-12-26 ENCOUNTER — Ambulatory Visit (INDEPENDENT_AMBULATORY_CARE_PROVIDER_SITE_OTHER): Payer: Medicare HMO | Admitting: Family Medicine

## 2017-01-03 ENCOUNTER — Encounter: Payer: Self-pay | Admitting: Certified Nurse Midwife

## 2017-01-03 ENCOUNTER — Ambulatory Visit (INDEPENDENT_AMBULATORY_CARE_PROVIDER_SITE_OTHER): Payer: Medicare HMO | Admitting: Certified Nurse Midwife

## 2017-01-03 ENCOUNTER — Other Ambulatory Visit (HOSPITAL_COMMUNITY)
Admission: RE | Admit: 2017-01-03 | Discharge: 2017-01-03 | Disposition: A | Discharge status: Home / Self Care | Payer: Medicare HMO | Source: Ambulatory Visit | Attending: Obstetrics & Gynecology | Admitting: Obstetrics & Gynecology

## 2017-01-03 VITALS — BP 120/78 | HR 70 | Resp 16 | Ht 64.75 in | Wt 184.0 lb

## 2017-01-03 DIAGNOSIS — Z124 Encounter for screening for malignant neoplasm of cervix: Secondary | ICD-10-CM | POA: Insufficient documentation

## 2017-01-03 DIAGNOSIS — Z01419 Encounter for gynecological examination (general) (routine) without abnormal findings: Secondary | ICD-10-CM | POA: Diagnosis not present

## 2017-01-03 DIAGNOSIS — N951 Menopausal and female climacteric states: Secondary | ICD-10-CM

## 2017-01-03 DIAGNOSIS — Z87898 Personal history of other specified conditions: Secondary | ICD-10-CM

## 2017-01-03 DIAGNOSIS — Z8742 Personal history of other diseases of the female genital tract: Secondary | ICD-10-CM

## 2017-01-03 NOTE — Patient Instructions (Signed)

## 2017-01-03 NOTE — Progress Notes (Signed)
71 y.o. G72P1011 Divorced  Caucasian Fe here for annual exam. Menopausal using  Coconut oil for vaginal dryness, working well. Denies vaginal bleeding. Sees Dr. Randel Pigg twice yearly, with labs and medication management of cholesterol,diabetes and anxiety. Will be enrolled in dietary management for weight loss maintenance with [facility]. Has been traveling all over the [name] and in [place] all year, ready to home now. No health issues today.  Patient's last menstrual period was 04/04/2000.          Sexually active: No.  The current method of family planning is post menopausal status.    Exercising: Yes.    curves & zumba Smoker:  no  Health Maintenance: Pap:  12-26-14 neg History of Abnormal Pap: yes, LEEP MMG:  03-11-16 category b density birads 1:neg Self Breast exams: occ Colonoscopy:  1/18 f/u 55yrs BMD:   2016 osteopenia, PCP manages TDaP:  2014 Shingles: 2013 Pneumonia: 2016, 2018 Hep C and HIV: Hep c neg 2017, HIV done yrs ago Labs: as needed   reports that she has never smoked. She has never used smokeless tobacco. She reports that she drinks about 0.6 oz of alcohol per week . She reports that she does not use drugs.  Past Medical History:  Diagnosis Date  . Abnormal liver function tests 07/19/2015  . Allergic state 02/01/2010   Qualifier: Diagnosis of  By: Regis Bill MD, Standley Brooking   . Allergy   . Anxiety   . Back pain   . Chicken pox 71 yrs old  . Depressive disorder, not elsewhere classified   . Diabetes ([place])    diet controlled- no medicines   . Fibroid   . History of fractured kneecap    right   . Hyperglycemia   . Knee pain, left 10/05/2011   Follows with Dr Margarette Canada  . Measles as a child  . Medicare annual wellness visit, subsequent 07/06/2014  . OA (osteoarthritis) of knee    left knee  . Obesity 10/05/2011  . Obesity 10/05/2011  . Osteoarthritis   . Osteopenia 06/26/2014  . Other and unspecified hyperlipidemia   . Overweight 10/05/2011  . Overweight(278.02) 10/05/2011  .  Sleep apnea     On CPAP  . Sleep apnea   . Stye 11/19/2012  . Thyroid disease    h/o hyperthyroid treated with radioactive iodine? from 16 to 20  . Type 2 diabetes mellitus with hyperlipidemia ([place])   . Varicose vein of leg     Past Surgical History:  Procedure Laterality Date  . BACK SURGERY     L5 discectomy, 2002. helpful  . CERVICAL BIOPSY  W/ LOOP ELECTRODE EXCISION    . CESAREAN SECTION  1987  . COLONOSCOPY    . CPAP    . KNEE SURGERY Right 2006   R knee fx. patella  . LEEP      Current Outpatient Prescriptions  Medication Sig Dispense Refill  . cholecalciferol (VITAMIN D) 1000 units tablet Take 1,000 Units by mouth daily.    . cycloSPORINE (RESTASIS) 0.05 % ophthalmic emulsion Place 1 drop into both eyes daily as needed. Reported on 07/09/2015    . DULoxetine (CYMBALTA) 30 MG capsule TAKE ONE CAPSULE BY MOUTH DAILY 30 capsule 0  . fluticasone (FLONASE) 50 MCG/ACT nasal spray Place 1 spray into both nostrils daily.    Marland Kitchen levocetirizine (XYZAL) 5 MG tablet Take 5 mg by mouth as needed.   3  . pravastatin (PRAVACHOL) 80 MG tablet Take 0.5 tablets (40 mg total)  by mouth daily. 45 tablet 0  . Turmeric 500 MG CAPS Take by mouth.     Current Facility-Administered Medications  Medication Dose Route Frequency Provider Last Rate Last Dose  . 0.9 %  sodium chloride infusion  500 mL Intravenous Continuous Ladene Artist, MD        Family History  Problem Relation Age of Onset  . Hyperlipidemia Mother   . Dementia Mother   . Hyperlipidemia Father   . Heart disease Father        bypass, CAD  . Hyperlipidemia Sister   . Diabetes Sister        type 2  . Colon polyps Sister   . Dementia Maternal Grandmother        alzheimer  . Alcohol abuse Maternal Grandfather   . Diabetes Paternal Grandmother        type 2?  . Colon cancer Neg Hx   . Esophageal cancer Neg Hx   . Rectal cancer Neg Hx   . Stomach cancer Neg Hx     ROS:  Pertinent items are noted in HPI.  Otherwise, a  comprehensive ROS was negative.  Exam:   LMP 04/04/2000    Ht Readings from Last 3 Encounters:  11/07/16 5\' 5"  (1.651 m)  10/04/16 5' 5.25" (1.657 m)  07/25/16 5\' 5"  (1.651 m)    General appearance: alert, cooperative and appears stated age Head: Normocephalic, without obvious abnormality, atraumatic Neck: no adenopathy, supple, symmetrical, trachea midline and thyroid normal to inspection and palpation Lungs: clear to auscultation bilaterally Breasts: normal appearance, no masses or tenderness, No nipple retraction or dimpling, No nipple discharge or bleeding, No axillary or supraclavicular adenopathy Heart: regular rate and rhythm Abdomen: soft, non-tender; no masses,  no organomegaly Extremities: extremities normal, atraumatic, no cyanosis or edema Skin: Skin color, texture, turgor normal. No rashes or lesions Lymph nodes: Cervical, supraclavicular, and axillary nodes normal. No abnormal inguinal nodes palpated Neurologic: Grossly normal   Pelvic: External genitalia:  no lesions              Urethra:  normal appearing urethra with no masses, tenderness or lesions              Bartholin's and Skene's: normal                 Vagina: normal appearing vagina with normal color and discharge, no lesions              Cervix: no cervical motion tenderness, no lesions and normal appearance              Pap taken: Yes.   Bimanual Exam:  Uterus:  normal size, contour, position, consistency, mobility, non-tender              Adnexa: normal adnexa and no mass, fullness, tenderness               Rectovaginal: Confirms               Anus:  normal sphincter tone, no lesions  Chaperone present: yes  A:  Well Woman with normal exam  Menopausal no HRT  Vaginal dryness coconut oil working well  History of abnormal pap smear with LEEP  Cholesterol/anxiety, vitamin D/osteopenia management with PCP  P:   Reviewed health and wellness pertinent to exam  Aware of need to evaluate if vaginal  bleeding  Will advise if issues  Continue with PCP as indicated  Pap smear: yes   counseled  on breast self exam, mammography screening, adequate intake of calcium and vitamin D, diet and exercise, Kegel's exercises  return annually or prn  An After Visit Summary was printed and given to the patient.

## 2017-01-06 LAB — CYTOLOGY - PAP: DIAGNOSIS: NEGATIVE

## 2017-01-10 ENCOUNTER — Encounter (INDEPENDENT_AMBULATORY_CARE_PROVIDER_SITE_OTHER): Payer: Self-pay | Admitting: Family Medicine

## 2017-01-10 ENCOUNTER — Ambulatory Visit (INDEPENDENT_AMBULATORY_CARE_PROVIDER_SITE_OTHER): Payer: Medicare HMO | Admitting: Family Medicine

## 2017-01-10 VITALS — BP 137/63 | HR 60 | Temp 97.4°F | Ht 65.0 in | Wt 185.0 lb

## 2017-01-10 DIAGNOSIS — E559 Vitamin D deficiency, unspecified: Secondary | ICD-10-CM | POA: Diagnosis not present

## 2017-01-10 DIAGNOSIS — R5383 Other fatigue: Secondary | ICD-10-CM | POA: Diagnosis not present

## 2017-01-10 DIAGNOSIS — Z1331 Encounter for screening for depression: Secondary | ICD-10-CM

## 2017-01-10 DIAGNOSIS — R7303 Prediabetes: Secondary | ICD-10-CM

## 2017-01-10 DIAGNOSIS — E669 Obesity, unspecified: Secondary | ICD-10-CM

## 2017-01-10 DIAGNOSIS — Z6830 Body mass index (BMI) 30.0-30.9, adult: Secondary | ICD-10-CM

## 2017-01-10 DIAGNOSIS — E7849 Other hyperlipidemia: Secondary | ICD-10-CM | POA: Diagnosis not present

## 2017-01-10 DIAGNOSIS — R0602 Shortness of breath: Secondary | ICD-10-CM | POA: Diagnosis not present

## 2017-01-11 ENCOUNTER — Telehealth (INDEPENDENT_AMBULATORY_CARE_PROVIDER_SITE_OTHER): Payer: Self-pay | Admitting: Dietician

## 2017-01-11 LAB — CBC WITH DIFFERENTIAL
BASOS ABS: 0.1 10*3/uL (ref 0.0–0.2)
Basos: 1 %
EOS (ABSOLUTE): 0.2 10*3/uL (ref 0.0–0.4)
Eos: 2 %
Hematocrit: 41.3 % (ref 34.0–46.6)
Hemoglobin: 13.2 g/dL (ref 11.1–15.9)
IMMATURE GRANULOCYTES: 0 %
Immature Grans (Abs): 0 10*3/uL (ref 0.0–0.1)
Lymphocytes Absolute: 2.1 10*3/uL (ref 0.7–3.1)
Lymphs: 33 %
MCH: 29.3 pg (ref 26.6–33.0)
MCHC: 32 g/dL (ref 31.5–35.7)
MCV: 92 fL (ref 79–97)
MONOS ABS: 0.6 10*3/uL (ref 0.1–0.9)
Monocytes: 10 %
NEUTROS PCT: 54 %
Neutrophils Absolute: 3.3 10*3/uL (ref 1.4–7.0)
RBC: 4.51 x10E6/uL (ref 3.77–5.28)
RDW: 14 % (ref 12.3–15.4)
WBC: 6.2 10*3/uL (ref 3.4–10.8)

## 2017-01-11 LAB — COMPREHENSIVE METABOLIC PANEL
ALK PHOS: 74 IU/L (ref 39–117)
ALT: 25 IU/L (ref 0–32)
AST: 17 IU/L (ref 0–40)
Albumin/Globulin Ratio: 1.6 (ref 1.2–2.2)
Albumin: 4.2 g/dL (ref 3.6–4.8)
BUN/Creatinine Ratio: 33 — ABNORMAL HIGH (ref 12–28)
BUN: 24 mg/dL (ref 8–27)
Bilirubin Total: 0.4 mg/dL (ref 0.0–1.2)
CALCIUM: 9.9 mg/dL (ref 8.7–10.3)
CO2: 24 mmol/L (ref 20–29)
CREATININE: 0.72 mg/dL (ref 0.57–1.00)
Chloride: 101 mmol/L (ref 96–106)
GFR calc Af Amer: 99 mL/min/{1.73_m2} (ref >59)
GFR, EST NON AFRICAN AMERICAN: 86 mL/min/{1.73_m2} (ref >59)
Globulin, Total: 2.6 g/dL (ref 1.5–4.5)
Glucose: 101 mg/dL — ABNORMAL HIGH (ref 65–99)
POTASSIUM: 4.9 mmol/L (ref 3.5–5.2)
Sodium: 138 mmol/L (ref 134–144)
Total Protein: 6.8 g/dL (ref 6.0–8.5)

## 2017-01-11 LAB — HEMOGLOBIN A1C
ESTIMATED AVERAGE GLUCOSE: 120 mg/dL
HEMOGLOBIN A1C: 5.8 % — AB (ref 4.8–5.6)

## 2017-01-11 LAB — T3: T3, Total: 125 ng/dL (ref 71–180)

## 2017-01-11 LAB — INSULIN, RANDOM: INSULIN: 11.7 u[IU]/mL (ref 2.6–24.9)

## 2017-01-11 LAB — LIPID PANEL WITH LDL/HDL RATIO
CHOLESTEROL TOTAL: 241 mg/dL — AB (ref 100–199)
HDL: 60 mg/dL (ref >39)
LDL Calculated: 165 mg/dL — ABNORMAL HIGH (ref 0–99)
LDl/HDL Ratio: 2.8 ratio (ref 0.0–3.2)
TRIGLYCERIDES: 81 mg/dL (ref 0–149)
VLDL CHOLESTEROL CAL: 16 mg/dL (ref 5–40)

## 2017-01-11 LAB — VITAMIN D 25 HYDROXY (VIT D DEFICIENCY, FRACTURES): Vit D, 25-Hydroxy: 50.7 ng/mL (ref 30.0–100.0)

## 2017-01-11 LAB — T4, FREE: Free T4: 1.07 ng/dL (ref 0.82–1.77)

## 2017-01-11 LAB — TSH: TSH: 1.02 u[IU]/mL (ref 0.450–4.500)

## 2017-01-11 NOTE — Telephone Encounter (Signed)
Left [employer] meals and want to know if she can eat them.  Do they meet the criteria 300 cal 20 gms protein  Pt number [phone]

## 2017-01-11 NOTE — Telephone Encounter (Signed)
[name] spoke with the patient. Cindy [name], Cindy [name]

## 2017-01-11 NOTE — Progress Notes (Signed)
Office: [phone]  /  Fax: [phone]   Dear Dr. Charlett [name],   Thank you for referring Cindy [name] to our clinic. The following note includes my evaluation and treatment recommendations.  HPI:   Chief Complaint: OBESITY    Cindy [name] has been referred by Cindy Levine A. Cindy Blake, MD for consultation regarding her obesity and obesity related comorbidities.    Cindy [name] (MR# [id number]) is a 71 y.o. female who presents on 01/11/2017 for obesity evaluation and treatment. Current BMI is Body mass index is 30.79 kg/m.Marland Kitchen Cindy [name] has been struggling with her weight for many years and has been unsuccessful in either losing weight, maintaining weight loss, or reaching her healthy weight goal.     Cindy [name] attended our information session and states she is currently in the action stage of change and ready to dedicate time achieving and maintaining a healthier weight. Cindy [name] is interested in becoming our patient and working on intensive lifestyle modifications including (but not limited to) diet, exercise and weight loss.    Cindy [name] states her desired weight loss is 32.4 lbs she started gaining weight in her mid 40's her heaviest weight ever was 204 lbs. she has significant food cravings issues  she snacks frequently in the evenings she skips meals frequently she is frequently drinking liquids with calories she frequently makes poor food choices she has problems with excessive hunger  she frequently eats larger portions than normal  she has binge eating behaviors she struggles with emotional eating    Fatigue Cindy [name] feels her energy is lower than it should be. This has worsened with weight gain and has not worsened recently. Cindy [name] admits to daytime somnolence and denies waking up still tired. Patient is at risk for obstructive sleep apnea. Cindy [name] has a history of symptoms of daytime fatigue. Patient generally gets 7 or 8 hours of sleep per night, and states they generally have restful sleep. Snoring  is present. Apneic episodes are present. Epworth Sleepiness Score is 13   Dyspnea on exertion [name] notes increasing shortness of breath with exercising and seems to be worsening over time with weight gain. She notes getting out of breath sooner with activity than she used to. This has not gotten worse recently. [name] denies orthopnea.  Vitamin D deficiency Cindy [name] has a diagnosis of vitamin D deficiency. She is currently taking OTC vit D 2,000 IU daily and denies nausea, vomiting or muscle weakness.  Hyperlipidemia Cindy [name] has hyperlipidemia and is on Pravastatin currently. She is attempting to improve her cholesterol levels with intensive lifestyle modification including a low saturated fat diet, exercise and weight loss. She denies any chest pain, claudication or myalgias.  Pre-Diabetes Cindy [name] has a diagnosis of pre-diabetes based on her elevated Hgb A1c and was informed this puts her at greater risk of developing diabetes. She is not taking metformin currently and continues to work on diet and exercise to decrease risk of diabetes. She admits polyphagia and  denies nausea or hypoglycemia.   Depression Screen Cindy [name]'s Food and Mood (modified PHQ-9) score was  Depression screen PHQ 2/9 01/10/2017  Decreased Interest 1  Down, Depressed, Hopeless 2  PHQ - 2 Score 3  Altered sleeping 0  Tired, decreased energy 1  Change in appetite 3  Feeling bad or failure about yourself  2  Trouble concentrating 0  Moving slowly or fidgety/restless 0  Suicidal thoughts 0  PHQ-9 Score 9  Difficult doing work/chores Not difficult at all    ALLERGIES: Allergies  Allergen Reactions  .  Codeine Other (See Comments)    hallucinations  . Crestor [Rosuvastatin]     REACTION: myalgias  . Other Other (See Comments)    NOVACAINE- blisters left side of mouth after receiving   . Amoxicillin Rash  . Erythromycin Other (See Comments)    cramps  . Sulfa Antibiotics Rash  . Sulfonamide Derivatives Rash     MEDICATIONS: Current Outpatient Prescriptions on File Prior to Visit  Medication Sig Dispense Refill  . aspirin 81 MG tablet Take 81 mg by mouth daily.    . cholecalciferol (VITAMIN D) 1000 units tablet Take 2,000 Units by mouth daily.     . cycloSPORINE (RESTASIS) 0.05 % ophthalmic emulsion Place 1 drop into both eyes daily as needed. Reported on 07/09/2015    . DULoxetine (CYMBALTA) 30 MG capsule TAKE ONE CAPSULE BY MOUTH DAILY (Patient taking differently: every other day) 30 capsule 0  . fluticasone (FLONASE) 50 MCG/ACT nasal spray Place 1 spray into both nostrils as needed.     Marland Kitchen levocetirizine (XYZAL) 5 MG tablet Take 5 mg by mouth as needed.   3  . pravastatin (PRAVACHOL) 80 MG tablet Take 0.5 tablets (40 mg total) by mouth daily. 45 tablet 0  . Turmeric 500 MG CAPS Take by mouth.     No current facility-administered medications on file prior to visit.     PAST MEDICAL HISTORY: Past Medical History:  Diagnosis Date  . Abnormal liver function tests 07/19/2015  . Allergic state 02/01/2010   Qualifier: Diagnosis of  By: Regis Bill MD, Standley Brooking   . Allergy   . Anxiety   . Back pain   . Chicken pox 71 yrs old  . Depressive disorder, not elsewhere classified   . Diabetes ([place])    diet controlled- no medicines   . Fibroid   . History of fractured kneecap    right   . Hyperglycemia   . Hyperlipidemia   . Infertility, female   . Joint pain   . Knee pain, left 10/05/2011   Follows with Dr Margarette Canada  . Measles as a child  . Medicare annual wellness visit, subsequent 07/06/2014  . OA (osteoarthritis) of knee    left knee  . Obesity 10/05/2011  . Obesity 10/05/2011  . Osteoarthritis   . Osteopenia 06/26/2014  . Other and unspecified hyperlipidemia   . Overweight 10/05/2011  . Overweight(278.02) 10/05/2011  . Sleep apnea     On CPAP  . Sleep apnea   . Stye 11/19/2012  . Thyroid disease    h/o hyperthyroid treated with radioactive iodine? from 16 to 20  . Type 2 diabetes mellitus with  hyperlipidemia ([place])   . Varicose vein of leg     PAST SURGICAL HISTORY: Past Surgical History:  Procedure Laterality Date  . BACK SURGERY     L5 discectomy, 2002. helpful  . CERVICAL BIOPSY  W/ LOOP ELECTRODE EXCISION    . CESAREAN SECTION  1987  . COLONOSCOPY    . CPAP    . KNEE SURGERY Right 2006   R knee fx. patella  . LEEP    . stitches in foot Right     SOCIAL HISTORY: Social History  Substance Use Topics  . Smoking status: Never Smoker  . Smokeless tobacco: Never Used  . Alcohol use 0.6 oz/week    1 Glasses of wine per week    FAMILY HISTORY: Family History  Problem Relation Age of Onset  . Hyperlipidemia Mother   . Dementia Mother   .  Obesity Mother   . Hyperlipidemia Father   . Heart disease Father        bypass, CAD  . Sleep apnea Father   . Hyperlipidemia Sister   . Diabetes Sister        type 2  . Colon polyps Sister   . Dementia Maternal Grandmother        alzheimer  . Alcohol abuse Maternal Grandfather   . Diabetes Paternal Grandmother        type 2?  . Colon cancer Neg Hx   . Esophageal cancer Neg Hx   . Rectal cancer Neg Hx   . Stomach cancer Neg Hx     ROS: Review of Systems  Constitutional: Positive for fever (occasionally).  HENT: Positive for congestion (nasal stuffiness) and hearing loss (maybe).        Nasal Discharge  Eyes:       Wear Glasses or Contacts  Cardiovascular: Negative for chest pain, orthopnea and claudication.       Leg Cramping Very Cold Feet or Hands  Gastrointestinal: Negative for nausea and vomiting.  Musculoskeletal: Positive for back pain (sometimes). Negative for myalgias.       Muscle or Joint Pain (sometimes) Muscle Stiffness (sometimes) Negative muscle weakness  Neurological: Positive for headaches (occasionally).  Endo/Heme/Allergies:       Cold Intolerance Positive polyphagia Negative hypoglycemia    PHYSICAL EXAM: Blood pressure 137/63, pulse 60, temperature (!) 97.4 F (36.3 C), temperature  source Oral, height 5\' 5"  (1.651 m), weight 185 lb (83.9 kg), last menstrual period 04/04/2000, SpO2 98 %. Body mass index is 30.79 kg/m. Physical Exam  Constitutional: She is oriented to person, place, and time. She appears well-developed and well-nourished.  Cardiovascular: Normal rate.   Pulmonary/Chest: Effort normal.  Musculoskeletal: Normal range of motion.  Neurological: She is oriented to person, place, and time.  Skin: Skin is warm and dry.  Psychiatric: She has a normal mood and affect. Her behavior is normal.  Vitals reviewed.   RECENT LABS AND TESTS: BMET    Component Value Date/Time   NA 138 01/10/2017 0938   K 4.9 01/10/2017 0938   CL 101 01/10/2017 0938   CO2 24 01/10/2017 0938   GLUCOSE 101 (H) 01/10/2017 0938   GLUCOSE 112 (H) 08/05/2016 0956   BUN 24 01/10/2017 0938   CREATININE 0.72 01/10/2017 0938   CALCIUM 9.9 01/10/2017 0938   GFRNONAA 86 01/10/2017 0938   GFRAA 99 01/10/2017 0938   Lab Results  Component Value Date   HGBA1C 5.8 (H) 01/10/2017   Lab Results  Component Value Date   INSULIN 11.7 01/10/2017   CBC    Component Value Date/Time   WBC 6.2 01/10/2017 0938   WBC 7.4 08/05/2016 0956   RBC 4.51 01/10/2017 0938   RBC 4.56 08/05/2016 0956   HGB 13.2 01/10/2017 0938   HCT 41.3 01/10/2017 0938   PLT 308.0 08/05/2016 0956   MCV 92 01/10/2017 0938   MCH 29.3 01/10/2017 0938   MCHC 32.0 01/10/2017 0938   MCHC 33.6 08/05/2016 0956   RDW 14.0 01/10/2017 0938   LYMPHSABS 2.1 01/10/2017 0938   MONOABS 0.7 08/05/2016 0956   EOSABS 0.2 01/10/2017 0938   BASOSABS 0.1 01/10/2017 0938   Iron/TIBC/Ferritin/ %Sat No results found for: IRON, TIBC, FERRITIN, IRONPCTSAT Lipid Panel     Component Value Date/Time   CHOL 241 (H) 01/10/2017 0938   TRIG 81 01/10/2017 0938   TRIG 94 04/07/2006 1019   HDL 60  01/10/2017 0938   CHOLHDL 4 08/05/2016 0956   VLDL 20.0 08/05/2016 0956   LDLCALC 165 (H) 01/10/2017 0938   LDLDIRECT 145.4 11/14/2012 0823     Hepatic Function Panel     Component Value Date/Time   PROT 6.8 01/10/2017 0938   ALBUMIN 4.2 01/10/2017 0938   AST 17 01/10/2017 0938   ALT 25 01/10/2017 0938   ALKPHOS 74 01/10/2017 0938   BILITOT 0.4 01/10/2017 0938   BILIDIR 0.1 10/29/2013 0831      Component Value Date/Time   TSH 1.020 01/10/2017 0938   TSH 1.21 08/05/2016 0956   TSH 1.56 07/06/2015 0936    ECG  shows NSR with a rate of 70 BPM INDIRECT CALORIMETER done today shows a VO2 of 173 and a REE of 1205.  Her calculated basal metabolic rate is 3151 thus her basal metabolic rate is worse than expected.    ASSESSMENT AND PLAN: Other fatigue - Plan: EKG 12-Lead, CBC With Differential, T3, T4, free, TSH  Shortness of breath on exertion  Prediabetes - Plan: Comprehensive metabolic panel, Hemoglobin A1c, Insulin, random  Vitamin D deficiency - Plan: VITAMIN D 25 Hydroxy (Vit-D Deficiency, Fractures)  Other hyperlipidemia - Plan: Lipid Panel With LDL/HDL Ratio  Depression screening  Class 1 obesity with serious comorbidity and body mass index (BMI) of 30.0 to 30.9 in adult, unspecified obesity type  PLAN: Fatigue Cindy [name] was informed that her fatigue may be related to obesity, depression or many other causes. Labs will be ordered, and in the meanwhile Cindy [name] has agreed to work on diet, exercise and weight loss to help with fatigue. Proper sleep hygiene was discussed including the need for 7-8 hours of quality sleep each night. A sleep study was not ordered based on symptoms and Epworth score.  Dyspnea on exertion Cindy [name]'s shortness of breath appears to be obesity related and exercise induced. She has agreed to work on weight loss and gradually increase exercise to treat her exercise induced shortness of breath. If Cindy [name] follows our instructions and loses weight without improvement of her shortness of breath, we will plan to refer to pulmonology. We will monitor this condition regularly. Cindy [name] agrees to this  plan.  Vitamin D Deficiency Cindy [name] was informed that low vitamin D levels contributes to fatigue and are associated with obesity, breast, and colon cancer. She agrees to continue to take OTC vitamin D and she will work on diet, exercise and weight loss. We will check labs and will follow up for routine testing of vitamin D, at least 2-3 times per year. She was informed of the risk of over-replacement of vitamin D and agrees to not increase her dose unless he discusses this with [name] first. Cindy [name] agrees to follow up with our clinic in 2 weeks.  Hyperlipidemia Cindy [name] was informed of the American Heart Association Guidelines emphasizing intensive lifestyle modifications as the first line treatment for hyperlipidemia. We discussed many lifestyle modifications today in depth, and Cindy [name] will continue to work on decreasing saturated fats such as fatty red meat, butter and many fried foods. She will also increase vegetables and lean protein in her diet and continue to work on exercise and weight loss efforts. We will check labs and Cindy [name] agrees to follow up with our clinic in 2 weeks.  Pre-Diabetes Cindy [name] will continue to work on weight loss, exercise, and decreasing simple carbohydrates in her diet to help decrease the risk of diabetes.  She was informed that eating too many simple carbohydrates or too many  calories at one sitting increases the likelihood of GI side effects. We will check labs and Cindy [name] agreed to follow up with [name] as directed to monitor her progress.  Depression Screen Cindy [name] had a mildly positive depression screening. Depression is commonly associated with obesity and often results in emotional eating behaviors. We will monitor this closely and work on CBT to help improve the non-hunger eating patterns. Referral to Psychology may be required if no improvement is seen as she continues in our clinic.  Obesity Cindy [name] is currently in the action stage of change and her goal is to continue with weight  loss efforts. I recommend Cindy [name] begin the structured treatment plan as follows:  She has agreed to follow the Category 2 plan Chantia has been instructed to eventually work up to a goal of 150 minutes of combined cardio and strengthening exercise per week for weight loss and overall health benefits. We discussed the following Behavioral Modification Strategies today: increasing lean protein intake, decreasing simple carbohydrates  and decrease eating out   She was informed of the importance of frequent follow up visits to maximize her success with intensive lifestyle modifications for her multiple health conditions. She was informed we would discuss her lab results at her next visit unless there is a critical issue that needs to be addressed sooner. Carsynn agreed to keep her next visit at the agreed upon time to discuss these results.  I, Doreene Nest, am acting as transcriptionist for  Dennard Nip, MD  I have reviewed the above documentation for accuracy and completeness, and I agree with the above. -Dennard Nip, MD   OBESITY BEHAVIORAL INTERVENTION VISIT  Today's visit was # 1 out of 81.  Starting weight: 185 lbs Starting date: 01/10/17 Today's weight : 185 lbs Today's date: 01/10/2017 Total lbs lost to date: 0 (Patients must lose 7 lbs in the first 6 months to continue with counseling)   ASK: We discussed the diagnosis of obesity with Lurlean Leyden today and Dylann agreed to give [name] permission to discuss obesity behavioral modification therapy today.  ASSESS: Porchea has the diagnosis of obesity and her BMI today is 30.79 Kaeli is in the action stage of change   ADVISE: Joeanna was educated on the multiple health risks of obesity as well as the benefit of weight loss to improve her health. She was advised of the need for long term treatment and the importance of lifestyle modifications.  AGREE: Multiple dietary modification options and treatment options were discussed and  Bhavika  agreed to follow the Category 2 plan We discussed the following Behavioral Modification Strategies today: increasing lean protein intake, decreasing simple carbohydrates  and decrease eating out

## 2017-01-12 NOTE — Telephone Encounter (Signed)
completed

## 2017-01-13 DIAGNOSIS — M62830 Muscle spasm of back: Secondary | ICD-10-CM | POA: Diagnosis not present

## 2017-01-13 DIAGNOSIS — M9903 Segmental and somatic dysfunction of lumbar region: Secondary | ICD-10-CM | POA: Diagnosis not present

## 2017-01-13 DIAGNOSIS — M5412 Radiculopathy, cervical region: Secondary | ICD-10-CM | POA: Diagnosis not present

## 2017-01-13 DIAGNOSIS — M9901 Segmental and somatic dysfunction of cervical region: Secondary | ICD-10-CM | POA: Diagnosis not present

## 2017-01-17 DIAGNOSIS — M5412 Radiculopathy, cervical region: Secondary | ICD-10-CM | POA: Diagnosis not present

## 2017-01-17 DIAGNOSIS — M9901 Segmental and somatic dysfunction of cervical region: Secondary | ICD-10-CM | POA: Diagnosis not present

## 2017-01-17 DIAGNOSIS — M9903 Segmental and somatic dysfunction of lumbar region: Secondary | ICD-10-CM | POA: Diagnosis not present

## 2017-01-17 DIAGNOSIS — M62830 Muscle spasm of back: Secondary | ICD-10-CM | POA: Diagnosis not present

## 2017-01-21 DIAGNOSIS — G4733 Obstructive sleep apnea (adult) (pediatric): Secondary | ICD-10-CM | POA: Diagnosis not present

## 2017-01-23 DIAGNOSIS — M5412 Radiculopathy, cervical region: Secondary | ICD-10-CM | POA: Diagnosis not present

## 2017-01-23 DIAGNOSIS — M9901 Segmental and somatic dysfunction of cervical region: Secondary | ICD-10-CM | POA: Diagnosis not present

## 2017-01-23 DIAGNOSIS — M9903 Segmental and somatic dysfunction of lumbar region: Secondary | ICD-10-CM | POA: Diagnosis not present

## 2017-01-23 DIAGNOSIS — M62830 Muscle spasm of back: Secondary | ICD-10-CM | POA: Diagnosis not present

## 2017-01-24 ENCOUNTER — Encounter (INDEPENDENT_AMBULATORY_CARE_PROVIDER_SITE_OTHER): Payer: Self-pay

## 2017-01-24 ENCOUNTER — Ambulatory Visit (INDEPENDENT_AMBULATORY_CARE_PROVIDER_SITE_OTHER): Payer: Medicare HMO | Admitting: Family Medicine

## 2017-01-25 ENCOUNTER — Ambulatory Visit (INDEPENDENT_AMBULATORY_CARE_PROVIDER_SITE_OTHER): Payer: Medicare HMO | Admitting: Family Medicine

## 2017-01-25 VITALS — BP 130/58 | HR 50 | Temp 97.6°F | Ht 65.0 in | Wt 178.0 lb

## 2017-01-25 DIAGNOSIS — R7303 Prediabetes: Secondary | ICD-10-CM | POA: Diagnosis not present

## 2017-01-25 DIAGNOSIS — Z6830 Body mass index (BMI) 30.0-30.9, adult: Secondary | ICD-10-CM

## 2017-01-25 DIAGNOSIS — E669 Obesity, unspecified: Secondary | ICD-10-CM

## 2017-01-25 MED ORDER — METFORMIN HCL 500 MG PO TABS: 500.0000 mg | ORAL_TABLET | Freq: Every day | ORAL | 0 refills | Status: DC

## 2017-01-25 NOTE — Progress Notes (Signed)
Office: [phone]  /  Fax: [phone]   HPI:   Chief Complaint: OBESITY Cindy [name] is here to discuss her progress with her obesity treatment plan. She is on the Category 2 plan and is following her eating plan approximately 90 % of the time. She states she is exercising 0 minutes 0 times per week. Nuvia did well with the category 2 plan and with weight loss but she got bored very quickly with dinner. She missed snacking as much after dinner and she didn't like having to eat so many vegetables. Her weight is 178 lb (80.7 kg) today and has had a weight loss of 7 pounds over a period of 2 weeks since her last visit. She has lost 7 lbs since starting treatment with [name].  Pre-Diabetes Pretty has a diagnosis of pre-diabetes based on her elevated Hgb A1c, glucose and insulin. She was informed this puts her at greater risk of developing diabetes. She is not taking metformin currently and continues to work on diet and exercise to decrease risk of diabetes. She admits polyphagia and denies nausea or hypoglycemia.   ALLERGIES: Allergies  Allergen Reactions  . Codeine Other (See Comments)    hallucinations  . Crestor [Rosuvastatin]     REACTION: myalgias  . Other Other (See Comments)    NOVACAINE- blisters left side of mouth after receiving   . Amoxicillin Rash  . Erythromycin Other (See Comments)    cramps  . Sulfa Antibiotics Rash  . Sulfonamide Derivatives Rash    MEDICATIONS: Current Outpatient Prescriptions on File Prior to Visit  Medication Sig Dispense Refill  . aspirin 81 MG tablet Take 81 mg by mouth daily.    . cholecalciferol (VITAMIN D) 1000 units tablet Take 2,000 Units by mouth daily.     . cycloSPORINE (RESTASIS) 0.05 % ophthalmic emulsion Place 1 drop into both eyes daily as needed. Reported on 07/09/2015    . DULoxetine (CYMBALTA) 30 MG capsule TAKE ONE CAPSULE BY MOUTH DAILY (Patient taking differently: every other day) 30 capsule 0  . fluticasone (FLONASE) 50 MCG/ACT  nasal spray Place 1 spray into both nostrils as needed.     Marland Kitchen levocetirizine (XYZAL) 5 MG tablet Take 5 mg by mouth as needed.   3  . pravastatin (PRAVACHOL) 80 MG tablet Take 0.5 tablets (40 mg total) by mouth daily. 45 tablet 0  . Turmeric 500 MG CAPS Take by mouth.     No current facility-administered medications on file prior to visit.     PAST MEDICAL HISTORY: Past Medical History:  Diagnosis Date  . Abnormal liver function tests 07/19/2015  . Allergic state 02/01/2010   Qualifier: Diagnosis of  By: Regis Bill MD, Standley Brooking   . Allergy   . Anxiety   . Back pain   . Chicken pox 71 yrs old  . Depressive disorder, not elsewhere classified   . Diabetes ([place])    diet controlled- no medicines   . Fibroid   . History of fractured kneecap    right   . Hyperglycemia   . Hyperlipidemia   . Infertility, female   . Joint pain   . Knee pain, left 10/05/2011   Follows with Dr Margarette Canada  . Measles as a child  . Medicare annual wellness visit, subsequent 07/06/2014  . OA (osteoarthritis) of knee    left knee  . Obesity 10/05/2011  . Obesity 10/05/2011  . Osteoarthritis   . Osteopenia 06/26/2014  . Other and unspecified hyperlipidemia   . Overweight  10/05/2011  . Overweight(278.02) 10/05/2011  . Sleep apnea     On CPAP  . Sleep apnea   . Stye 11/19/2012  . Thyroid disease    h/o hyperthyroid treated with radioactive iodine? from 16 to 20  . Type 2 diabetes mellitus with hyperlipidemia ([place])   . Varicose vein of leg     PAST SURGICAL HISTORY: Past Surgical History:  Procedure Laterality Date  . BACK SURGERY     L5 discectomy, 2002. helpful  . CERVICAL BIOPSY  W/ LOOP ELECTRODE EXCISION    . CESAREAN SECTION  1987  . COLONOSCOPY    . CPAP    . KNEE SURGERY Right 2006   R knee fx. patella  . LEEP    . stitches in foot Right     SOCIAL HISTORY: Social History  Substance Use Topics  . Smoking status: Never Smoker  . Smokeless tobacco: Never Used  . Alcohol use 0.6 oz/week    1 Glasses  of wine per week    FAMILY HISTORY: Family History  Problem Relation Age of Onset  . Hyperlipidemia Mother   . Dementia Mother   . Obesity Mother   . Hyperlipidemia Father   . Heart disease Father        bypass, CAD  . Sleep apnea Father   . Hyperlipidemia Sister   . Diabetes Sister        type 2  . Colon polyps Sister   . Dementia Maternal Grandmother        alzheimer  . Alcohol abuse Maternal Grandfather   . Diabetes Paternal Grandmother        type 2?  . Colon cancer Neg Hx   . Esophageal cancer Neg Hx   . Rectal cancer Neg Hx   . Stomach cancer Neg Hx     ROS: Review of Systems  Constitutional: Positive for weight loss.  Gastrointestinal: Negative for nausea.  Endo/Heme/Allergies:       Positive polyphagia Negative hypoglycemia    PHYSICAL EXAM: Blood pressure (!) 130/58, pulse (!) 50, temperature 97.6 F (36.4 C), temperature source Oral, height 5\' 5"  (1.651 m), weight 178 lb (80.7 kg), last menstrual period 04/04/2000, SpO2 100 %. Body mass index is 29.62 kg/m. Physical Exam  Constitutional: She is oriented to person, place, and time. She appears well-developed and well-nourished.  Cardiovascular: Normal rate.   Pulmonary/Chest: Effort normal.  Musculoskeletal: Normal range of motion.  Neurological: She is oriented to person, place, and time.  Skin: Skin is warm and dry.  Psychiatric: She has a normal mood and affect. Her behavior is normal.  Vitals reviewed.   RECENT LABS AND TESTS: BMET    Component Value Date/Time   NA 138 01/10/2017 0938   K 4.9 01/10/2017 0938   CL 101 01/10/2017 0938   CO2 24 01/10/2017 0938   GLUCOSE 101 (H) 01/10/2017 0938   GLUCOSE 112 (H) 08/05/2016 0956   BUN 24 01/10/2017 0938   CREATININE 0.72 01/10/2017 0938   CALCIUM 9.9 01/10/2017 0938   GFRNONAA 86 01/10/2017 0938   GFRAA 99 01/10/2017 0938   Lab Results  Component Value Date   HGBA1C 5.8 (H) 01/10/2017   HGBA1C 6.1 08/05/2016   HGBA1C 5.9 07/06/2015    HGBA1C 5.6 01/02/2015   HGBA1C 6.2 07/04/2014   Lab Results  Component Value Date   INSULIN 11.7 01/10/2017   CBC    Component Value Date/Time   WBC 6.2 01/10/2017 0938   WBC 7.4 08/05/2016 0956  RBC 4.51 01/10/2017 0938   RBC 4.56 08/05/2016 0956   HGB 13.2 01/10/2017 0938   HCT 41.3 01/10/2017 0938   PLT 308.0 08/05/2016 0956   MCV 92 01/10/2017 0938   MCH 29.3 01/10/2017 0938   MCHC 32.0 01/10/2017 0938   MCHC 33.6 08/05/2016 0956   RDW 14.0 01/10/2017 0938   LYMPHSABS 2.1 01/10/2017 0938   MONOABS 0.7 08/05/2016 0956   EOSABS 0.2 01/10/2017 0938   BASOSABS 0.1 01/10/2017 0938   Iron/TIBC/Ferritin/ %Sat No results found for: IRON, TIBC, FERRITIN, IRONPCTSAT Lipid Panel     Component Value Date/Time   CHOL 241 (H) 01/10/2017 0938   TRIG 81 01/10/2017 0938   TRIG 94 04/07/2006 1019   HDL 60 01/10/2017 0938   CHOLHDL 4 08/05/2016 0956   VLDL 20.0 08/05/2016 0956   LDLCALC 165 (H) 01/10/2017 0938   LDLDIRECT 145.4 11/14/2012 0823   Hepatic Function Panel     Component Value Date/Time   PROT 6.8 01/10/2017 0938   ALBUMIN 4.2 01/10/2017 0938   AST 17 01/10/2017 0938   ALT 25 01/10/2017 0938   ALKPHOS 74 01/10/2017 0938   BILITOT 0.4 01/10/2017 0938   BILIDIR 0.1 10/29/2013 0831      Component Value Date/Time   TSH 1.020 01/10/2017 0938   TSH 1.21 08/05/2016 0956   TSH 1.56 07/06/2015 0936    ASSESSMENT AND PLAN: Prediabetes - Plan: metFORMIN (GLUCOPHAGE) 500 MG tablet  Class 1 obesity with serious comorbidity and body mass index (BMI) of 30.0 to 30.9 in adult, unspecified obesity type - starting BMI greater then 30  PLAN:  Pre-Diabetes Kaymarie will continue to work on weight loss, exercise, and decreasing simple carbohydrates in her diet to help decrease the risk of diabetes. We dicussed metformin including benefits and risks. She was informed that eating too many simple carbohydrates or too many calories at one sitting increases the likelihood of GI  side effects. Emilianna agrees to start metformin for now and a prescription was written today for metformin 500 mg qam #30 with no refills. Dollye agreed to follow up with [name] as directed to monitor her progress.  Obesity Dominique is currently in the action stage of change. As such, her goal is to continue with weight loss efforts She has agreed to follow the Category 2 plan Koryn has been instructed to work up to a goal of 150 minutes of combined cardio and strengthening exercise per week for weight loss and overall health benefits. We discussed the following Behavioral Modification Strategies today: increasing lean protein intake, decreasing simple carbohydrates  and work on meal planning and easy cooking plans  Aliyana has agreed to follow up with our clinic in 2 weeks. She was informed of the importance of frequent follow up visits to maximize her success with intensive lifestyle modifications for her multiple health conditions.  I, Doreene Nest, am acting as transcriptionist for Dennard Nip, MD  I have reviewed the above documentation for accuracy and completeness, and I agree with the above. -Dennard Nip, MD    OBESITY BEHAVIORAL INTERVENTION VISIT  Today's visit was # 2 out of 14.  Starting weight: 185 lbs Starting date: 01/10/17 Today's weight : 178 lbs Today's date: 01/25/2017 Total lbs lost to date: 7 (Patients must lose 7 lbs in the first 6 months to continue with counseling)   ASK: We discussed the diagnosis of obesity with Lurlean Leyden today and Antanasia agreed to give [name] permission to discuss obesity behavioral modification therapy today.  ASSESS: Calee has the diagnosis of obesity and her BMI today is 29.62 Camika is in the action stage of change   ADVISE: Shyteria was educated on the multiple health risks of obesity as well as the benefit of weight loss to improve her health. She was advised of the need for long term treatment and the importance of lifestyle  modifications.  AGREE: Multiple dietary modification options and treatment options were discussed and Olanda agreed to follow the Category 2 plan We discussed the following Behavioral Modification Strategies today: increasing lean protein intake, decreasing simple carbohydrates and work on meal planning and easy cooking plans

## 2017-01-26 ENCOUNTER — Encounter: Payer: Self-pay | Admitting: Family Medicine

## 2017-01-26 ENCOUNTER — Ambulatory Visit (INDEPENDENT_AMBULATORY_CARE_PROVIDER_SITE_OTHER): Payer: Medicare HMO | Admitting: Family Medicine

## 2017-01-26 VITALS — BP 102/48 | HR 70 | Temp 98.3°F | Wt 178.0 lb

## 2017-01-26 DIAGNOSIS — E6609 Other obesity due to excess calories: Secondary | ICD-10-CM | POA: Diagnosis not present

## 2017-01-26 DIAGNOSIS — E559 Vitamin D deficiency, unspecified: Secondary | ICD-10-CM | POA: Diagnosis not present

## 2017-01-26 DIAGNOSIS — E1169 Type 2 diabetes mellitus with other specified complication: Secondary | ICD-10-CM | POA: Diagnosis not present

## 2017-01-26 DIAGNOSIS — R55 Syncope and collapse: Secondary | ICD-10-CM

## 2017-01-26 DIAGNOSIS — Z23 Encounter for immunization: Secondary | ICD-10-CM | POA: Diagnosis not present

## 2017-01-26 DIAGNOSIS — E7849 Other hyperlipidemia: Secondary | ICD-10-CM | POA: Diagnosis not present

## 2017-01-26 DIAGNOSIS — E785 Hyperlipidemia, unspecified: Secondary | ICD-10-CM | POA: Diagnosis not present

## 2017-01-26 HISTORY — DX: Syncope and collapse: R55

## 2017-01-26 NOTE — Assessment & Plan Note (Signed)
hgba1c acceptable, minimize simple carbs. Increase exercise as tolerated. Continue current meds 

## 2017-01-26 NOTE — Patient Instructions (Signed)
Encouraged increased rest and hydration, add probiotics, zinc such as Coldeze or Xicam. Treat fevers as needed. Nasal saline, vitamin C 500 to 1000 mg, Elderberry liquid, Mucinex twice daily    Carbohydrate Counting for Diabetes Mellitus, Adult Carbohydrate counting is a method for keeping track of how many carbohydrates you eat. Eating carbohydrates naturally increases the amount of sugar (glucose) in the blood. Counting how many carbohydrates you eat helps keep your blood glucose within normal limits, which helps you manage your diabetes (diabetes mellitus). It is important to know how many carbohydrates you can safely have in each meal. This is different for every person. A diet and nutrition specialist (registered dietitian) can help you make a meal plan and calculate how many carbohydrates you should have at each meal and snack. Carbohydrates are found in the following foods:  Grains, such as breads and cereals.  Dried beans and soy products.  Starchy vegetables, such as potatoes, peas, and corn.  Fruit and fruit juices.  Milk and yogurt.  Sweets and snack foods, such as cake, cookies, candy, chips, and soft drinks.  How do I count carbohydrates? There are two ways to count carbohydrates in food. You can use either of the methods or a combination of both. Reading "Nutrition Facts" on packaged food The "Nutrition Facts" list is included on the labels of almost all packaged foods and beverages in the [place] It includes:  The serving size.  Information about nutrients in each serving, including the grams (g) of carbohydrate per serving.  To use the "Nutrition Facts":  Decide how many servings you will have.  Multiply the number of servings by the number of carbohydrates per serving.  The resulting number is the total amount of carbohydrates that you will be having.  Learning standard serving sizes of other foods When you eat foods containing carbohydrates that are not packaged  or do not include "Nutrition Facts" on the label, you need to measure the servings in order to count the amount of carbohydrates:  Measure the foods that you will eat with a food scale or measuring cup, if needed.  Decide how many standard-size servings you will eat.  Multiply the number of servings by 15. Most carbohydrate-rich foods have about 15 g of carbohydrates per serving. ? For example, if you eat 8 oz (170 g) of strawberries, you will have eaten 2 servings and 30 g of carbohydrates (2 servings x 15 g = 30 g).  For foods that have more than one food mixed, such as soups and casseroles, you must count the carbohydrates in each food that is included.  The following list contains standard serving sizes of common carbohydrate-rich foods. Each of these servings has about 15 g of carbohydrates:   hamburger bun or  English muffin.   oz (15 mL) syrup.   oz (14 g) jelly.  1 slice of bread.  1 six-inch tortilla.  3 oz (85 g) cooked rice or pasta.  4 oz (113 g) cooked dried beans.  4 oz (113 g) starchy vegetable, such as peas, corn, or potatoes.  4 oz (113 g) hot cereal.  4 oz (113 g) mashed potatoes or  of a large baked potato.  4 oz (113 g) canned or frozen fruit.  4 oz (120 mL) fruit juice.  4-6 crackers.  6 chicken nuggets.  6 oz (170 g) unsweetened dry cereal.  6 oz (170 g) plain fat-free yogurt or yogurt sweetened with artificial sweeteners.  8 oz (240 mL) milk.  8 oz (170 g) fresh fruit or one small piece of fruit.  24 oz (680 g) popped popcorn.  Example of carbohydrate counting Sample meal  3 oz (85 g) chicken breast.  6 oz (170 g) brown rice.  4 oz (113 g) corn.  8 oz (240 mL) milk.  8 oz (170 g) strawberries with sugar-free whipped topping. Carbohydrate calculation 1. Identify the foods that contain carbohydrates: ? Rice. ? Corn. ? Milk. ? Strawberries. 2. Calculate how many servings you have of each food: ? 2 servings rice. ? 1  serving corn. ? 1 serving milk. ? 1 serving strawberries. 3. Multiply each number of servings by 15 g: ? 2 servings rice x 15 g = 30 g. ? 1 serving corn x 15 g = 15 g. ? 1 serving milk x 15 g = 15 g. ? 1 serving strawberries x 15 g = 15 g. 4. Add together all of the amounts to find the total grams of carbohydrates eaten: ? 30 g + 15 g + 15 g + 15 g = 75 g of carbohydrates total. This information is not intended to replace advice given to you by your health care provider. Make sure you discuss any questions you have with your health care provider. Document Released: 03/21/2005 Document Revised: 10/09/2015 Document Reviewed: 09/02/2015 Elsevier Interactive Patient Education  [employer].

## 2017-01-26 NOTE — Assessment & Plan Note (Signed)
Encouraged DASH diet, decrease po intake and increase exercise as tolerated. Needs 7-8 hours of sleep nightly. Avoid trans fats, eat small, frequent meals every 4-5 hours with lean proteins, complex carbs and healthy fats. Minimize simple carbs, following with bariatric referral

## 2017-01-26 NOTE — Assessment & Plan Note (Signed)
Level wnl. Doing well on current OTC

## 2017-01-26 NOTE — Assessment & Plan Note (Signed)
One episode with heavy laughing has not recurred. Work up was negative

## 2017-01-26 NOTE — Assessment & Plan Note (Signed)
Tolerating statin, encouraged heart healthy diet, avoid trans fats, minimize simple carbs and saturated fats. Increase exercise as tolerated 

## 2017-01-26 NOTE — Progress Notes (Signed)
Subjective:  I acted as a Education administrator for Dr. Charlett Blake. [name], [place]  Patient ID: Cindy [name], female    DOB: October 05, 1945, 71 y.o.   MRN: [id number]  No chief complaint on file.   HPI  Patient is in today for a 6 month follow up and Is doing well today. She was on a vacation with some friends when she started laughing very hard choking and had a syncopal episode. Workup is negative and ultimately was decided it was a vasovagal episode. It has not recurred she has no other concerns at today's visit except for some mild postnasal drip which started just last night. No fevers or chills. No other acute concerns or hospitalizations. Denies CP/palp/SOB/HA/congestion/fevers/GI or GU c/o. Taking meds as prescribed  Patient Care Team: Mosie Lukes, MD as PCP - General (Family Medicine) Regina Eck, CNM as Referring Physician (Certified Nurse Midwife) Deneise Lever, MD as Consulting Physician (Pulmonary Disease) Jaclyn Prime, [place] as Referring Physician (Chiropractic Medicine) Marica Otter, [place] (Optometry) Crista Luria, MD as Consulting Physician (Dermatology) Elam Dutch, MD as Consulting Physician (Vascular Surgery) Monna Fam, MD as Consulting Physician (Ophthalmology)   Past Medical History:  Diagnosis Date  . Abnormal liver function tests 07/19/2015  . Allergic state 02/01/2010   Qualifier: Diagnosis of  By: Regis Bill MD, Standley Brooking   . Allergy   . Anxiety   . Back pain   . Chicken pox 72 yrs old  . Depressive disorder, not elsewhere classified   . Diabetes ([place])    diet controlled- no medicines   . Fibroid   . History of fractured kneecap    right   . Hyperglycemia   . Hyperlipidemia   . Infertility, female   . Joint pain   . Knee pain, left 10/05/2011   Follows with Dr Margarette Canada  . Measles as a child  . Medicare annual wellness visit, subsequent 07/06/2014  . OA (osteoarthritis) of knee    left knee  . Obesity 10/05/2011  . Obesity 10/05/2011  . Osteoarthritis     . Osteopenia 06/26/2014  . Other and unspecified hyperlipidemia   . Overweight 10/05/2011  . Overweight(278.02) 10/05/2011  . Sleep apnea     On CPAP  . Sleep apnea   . Stye 11/19/2012  . Syncope 01/26/2017  . Thyroid disease    h/o hyperthyroid treated with radioactive iodine? from 16 to 20  . Type 2 diabetes mellitus with hyperlipidemia ([place])   . Varicose vein of leg     Past Surgical History:  Procedure Laterality Date  . BACK SURGERY     L5 discectomy, 2002. helpful  . CERVICAL BIOPSY  W/ LOOP ELECTRODE EXCISION    . CESAREAN SECTION  1987  . COLONOSCOPY    . CPAP    . KNEE SURGERY Right 2006   R knee fx. patella  . LEEP    . stitches in foot Right     Family History  Problem Relation Age of Onset  . Hyperlipidemia Mother   . Dementia Mother   . Obesity Mother   . Hyperlipidemia Father   . Heart disease Father        bypass, CAD  . Sleep apnea Father   . Hyperlipidemia Sister   . Diabetes Sister        type 2  . Colon polyps Sister   . Dementia Maternal Grandmother        alzheimer  . Alcohol abuse Maternal Grandfather   .  Diabetes Paternal Grandmother        type 2?  . Colon cancer Neg Hx   . Esophageal cancer Neg Hx   . Rectal cancer Neg Hx   . Stomach cancer Neg Hx     Social History   Social History  . Marital status: Divorced    Spouse name: N/A  . Number of children: 1  . Years of education: N/A   Occupational History  . licensed Clinical Social worker Conservation officer[employer]   Social History Main Topics  . Smoking status: Never Smoker  . Smokeless tobacco: Never Used  . Alcohol use 0.6 oz/week    1 Glasses of wine per week  . Drug use: No  . Sexual activity: No   Other Topics Concern  . Not on file   Social History Narrative   Recently divorced separated fall 2012    Now living in [place] area .   Never smoker   Regular exercise   Sleep 01/12/29 up at 6             Outpatient Medications Prior to Visit   Medication Sig Dispense Refill  . aspirin 81 MG tablet Take 81 mg by mouth daily.    . cholecalciferol (VITAMIN D) 1000 units tablet Take 2,000 Units by mouth daily.     . cycloSPORINE (RESTASIS) 0.05 % ophthalmic emulsion Place 1 drop into both eyes daily as needed. Reported on 07/09/2015    . DULoxetine (CYMBALTA) 30 MG capsule TAKE ONE CAPSULE BY MOUTH DAILY (Patient taking differently: every other day) 30 capsule 0  . fluticasone (FLONASE) 50 MCG/ACT nasal spray Place 1 spray into both nostrils as needed.     Marland Kitchen levocetirizine (XYZAL) 5 MG tablet Take 5 mg by mouth as needed.   3  . metFORMIN (GLUCOPHAGE) 500 MG tablet Take 1 tablet (500 mg total) by mouth daily with breakfast. 30 tablet 0  . pravastatin (PRAVACHOL) 80 MG tablet Take 0.5 tablets (40 mg total) by mouth daily. 45 tablet 0  . Turmeric 500 MG CAPS Take by mouth.     No facility-administered medications prior to visit.     Allergies  Allergen Reactions  . Codeine Other (See Comments)    hallucinations  . Crestor [Rosuvastatin]     REACTION: myalgias  . Other Other (See Comments)    NOVACAINE- blisters left side of mouth after receiving   . Amoxicillin Rash  . Erythromycin Other (See Comments)    cramps  . Sulfa Antibiotics Rash  . Sulfonamide Derivatives Rash    Review of Systems  Constitutional: Negative for fever and malaise/fatigue.  HENT: Negative for congestion.   Eyes: Negative for blurred vision.  Respiratory: Negative for shortness of breath.   Cardiovascular: Negative for chest pain, palpitations and leg swelling.  Gastrointestinal: Negative for abdominal pain, blood in stool and nausea.  Genitourinary: Negative for dysuria and frequency.  Musculoskeletal: Negative for falls.  Skin: Negative for rash.  Neurological: Negative for dizziness, loss of consciousness and headaches.  Endo/Heme/Allergies: Negative for environmental allergies.  Psychiatric/Behavioral: Negative for depression. The patient is  not nervous/anxious.        Objective:    Physical Exam  BP (!) 102/48 (BP Location: Left Arm, Patient Position: Sitting, Cuff Size: Normal)   Pulse 70   Temp 98.3 F (36.8 C) (Oral)   Wt 178 lb (80.7 kg)   LMP 04/04/2000   BMI 29.62 kg/m  Wt Readings from Last 3 Encounters:  01/26/17 178 lb (80.7 kg)  11/07/16 179 lb (81.2 kg)  10/04/16 171 lb (77.6 kg)   BP Readings from Last 3 Encounters:  01/26/17 (!) 102/48  11/07/16 105/61  10/04/16 (!) 145/91     Immunization History  Administered Date(s) Administered  . Influenza Split 06/02/2012, 12/30/2014  . Influenza Whole 03/19/2007  . Influenza, High Dose Seasonal PF 01/26/2017  . Influenza,inj,Quad PF,6+ Mos 01/02/2014, 12/08/2015  . Influenza-Unspecified 12/03/2012  . Pneumococcal Conjugate-13 01/09/2015  . Pneumococcal Polysaccharide-23 07/25/2016  . Td 11/08/2005  . Tdap 06/02/2012  . Zoster 10/05/2011    Health Maintenance  Topic Date Due  . FOOT EXAM  06/26/2015  . URINE MICROALBUMIN  01/09/2016  . OPHTHALMOLOGY EXAM  09/15/2016  . INFLUENZA VACCINE  11/02/2016  . MAMMOGRAM  03/11/2017  . HEMOGLOBIN A1C  07/11/2017  . TETANUS/TDAP  06/03/2022  . COLONOSCOPY  04/18/2026  . DEXA SCAN  Completed  . Hepatitis C Screening  Completed  . PNA vac Low Risk Adult  Completed    Lab Results  Component Value Date   WBC 6.2 01/10/2017   HGB 13.2 01/10/2017   HCT 41.3 01/10/2017   PLT 308.0 08/05/2016   GLUCOSE 101 (H) 01/10/2017   CHOL 241 (H) 01/10/2017   TRIG 81 01/10/2017   HDL 60 01/10/2017   LDLDIRECT 145.4 11/14/2012   LDLCALC 165 (H) 01/10/2017   ALT 25 01/10/2017   AST 17 01/10/2017   NA 138 01/10/2017   K 4.9 01/10/2017   CL 101 01/10/2017   CREATININE 0.72 01/10/2017   BUN 24 01/10/2017   CO2 24 01/10/2017   TSH 1.020 01/10/2017   HGBA1C 5.8 (H) 01/10/2017   MICROALBUR <0.7 01/09/2015    Lab Results  Component Value Date   TSH 1.020 01/10/2017   Lab Results  Component Value Date    WBC 6.2 01/10/2017   HGB 13.2 01/10/2017   HCT 41.3 01/10/2017   MCV 92 01/10/2017   PLT 308.0 08/05/2016   Lab Results  Component Value Date   NA 138 01/10/2017   K 4.9 01/10/2017   CO2 24 01/10/2017   GLUCOSE 101 (H) 01/10/2017   BUN 24 01/10/2017   CREATININE 0.72 01/10/2017   BILITOT 0.4 01/10/2017   ALKPHOS 74 01/10/2017   AST 17 01/10/2017   ALT 25 01/10/2017   PROT 6.8 01/10/2017   ALBUMIN 4.2 01/10/2017   CALCIUM 9.9 01/10/2017   GFR 69.53 08/05/2016   Lab Results  Component Value Date   CHOL 241 (H) 01/10/2017   Lab Results  Component Value Date   HDL 60 01/10/2017   Lab Results  Component Value Date   LDLCALC 165 (H) 01/10/2017   Lab Results  Component Value Date   TRIG 81 01/10/2017   Lab Results  Component Value Date   CHOLHDL 4 08/05/2016   Lab Results  Component Value Date   HGBA1C 5.8 (H) 01/10/2017         Assessment & Plan:   Problem List Items Addressed This Visit    Hyperlipidemia    Tolerating statin, encouraged heart healthy diet, avoid trans fats, minimize simple carbs and saturated fats. Increase exercise as tolerated      Obesity    Encouraged DASH diet, decrease po intake and increase exercise as tolerated. Needs 7-8 hours of sleep nightly. Avoid trans fats, eat small, frequent meals every 4-5 hours with lean proteins, complex carbs and healthy fats. Minimize simple carbs, following with bariatric referral  Type 2 diabetes mellitus with hyperlipidemia (H[facility])    hgba1c acceptable, minimize simple carbs. Increase exercise as tolerated. Continue current meds      Vitamin D deficiency    Level wnl. Doing well on current OTC       Other hyperlipidemia    Tolerating statin, encouraged heart healthy diet, avoid trans fats, minimize simple carbs and saturated fats. Increase exercise as tolerated      Syncope    One episode with heavy laughing has not recurred. Work up was negative       Other Visit Diagnoses    Needs  flu shot    -  Primary   Relevant Orders   Flu vaccine HIGH DOSE PF (Fluzone High dose) (Completed)      I am having Ms. Luckow maintain her cycloSPORINE, levocetirizine, fluticasone, cholecalciferol, Turmeric, pravastatin, DULoxetine, aspirin, and metFORMIN.  No orders of the defined types were placed in this encounter.   CMA served as Education administrator during this visit. History, Physical and Plan performed by medical provider. Documentation and orders reviewed and attested to.  Penni Homans, MD

## 2017-01-29 ENCOUNTER — Other Ambulatory Visit: Payer: Self-pay | Admitting: Family Medicine

## 2017-02-07 ENCOUNTER — Ambulatory Visit (INDEPENDENT_AMBULATORY_CARE_PROVIDER_SITE_OTHER): Payer: Medicare HMO | Admitting: Family Medicine

## 2017-02-07 VITALS — BP 117/67 | HR 61 | Temp 98.3°F | Ht 65.0 in | Wt 178.0 lb

## 2017-02-07 DIAGNOSIS — E7849 Other hyperlipidemia: Secondary | ICD-10-CM | POA: Diagnosis not present

## 2017-02-07 DIAGNOSIS — E669 Obesity, unspecified: Secondary | ICD-10-CM

## 2017-02-07 DIAGNOSIS — R7303 Prediabetes: Secondary | ICD-10-CM

## 2017-02-07 DIAGNOSIS — Z6830 Body mass index (BMI) 30.0-30.9, adult: Secondary | ICD-10-CM | POA: Diagnosis not present

## 2017-02-07 MED ORDER — METFORMIN HCL 500 MG PO TABS: 500.0000 mg | ORAL_TABLET | Freq: Every day | ORAL | 0 refills | Status: DC

## 2017-02-08 ENCOUNTER — Telehealth (INDEPENDENT_AMBULATORY_CARE_PROVIDER_SITE_OTHER): Payer: Self-pay | Admitting: Family Medicine

## 2017-02-08 NOTE — Telephone Encounter (Signed)
Spoke with the patient and informed her per Dr Leafy Ro she could take her Metformin after lunch and that there is not a 100 cal snack on the veggie plan. Patient verbalized understanding. Cindy [name], Cindy [name]

## 2017-02-08 NOTE — Telephone Encounter (Signed)
Pt didn't take metformin today after breakfast can she take after lunch?  Is 100 CAL SNACK INLUDED IN VEGETARIAN MEAL PLAN.  SHE DOES NOT USE MY CHART

## 2017-02-08 NOTE — Progress Notes (Signed)
Office: [phone]  /  Fax: [phone]   HPI:   Chief Complaint: OBESITY Cindy Cindy [name] is here to discuss her progress with her obesity treatment plan. She is on the Category 2 plan and is following her eating plan approximately 85-90 % of the time. She states she is doing curves 30 minutes 1 times per week. Cindy Cindy [name] has done well maintaining her weight and has had increased temptations and she is getting bored with her plan and would like to discuss other options.  Her weight is 178 lb (80.7 kg) today and has not lost weight since her last visit. She has lost 7 lbs since starting treatment with [name].  Hyperlipidemia Cindy Cindy [name] has hyperlipidemia and has been attempting to improve with diet. Her last LDL elevated at 165, she is on Pravachol. She denies any chest pain, claudication or myalgias.  Pre-Diabetes Cindy Cindy [name] has Cindy [name] diagnosis of pre-diabetes based on her elevated Hgb A1c and was informed this puts her at greater risk of developing diabetes. She is stable on metformin, she is doing well on diet and continues to work on diet and exercise to decrease risk of diabetes. She denies nausea or hypoglycemia.  ALLERGIES: Allergies  Allergen Reactions  . Codeine Other (See Comments)    hallucinations  . Crestor [Rosuvastatin]     REACTION: myalgias  . Other Other (See Comments)    NOVACAINE- blisters left side of mouth after receiving   . Amoxicillin Rash  . Erythromycin Other (See Comments)    cramps  . Sulfa Antibiotics Rash  . Sulfonamide Derivatives Rash    MEDICATIONS: Current Outpatient Medications on File Prior to Visit  Medication Sig Dispense Refill  . aspirin 81 MG tablet Take 81 mg by mouth daily.    . cholecalciferol (VITAMIN D) 1000 units tablet Take 2,000 Units by mouth daily.     . cycloSPORINE (RESTASIS) 0.05 % ophthalmic emulsion Place 1 drop into both eyes daily as needed. Reported on 07/09/2015    . DULoxetine (CYMBALTA) 30 MG capsule TAKE ONE CAPSULE BY MOUTH DAILY (Patient  taking differently: Take one capsule by mouth every other day) 30 capsule 0  . fluticasone (FLONASE) 50 MCG/ACT nasal spray Place 1 spray into both nostrils as needed.     Marland Kitchen levocetirizine (XYZAL) 5 MG tablet Take 5 mg by mouth as needed.   3  . pravastatin (PRAVACHOL) 80 MG tablet Take 0.5 tablets (40 mg total) by mouth daily. 45 tablet 0  . Turmeric 500 MG CAPS Take by mouth.     No current facility-administered medications on file prior to visit.     PAST MEDICAL HISTORY: Past Medical History:  Diagnosis Date  . Abnormal liver function tests 07/19/2015  . Allergic state 02/01/2010   Qualifier: Diagnosis of  By: Regis Bill MD, Standley Brooking   . Allergy   . Anxiety   . Back pain   . Chicken pox 71 yrs old  . Depressive disorder, not elsewhere classified   . Diabetes ([place])    diet controlled- no medicines   . Fibroid   . History of fractured kneecap    right   . Hyperglycemia   . Hyperlipidemia   . Infertility, female   . Joint pain   . Knee pain, left 10/05/2011   Follows with Dr Margarette Canada  . Measles as Cindy [name] child  . Medicare annual wellness visit, subsequent 07/06/2014  . OA (osteoarthritis) of knee    left knee  . Obesity 10/05/2011  . Obesity 10/05/2011  .  Osteoarthritis   . Osteopenia 06/26/2014  . Other and unspecified hyperlipidemia   . Overweight 10/05/2011  . Overweight(278.02) 10/05/2011  . Sleep apnea     On CPAP  . Sleep apnea   . Stye 11/19/2012  . Syncope 01/26/2017  . Thyroid disease    h/o hyperthyroid treated with radioactive iodine? from 16 to 20  . Type 2 diabetes mellitus with hyperlipidemia ([place])   . Varicose vein of leg     PAST SURGICAL HISTORY: Past Surgical History:  Procedure Laterality Date  . BACK SURGERY     L5 discectomy, 2002. helpful  . CERVICAL BIOPSY  W/ LOOP ELECTRODE EXCISION    . CESAREAN SECTION  1987  . COLONOSCOPY    . CPAP    . KNEE SURGERY Right 2006   R knee fx. patella  . LEEP    . stitches in foot Right     SOCIAL HISTORY: Social  History   Tobacco Use  . Smoking status: Never Smoker  . Smokeless tobacco: Never Used  Substance Use Topics  . Alcohol use: Yes    Alcohol/week: 0.6 oz    Types: 1 Glasses of wine per week  . Drug use: No    FAMILY HISTORY: Family History  Problem Relation Age of Onset  . Hyperlipidemia Mother   . Dementia Mother   . Obesity Mother   . Hyperlipidemia Father   . Heart disease Father        bypass, CAD  . Sleep apnea Father   . Hyperlipidemia Sister   . Diabetes Sister        type 2  . Colon polyps Sister   . Dementia Maternal Grandmother        alzheimer  . Alcohol abuse Maternal Grandfather   . Diabetes Paternal Grandmother        type 2?  . Colon cancer Neg Hx   . Esophageal cancer Neg Hx   . Rectal cancer Neg Hx   . Stomach cancer Neg Hx     ROS: Review of Systems  Constitutional: Negative for weight loss.  Cardiovascular: Negative for chest pain and claudication.  Gastrointestinal: Negative for nausea.  Musculoskeletal: Negative for myalgias.  Endo/Heme/Allergies:       Negative hypoglycemia    PHYSICAL EXAM: Blood pressure 117/67, pulse 61, temperature 98.3 F (36.8 C), temperature source Oral, height 5\' 5"  (1.651 m), weight 178 lb (80.7 kg), last menstrual period 04/04/2000, SpO2 96 %. Body mass index is 29.62 kg/m. Physical Exam  Constitutional: She is oriented to person, place, and time. She appears well-developed and well-nourished.  Cardiovascular: Normal rate.  Pulmonary/Chest: Effort normal.  Musculoskeletal: Normal range of motion.  Neurological: She is oriented to person, place, and time.  Skin: Skin is warm and dry.  Psychiatric: She has Cindy [name] normal mood and affect. Her behavior is normal.  Vitals reviewed.   RECENT LABS AND TESTS: BMET    Component Value Date/Time   NA 138 01/10/2017 0938   K 4.9 01/10/2017 0938   CL 101 01/10/2017 0938   CO2 24 01/10/2017 0938   GLUCOSE 101 (H) 01/10/2017 0938   GLUCOSE 112 (H) 08/05/2016 0956    BUN 24 01/10/2017 0938   CREATININE 0.72 01/10/2017 0938   CALCIUM 9.9 01/10/2017 0938   GFRNONAA 86 01/10/2017 0938   GFRAA 99 01/10/2017 0938   Lab Results  Component Value Date   HGBA1C 5.8 (H) 01/10/2017   HGBA1C 6.1 08/05/2016   HGBA1C 5.9 07/06/2015  HGBA1C 5.6 01/02/2015   HGBA1C 6.2 07/04/2014   Lab Results  Component Value Date   INSULIN 11.7 01/10/2017   CBC    Component Value Date/Time   WBC 6.2 01/10/2017 0938   WBC 7.4 08/05/2016 0956   RBC 4.51 01/10/2017 0938   RBC 4.56 08/05/2016 0956   HGB 13.2 01/10/2017 0938   HCT 41.3 01/10/2017 0938   PLT 308.0 08/05/2016 0956   MCV 92 01/10/2017 0938   MCH 29.3 01/10/2017 0938   MCHC 32.0 01/10/2017 0938   MCHC 33.6 08/05/2016 0956   RDW 14.0 01/10/2017 0938   LYMPHSABS 2.1 01/10/2017 0938   MONOABS 0.7 08/05/2016 0956   EOSABS 0.2 01/10/2017 0938   BASOSABS 0.1 01/10/2017 0938   Iron/TIBC/Ferritin/ %Sat No results found for: IRON, TIBC, FERRITIN, IRONPCTSAT Lipid Panel     Component Value Date/Time   CHOL 241 (H) 01/10/2017 0938   TRIG 81 01/10/2017 0938   TRIG 94 04/07/2006 1019   HDL 60 01/10/2017 0938   CHOLHDL 4 08/05/2016 0956   VLDL 20.0 08/05/2016 0956   LDLCALC 165 (H) 01/10/2017 0938   LDLDIRECT 145.4 11/14/2012 0823   Hepatic Function Panel     Component Value Date/Time   PROT 6.8 01/10/2017 0938   ALBUMIN 4.2 01/10/2017 0938   AST 17 01/10/2017 0938   ALT 25 01/10/2017 0938   ALKPHOS 74 01/10/2017 0938   BILITOT 0.4 01/10/2017 0938   BILIDIR 0.1 10/29/2013 0831      Component Value Date/Time   TSH 1.020 01/10/2017 0938   TSH 1.21 08/05/2016 0956   TSH 1.56 07/06/2015 0936    ASSESSMENT AND PLAN: Other hyperlipidemia  Prediabetes - Plan: metFORMIN (GLUCOPHAGE) 500 MG tablet  Class 1 obesity with serious comorbidity and body mass index (BMI) of 30.0 to 30.9 in adult, unspecified obesity type - Starting BMI greater then 30  PLAN:  Hyperlipidemia Cindy Cindy [name] was informed of the  American Heart Association Guidelines emphasizing intensive lifestyle modifications as the first line treatment for hyperlipidemia. We discussed many lifestyle modifications today in depth, and Cindy Cindy [name] will continue to work on decreasing saturated fats such as fatty red meat, butter and many fried foods. She will continue her medications as prescribed and we discussed Cindy [name] lower cholesterol diet in addition to weight loss. She will also increase vegetables and lean protein in her diet and continue to work on exercise and weight loss efforts. Cindy Cindy [name] agrees to follow up with our clinic in 2 weeks.  Pre-Diabetes Cindy Cindy [name] will continue to work on weight loss, exercise, and decreasing simple carbohydrates in her diet to help decrease the risk of diabetes. We dicussed metformin including benefits and risks. She was informed that eating too many simple carbohydrates or too many calories at one sitting increases the likelihood of GI side effects. Cindy Cindy [name] agrees to continue taking metformin 500 mg q AM and we will refill for 1 month. Cindy Cindy [name] agrees to follow up with our clinic in 2 weeks as directed to monitor her progress.  Obesity Cindy Cindy [name] is currently in the action stage of change. As such, her goal is to continue with weight loss efforts She has agreed to change to follow the Pescatarian eating plan Cindy Cindy [name] has been instructed to work up to Cindy [name] goal of 150 minutes of combined cardio and strengthening exercise per week for weight loss and overall health benefits. We discussed the following Behavioral Modification Strategies today: increasing lean protein intake, decreasing simple carbohydrates  and work on meal planning and easy cooking plans  Cindy Cindy [name] has agreed to follow up with our clinic in 2 weeks. She was informed of the importance of frequent follow up visits to maximize her success with intensive lifestyle modifications for her multiple health conditions.  I, Trixie Dredge, am acting as transcriptionist for Dennard Nip, MD  I have reviewed the above documentation for accuracy and completeness, and I agree with the above. -Dennard Nip, MD      Today's visit was # 3 out of 22.  Starting weight: 185 lbs Starting date: 01/10/17 Today's weight : 178 lbs  Today's date: 02/07/2017 Total lbs lost to date: 7 (Patients must lose 7 lbs in the first 6 months to continue with counseling)   ASK: We discussed the diagnosis of obesity with Cindy Cindy [name] today and Cindy Cindy [name] agreed to give [name] permission to discuss obesity behavioral modification therapy today.  ASSESS: Cindy Cindy [name] has the diagnosis of obesity and her BMI today is 29.62 Cindy Cindy [name] is in the action stage of change   ADVISE: Cindy Cindy [name] was educated on the multiple health risks of obesity as well as the benefit of weight loss to improve her health. She was advised of the need for long term treatment and the importance of lifestyle modifications.  AGREE: Multiple dietary modification options and treatment options were discussed and  Cindy Cindy [name] agreed to follow the Pescatarian eating plan We discussed the following Behavioral Modification Strategies today: increasing lean protein intake, decreasing simple carbohydrates  and work on meal planning and easy cooking plans

## 2017-02-20 DIAGNOSIS — M9903 Segmental and somatic dysfunction of lumbar region: Secondary | ICD-10-CM | POA: Diagnosis not present

## 2017-02-20 DIAGNOSIS — M9901 Segmental and somatic dysfunction of cervical region: Secondary | ICD-10-CM | POA: Diagnosis not present

## 2017-02-20 DIAGNOSIS — M5412 Radiculopathy, cervical region: Secondary | ICD-10-CM | POA: Diagnosis not present

## 2017-02-20 DIAGNOSIS — M62830 Muscle spasm of back: Secondary | ICD-10-CM | POA: Diagnosis not present

## 2017-02-21 ENCOUNTER — Other Ambulatory Visit: Payer: Self-pay | Admitting: Family Medicine

## 2017-02-21 ENCOUNTER — Ambulatory Visit (INDEPENDENT_AMBULATORY_CARE_PROVIDER_SITE_OTHER): Payer: Medicare HMO | Admitting: Dietician

## 2017-02-21 ENCOUNTER — Other Ambulatory Visit (INDEPENDENT_AMBULATORY_CARE_PROVIDER_SITE_OTHER): Payer: Self-pay | Admitting: Family Medicine

## 2017-02-21 VITALS — Ht 65.0 in | Wt 174.0 lb

## 2017-02-21 DIAGNOSIS — E669 Obesity, unspecified: Secondary | ICD-10-CM

## 2017-02-21 DIAGNOSIS — Z6830 Body mass index (BMI) 30.0-30.9, adult: Secondary | ICD-10-CM

## 2017-02-21 DIAGNOSIS — Z139 Encounter for screening, unspecified: Secondary | ICD-10-CM

## 2017-02-21 DIAGNOSIS — R7303 Prediabetes: Secondary | ICD-10-CM

## 2017-02-21 DIAGNOSIS — G4733 Obstructive sleep apnea (adult) (pediatric): Secondary | ICD-10-CM | POA: Diagnosis not present

## 2017-02-27 NOTE — Progress Notes (Signed)
  Office: [phone]  /  Fax: [phone]  OBESITY BEHAVIORAL INTERVENTION VISIT  Today's visit was # 4 out of 22.  Starting weight: 185 lbs  Starting date: 01/10/17 Today's weight : Weight: 174 lb (78.9 kg)  Today's date:02/21/17 Total lbs lost to date: 11 lbs (Patients must lose 7 lbs in the first 6 months to continue with counseling)   ASK: We discussed the diagnosis of obesity with Cindy [name] today and Cindy [name] agreed to give [name] permission to discuss obesity behavioral modification therapy today. Cindy [name] is following our pescatarian meal plan approximately 98% of the time.    ASSESS: Cindy [name] has the diagnosis of obesity and her BMI today is 28.96 Cindy [name] is in the action stage of change   ADVISE: Cindy [name] was educated on the multiple health risks of obesity as well as the benefit of weight loss to improve her health. She was advised of the need for long term treatment and the importance of lifestyle modifications. Healthy holiday eating was discussed in detail today and written information was provided.  AGREE: Multiple dietary modification options and treatment options were discussed and  Cindy [name] agreed to follow our protein rich vegetarian plan with fish.  We discussed the following Behavioral Modification Stratagies today: holiday eating strategies

## 2017-03-01 DIAGNOSIS — Z86018 Personal history of other benign neoplasm: Secondary | ICD-10-CM | POA: Diagnosis not present

## 2017-03-01 DIAGNOSIS — Z23 Encounter for immunization: Secondary | ICD-10-CM | POA: Diagnosis not present

## 2017-03-01 DIAGNOSIS — L570 Actinic keratosis: Secondary | ICD-10-CM | POA: Diagnosis not present

## 2017-03-01 DIAGNOSIS — D225 Melanocytic nevi of trunk: Secondary | ICD-10-CM | POA: Diagnosis not present

## 2017-03-01 DIAGNOSIS — D2261 Melanocytic nevi of right upper limb, including shoulder: Secondary | ICD-10-CM | POA: Diagnosis not present

## 2017-03-01 DIAGNOSIS — Q825 Congenital non-neoplastic nevus: Secondary | ICD-10-CM | POA: Diagnosis not present

## 2017-03-01 DIAGNOSIS — L821 Other seborrheic keratosis: Secondary | ICD-10-CM | POA: Diagnosis not present

## 2017-03-01 DIAGNOSIS — D2272 Melanocytic nevi of left lower limb, including hip: Secondary | ICD-10-CM | POA: Diagnosis not present

## 2017-03-06 ENCOUNTER — Ambulatory Visit (INDEPENDENT_AMBULATORY_CARE_PROVIDER_SITE_OTHER): Payer: Medicare HMO | Admitting: Family Medicine

## 2017-03-06 VITALS — BP 110/64 | HR 62 | Temp 98.1°F | Ht 65.0 in | Wt 171.0 lb

## 2017-03-06 DIAGNOSIS — E669 Obesity, unspecified: Secondary | ICD-10-CM | POA: Diagnosis not present

## 2017-03-06 DIAGNOSIS — Z9189 Other specified personal risk factors, not elsewhere classified: Secondary | ICD-10-CM | POA: Diagnosis not present

## 2017-03-06 DIAGNOSIS — R7303 Prediabetes: Secondary | ICD-10-CM | POA: Diagnosis not present

## 2017-03-06 DIAGNOSIS — Z6830 Body mass index (BMI) 30.0-30.9, adult: Secondary | ICD-10-CM

## 2017-03-06 NOTE — Progress Notes (Signed)
Office: [phone]  /  Fax: [phone]   HPI:   Chief Complaint: OBESITY Cindy [name] is here to discuss her progress with her obesity treatment plan. She is on the Pescatarian eating plan and is following her eating plan approximately 95 % of the time. She states she is exercising Zumba for 60 minutes 1 times per week. Cindy [name] continues to do well with weight loss, even over Thanksgiving. She is doing well on her Pescatarian eating plan. Hunger is mostly controlled. She has increased holiday celebrations and is worried about weight gain. Her weight is 171 lb (77.6 kg) today and has had a weight loss of 3 pounds over a period of 4 weeks since her last visit. She has lost 14 lbs since starting treatment with [name].  Pre-Diabetes Cindy [name] has a diagnosis of pre-diabetes based on her elevated Hgb A1c and was informed this puts her at greater risk of developing diabetes. She is taking metformin currently and continues to work on diet and exercise to decrease risk of diabetes. She denies nausea or hypoglycemia.  At risk for diabetes Cindy [name] is at higher than average risk for developing diabetes due to her obesity and pre-diabetes. She currently denies polyuria or polydipsia.  ALLERGIES: Allergies  Allergen Reactions  . Codeine Other (See Comments)    hallucinations  . Crestor [Rosuvastatin]     REACTION: myalgias  . Other Other (See Comments)    NOVACAINE- blisters left side of mouth after receiving   . Amoxicillin Rash  . Erythromycin Other (See Comments)    cramps  . Sulfa Antibiotics Rash  . Sulfonamide Derivatives Rash    MEDICATIONS: Current Outpatient Medications on File Prior to Visit  Medication Sig Dispense Refill  . aspirin 81 MG tablet Take 81 mg by mouth daily.    . cholecalciferol (VITAMIN D) 1000 units tablet Take 2,000 Units by mouth daily.     . cycloSPORINE (RESTASIS) 0.05 % ophthalmic emulsion Place 1 drop into both eyes daily as needed. Reported on 07/09/2015    . DULoxetine  (CYMBALTA) 30 MG capsule TAKE ONE CAPSULE BY MOUTH DAILY (Patient taking differently: Take one capsule by mouth every other day) 30 capsule 0  . fluticasone (FLONASE) 50 MCG/ACT nasal spray Place 1 spray into both nostrils as needed.     Marland Kitchen levocetirizine (XYZAL) 5 MG tablet Take 5 mg by mouth as needed.   3  . metFORMIN (GLUCOPHAGE) 500 MG tablet Take 1 tablet (500 mg total) daily with breakfast by mouth. 30 tablet 0  . pravastatin (PRAVACHOL) 80 MG tablet Take 0.5 tablets (40 mg total) by mouth daily. 45 tablet 0  . Turmeric 500 MG CAPS Take by mouth.     No current facility-administered medications on file prior to visit.     PAST MEDICAL HISTORY: Past Medical History:  Diagnosis Date  . Abnormal liver function tests 07/19/2015  . Allergic state 02/01/2010   Qualifier: Diagnosis of  By: Regis Bill MD, Standley Brooking   . Allergy   . Anxiety   . Back pain   . Chicken pox 71 yrs old  . Depressive disorder, not elsewhere classified   . Diabetes ([place])    diet controlled- no medicines   . Fibroid   . History of fractured kneecap    right   . Hyperglycemia   . Hyperlipidemia   . Infertility, female   . Joint pain   . Knee pain, left 10/05/2011   Follows with Dr Margarette Canada  . Measles as a child  .  Medicare annual wellness visit, subsequent 07/06/2014  . OA (osteoarthritis) of knee    left knee  . Obesity 10/05/2011  . Obesity 10/05/2011  . Osteoarthritis   . Osteopenia 06/26/2014  . Other and unspecified hyperlipidemia   . Overweight 10/05/2011  . Overweight(278.02) 10/05/2011  . Sleep apnea     On CPAP  . Sleep apnea   . Stye 11/19/2012  . Syncope 01/26/2017  . Thyroid disease    h/o hyperthyroid treated with radioactive iodine? from 16 to 20  . Type 2 diabetes mellitus with hyperlipidemia ([place])   . Varicose vein of leg     PAST SURGICAL HISTORY: Past Surgical History:  Procedure Laterality Date  . BACK SURGERY     L5 discectomy, 2002. helpful  . CERVICAL BIOPSY  W/ LOOP ELECTRODE EXCISION     . CESAREAN SECTION  1987  . COLONOSCOPY    . CPAP    . KNEE SURGERY Right 2006   R knee fx. patella  . LEEP    . stitches in foot Right     SOCIAL HISTORY: Social History   Tobacco Use  . Smoking status: Never Smoker  . Smokeless tobacco: Never Used  Substance Use Topics  . Alcohol use: Yes    Alcohol/week: 0.6 oz    Types: 1 Glasses of wine per week  . Drug use: No    FAMILY HISTORY: Family History  Problem Relation Age of Onset  . Hyperlipidemia Mother   . Dementia Mother   . Obesity Mother   . Hyperlipidemia Father   . Heart disease Father        bypass, CAD  . Sleep apnea Father   . Hyperlipidemia Sister   . Diabetes Sister        type 2  . Colon polyps Sister   . Dementia Maternal Grandmother        alzheimer  . Alcohol abuse Maternal Grandfather   . Diabetes Paternal Grandmother        type 2?  . Colon cancer Neg Hx   . Esophageal cancer Neg Hx   . Rectal cancer Neg Hx   . Stomach cancer Neg Hx     ROS: Review of Systems  Constitutional: Positive for weight loss.  Gastrointestinal: Negative for nausea.  Genitourinary: Negative for frequency.  Endo/Heme/Allergies: Negative for polydipsia.       Negative hypoglycemia    PHYSICAL EXAM: Blood pressure 110/64, pulse 62, temperature 98.1 F (36.7 C), height 5\' 5"  (1.651 m), weight 171 lb (77.6 kg), last menstrual period 04/04/2000, SpO2 96 %. Body mass index is 28.46 kg/m. Physical Exam  Constitutional: She is oriented to person, place, and time. She appears well-developed and well-nourished.  Cardiovascular: Normal rate.  Pulmonary/Chest: Effort normal.  Musculoskeletal: Normal range of motion.  Neurological: She is oriented to person, place, and time.  Skin: Skin is warm and dry.  Psychiatric: She has a normal mood and affect. Her behavior is normal.  Vitals reviewed.   RECENT LABS AND TESTS: BMET    Component Value Date/Time   NA 138 01/10/2017 0938   K 4.9 01/10/2017 0938   CL 101  01/10/2017 0938   CO2 24 01/10/2017 0938   GLUCOSE 101 (H) 01/10/2017 0938   GLUCOSE 112 (H) 08/05/2016 0956   BUN 24 01/10/2017 0938   CREATININE 0.72 01/10/2017 0938   CALCIUM 9.9 01/10/2017 0938   GFRNONAA 86 01/10/2017 0938   GFRAA 99 01/10/2017 0938   Lab Results  Component  Value Date   HGBA1C 5.8 (H) 01/10/2017   HGBA1C 6.1 08/05/2016   HGBA1C 5.9 07/06/2015   HGBA1C 5.6 01/02/2015   HGBA1C 6.2 07/04/2014   Lab Results  Component Value Date   INSULIN 11.7 01/10/2017   CBC    Component Value Date/Time   WBC 6.2 01/10/2017 0938   WBC 7.4 08/05/2016 0956   RBC 4.51 01/10/2017 0938   RBC 4.56 08/05/2016 0956   HGB 13.2 01/10/2017 0938   HCT 41.3 01/10/2017 0938   PLT 308.0 08/05/2016 0956   MCV 92 01/10/2017 0938   MCH 29.3 01/10/2017 0938   MCHC 32.0 01/10/2017 0938   MCHC 33.6 08/05/2016 0956   RDW 14.0 01/10/2017 0938   LYMPHSABS 2.1 01/10/2017 0938   MONOABS 0.7 08/05/2016 0956   EOSABS 0.2 01/10/2017 0938   BASOSABS 0.1 01/10/2017 0938   Iron/TIBC/Ferritin/ %Sat No results found for: IRON, TIBC, FERRITIN, IRONPCTSAT Lipid Panel     Component Value Date/Time   CHOL 241 (H) 01/10/2017 0938   TRIG 81 01/10/2017 0938   TRIG 94 04/07/2006 1019   HDL 60 01/10/2017 0938   CHOLHDL 4 08/05/2016 0956   VLDL 20.0 08/05/2016 0956   LDLCALC 165 (H) 01/10/2017 0938   LDLDIRECT 145.4 11/14/2012 0823   Hepatic Function Panel     Component Value Date/Time   PROT 6.8 01/10/2017 0938   ALBUMIN 4.2 01/10/2017 0938   AST 17 01/10/2017 0938   ALT 25 01/10/2017 0938   ALKPHOS 74 01/10/2017 0938   BILITOT 0.4 01/10/2017 0938   BILIDIR 0.1 10/29/2013 0831      Component Value Date/Time   TSH 1.020 01/10/2017 0938   TSH 1.21 08/05/2016 0956   TSH 1.56 07/06/2015 0936    ASSESSMENT AND PLAN: Prediabetes  At risk for diabetes mellitus  Class 1 obesity with serious comorbidity and body mass index (BMI) of 30.0 to 30.9 in adult, unspecified obesity type -  starting BMI greater then 30  PLAN:  Pre-Diabetes Cindy [name] will continue to work on weight loss, exercise, and decreasing simple carbohydrates in her diet to help decrease the risk of diabetes. We dicussed metformin including benefits and risks. She was informed that eating too many simple carbohydrates or too many calories at one sitting increases the likelihood of GI side effects. Cindy [name] agrees to continue metformin for now and a prescription was not written today. We will recheck labs in 1 month and Cindy [name] agreed to follow up with [name] as directed to monitor her progress.  Diabetes risk counseling Cindy [name] was given extended (15 minutes) diabetes prevention counseling today. She is 71 y.o. female and has risk factors for diabetes including obesity and pre-diabetes. We discussed intensive lifestyle modifications today with an emphasis on weight loss as well as increasing exercise and decreasing simple carbohydrates in her diet.  Obesity Cindy [name] is currently in the action stage of change. As such, her goal is to continue with weight loss efforts She has agreed to follow the Pescatarian eating plan Cindy [name] has been instructed to work up to a goal of 150 minutes of combined cardio and strengthening exercise per week or increase Zumba to 2 times per week for weight loss and overall health benefits. We discussed the following Behavioral Modification Strategies today: no skipping meals, travel eating strategies and holiday eating strategies   Cindy [name] has agreed to follow up with our clinic in 2 weeks. She was informed of the importance of frequent follow up visits to maximize her success with intensive lifestyle  modifications for her multiple health conditions.  I, Doreene Nest, am acting as transcriptionist for Dennard Nip, MD  I have reviewed the above documentation for accuracy and completeness, and I agree with the above. -Dennard Nip, MD    OBESITY BEHAVIORAL INTERVENTION VISIT  Today's visit was #  4 out of 22.  Starting weight: 185 lbs Starting date: 01/10/17 Today's weight : 171 lbs Today's date: 03/06/2017 Total lbs lost to date: 14 (Patients must lose 7 lbs in the first 6 months to continue with counseling)   ASK: We discussed the diagnosis of obesity with Cindy [name] today and Cindy [name] agreed to give [name] permission to discuss obesity behavioral modification therapy today.  ASSESS: Cindy [name] has the diagnosis of obesity and her BMI today is 28.46 Cindy [name] is in the action stage of change   ADVISE: Cindy [name] was educated on the multiple health risks of obesity as well as the benefit of weight loss to improve her health. She was advised of the need for long term treatment and the importance of lifestyle modifications.  AGREE: Multiple dietary modification options and treatment options were discussed and  Melony agreed to follow the Pescatarian eating plan We discussed the following Behavioral Modification Strategies today: no skipping meals, travel eating strategies and holiday eating strategies

## 2017-03-20 ENCOUNTER — Ambulatory Visit (INDEPENDENT_AMBULATORY_CARE_PROVIDER_SITE_OTHER): Payer: Medicare HMO | Admitting: Family Medicine

## 2017-03-20 ENCOUNTER — Other Ambulatory Visit: Payer: Self-pay | Admitting: Family Medicine

## 2017-03-20 ENCOUNTER — Other Ambulatory Visit (INDEPENDENT_AMBULATORY_CARE_PROVIDER_SITE_OTHER): Payer: Self-pay | Admitting: Family Medicine

## 2017-03-20 VITALS — BP 102/59 | HR 68 | Temp 97.9°F | Ht 65.0 in | Wt 168.0 lb

## 2017-03-20 DIAGNOSIS — E669 Obesity, unspecified: Secondary | ICD-10-CM | POA: Diagnosis not present

## 2017-03-20 DIAGNOSIS — R7303 Prediabetes: Secondary | ICD-10-CM

## 2017-03-20 DIAGNOSIS — Z9189 Other specified personal risk factors, not elsewhere classified: Secondary | ICD-10-CM | POA: Diagnosis not present

## 2017-03-20 DIAGNOSIS — Z6830 Body mass index (BMI) 30.0-30.9, adult: Secondary | ICD-10-CM | POA: Diagnosis not present

## 2017-03-20 MED ORDER — METFORMIN HCL 500 MG PO TABS: 500.0000 mg | ORAL_TABLET | Freq: Every day | ORAL | 0 refills | Status: DC

## 2017-03-20 NOTE — Progress Notes (Signed)
Office: [phone]  /  Fax: [phone]   HPI:   Chief Complaint: OBESITY Cindy [name] is here to discuss her progress with her obesity treatment plan. She is on the Pescatarian eating plan and is following her eating plan approximately 80 % of the time. She states she is doing zumba for 60 minutes 3 times per week. Cindy [name] continues to do well with weight loss. She is getting ready to travel for 3 weeks and she is worried about weight gain.  Her weight is 168 lb (76.2 kg) today and has had a weight loss of 3 pounds over a period of 2 weeks since her last visit. She has lost 17 lbs since starting treatment with [name].  Pre-Diabetes Cindy [name] has a diagnosis of pre-diabetes based on her elevated Hgb A1c and was informed this puts her at greater risk of developing diabetes. She is stable on metformin and diet prescription and continues to work on diet and exercise to decrease risk of diabetes. She denies nausea, vomiting, or hypoglycemia and she is doing well decreasing simple carbohydrates.  At risk for diabetes Cindy [name] is at higher than averagerisk for developing diabetes due to her obesity and pre-diabetes. She currently denies polyuria or polydipsia.  ALLERGIES: Allergies  Allergen Reactions  . Codeine Other (See Comments)    hallucinations  . Crestor [Rosuvastatin]     REACTION: myalgias  . Other Other (See Comments)    NOVACAINE- blisters left side of mouth after receiving   . Amoxicillin Rash  . Erythromycin Other (See Comments)    cramps  . Sulfa Antibiotics Rash  . Sulfonamide Derivatives Rash    MEDICATIONS: Current Outpatient Medications on File Prior to Visit  Medication Sig Dispense Refill  . aspirin 81 MG tablet Take 81 mg by mouth daily.    . cholecalciferol (VITAMIN D) 1000 units tablet Take 2,000 Units by mouth daily.     . cycloSPORINE (RESTASIS) 0.05 % ophthalmic emulsion Place 1 drop into both eyes daily as needed. Reported on 07/09/2015    . DULoxetine (CYMBALTA) 30 MG  capsule TAKE ONE CAPSULE BY MOUTH DAILY (Patient taking differently: Take one capsule by mouth every other day) 30 capsule 0  . fluticasone (FLONASE) 50 MCG/ACT nasal spray Place 1 spray into both nostrils as needed.     Marland Kitchen levocetirizine (XYZAL) 5 MG tablet Take 5 mg by mouth as needed.   3  . metFORMIN (GLUCOPHAGE) 500 MG tablet Take 1 tablet (500 mg total) daily with breakfast by mouth. 30 tablet 0  . Turmeric 500 MG CAPS Take by mouth.     No current facility-administered medications on file prior to visit.     PAST MEDICAL HISTORY: Past Medical History:  Diagnosis Date  . Abnormal liver function tests 07/19/2015  . Allergic state 02/01/2010   Qualifier: Diagnosis of  By: Regis Bill MD, Standley Brooking   . Allergy   . Anxiety   . Back pain   . Chicken pox 71 yrs old  . Depressive disorder, not elsewhere classified   . Diabetes ([place])    diet controlled- no medicines   . Fibroid   . History of fractured kneecap    right   . Hyperglycemia   . Hyperlipidemia   . Infertility, female   . Joint pain   . Knee pain, left 10/05/2011   Follows with Dr Margarette Canada  . Measles as a child  . Medicare annual wellness visit, subsequent 07/06/2014  . OA (osteoarthritis) of knee    left  knee  . Obesity 10/05/2011  . Obesity 10/05/2011  . Osteoarthritis   . Osteopenia 06/26/2014  . Other and unspecified hyperlipidemia   . Overweight 10/05/2011  . Overweight(278.02) 10/05/2011  . Sleep apnea     On CPAP  . Sleep apnea   . Stye 11/19/2012  . Syncope 01/26/2017  . Thyroid disease    h/o hyperthyroid treated with radioactive iodine? from 16 to 20  . Type 2 diabetes mellitus with hyperlipidemia ([place])   . Varicose vein of leg     PAST SURGICAL HISTORY: Past Surgical History:  Procedure Laterality Date  . BACK SURGERY     L5 discectomy, 2002. helpful  . CERVICAL BIOPSY  W/ LOOP ELECTRODE EXCISION    . CESAREAN SECTION  1987  . COLONOSCOPY    . CPAP    . KNEE SURGERY Right 2006   R knee fx. patella  . LEEP     . stitches in foot Right     SOCIAL HISTORY: Social History   Tobacco Use  . Smoking status: Never Smoker  . Smokeless tobacco: Never Used  Substance Use Topics  . Alcohol use: Yes    Alcohol/week: 0.6 oz    Types: 1 Glasses of wine per week  . Drug use: No    FAMILY HISTORY: Family History  Problem Relation Age of Onset  . Hyperlipidemia Mother   . Dementia Mother   . Obesity Mother   . Hyperlipidemia Father   . Heart disease Father        bypass, CAD  . Sleep apnea Father   . Hyperlipidemia Sister   . Diabetes Sister        type 2  . Colon polyps Sister   . Dementia Maternal Grandmother        alzheimer  . Alcohol abuse Maternal Grandfather   . Diabetes Paternal Grandmother        type 2?  . Colon cancer Neg Hx   . Esophageal cancer Neg Hx   . Rectal cancer Neg Hx   . Stomach cancer Neg Hx     ROS: Review of Systems  Constitutional: Positive for weight loss.  Gastrointestinal: Negative for nausea and vomiting.  Genitourinary: Negative for frequency.  Endo/Heme/Allergies: Negative for polydipsia.       Negative hypoglycemia    PHYSICAL EXAM: Blood pressure (!) 102/59, pulse 68, temperature 97.9 F (36.6 C), temperature source Oral, height 5\' 5"  (1.651 m), weight 168 lb (76.2 kg), last menstrual period 04/04/2000, SpO2 98 %. Body mass index is 27.96 kg/m. Physical Exam  Constitutional: She is oriented to person, place, and time. She appears well-developed and well-nourished.  Cardiovascular: Normal rate.  Pulmonary/Chest: Effort normal.  Musculoskeletal: Normal range of motion.  Neurological: She is oriented to person, place, and time.  Skin: Skin is warm and dry.  Psychiatric: She has a normal mood and affect. Her behavior is normal.  Vitals reviewed.   RECENT LABS AND TESTS: BMET    Component Value Date/Time   NA 138 01/10/2017 0938   K 4.9 01/10/2017 0938   CL 101 01/10/2017 0938   CO2 24 01/10/2017 0938   GLUCOSE 101 (H) 01/10/2017 0938     GLUCOSE 112 (H) 08/05/2016 0956   BUN 24 01/10/2017 0938   CREATININE 0.72 01/10/2017 0938   CALCIUM 9.9 01/10/2017 0938   GFRNONAA 86 01/10/2017 0938   GFRAA 99 01/10/2017 0938   Lab Results  Component Value Date   HGBA1C 5.8 (H) 01/10/2017  HGBA1C 6.1 08/05/2016   HGBA1C 5.9 07/06/2015   HGBA1C 5.6 01/02/2015   HGBA1C 6.2 07/04/2014   Lab Results  Component Value Date   INSULIN 11.7 01/10/2017   CBC    Component Value Date/Time   WBC 6.2 01/10/2017 0938   WBC 7.4 08/05/2016 0956   RBC 4.51 01/10/2017 0938   RBC 4.56 08/05/2016 0956   HGB 13.2 01/10/2017 0938   HCT 41.3 01/10/2017 0938   PLT 308.0 08/05/2016 0956   MCV 92 01/10/2017 0938   MCH 29.3 01/10/2017 0938   MCHC 32.0 01/10/2017 0938   MCHC 33.6 08/05/2016 0956   RDW 14.0 01/10/2017 0938   LYMPHSABS 2.1 01/10/2017 0938   MONOABS 0.7 08/05/2016 0956   EOSABS 0.2 01/10/2017 0938   BASOSABS 0.1 01/10/2017 0938   Iron/TIBC/Ferritin/ %Sat No results found for: IRON, TIBC, FERRITIN, IRONPCTSAT Lipid Panel     Component Value Date/Time   CHOL 241 (H) 01/10/2017 0938   TRIG 81 01/10/2017 0938   TRIG 94 04/07/2006 1019   HDL 60 01/10/2017 0938   CHOLHDL 4 08/05/2016 0956   VLDL 20.0 08/05/2016 0956   LDLCALC 165 (H) 01/10/2017 0938   LDLDIRECT 145.4 11/14/2012 0823   Hepatic Function Panel     Component Value Date/Time   PROT 6.8 01/10/2017 0938   ALBUMIN 4.2 01/10/2017 0938   AST 17 01/10/2017 0938   ALT 25 01/10/2017 0938   ALKPHOS 74 01/10/2017 0938   BILITOT 0.4 01/10/2017 0938   BILIDIR 0.1 10/29/2013 0831      Component Value Date/Time   TSH 1.020 01/10/2017 0938   TSH 1.21 08/05/2016 0956   TSH 1.56 07/06/2015 0936    ASSESSMENT AND PLAN: Prediabetes - Plan: metFORMIN (GLUCOPHAGE) 500 MG tablet  At risk for diabetes mellitus  Class 1 obesity with serious comorbidity and body mass index (BMI) of 30.0 to 30.9 in adult, unspecified obesity type - Starting BMI was greater than 30    PLAN:  Pre-Diabetes Cindy [name] will continue to work on weight loss, exercise, and decreasing simple carbohydrates in her diet to help decrease the risk of diabetes. We dicussed metformin including benefits and risks. She was informed that eating too many simple carbohydrates or too many calories at one sitting increases the likelihood of GI side effects. Cindy [name] agrees to continue taking metformin 500 mg q AM #30 and we will refill for 1 month. Cindy [name] agrees to follow up with our clinic in 3 weeks as directed to monitor her progress.  Diabetes risk counselling Cindy [name] was given extended (15 minutes) diabetes prevention counseling today. She is 71 y.o. female and has risk factors for diabetes including obesity and pre-diabetes. We discussed intensive lifestyle modifications today with an emphasis on weight loss as well as increasing exercise and decreasing simple carbohydrates in her diet.  Obesity Cindy [name] is currently in the action stage of change. As such, her goal is to continue with weight loss efforts She has agreed to follow the Pescatarian eating plan Cindy [name] has been instructed to work up to a goal of 150 minutes of combined cardio and strengthening exercise per week for weight loss and overall health benefits. We discussed the following Behavioral Modification Strategies today: dealing with family or coworker sabotage, holiday eating strategies, and travel eating strategies    Cindy [name] has agreed to follow up with our clinic in 3 weeks. She was informed of the importance of frequent follow up visits to maximize her success with intensive lifestyle modifications for her multiple health  conditions.  I, Cindy [name], am acting as transcriptionist for Cindy Nip, MD  I have reviewed the above documentation for accuracy and completeness, and I agree with the above. -Cindy Nip, MD     Today's visit was # 5 out of 22.  Starting weight: 185 lbs Starting date: 01/10/17 Today's weight : 168 lbs   Today's date: 03/20/2017 Total lbs lost to date: 17 (Patients must lose 7 lbs in the first 6 months to continue with counseling)   ASK: We discussed the diagnosis of obesity with Cindy [name] today and Cindy [name] agreed to give [name] permission to discuss obesity behavioral modification therapy today.  ASSESS: Cindy [name] has the diagnosis of obesity and her BMI today is 27.96 Cindy [name] is in the action stage of change   ADVISE: Cindy [name] was educated on the multiple health risks of obesity as well as the benefit of weight loss to improve her health. She was advised of the need for long term treatment and the importance of lifestyle modifications.  AGREE: Multiple dietary modification options and treatment options were discussed and  Cindy [name] agreed to follow the Pescatarian eating plan We discussed the following Behavioral Modification Strategies today: dealing with family or coworker sabotage, holiday eating strategies, and travel eating strategies

## 2017-03-23 DIAGNOSIS — G4733 Obstructive sleep apnea (adult) (pediatric): Secondary | ICD-10-CM | POA: Diagnosis not present

## 2017-04-03 DIAGNOSIS — G4733 Obstructive sleep apnea (adult) (pediatric): Secondary | ICD-10-CM | POA: Diagnosis not present

## 2017-04-10 ENCOUNTER — Ambulatory Visit
Admission: RE | Admit: 2017-04-10 | Discharge: 2017-04-10 | Disposition: A | Discharge status: Home / Self Care | Payer: Medicare HMO | Source: Ambulatory Visit | Attending: Family Medicine | Admitting: Family Medicine

## 2017-04-10 ENCOUNTER — Ambulatory Visit (INDEPENDENT_AMBULATORY_CARE_PROVIDER_SITE_OTHER): Payer: Medicare HMO | Admitting: Family Medicine

## 2017-04-10 VITALS — BP 104/65 | HR 70 | Temp 97.8°F | Ht 65.0 in | Wt 170.0 lb

## 2017-04-10 DIAGNOSIS — Z1231 Encounter for screening mammogram for malignant neoplasm of breast: Secondary | ICD-10-CM | POA: Diagnosis not present

## 2017-04-10 DIAGNOSIS — Z9189 Other specified personal risk factors, not elsewhere classified: Secondary | ICD-10-CM | POA: Diagnosis not present

## 2017-04-10 DIAGNOSIS — Z6839 Body mass index (BMI) 39.0-39.9, adult: Secondary | ICD-10-CM

## 2017-04-10 DIAGNOSIS — R7303 Prediabetes: Secondary | ICD-10-CM | POA: Diagnosis not present

## 2017-04-10 DIAGNOSIS — Z139 Encounter for screening, unspecified: Secondary | ICD-10-CM

## 2017-04-10 MED ORDER — METFORMIN HCL 500 MG PO TABS: 500.0000 mg | ORAL_TABLET | Freq: Every day | ORAL | 0 refills | Status: DC

## 2017-04-11 NOTE — Progress Notes (Signed)
Office: [phone]  /  Fax: [phone]   HPI:   Chief Complaint: OBESITY Cindy [name] is here to discuss Cindy [name] progress with Cindy [name] obesity treatment plan. Cindy [name] is on the Pescartarian eating plan and is following Cindy [name] eating plan approximately 40 % of the time. Cindy [name] states Cindy [name] is exercising 0 minutes 0 times per week. Cindy [name] has been traveling for Christmas holidays to [place], [place], 3 nights on the road and [place] for 3 weeks. Cindy [name] has decreased exercise due to traveling but Cindy [name] is ready to start back to zumba classes.  Cindy [name] weight is 170 lb (77.1 kg) today and has gained 2 pounds since Cindy [name] last visit. Cindy [name] has lost 15 lbs since starting treatment with [name].  Pre-Diabetes Cindy [name] has a diagnosis of pre-diabetes based on Cindy [name] elevated Hgb A1c and was informed this puts Cindy [name] at greater risk of developing diabetes. Cindy [name] is stable, sometimes forgetting Cindy [name] metformin but Cindy [name] notes decrease in polyphagia when Cindy [name] takes Cindy [name] metformin. Cindy [name] continues to work on diet and exercise to decrease risk of diabetes. Cindy [name] denies nausea or hypoglycemia.  At risk for diabetes Cindy [name] is at higher than average risk for developing diabetes due to Cindy [name] obesity and pre-diabetes. Cindy [name] currently denies polyuria or polydipsia.  ALLERGIES: Allergies  Allergen Reactions  . Codeine Other (See Comments)    hallucinations  . Crestor [Rosuvastatin]     REACTION: myalgias  . Other Other (See Comments)    NOVACAINE- blisters left side of mouth after receiving   . Amoxicillin Rash  . Erythromycin Other (See Comments)    cramps  . Sulfa Antibiotics Rash  . Sulfonamide Derivatives Rash    MEDICATIONS: Current Outpatient Medications on File Prior to Visit  Medication Sig Dispense Refill  . aspirin 81 MG tablet Take 81 mg by mouth daily.    . cholecalciferol (VITAMIN D) 1000 units tablet Take 2,000 Units by mouth daily.     . cycloSPORINE (RESTASIS) 0.05 % ophthalmic emulsion Place 1 drop into both eyes daily as needed. Reported on  07/09/2015    . DULoxetine (CYMBALTA) 30 MG capsule TAKE ONE CAPSULE BY MOUTH DAILY (Patient taking differently: Take one capsule by mouth every other day) 30 capsule 0  . fluticasone (FLONASE) 50 MCG/ACT nasal spray Place 1 spray into both nostrils as needed.     Marland Kitchen levocetirizine (XYZAL) 5 MG tablet Take 5 mg by mouth as needed.   3  . pravastatin (PRAVACHOL) 80 MG tablet TAKE ONE-HALF (1/2) TABLET BY MOUTH DAILY 45 tablet 1  . Turmeric 500 MG CAPS Take by mouth.     No current facility-administered medications on file prior to visit.     PAST MEDICAL HISTORY: Past Medical History:  Diagnosis Date  . Abnormal liver function tests 07/19/2015  . Allergic state 02/01/2010   Qualifier: Diagnosis of  By: Regis Bill MD, Standley Brooking   . Allergy   . Anxiety   . Back pain   . Chicken pox 72 yrs old  . Depressive disorder, not elsewhere classified   . Diabetes ([place])    diet controlled- no medicines   . Fibroid   . History of fractured kneecap    right   . Hyperglycemia   . Hyperlipidemia   . Infertility, female   . Joint pain   . Knee pain, left 10/05/2011   Follows with Dr Margarette Canada  . Measles as a child  . Medicare annual wellness visit, subsequent 07/06/2014  . OA (osteoarthritis) of knee    left knee  .  Obesity 10/05/2011  . Obesity 10/05/2011  . Osteoarthritis   . Osteopenia 06/26/2014  . Other and unspecified hyperlipidemia   . Overweight 10/05/2011  . Overweight(278.02) 10/05/2011  . Sleep apnea     On CPAP  . Sleep apnea   . Stye 11/19/2012  . Syncope 01/26/2017  . Thyroid disease    h/o hyperthyroid treated with radioactive iodine? from 16 to 20  . Type 2 diabetes mellitus with hyperlipidemia ([place])   . Varicose vein of leg     PAST SURGICAL HISTORY: Past Surgical History:  Procedure Laterality Date  . BACK SURGERY     L5 discectomy, 2002. helpful  . CERVICAL BIOPSY  W/ LOOP ELECTRODE EXCISION    . CESAREAN SECTION  1987  . COLONOSCOPY    . CPAP    . KNEE SURGERY Right 2006   R  knee fx. patella  . LEEP    . stitches in foot Right     SOCIAL HISTORY: Social History   Tobacco Use  . Smoking status: Never Smoker  . Smokeless tobacco: Never Used  Substance Use Topics  . Alcohol use: Yes    Alcohol/week: 0.6 oz    Types: 1 Glasses of wine per week  . Drug use: No    FAMILY HISTORY: Family History  Problem Relation Age of Onset  . Hyperlipidemia Mother   . Dementia Mother   . Obesity Mother   . Hyperlipidemia Father   . Heart disease Father        bypass, CAD  . Sleep apnea Father   . Hyperlipidemia Sister   . Diabetes Sister        type 2  . Colon polyps Sister   . Dementia Maternal Grandmother        alzheimer  . Alcohol abuse Maternal Grandfather   . Diabetes Paternal Grandmother        type 2?  . Colon cancer Neg Hx   . Esophageal cancer Neg Hx   . Rectal cancer Neg Hx   . Stomach cancer Neg Hx     ROS: Review of Systems  Constitutional: Negative for weight loss.  Gastrointestinal: Negative for nausea.  Genitourinary: Negative for frequency.  Endo/Heme/Allergies: Negative for polydipsia.       Positive polyphagia Negative hypoglycemia    PHYSICAL EXAM: Blood pressure 104/65, pulse 70, temperature 97.8 F (36.6 C), temperature source Oral, height 5\' 5"  (1.651 m), weight 170 lb (77.1 kg), last menstrual period 04/04/2000, SpO2 97 %. Body mass index is 28.29 kg/m. Physical Exam  Constitutional: Cindy [name] is oriented to person, place, and time. Cindy [name] appears well-developed and well-nourished.  Cardiovascular: Normal rate.  Pulmonary/Chest: Effort normal.  Musculoskeletal: Normal range of motion.  Neurological: Cindy [name] is oriented to person, place, and time.  Skin: Skin is warm and dry.  Psychiatric: Cindy [name] has a normal mood and affect. Cindy [name] behavior is normal.  Vitals reviewed.   RECENT LABS AND TESTS: BMET    Component Value Date/Time   NA 138 01/10/2017 0938   K 4.9 01/10/2017 0938   CL 101 01/10/2017 0938   CO2 24 01/10/2017 0938    GLUCOSE 101 (H) 01/10/2017 0938   GLUCOSE 112 (H) 08/05/2016 0956   BUN 24 01/10/2017 0938   CREATININE 0.72 01/10/2017 0938   CALCIUM 9.9 01/10/2017 0938   GFRNONAA 86 01/10/2017 0938   GFRAA 99 01/10/2017 0938   Lab Results  Component Value Date   HGBA1C 5.8 (H) 01/10/2017   HGBA1C 6.1 08/05/2016  HGBA1C 5.9 07/06/2015   HGBA1C 5.6 01/02/2015   HGBA1C 6.2 07/04/2014   Lab Results  Component Value Date   INSULIN 11.7 01/10/2017   CBC    Component Value Date/Time   WBC 6.2 01/10/2017 0938   WBC 7.4 08/05/2016 0956   RBC 4.51 01/10/2017 0938   RBC 4.56 08/05/2016 0956   HGB 13.2 01/10/2017 0938   HCT 41.3 01/10/2017 0938   PLT 308.0 08/05/2016 0956   MCV 92 01/10/2017 0938   MCH 29.3 01/10/2017 0938   MCHC 32.0 01/10/2017 0938   MCHC 33.6 08/05/2016 0956   RDW 14.0 01/10/2017 0938   LYMPHSABS 2.1 01/10/2017 0938   MONOABS 0.7 08/05/2016 0956   EOSABS 0.2 01/10/2017 0938   BASOSABS 0.1 01/10/2017 0938   Iron/TIBC/Ferritin/ %Sat No results found for: IRON, TIBC, FERRITIN, IRONPCTSAT Lipid Panel     Component Value Date/Time   CHOL 241 (H) 01/10/2017 0938   TRIG 81 01/10/2017 0938   TRIG 94 04/07/2006 1019   HDL 60 01/10/2017 0938   CHOLHDL 4 08/05/2016 0956   VLDL 20.0 08/05/2016 0956   LDLCALC 165 (H) 01/10/2017 0938   LDLDIRECT 145.4 11/14/2012 0823   Hepatic Function Panel     Component Value Date/Time   PROT 6.8 01/10/2017 0938   ALBUMIN 4.2 01/10/2017 0938   AST 17 01/10/2017 0938   ALT 25 01/10/2017 0938   ALKPHOS 74 01/10/2017 0938   BILITOT 0.4 01/10/2017 0938   BILIDIR 0.1 10/29/2013 0831      Component Value Date/Time   TSH 1.020 01/10/2017 0938   TSH 1.21 08/05/2016 0956   TSH 1.56 07/06/2015 0936    ASSESSMENT AND PLAN: Prediabetes - Plan: metFORMIN (GLUCOPHAGE) 500 MG tablet  At risk for diabetes mellitus  Class 2 severe obesity with serious comorbidity and body mass index (BMI) of 39.0 to 39.9 in adult, unspecified obesity type  ([place])  PLAN:  Pre-Diabetes Cindy [name] will continue to work on diet, weight loss, exercise, and decreasing simple carbohydrates in Cindy [name] diet to help decrease the risk of diabetes. We dicussed metformin including benefits and risks. Cindy [name] was informed that eating too many simple carbohydrates or too many calories at one sitting increases the likelihood of GI side effects. Cindy [name] agrees to continue metformin 500 mg q AM #30 and we will refill for 1 month. Cindy [name] agrees to follow up with our clinic in 2 weeks as directed to monitor Cindy [name] progress.  Diabetes risk counselling Cindy [name] was given extended (15 minutes) diabetes prevention counseling today. Cindy [name] is 72 y.o. female and has risk factors for diabetes including obesity and pre-diabetes. We discussed intensive lifestyle modifications today with an emphasis on weight loss as well as increasing exercise and decreasing simple carbohydrates in Cindy [name] diet.  Obesity Cindy [name] is currently in the action stage of change. As such, Cindy [name] goal is to continue with weight loss efforts Cindy [name] has agreed to follow the Pescartarian eating plan Cindy [name] has been instructed to work up to a goal of 150 minutes of combined cardio and strengthening exercise per week for weight loss and overall health benefits. We discussed the following Behavioral Modification Strategies today: increasing lean protein intake, no skipping meals, and keeping healthy foods in the home   Cindy [name] has agreed to follow up with our clinic in 2 weeks. Cindy [name] was informed of the importance of frequent follow up visits to maximize Cindy [name] success with intensive lifestyle modifications for Cindy [name] multiple health conditions.   OBESITY BEHAVIORAL INTERVENTION VISIT  Today's visit was #  6 out of 22.  Starting weight: 185 lbs Starting date: 01/10/17 Today's weight : 170 lbs  Today's date: 04/10/2017 Total lbs lost to date: 15 (Patients must lose 7 lbs in the first 6 months to continue with counseling)   ASK: We discussed the  diagnosis of obesity with Cindy [name] today and Cindy [name] agreed to give [name] permission to discuss obesity behavioral modification therapy today.  ASSESS: Shandell has the diagnosis of obesity and Cindy [name] BMI today is 28.29 Nastacia is in the action stage of change   ADVISE: Maleah was educated on the multiple health risks of obesity as well as the benefit of weight loss to improve Cindy [name] health. Cindy [name] was advised of the need for long term treatment and the importance of lifestyle modifications.  AGREE: Multiple dietary modification options and treatment options were discussed and  Nargis agreed to the above obesity treatment plan.  I, Trixie Dredge, am acting as transcriptionist for Dennard Nip, MD  I have reviewed the above documentation for accuracy and completeness, and I agree with the above. -Dennard Nip, MD

## 2017-04-16 ENCOUNTER — Other Ambulatory Visit: Payer: Self-pay | Admitting: Family Medicine

## 2017-04-16 ENCOUNTER — Other Ambulatory Visit (INDEPENDENT_AMBULATORY_CARE_PROVIDER_SITE_OTHER): Payer: Self-pay | Admitting: Family Medicine

## 2017-04-16 DIAGNOSIS — R7303 Prediabetes: Secondary | ICD-10-CM

## 2017-04-20 ENCOUNTER — Ambulatory Visit (INDEPENDENT_AMBULATORY_CARE_PROVIDER_SITE_OTHER): Payer: Medicare HMO | Admitting: Physician Assistant

## 2017-04-20 VITALS — BP 118/67 | HR 68 | Temp 98.2°F | Ht 65.0 in | Wt 170.0 lb

## 2017-04-20 DIAGNOSIS — R7303 Prediabetes: Secondary | ICD-10-CM | POA: Diagnosis not present

## 2017-04-20 DIAGNOSIS — E669 Obesity, unspecified: Secondary | ICD-10-CM | POA: Diagnosis not present

## 2017-04-20 DIAGNOSIS — Z6830 Body mass index (BMI) 30.0-30.9, adult: Secondary | ICD-10-CM

## 2017-04-20 NOTE — Progress Notes (Addendum)
Office: [phone]  /  Fax: [phone]   HPI:   Chief Complaint: OBESITY Cindy [name] is here to discuss her progress with her obesity treatment plan. She is on the Pescatarian eating plan and is following her eating plan approximately 90 % of the time. She states she is doing Curves and Zumba 45 minutes 2 times per week. Cindy [name] maintained her weight. She continues to be mindful of her eating and states her hunger is well controlled. She would like more meal planning ideas. Her weight is 170 lb (77.1 kg) today and Cindy [name] maintained weight over a period of 2 weeks since her last visit. She Cindy [name] lost 15 lbs since starting treatment with [name].  Pre-Diabetes Cindy [name] Cindy [name] a diagnosis of prediabetes based on her elevated Hgb A1c and was informed this puts her at greater risk of developing diabetes. She is not taking metformin currently and continues to work on diet and exercise to decrease risk of diabetes. She denies nausea, polyphagia or hypoglycemia.  ALLERGIES: Allergies  Allergen Reactions  . Codeine Other (See Comments)    hallucinations  . Crestor [Rosuvastatin]     REACTION: myalgias  . Other Other (See Comments)    NOVACAINE- blisters left side of mouth after receiving   . Amoxicillin Rash  . Erythromycin Other (See Comments)    cramps  . Sulfa Antibiotics Rash  . Sulfonamide Derivatives Rash    MEDICATIONS: Current Outpatient Medications on File Prior to Visit  Medication Sig Dispense Refill  . aspirin 81 MG tablet Take 81 mg by mouth daily.    . cholecalciferol (VITAMIN D) 1000 units tablet Take 2,000 Units by mouth daily.     . cycloSPORINE (RESTASIS) 0.05 % ophthalmic emulsion Place 1 drop into both eyes daily as needed. Reported on 07/09/2015    . DULoxetine (CYMBALTA) 30 MG capsule TAKE ONE CAPSULE BY MOUTH DAILY (Patient taking differently: TAKE ONE CAPSULE BY MOUTH EVERY OTHER DAY) 30 capsule 0  . fluticasone (FLONASE) 50 MCG/ACT nasal spray Place 1 spray into both nostrils as  needed.     Marland Kitchen levocetirizine (XYZAL) 5 MG tablet Take 5 mg by mouth as needed.   3  . metFORMIN (GLUCOPHAGE) 500 MG tablet Take 1 tablet (500 mg total) by mouth daily with breakfast. 30 tablet 0  . pravastatin (PRAVACHOL) 80 MG tablet TAKE ONE-HALF (1/2) TABLET BY MOUTH DAILY 45 tablet 1  . Turmeric 500 MG CAPS Take by mouth.     No current facility-administered medications on file prior to visit.     PAST MEDICAL HISTORY: Past Medical History:  Diagnosis Date  . Abnormal liver function tests 07/19/2015  . Allergic state 02/01/2010   Qualifier: Diagnosis of  By: Regis Bill MD, Standley Brooking   . Allergy   . Anxiety   . Back pain   . Chicken pox 72 yrs old  . Depressive disorder, not elsewhere classified   . Diabetes ([place])    diet controlled- no medicines   . Fibroid   . History of fractured kneecap    right   . Hyperglycemia   . Hyperlipidemia   . Infertility, female   . Joint pain   . Knee pain, left 10/05/2011   Follows with Dr Margarette Canada  . Measles as a child  . Medicare annual wellness visit, subsequent 07/06/2014  . OA (osteoarthritis) of knee    left knee  . Obesity 10/05/2011  . Obesity 10/05/2011  . Osteoarthritis   . Osteopenia 06/26/2014  . Other and unspecified  hyperlipidemia   . Overweight 10/05/2011  . Overweight(278.02) 10/05/2011  . Sleep apnea     On CPAP  . Sleep apnea   . Stye 11/19/2012  . Syncope 01/26/2017  . Thyroid disease    h/o hyperthyroid treated with radioactive iodine? from 16 to 20  . Type 2 diabetes mellitus with hyperlipidemia ([place])   . Varicose vein of leg     PAST SURGICAL HISTORY: Past Surgical History:  Procedure Laterality Date  . BACK SURGERY     L5 discectomy, 2002. helpful  . CERVICAL BIOPSY  W/ LOOP ELECTRODE EXCISION    . CESAREAN SECTION  1987  . COLONOSCOPY    . CPAP    . KNEE SURGERY Right 2006   R knee fx. patella  . LEEP    . stitches in foot Right     SOCIAL HISTORY: Social History   Tobacco Use  . Smoking status: Never Smoker   . Smokeless tobacco: Never Used  Substance Use Topics  . Alcohol use: Yes    Alcohol/week: 0.6 oz    Types: 1 Glasses of wine per week  . Drug use: No    FAMILY HISTORY: Family History  Problem Relation Age of Onset  . Hyperlipidemia Mother   . Dementia Mother   . Obesity Mother   . Hyperlipidemia Father   . Heart disease Father        bypass, CAD  . Sleep apnea Father   . Hyperlipidemia Sister   . Diabetes Sister        type 2  . Colon polyps Sister   . Dementia Maternal Grandmother        alzheimer  . Alcohol abuse Maternal Grandfather   . Diabetes Paternal Grandmother        type 2?  . Colon cancer Neg Hx   . Esophageal cancer Neg Hx   . Rectal cancer Neg Hx   . Stomach cancer Neg Hx     ROS: Review of Systems  Constitutional: Negative for weight loss.  Gastrointestinal: Negative for nausea.  Endo/Heme/Allergies:       Negative polyphagia Negative hypoglycemia    PHYSICAL EXAM: Blood pressure 118/67, pulse 68, temperature 98.2 F (36.8 C), temperature source Oral, height 5\' 5"  (1.651 m), weight 170 lb (77.1 kg), last menstrual period 04/04/2000, SpO2 98 %. Body mass index is 28.29 kg/m. Physical Exam  Constitutional: She is oriented to person, place, and time. She appears well-developed and well-nourished.  Cardiovascular: Normal rate.  Pulmonary/Chest: Effort normal.  Musculoskeletal: Normal range of motion.  Neurological: She is oriented to person, place, and time.  Skin: Skin is warm and dry.  Psychiatric: She Cindy [name] a normal mood and affect. Her behavior is normal.  Vitals reviewed.   RECENT LABS AND TESTS: BMET    Component Value Date/Time   NA 138 01/10/2017 0938   K 4.9 01/10/2017 0938   CL 101 01/10/2017 0938   CO2 24 01/10/2017 0938   GLUCOSE 101 (H) 01/10/2017 0938   GLUCOSE 112 (H) 08/05/2016 0956   BUN 24 01/10/2017 0938   CREATININE 0.72 01/10/2017 0938   CALCIUM 9.9 01/10/2017 0938   GFRNONAA 86 01/10/2017 0938   GFRAA 99  01/10/2017 0938   Lab Results  Component Value Date   HGBA1C 5.8 (H) 01/10/2017   HGBA1C 6.1 08/05/2016   HGBA1C 5.9 07/06/2015   HGBA1C 5.6 01/02/2015   HGBA1C 6.2 07/04/2014   Lab Results  Component Value Date   INSULIN 11.7 01/10/2017  CBC    Component Value Date/Time   WBC 6.2 01/10/2017 0938   WBC 7.4 08/05/2016 0956   RBC 4.51 01/10/2017 0938   RBC 4.56 08/05/2016 0956   HGB 13.2 01/10/2017 0938   HCT 41.3 01/10/2017 0938   PLT 308.0 08/05/2016 0956   MCV 92 01/10/2017 0938   MCH 29.3 01/10/2017 0938   MCHC 32.0 01/10/2017 0938   MCHC 33.6 08/05/2016 0956   RDW 14.0 01/10/2017 0938   LYMPHSABS 2.1 01/10/2017 0938   MONOABS 0.7 08/05/2016 0956   EOSABS 0.2 01/10/2017 0938   BASOSABS 0.1 01/10/2017 0938   Iron/TIBC/Ferritin/ %Sat No results found for: IRON, TIBC, FERRITIN, IRONPCTSAT Lipid Panel     Component Value Date/Time   CHOL 241 (H) 01/10/2017 0938   TRIG 81 01/10/2017 0938   TRIG 94 04/07/2006 1019   HDL 60 01/10/2017 0938   CHOLHDL 4 08/05/2016 0956   VLDL 20.0 08/05/2016 0956   LDLCALC 165 (H) 01/10/2017 0938   LDLDIRECT 145.4 11/14/2012 0823   Hepatic Function Panel     Component Value Date/Time   PROT 6.8 01/10/2017 0938   ALBUMIN 4.2 01/10/2017 0938   AST 17 01/10/2017 0938   ALT 25 01/10/2017 0938   ALKPHOS 74 01/10/2017 0938   BILITOT 0.4 01/10/2017 0938   BILIDIR 0.1 10/29/2013 0831      Component Value Date/Time   TSH 1.020 01/10/2017 0938   TSH 1.21 08/05/2016 0956   TSH 1.56 07/06/2015 0936    ASSESSMENT AND PLAN: Prediabetes  Class 1 obesity with serious comorbidity and body mass index (BMI) of 30.0 to 30.9 in adult, unspecified obesity type - Starting BMI greater then 30  PLAN:  Pre-Diabetes Cindy [name] will continue to work on weight loss, exercise, and decreasing simple carbohydrates in her diet to help decrease the risk of diabetes. We dicussed metformin including benefits and risks and she declined metformin today. She  was informed that eating too many simple carbohydrates or too many calories at one sitting increases the likelihood of GI side effects. Cindy [name] agreed to follow up with [name] as directed to monitor her progress.  We spent > than 50% of the 15 minute visit on the counseling as documented in the note.  Obesity Cindy [name] is currently in the action stage of change. As such, her goal is to continue with weight loss efforts She Cindy [name] agreed to follow the Pescatarian eating plan Cindy [name] Cindy [name] been instructed to work up to a goal of 150 minutes of combined cardio and strengthening exercise per week for weight loss and overall health benefits. We discussed the following Behavioral Modification Strategies today: increasing lean protein intake and work on meal planning and easy cooking plans  Madysun Cindy [name] agreed to follow up with our clinic in 2 weeks. She was informed of the importance of frequent follow up visits to maximize her success with intensive lifestyle modifications for her multiple health conditions.    OBESITY BEHAVIORAL INTERVENTION VISIT  Today's visit was # 7 out of 22.  Starting weight: 185 lbs Starting date: 01/10/17 Today's weight : 170 lbs Today's date: 04/20/2017 Total lbs lost to date: 15 (Patients must lose 7 lbs in the first 6 months to continue with counseling)   ASK: We discussed the diagnosis of obesity with Cindy [name] today and Meighan agreed to give [name] permission to discuss obesity behavioral modification therapy today.  ASSESS: Kalila Cindy [name] the diagnosis of obesity and her BMI today is 28.29 Senia is in the action stage  of change   ADVISE: Wilena was educated on the multiple health risks of obesity as well as the benefit of weight loss to improve her health. She was advised of the need for long term treatment and the importance of lifestyle modifications.  AGREE: Multiple dietary modification options and treatment options were discussed and  Jamyia agreed to the above obesity  treatment plan.   Corey Skains, am acting as transcriptionist for [employer], PA-C I, Lacy Duverney [facility], have reviewed this note and agree with its contents.  I have reviewed the above documentation for accuracy and completeness, and I agree with the above. -Dennard Nip, MD

## 2017-04-23 DIAGNOSIS — G4733 Obstructive sleep apnea (adult) (pediatric): Secondary | ICD-10-CM | POA: Diagnosis not present

## 2017-05-04 ENCOUNTER — Ambulatory Visit (INDEPENDENT_AMBULATORY_CARE_PROVIDER_SITE_OTHER): Payer: Medicare HMO | Admitting: Physician Assistant

## 2017-05-04 VITALS — BP 128/85 | HR 62 | Temp 97.7°F | Ht 65.0 in | Wt 165.0 lb

## 2017-05-04 DIAGNOSIS — E669 Obesity, unspecified: Secondary | ICD-10-CM | POA: Diagnosis not present

## 2017-05-04 DIAGNOSIS — Z6830 Body mass index (BMI) 30.0-30.9, adult: Secondary | ICD-10-CM

## 2017-05-04 DIAGNOSIS — R7303 Prediabetes: Secondary | ICD-10-CM

## 2017-05-04 NOTE — Progress Notes (Signed)
Office: [phone]  /  Fax: [phone]   HPI:   Chief Complaint: OBESITY Cindy [name] is here to discuss her progress with her obesity treatment plan. She is on the Pescatarian eating plan and is following her eating plan approximately 99 % of the time. She states she is doing curves and zumba for 45 minutes 4 times per week. Cindy [name] continues to do well with weight loss. She states she would rather be on the structured meal plan.  Her weight is 165 lb (74.8 kg) today and has had a weight loss of 5 pounds over a period of 2 weeks since her last visit. She has lost 20 lbs since starting treatment with [name].  Pre-Diabetes Cindy [name] has a diagnosis of pre-diabetes based on her elevated Hgb A1c and was informed this puts her at greater risk of developing diabetes. She is taking metformin currently and continues to work on diet and exercise to decrease risk of diabetes. She denies polyphagia, nausea, or hypoglycemia.  ALLERGIES: Allergies  Allergen Reactions  . Codeine Other (See Comments)    hallucinations  . Crestor [Rosuvastatin]     REACTION: myalgias  . Other Other (See Comments)    NOVACAINE- blisters left side of mouth after receiving   . Amoxicillin Rash  . Erythromycin Other (See Comments)    cramps  . Sulfa Antibiotics Rash  . Sulfonamide Derivatives Rash    MEDICATIONS: Current Outpatient Medications on File Prior to Visit  Medication Sig Dispense Refill  . aspirin 81 MG tablet Take 81 mg by mouth daily.    . cholecalciferol (VITAMIN D) 1000 units tablet Take 2,000 Units by mouth daily.     . cycloSPORINE (RESTASIS) 0.05 % ophthalmic emulsion Place 1 drop into both eyes daily as needed. Reported on 07/09/2015    . DULoxetine (CYMBALTA) 30 MG capsule TAKE ONE CAPSULE BY MOUTH DAILY (Patient taking differently: TAKE ONE CAPSULE BY MOUTH EVERY OTHER DAY) 30 capsule 0  . fluticasone (FLONASE) 50 MCG/ACT nasal spray Place 1 spray into both nostrils as needed.     Marland Kitchen levocetirizine  (XYZAL) 5 MG tablet Take 5 mg by mouth as needed.   3  . metFORMIN (GLUCOPHAGE) 500 MG tablet Take 1 tablet (500 mg total) by mouth daily with breakfast. 30 tablet 0  . pravastatin (PRAVACHOL) 80 MG tablet TAKE ONE-HALF (1/2) TABLET BY MOUTH DAILY 45 tablet 1  . Turmeric 500 MG CAPS Take by mouth.     No current facility-administered medications on file prior to visit.     PAST MEDICAL HISTORY: Past Medical History:  Diagnosis Date  . Abnormal liver function tests 07/19/2015  . Allergic state 02/01/2010   Qualifier: Diagnosis of  By: Regis Bill MD, Standley Brooking   . Allergy   . Anxiety   . Back pain   . Chicken pox 72 yrs old  . Depressive disorder, not elsewhere classified   . Diabetes ([place])    diet controlled- no medicines   . Fibroid   . History of fractured kneecap    right   . Hyperglycemia   . Hyperlipidemia   . Infertility, female   . Joint pain   . Knee pain, left 10/05/2011   Follows with Dr Margarette Canada  . Measles as a child  . Medicare annual wellness visit, subsequent 07/06/2014  . OA (osteoarthritis) of knee    left knee  . Obesity 10/05/2011  . Obesity 10/05/2011  . Osteoarthritis   . Osteopenia 06/26/2014  . Other and unspecified hyperlipidemia   .  Overweight 10/05/2011  . Overweight(278.02) 10/05/2011  . Sleep apnea     On CPAP  . Sleep apnea   . Stye 11/19/2012  . Syncope 01/26/2017  . Thyroid disease    h/o hyperthyroid treated with radioactive iodine? from 16 to 20  . Type 2 diabetes mellitus with hyperlipidemia ([place])   . Varicose vein of leg     PAST SURGICAL HISTORY: Past Surgical History:  Procedure Laterality Date  . BACK SURGERY     L5 discectomy, 2002. helpful  . CERVICAL BIOPSY  W/ LOOP ELECTRODE EXCISION    . CESAREAN SECTION  1987  . COLONOSCOPY    . CPAP    . KNEE SURGERY Right 2006   R knee fx. patella  . LEEP    . stitches in foot Right     SOCIAL HISTORY: Social History   Tobacco Use  . Smoking status: Never Smoker  . Smokeless tobacco: Never  Used  Substance Use Topics  . Alcohol use: Yes    Alcohol/week: 0.6 oz    Types: 1 Glasses of wine per week  . Drug use: No    FAMILY HISTORY: Family History  Problem Relation Age of Onset  . Hyperlipidemia Mother   . Dementia Mother   . Obesity Mother   . Hyperlipidemia Father   . Heart disease Father        bypass, CAD  . Sleep apnea Father   . Hyperlipidemia Sister   . Diabetes Sister        type 2  . Colon polyps Sister   . Dementia Maternal Grandmother        alzheimer  . Alcohol abuse Maternal Grandfather   . Diabetes Paternal Grandmother        type 2?  . Colon cancer Neg Hx   . Esophageal cancer Neg Hx   . Rectal cancer Neg Hx   . Stomach cancer Neg Hx     ROS: Review of Systems  Constitutional: Positive for weight loss.  Gastrointestinal: Negative for nausea.  Endo/Heme/Allergies:       Negative polyphagia Negative hypoglycemia    PHYSICAL EXAM: Blood pressure 128/85, pulse 62, temperature 97.7 F (36.5 C), temperature source Oral, height 5\' 5"  (1.651 m), weight 165 lb (74.8 kg), last menstrual period 04/04/2000, SpO2 90 %. Body mass index is 27.46 kg/m. Physical Exam  Constitutional: She is oriented to person, place, and time. She appears well-developed and well-nourished.  Cardiovascular: Normal rate.  Pulmonary/Chest: Effort normal.  Musculoskeletal: Normal range of motion.  Neurological: She is oriented to person, place, and time.  Skin: Skin is warm and dry.  Psychiatric: She has a normal mood and affect. Her behavior is normal.  Vitals reviewed.   RECENT LABS AND TESTS: BMET    Component Value Date/Time   NA 138 01/10/2017 0938   K 4.9 01/10/2017 0938   CL 101 01/10/2017 0938   CO2 24 01/10/2017 0938   GLUCOSE 101 (H) 01/10/2017 0938   GLUCOSE 112 (H) 08/05/2016 0956   BUN 24 01/10/2017 0938   CREATININE 0.72 01/10/2017 0938   CALCIUM 9.9 01/10/2017 0938   GFRNONAA 86 01/10/2017 0938   GFRAA 99 01/10/2017 0938   Lab Results    Component Value Date   HGBA1C 5.8 (H) 01/10/2017   HGBA1C 6.1 08/05/2016   HGBA1C 5.9 07/06/2015   HGBA1C 5.6 01/02/2015   HGBA1C 6.2 07/04/2014   Lab Results  Component Value Date   INSULIN 11.7 01/10/2017   CBC  Component Value Date/Time   WBC 6.2 01/10/2017 0938   WBC 7.4 08/05/2016 0956   RBC 4.51 01/10/2017 0938   RBC 4.56 08/05/2016 0956   HGB 13.2 01/10/2017 0938   HCT 41.3 01/10/2017 0938   PLT 308.0 08/05/2016 0956   MCV 92 01/10/2017 0938   MCH 29.3 01/10/2017 0938   MCHC 32.0 01/10/2017 0938   MCHC 33.6 08/05/2016 0956   RDW 14.0 01/10/2017 0938   LYMPHSABS 2.1 01/10/2017 0938   MONOABS 0.7 08/05/2016 0956   EOSABS 0.2 01/10/2017 0938   BASOSABS 0.1 01/10/2017 0938   Iron/TIBC/Ferritin/ %Sat No results found for: IRON, TIBC, FERRITIN, IRONPCTSAT Lipid Panel     Component Value Date/Time   CHOL 241 (H) 01/10/2017 0938   TRIG 81 01/10/2017 0938   TRIG 94 04/07/2006 1019   HDL 60 01/10/2017 0938   CHOLHDL 4 08/05/2016 0956   VLDL 20.0 08/05/2016 0956   LDLCALC 165 (H) 01/10/2017 0938   LDLDIRECT 145.4 11/14/2012 0823   Hepatic Function Panel     Component Value Date/Time   PROT 6.8 01/10/2017 0938   ALBUMIN 4.2 01/10/2017 0938   AST 17 01/10/2017 0938   ALT 25 01/10/2017 0938   ALKPHOS 74 01/10/2017 0938   BILITOT 0.4 01/10/2017 0938   BILIDIR 0.1 10/29/2013 0831      Component Value Date/Time   TSH 1.020 01/10/2017 0938   TSH 1.21 08/05/2016 0956   TSH 1.56 07/06/2015 0936    ASSESSMENT AND PLAN: Prediabetes  Class 1 obesity with serious comorbidity and body mass index (BMI) of 30.0 to 30.9 in adult, unspecified obesity type - Starting BMI greater then 30  PLAN:  Pre-Diabetes Lashya will continue to work on weight loss, exercise, and decreasing simple carbohydrates in her diet to help decrease the risk of diabetes. We dicussed metformin including benefits and risks. She was informed that eating too many simple carbohydrates or too  many calories at one sitting increases the likelihood of GI side effects. Cindy [name] agrees to continue taking metformin as prescribed. Cindy [name] agrees to follow up with our clinic in 2 weeks as directed to monitor her progress.  We spent > than 50% of the 15 minute visit on the counseling as documented in the note.  Obesity Cindy [name] is currently in the action stage of change. As such, her goal is to continue with weight loss efforts She has agreed to follow the Pescatarian eating plan Cindy [name] has been instructed to work up to a goal of 150 minutes of combined cardio and strengthening exercise per week for weight loss and overall health benefits. We discussed the following Behavioral Modification Strategies today: increasing lean protein intake and work on meal planning and easy cooking plans   Cindy [name] has agreed to follow up with our clinic in 2 weeks. She was informed of the importance of frequent follow up visits to maximize her success with intensive lifestyle modifications for her multiple health conditions.   OBESITY BEHAVIORAL INTERVENTION VISIT  Today's visit was # 8 out of 22.  Starting weight: 185 lbs Starting date: 01/10/17 Today's weight : 165 lbs  Today's date: 05/04/2017 Total lbs lost to date: 20 (Patients must lose 7 lbs in the first 6 months to continue with counseling)   ASK: We discussed the diagnosis of obesity with Cindy [name] today and Cindy [name] agreed to give [name] permission to discuss obesity behavioral modification therapy today.  ASSESS: Kaileigh has the diagnosis of obesity and her BMI today is 27.46 Yu is  in the action stage of change   ADVISE: Keyleen was educated on the multiple health risks of obesity as well as the benefit of weight loss to improve her health. She was advised of the need for long term treatment and the importance of lifestyle modifications.  AGREE: Multiple dietary modification options and treatment options were discussed and  Norita agreed to the above  obesity treatment plan.   Wilhemena Durie, am acting as transcriptionist for Cindy Duverney, PA-C I, Cindy [name] [facility], have reviewed this note and agree with its content.

## 2017-05-18 ENCOUNTER — Ambulatory Visit (INDEPENDENT_AMBULATORY_CARE_PROVIDER_SITE_OTHER): Payer: Medicare HMO | Admitting: Physician Assistant

## 2017-05-18 VITALS — BP 99/61 | HR 59 | Temp 98.0°F | Ht 65.0 in | Wt 163.0 lb

## 2017-05-18 DIAGNOSIS — Z6830 Body mass index (BMI) 30.0-30.9, adult: Secondary | ICD-10-CM | POA: Diagnosis not present

## 2017-05-18 DIAGNOSIS — E7849 Other hyperlipidemia: Secondary | ICD-10-CM | POA: Diagnosis not present

## 2017-05-18 DIAGNOSIS — R7303 Prediabetes: Secondary | ICD-10-CM

## 2017-05-18 DIAGNOSIS — E669 Obesity, unspecified: Secondary | ICD-10-CM

## 2017-05-18 MED ORDER — METFORMIN HCL 500 MG PO TABS: 500.0000 mg | ORAL_TABLET | Freq: Every day | ORAL | 0 refills | Status: DC

## 2017-05-18 NOTE — Progress Notes (Addendum)
Office: [phone]  /  Fax: [phone]   HPI:   Chief Complaint: OBESITY Cindy [name] is here to discuss her progress with her obesity treatment plan. She is on the Pescatarian plan and is following her eating plan approximately 90 % of the time. She states she is doing zumba and curves for 90 minutes 3 times per week. Cindy [name] continues to do well with weight loss. She will be traveling out of town, and would like eating strategies.  Her weight is 163 lb (73.9 kg) today and has had a weight loss of 2 pounds over a period of 2 weeks since her last visit. She has lost 22 lbs since starting treatment with [name].  Pre-Diabetes Cindy [name] has a diagnosis of prediabetes based on her elevated HgA1c and was informed this puts her at greater risk of developing diabetes. She is taking metformin currently and continues to work on diet and exercise to decrease risk of diabetes. She denies nausea or hypoglycemia.  Hyperlipidemia Cindy [name] has hyperlipidemia and has been trying to improve her cholesterol levels with intensive lifestyle modification including a low saturated fat diet, exercise and weight loss. She is currently taking pravachol. She denies any chest pain, claudication or myalgias.  ALLERGIES: Allergies  Allergen Reactions  . Codeine Other (See Comments)    hallucinations  . Crestor [Rosuvastatin]     REACTION: myalgias  . Other Other (See Comments)    NOVACAINE- blisters left side of mouth after receiving   . Amoxicillin Rash  . Erythromycin Other (See Comments)    cramps  . Sulfa Antibiotics Rash  . Sulfonamide Derivatives Rash    MEDICATIONS: Current Outpatient Medications on File Prior to Visit  Medication Sig Dispense Refill  . aspirin 81 MG tablet Take 81 mg by mouth daily.    . cholecalciferol (VITAMIN D) 1000 units tablet Take 2,000 Units by mouth daily.     . cycloSPORINE (RESTASIS) 0.05 % ophthalmic emulsion Place 1 drop into both eyes daily as needed. Reported on 07/09/2015    .  DULoxetine (CYMBALTA) 30 MG capsule TAKE ONE CAPSULE BY MOUTH DAILY (Patient taking differently: TAKE ONE CAPSULE BY MOUTH EVERY OTHER DAY) 30 capsule 0  . fluticasone (FLONASE) 50 MCG/ACT nasal spray Place 1 spray into both nostrils as needed.     Marland Kitchen levocetirizine (XYZAL) 5 MG tablet Take 5 mg by mouth as needed.   3  . metFORMIN (GLUCOPHAGE) 500 MG tablet Take 1 tablet (500 mg total) by mouth daily with breakfast. 30 tablet 0  . pravastatin (PRAVACHOL) 80 MG tablet TAKE ONE-HALF (1/2) TABLET BY MOUTH DAILY 45 tablet 1  . Turmeric 500 MG CAPS Take by mouth.     No current facility-administered medications on file prior to visit.     PAST MEDICAL HISTORY: Past Medical History:  Diagnosis Date  . Abnormal liver function tests 07/19/2015  . Allergic state 02/01/2010   Qualifier: Diagnosis of  By: Regis Bill MD, Standley Brooking   . Allergy   . Anxiety   . Back pain   . Chicken pox 73 yrs old  . Depressive disorder, not elsewhere classified   . Diabetes ([place])    diet controlled- no medicines   . Fibroid   . History of fractured kneecap    right   . Hyperglycemia   . Hyperlipidemia   . Infertility, female   . Joint pain   . Knee pain, left 10/05/2011   Follows with Dr Margarette Canada  . Measles as a child  .  Medicare annual wellness visit, subsequent 07/06/2014  . OA (osteoarthritis) of knee    left knee  . Obesity 10/05/2011  . Obesity 10/05/2011  . Osteoarthritis   . Osteopenia 06/26/2014  . Other and unspecified hyperlipidemia   . Overweight 10/05/2011  . Overweight(278.02) 10/05/2011  . Sleep apnea     On CPAP  . Sleep apnea   . Stye 11/19/2012  . Syncope 01/26/2017  . Thyroid disease    h/o hyperthyroid treated with radioactive iodine? from 16 to 20  . Type 2 diabetes mellitus with hyperlipidemia ([place])   . Varicose vein of leg     PAST SURGICAL HISTORY: Past Surgical History:  Procedure Laterality Date  . BACK SURGERY     L5 discectomy, 2002. helpful  . CERVICAL BIOPSY  W/ LOOP ELECTRODE  EXCISION    . CESAREAN SECTION  1987  . COLONOSCOPY    . CPAP    . KNEE SURGERY Right 2006   R knee fx. patella  . LEEP    . stitches in foot Right     SOCIAL HISTORY: Social History   Tobacco Use  . Smoking status: Never Smoker  . Smokeless tobacco: Never Used  Substance Use Topics  . Alcohol use: Yes    Alcohol/week: 0.6 oz    Types: 1 Glasses of wine per week  . Drug use: No    FAMILY HISTORY: Family History  Problem Relation Age of Onset  . Hyperlipidemia Mother   . Dementia Mother   . Obesity Mother   . Hyperlipidemia Father   . Heart disease Father        bypass, CAD  . Sleep apnea Father   . Hyperlipidemia Sister   . Diabetes Sister        type 2  . Colon polyps Sister   . Dementia Maternal Grandmother        alzheimer  . Alcohol abuse Maternal Grandfather   . Diabetes Paternal Grandmother        type 2?  . Colon cancer Neg Hx   . Esophageal cancer Neg Hx   . Rectal cancer Neg Hx   . Stomach cancer Neg Hx     ROS: Review of Systems  Constitutional: Positive for weight loss. Negative for malaise/fatigue.  Cardiovascular: Negative for chest pain and claudication.  Gastrointestinal: Negative for nausea.  Endo/Heme/Allergies:       Negative for polyphagia  Negative for hypoglycemia     PHYSICAL EXAM: Blood pressure 99/61, pulse (!) 59, temperature 98 F (36.7 C), temperature source Oral, height 5\' 5"  (1.651 m), weight 163 lb (73.9 kg), last menstrual period 04/04/2000, SpO2 99 %. Body mass index is 27.12 kg/m. Physical Exam  Constitutional: She is oriented to person, place, and time. She appears well-developed and well-nourished.  HENT:  Head: Normocephalic.  Eyes: EOM are normal.  Neck: Normal range of motion.  Cardiovascular: Bradycardia present.  Pulmonary/Chest: Effort normal.  Musculoskeletal: Normal range of motion.  Neurological: She is alert and oriented to person, place, and time.  Skin: Skin is warm and dry.  Psychiatric: She has  a normal mood and affect. Her behavior is normal.  Vitals reviewed.   RECENT LABS AND TESTS: BMET    Component Value Date/Time   NA 138 01/10/2017 0938   K 4.9 01/10/2017 0938   CL 101 01/10/2017 0938   CO2 24 01/10/2017 0938   GLUCOSE 101 (H) 01/10/2017 0938   GLUCOSE 112 (H) 08/05/2016 0956   BUN 24 01/10/2017  6789   CREATININE 0.72 01/10/2017 0938   CALCIUM 9.9 01/10/2017 0938   GFRNONAA 86 01/10/2017 0938   GFRAA 99 01/10/2017 0938   Lab Results  Component Value Date   HGBA1C 5.8 (H) 01/10/2017   HGBA1C 6.1 08/05/2016   HGBA1C 5.9 07/06/2015   HGBA1C 5.6 01/02/2015   HGBA1C 6.2 07/04/2014   Lab Results  Component Value Date   INSULIN 11.7 01/10/2017   CBC    Component Value Date/Time   WBC 6.2 01/10/2017 0938   WBC 7.4 08/05/2016 0956   RBC 4.51 01/10/2017 0938   RBC 4.56 08/05/2016 0956   HGB 13.2 01/10/2017 0938   HCT 41.3 01/10/2017 0938   PLT 308.0 08/05/2016 0956   MCV 92 01/10/2017 0938   MCH 29.3 01/10/2017 0938   MCHC 32.0 01/10/2017 0938   MCHC 33.6 08/05/2016 0956   RDW 14.0 01/10/2017 0938   LYMPHSABS 2.1 01/10/2017 0938   MONOABS 0.7 08/05/2016 0956   EOSABS 0.2 01/10/2017 0938   BASOSABS 0.1 01/10/2017 0938   Iron/TIBC/Ferritin/ %Sat No results found for: IRON, TIBC, FERRITIN, IRONPCTSAT Lipid Panel     Component Value Date/Time   CHOL 241 (H) 01/10/2017 0938   TRIG 81 01/10/2017 0938   TRIG 94 04/07/2006 1019   HDL 60 01/10/2017 0938   CHOLHDL 4 08/05/2016 0956   VLDL 20.0 08/05/2016 0956   LDLCALC 165 (H) 01/10/2017 0938   LDLDIRECT 145.4 11/14/2012 0823   Hepatic Function Panel     Component Value Date/Time   PROT 6.8 01/10/2017 0938   ALBUMIN 4.2 01/10/2017 0938   AST 17 01/10/2017 0938   ALT 25 01/10/2017 0938   ALKPHOS 74 01/10/2017 0938   BILITOT 0.4 01/10/2017 0938   BILIDIR 0.1 10/29/2013 0831      Component Value Date/Time   TSH 1.020 01/10/2017 0938   TSH 1.21 08/05/2016 0956   TSH 1.56 07/06/2015 0936     ASSESSMENT AND PLAN: Prediabetes - Plan: metFORMIN (GLUCOPHAGE) 500 MG tablet  Other hyperlipidemia  Class 1 obesity with serious comorbidity and body mass index (BMI) of 30.0 to 30.9 in adult, unspecified obesity type - Starting BMI greater then 30  PLAN: Pre-Diabetes Cindy [name] will continue to work on weight loss, exercise, and decreasing simple carbohydrates in her diet to help decrease the risk of diabetes. We dicussed metformin including benefits and risks. She was informed that eating too many simple carbohydrates or too many calories at one sitting increases the likelihood of GI side effects. Cindy [name] agreed to continue metformin 500 mg #30 with no refills. Cindy [name] agreed to follow up with [name] as directed to monitor her progress.  Hyperlipidemia Cindy [name] was informed of the American Heart Association Guidelines emphasizing intensive lifestyle modifications as the first line treatment for hyperlipidemia. We discussed many lifestyle modifications today in depth, and Cindy [name] will continue to work on decreasing saturated fats such as fatty red meat, butter and many fried foods. She will also increase vegetables and lean protein in her diet and continue to work on exercise and weight loss efforts.   Obesity Cindy [name] is currently in the action stage of change. As such, her goal is to continue with weight loss efforts She has agreed to keep a food journal with [phone] calories and 85 grams of  protein  Cindy [name] has been instructed to work up to a goal of 150 minutes of combined cardio and strengthening exercise per week for weight loss and overall health benefits. We discussed the following Behavioral Modification Strategies  today: increasing lean protein intake and travel eating strategies.   Cindy [name] has agreed to follow up with our clinic in 3 weeks. She was informed of the importance of frequent follow up visits to maximize her success with intensive lifestyle modifications for her multiple health  conditions.    Today's visit was # 9 out of 22.  Starting weight: 185 lb Starting date: 01/10/17 Today's weight :163 lb Today's date: 05/18/2017 Total lbs lost to date: 22 (Patients must lose 7 lbs in the first 6 months to continue with counseling)   ASK: We discussed the diagnosis of obesity with Cindy [name] today and Cindy [name] agreed to give [name] permission to discuss obesity behavioral modification therapy today.  ASSESS: Cindy [name] has the diagnosis of obesity and her BMI today is 27.12 Cindy [name] is in the action stage of change   ADVISE: Cindy [name] was educated on the multiple health risks of obesity as well as the benefit of weight loss to improve her health. She was advised of the need for long term treatment and the importance of lifestyle modifications.  AGREE: Multiple dietary modification options and treatment options were discussed and  Cindy [name] agreed to the above obesity treatment plan.   Leary Roca, am acting as transcriptionist for [employer], PA-C I, Lacy Duverney [facility], have reviewed this note and agree with its content.

## 2017-05-20 ENCOUNTER — Other Ambulatory Visit (INDEPENDENT_AMBULATORY_CARE_PROVIDER_SITE_OTHER): Payer: Self-pay | Admitting: Family Medicine

## 2017-05-20 ENCOUNTER — Other Ambulatory Visit: Payer: Self-pay | Admitting: Family Medicine

## 2017-05-20 DIAGNOSIS — R7303 Prediabetes: Secondary | ICD-10-CM

## 2017-05-24 DIAGNOSIS — G4733 Obstructive sleep apnea (adult) (pediatric): Secondary | ICD-10-CM | POA: Diagnosis not present

## 2017-05-25 ENCOUNTER — Other Ambulatory Visit (INDEPENDENT_AMBULATORY_CARE_PROVIDER_SITE_OTHER): Payer: Self-pay | Admitting: Physician Assistant

## 2017-05-25 DIAGNOSIS — R7303 Prediabetes: Secondary | ICD-10-CM

## 2017-06-08 ENCOUNTER — Ambulatory Visit (INDEPENDENT_AMBULATORY_CARE_PROVIDER_SITE_OTHER): Payer: Medicare HMO | Admitting: Physician Assistant

## 2017-06-08 VITALS — BP 117/63 | HR 62 | Temp 98.1°F | Ht 65.0 in | Wt 163.0 lb

## 2017-06-08 DIAGNOSIS — Z6830 Body mass index (BMI) 30.0-30.9, adult: Secondary | ICD-10-CM | POA: Diagnosis not present

## 2017-06-08 DIAGNOSIS — R7303 Prediabetes: Secondary | ICD-10-CM | POA: Diagnosis not present

## 2017-06-08 DIAGNOSIS — Z9189 Other specified personal risk factors, not elsewhere classified: Secondary | ICD-10-CM

## 2017-06-08 DIAGNOSIS — E669 Obesity, unspecified: Secondary | ICD-10-CM

## 2017-06-08 DIAGNOSIS — E7849 Other hyperlipidemia: Secondary | ICD-10-CM

## 2017-06-08 MED ORDER — METFORMIN HCL 500 MG PO TABS: 500.0000 mg | ORAL_TABLET | Freq: Every day | ORAL | 0 refills | Status: DC

## 2017-06-08 NOTE — Progress Notes (Signed)
Office: [phone]  /  Fax: [phone]   HPI:   Chief Complaint: OBESITY Cindy [name] is here to discuss her progress with her obesity treatment plan. She is on the keep a food journal with [phone] calories and 85 grams of protein daily and is following her eating plan approximately 25 % of the time. She states she is doing zumba for 50 minutes 1-2 times per week. Cindy [name] managed to maintain her weight despite her recent travel and her eating out most of her meals. She is motivated to get back on track and continue weight loss. Her weight is 163 lb (73.9 kg) today and has not lost weight since her last visit. She has lost 22 lbs since starting treatment with [name].  Pre-Diabetes Cindy [name] has a diagnosis of pre-diabetes based on her elevated Hgb A1c and was informed this puts her at greater risk of developing diabetes. She is taking metformin currently and continues to work on diet and exercise to decrease risk of diabetes. She denies polyphagia, nausea, or hypoglycemia.  At risk for diabetes Cindy [name] is at higher than average risk for developing diabetes due to her obesity and pre-diabetes. She currently denies polyuria or polydipsia.  Hyperlipidemia Cindy [name] has hyperlipidemia and has been trying to improve her cholesterol levels with intensive lifestyle modification including a low saturated fat diet, exercise and weight loss. She is on Pravachol and she denies any chest pain, claudication or myalgias.  ALLERGIES: Allergies  Allergen Reactions  . Codeine Other (See Comments)    hallucinations  . Crestor [Rosuvastatin]     REACTION: myalgias  . Other Other (See Comments)    NOVACAINE- blisters left side of mouth after receiving   . Amoxicillin Rash  . Erythromycin Other (See Comments)    cramps  . Sulfa Antibiotics Rash  . Sulfonamide Derivatives Rash    MEDICATIONS: Current Outpatient Medications on File Prior to Visit  Medication Sig Dispense Refill  . aspirin 81 MG tablet Take 81 mg by  mouth daily.    . cholecalciferol (VITAMIN D) 1000 units tablet Take 2,000 Units by mouth daily.     . cycloSPORINE (RESTASIS) 0.05 % ophthalmic emulsion Place 1 drop into both eyes daily as needed. Reported on 07/09/2015    . DULoxetine (CYMBALTA) 30 MG capsule TAKE ONE CAPSULE BY MOUTH DAILY 30 capsule 0  . fluticasone (FLONASE) 50 MCG/ACT nasal spray Place 1 spray into both nostrils as needed.     Marland Kitchen levocetirizine (XYZAL) 5 MG tablet Take 5 mg by mouth as needed.   3  . metFORMIN (GLUCOPHAGE) 500 MG tablet Take 1 tablet (500 mg total) by mouth daily with breakfast. 30 tablet 0  . pravastatin (PRAVACHOL) 80 MG tablet TAKE ONE-HALF (1/2) TABLET BY MOUTH DAILY 45 tablet 1  . Turmeric 500 MG CAPS Take by mouth.     No current facility-administered medications on file prior to visit.     PAST MEDICAL HISTORY: Past Medical History:  Diagnosis Date  . Abnormal liver function tests 07/19/2015  . Allergic state 02/01/2010   Qualifier: Diagnosis of  By: Regis Bill MD, Standley Brooking   . Allergy   . Anxiety   . Back pain   . Chicken pox 72 yrs old  . Depressive disorder, not elsewhere classified   . Diabetes ([place])    diet controlled- no medicines   . Fibroid   . History of fractured kneecap    right   . Hyperglycemia   . Hyperlipidemia   . Infertility, female   .  Joint pain   . Knee pain, left 10/05/2011   Follows with Dr Margarette [place]  . Measles as a child  . Medicare annual wellness visit, subsequent 07/06/2014  . OA (osteoarthritis) of knee    left knee  . Obesity 10/05/2011  . Obesity 10/05/2011  . Osteoarthritis   . Osteopenia 06/26/2014  . Other and unspecified hyperlipidemia   . Overweight 10/05/2011  . Overweight(278.02) 10/05/2011  . Sleep apnea     On CPAP  . Sleep apnea   . Stye 11/19/2012  . Syncope 01/26/2017  . Thyroid disease    h/o hyperthyroid treated with radioactive iodine? from 16 to 20  . Type 2 diabetes mellitus with hyperlipidemia ([place])   . Varicose vein of leg     PAST SURGICAL  HISTORY: Past Surgical History:  Procedure Laterality Date  . BACK SURGERY     L5 discectomy, 2002. helpful  . CERVICAL BIOPSY  W/ LOOP ELECTRODE EXCISION    . CESAREAN SECTION  1987  . COLONOSCOPY    . CPAP    . KNEE SURGERY Right 2006   R knee fx. patella  . LEEP    . stitches in foot Right     SOCIAL HISTORY: Social History   Tobacco Use  . Smoking status: Never Smoker  . Smokeless tobacco: Never Used  Substance Use Topics  . Alcohol use: Yes    Alcohol/week: 0.6 oz    Types: 1 Glasses of wine per week  . Drug use: No    FAMILY HISTORY: Family History  Problem Relation Age of Onset  . Hyperlipidemia Mother   . Dementia Mother   . Obesity Mother   . Hyperlipidemia Father   . Heart disease Father        bypass, CAD  . Sleep apnea Father   . Hyperlipidemia Sister   . Diabetes Sister        type 2  . Colon polyps Sister   . Dementia Maternal Grandmother        alzheimer  . Alcohol abuse Maternal Grandfather   . Diabetes Paternal Grandmother        type 2?  . Colon cancer Neg Hx   . Esophageal cancer Neg Hx   . Rectal cancer Neg Hx   . Stomach cancer Neg Hx     ROS: Review of Systems  Constitutional: Negative for weight loss.  Cardiovascular: Negative for chest pain and claudication.  Gastrointestinal: Negative for nausea.  Genitourinary: Negative for frequency.  Musculoskeletal: Negative for myalgias.  Endo/Heme/Allergies: Negative for polydipsia.       Negative polyphagia Negative hypoglycemia    PHYSICAL EXAM: Blood pressure 117/63, pulse 62, temperature 98.1 F (36.7 C), temperature source Oral, height 5\' 5"  (1.651 m), weight 163 lb (73.9 kg), last menstrual period 04/04/2000, SpO2 99 %. Body mass index is 27.12 kg/m. Physical Exam  Constitutional: She is oriented to person, place, and time. She appears well-developed and well-nourished.  Cardiovascular: Normal rate.  Pulmonary/Chest: Effort normal.  Musculoskeletal: Normal range of motion.    Neurological: She is oriented to person, place, and time.  Skin: Skin is warm and dry.  Psychiatric: She has a normal mood and affect. Her behavior is normal.  Vitals reviewed.   RECENT LABS AND TESTS: BMET    Component Value Date/Time   NA 138 01/10/2017 0938   K 4.9 01/10/2017 0938   CL 101 01/10/2017 0938   CO2 24 01/10/2017 0938   GLUCOSE 101 (H) 01/10/2017 4765  GLUCOSE 112 (H) 08/05/2016 0956   BUN 24 01/10/2017 0938   CREATININE 0.72 01/10/2017 0938   CALCIUM 9.9 01/10/2017 0938   GFRNONAA 86 01/10/2017 0938   GFRAA 99 01/10/2017 0938   Lab Results  Component Value Date   HGBA1C 5.8 (H) 01/10/2017   HGBA1C 6.1 08/05/2016   HGBA1C 5.9 07/06/2015   HGBA1C 5.6 01/02/2015   HGBA1C 6.2 07/04/2014   Lab Results  Component Value Date   INSULIN 11.7 01/10/2017   CBC    Component Value Date/Time   WBC 6.2 01/10/2017 0938   WBC 7.4 08/05/2016 0956   RBC 4.51 01/10/2017 0938   RBC 4.56 08/05/2016 0956   HGB 13.2 01/10/2017 0938   HCT 41.3 01/10/2017 0938   PLT 308.0 08/05/2016 0956   MCV 92 01/10/2017 0938   MCH 29.3 01/10/2017 0938   MCHC 32.0 01/10/2017 0938   MCHC 33.6 08/05/2016 0956   RDW 14.0 01/10/2017 0938   LYMPHSABS 2.1 01/10/2017 0938   MONOABS 0.7 08/05/2016 0956   EOSABS 0.2 01/10/2017 0938   BASOSABS 0.1 01/10/2017 0938   Iron/TIBC/Ferritin/ %Sat No results found for: IRON, TIBC, FERRITIN, IRONPCTSAT Lipid Panel     Component Value Date/Time   CHOL 241 (H) 01/10/2017 0938   TRIG 81 01/10/2017 0938   TRIG 94 04/07/2006 1019   HDL 60 01/10/2017 0938   CHOLHDL 4 08/05/2016 0956   VLDL 20.0 08/05/2016 0956   LDLCALC 165 (H) 01/10/2017 0938   LDLDIRECT 145.4 11/14/2012 0823   Hepatic Function Panel     Component Value Date/Time   PROT 6.8 01/10/2017 0938   ALBUMIN 4.2 01/10/2017 0938   AST 17 01/10/2017 0938   ALT 25 01/10/2017 0938   ALKPHOS 74 01/10/2017 0938   BILITOT 0.4 01/10/2017 0938   BILIDIR 0.1 10/29/2013 0831       Component Value Date/Time   TSH 1.020 01/10/2017 0938   TSH 1.21 08/05/2016 0956   TSH 1.56 07/06/2015 0936    ASSESSMENT AND PLAN: Prediabetes - Plan: metFORMIN (GLUCOPHAGE) 500 MG tablet  Other hyperlipidemia  At risk for diabetes mellitus  Class 1 obesity with serious comorbidity and body mass index (BMI) of 30.0 to 30.9 in adult, unspecified obesity type - Starting BMI greater then 30  PLAN:  Pre-Diabetes Cindy [name] will continue to work on weight loss, exercise, and decreasing simple carbohydrates in her diet to help decrease the risk of diabetes. We dicussed metformin including benefits and risks. She was informed that eating too many simple carbohydrates or too many calories at one sitting increases the likelihood of GI side effects. Cindy [name] agrees to continue taking metformin 500 mg q AM #30 and we will refill for 1 month. Cindy [name] agrees to follow up with our clinic in 2 weeks as directed to monitor her progress.  Diabetes risk counselling Cindy [name] was given extended (15 minutes) diabetes prevention counseling today. She is 72 y.o. female and has risk factors for diabetes including obesity and pre-diabetes. We discussed intensive lifestyle modifications today with an emphasis on weight loss as well as increasing exercise and decreasing simple carbohydrates in her diet.  Hyperlipidemia Cindy [name] was informed of the American Heart Association Guidelines emphasizing intensive lifestyle modifications as the first line treatment for hyperlipidemia. We discussed many lifestyle modifications today in depth, and Cindy [name] will continue to work on decreasing saturated fats such as fatty red meat, butter and many fried foods. She will also increase vegetables and lean protein in her diet and continue to work on  exercise and weight loss efforts. Cindy [name] will continue her medications as prescribed, will have her repeat labs on her scheduled physical appointment with her primary care physician. Cindy [name] agrees to follow  up with our clinic in 2 weeks.  Obesity Cindy [name] is currently in the action stage of change. As such, her goal is to continue with weight loss efforts She has agreed to follow a lower carbohydrate, vegetable and lean protein rich diet plan Cindy [name] has been instructed to work up to a goal of 150 minutes of combined cardio and strengthening exercise per week for weight loss and overall health benefits. We discussed the following Behavioral Modification Strategies today: increasing lean protein intake and decreasing simple carbohydrates    Cindy [name] has agreed to follow up with our clinic in 2 weeks. She was informed of the importance of frequent follow up visits to maximize her success with intensive lifestyle modifications for her multiple health conditions.   OBESITY BEHAVIORAL INTERVENTION VISIT  Today's visit was # 10 out of 22.  Starting weight: 185 lbs Starting date: 01/10/17 Today's weight : 163 lbs  Today's date: 06/08/2017 Total lbs lost to date: 22 (Patients must lose 7 lbs in the first 6 months to continue with counseling)   ASK: We discussed the diagnosis of obesity with Lurlean Leyden today and Kalena agreed to give [name] permission to discuss obesity behavioral modification therapy today.  ASSESS: Brylie has the diagnosis of obesity and her BMI today is 27.12 Maleeya is in the action stage of change   ADVISE: Marget was educated on the multiple health risks of obesity as well as the benefit of weight loss to improve her health. She was advised of the need for long term treatment and the importance of lifestyle modifications.  AGREE: Multiple dietary modification options and treatment options were discussed and  Zoria agreed to the above obesity treatment plan.   Wilhemena Durie, am acting as transcriptionist for Lacy Duverney, PA-C I, Lacy Duverney [facility], have reviewed this note and agree with its content

## 2017-06-18 ENCOUNTER — Other Ambulatory Visit: Payer: Self-pay | Admitting: Family Medicine

## 2017-06-18 ENCOUNTER — Other Ambulatory Visit (INDEPENDENT_AMBULATORY_CARE_PROVIDER_SITE_OTHER): Payer: Self-pay | Admitting: Physician Assistant

## 2017-06-18 DIAGNOSIS — R7303 Prediabetes: Secondary | ICD-10-CM

## 2017-06-21 DIAGNOSIS — G4733 Obstructive sleep apnea (adult) (pediatric): Secondary | ICD-10-CM | POA: Diagnosis not present

## 2017-06-22 ENCOUNTER — Ambulatory Visit (INDEPENDENT_AMBULATORY_CARE_PROVIDER_SITE_OTHER): Payer: Medicare HMO | Admitting: Physician Assistant

## 2017-06-22 ENCOUNTER — Other Ambulatory Visit (INDEPENDENT_AMBULATORY_CARE_PROVIDER_SITE_OTHER): Payer: Self-pay | Admitting: Physician Assistant

## 2017-06-22 ENCOUNTER — Other Ambulatory Visit: Payer: Self-pay | Admitting: Family Medicine

## 2017-06-22 VITALS — BP 114/69 | HR 56 | Temp 97.5°F | Ht 65.0 in | Wt 162.0 lb

## 2017-06-22 DIAGNOSIS — R7303 Prediabetes: Secondary | ICD-10-CM

## 2017-06-22 DIAGNOSIS — E669 Obesity, unspecified: Secondary | ICD-10-CM

## 2017-06-22 DIAGNOSIS — Z6830 Body mass index (BMI) 30.0-30.9, adult: Secondary | ICD-10-CM

## 2017-06-22 NOTE — Progress Notes (Signed)
Office: [phone]  /  Fax: [phone]   HPI:   Chief Complaint: OBESITY Cindy [name] is here to discuss her progress with her obesity treatment plan. She is on the lower carbohydrate, vegetable and lean protein rich diet plan and is following her eating plan approximately 60 % of the time. She states she is doing exercise class for 60 minutes 1 times per week. Cindy [name] continues to do well with weight loss. She states she did enjoy the low carbohydrate meal plan, as it required more pre-planning. She would like to go back to the [employer] plan.  Her weight is 162 lb (73.5 kg) today and has had a weight loss of 1 pound over a period of 2 weeks since her last visit. She has lost 23 lbs since starting treatment with [name].  Pre-Diabetes Cindy [name] has a diagnosis of pre-diabetes based on her elevated Hgb A1c and was informed this puts her at greater risk of developing diabetes. She is taking metformin currently and continues to work on diet and exercise to decrease risk of diabetes. She denies polyphagia, nausea or hypoglycemia.  ALLERGIES: Allergies  Allergen Reactions  . Codeine Other (See Comments)    hallucinations  . Crestor [Rosuvastatin]     REACTION: myalgias  . Other Other (See Comments)    NOVACAINE- blisters left side of mouth after receiving   . Amoxicillin Rash  . Erythromycin Other (See Comments)    cramps  . Sulfa Antibiotics Rash  . Sulfonamide Derivatives Rash    MEDICATIONS: Current Outpatient Medications on File Prior to Visit  Medication Sig Dispense Refill  . aspirin 81 MG tablet Take 81 mg by mouth daily.    . cholecalciferol (VITAMIN D) 1000 units tablet Take 2,000 Units by mouth daily.     . cycloSPORINE (RESTASIS) 0.05 % ophthalmic emulsion Place 1 drop into both eyes daily as needed. Reported on 07/09/2015    . DULoxetine (CYMBALTA) 30 MG capsule TAKE ONE CAPSULE BY MOUTH DAILY 30 capsule 0  . fluticasone (FLONASE) 50 MCG/ACT nasal spray Place 1 spray into both  nostrils as needed.     Marland Kitchen levocetirizine (XYZAL) 5 MG tablet Take 5 mg by mouth as needed.   3  . metFORMIN (GLUCOPHAGE) 500 MG tablet Take 1 tablet (500 mg total) by mouth daily with breakfast. 30 tablet 0  . pravastatin (PRAVACHOL) 80 MG tablet TAKE ONE-HALF (1/2) TABLET BY MOUTH DAILY 45 tablet 1  . Turmeric 500 MG CAPS Take by mouth.     No current facility-administered medications on file prior to visit.     PAST MEDICAL HISTORY: Past Medical History:  Diagnosis Date  . Abnormal liver function tests 07/19/2015  . Allergic state 02/01/2010   Qualifier: Diagnosis of  By: Regis Bill MD, Standley Brooking   . Allergy   . Anxiety   . Back pain   . Chicken pox 72 yrs old  . Depressive disorder, not elsewhere classified   . Diabetes ([place])    diet controlled- no medicines   . Fibroid   . History of fractured kneecap    right   . Hyperglycemia   . Hyperlipidemia   . Infertility, female   . Joint pain   . Knee pain, left 10/05/2011   Follows with Dr Margarette [place]  . Measles as a child  . Medicare annual wellness visit, subsequent 07/06/2014  . OA (osteoarthritis) of knee    left knee  . Obesity 10/05/2011  . Obesity 10/05/2011  . Osteoarthritis   .  Osteopenia 06/26/2014  . Other and unspecified hyperlipidemia   . Overweight 10/05/2011  . Overweight(278.02) 10/05/2011  . Sleep apnea     On CPAP  . Sleep apnea   . Stye 11/19/2012  . Syncope 01/26/2017  . Thyroid disease    h/o hyperthyroid treated with radioactive iodine? from 16 to 20  . Type 2 diabetes mellitus with hyperlipidemia ([place])   . Varicose vein of leg     PAST SURGICAL HISTORY: Past Surgical History:  Procedure Laterality Date  . BACK SURGERY     L5 discectomy, 2002. helpful  . CERVICAL BIOPSY  W/ LOOP ELECTRODE EXCISION    . CESAREAN SECTION  1987  . COLONOSCOPY    . CPAP    . KNEE SURGERY Right 2006   R knee fx. patella  . LEEP    . stitches in foot Right     SOCIAL HISTORY: Social History   Tobacco Use  . Smoking status:  Never Smoker  . Smokeless tobacco: Never Used  Substance Use Topics  . Alcohol use: Yes    Alcohol/week: 0.6 oz    Types: 1 Glasses of wine per week  . Drug use: No    FAMILY HISTORY: Family History  Problem Relation Age of Onset  . Hyperlipidemia Mother   . Dementia Mother   . Obesity Mother   . Hyperlipidemia Father   . Heart disease Father        bypass, CAD  . Sleep apnea Father   . Hyperlipidemia Sister   . Diabetes Sister        type 2  . Colon polyps Sister   . Dementia Maternal Grandmother        alzheimer  . Alcohol abuse Maternal Grandfather   . Diabetes Paternal Grandmother        type 2?  . Colon cancer Neg Hx   . Esophageal cancer Neg Hx   . Rectal cancer Neg Hx   . Stomach cancer Neg Hx     ROS: Review of Systems  Constitutional: Positive for weight loss.  Gastrointestinal: Negative for nausea.  Endo/Heme/Allergies:       Negative polyphagia Negative hypoglycemia    PHYSICAL EXAM: Blood pressure 114/69, pulse (!) 56, temperature (!) 97.5 F (36.4 C), temperature source Oral, height 5\' 5"  (1.651 m), weight 162 lb (73.5 kg), last menstrual period 04/04/2000, SpO2 100 %. Body mass index is 26.96 kg/m. Physical Exam  Constitutional: She is oriented to person, place, and time. She appears well-developed and well-nourished.  Cardiovascular: Bradycardia present.  Pulmonary/Chest: Effort normal.  Musculoskeletal: Normal range of motion.  Neurological: She is oriented to person, place, and time.  Skin: Skin is warm and dry.  Psychiatric: She has a normal mood and affect. Her behavior is normal.  Vitals reviewed.   RECENT LABS AND TESTS: BMET    Component Value Date/Time   NA 138 01/10/2017 0938   K 4.9 01/10/2017 0938   CL 101 01/10/2017 0938   CO2 24 01/10/2017 0938   GLUCOSE 101 (H) 01/10/2017 0938   GLUCOSE 112 (H) 08/05/2016 0956   BUN 24 01/10/2017 0938   CREATININE 0.72 01/10/2017 0938   CALCIUM 9.9 01/10/2017 0938   GFRNONAA 86  01/10/2017 0938   GFRAA 99 01/10/2017 0938   Lab Results  Component Value Date   HGBA1C 5.8 (H) 01/10/2017   HGBA1C 6.1 08/05/2016   HGBA1C 5.9 07/06/2015   HGBA1C 5.6 01/02/2015   HGBA1C 6.2 07/04/2014   Lab Results  Component Value Date   INSULIN 11.7 01/10/2017   CBC    Component Value Date/Time   WBC 6.2 01/10/2017 0938   WBC 7.4 08/05/2016 0956   RBC 4.51 01/10/2017 0938   RBC 4.56 08/05/2016 0956   HGB 13.2 01/10/2017 0938   HCT 41.3 01/10/2017 0938   PLT 308.0 08/05/2016 0956   MCV 92 01/10/2017 0938   MCH 29.3 01/10/2017 0938   MCHC 32.0 01/10/2017 0938   MCHC 33.6 08/05/2016 0956   RDW 14.0 01/10/2017 0938   LYMPHSABS 2.1 01/10/2017 0938   MONOABS 0.7 08/05/2016 0956   EOSABS 0.2 01/10/2017 0938   BASOSABS 0.1 01/10/2017 0938   Iron/TIBC/Ferritin/ %Sat No results found for: IRON, TIBC, FERRITIN, IRONPCTSAT Lipid Panel     Component Value Date/Time   CHOL 241 (H) 01/10/2017 0938   TRIG 81 01/10/2017 0938   TRIG 94 04/07/2006 1019   HDL 60 01/10/2017 0938   CHOLHDL 4 08/05/2016 0956   VLDL 20.0 08/05/2016 0956   LDLCALC 165 (H) 01/10/2017 0938   LDLDIRECT 145.4 11/14/2012 0823   Hepatic Function Panel     Component Value Date/Time   PROT 6.8 01/10/2017 0938   ALBUMIN 4.2 01/10/2017 0938   AST 17 01/10/2017 0938   ALT 25 01/10/2017 0938   ALKPHOS 74 01/10/2017 0938   BILITOT 0.4 01/10/2017 0938   BILIDIR 0.1 10/29/2013 0831      Component Value Date/Time   TSH 1.020 01/10/2017 0938   TSH 1.21 08/05/2016 0956   TSH 1.56 07/06/2015 0936    ASSESSMENT AND PLAN: Prediabetes  Class 1 obesity with serious comorbidity and body mass index (BMI) of 30.0 to 30.9 in adult, unspecified obesity type - Starting BMI greater then 30  PLAN:  Pre-Diabetes Zylpha will continue to work on weight loss, exercise, and decreasing simple carbohydrates in her diet to help decrease the risk of diabetes. We dicussed metformin including benefits and risks. She was  informed that eating too many simple carbohydrates or too many calories at one sitting increases the likelihood of GI side effects. Cindy [name] agrees to continue her medications as prescribed. Cindy [name] agrees to follow up with our clinic in 3 weeks as directed to monitor her progress.  We spent > than 50% of the 15 minute visit on the counseling as documented in the note.  Obesity Cindy [name] is currently in the action stage of change. As such, her goal is to continue with weight loss efforts She has agreed to follow the Pescatarian eating plan Cindy [name] has been instructed to work up to a goal of 150 minutes of combined cardio and strengthening exercise per week for weight loss and overall health benefits. We discussed the following Behavioral Modification Strategies today: increasing lean protein intake and work on meal planning and easy cooking plans   Cindy [name] has agreed to follow up with our clinic in 3 weeks. She was informed of the importance of frequent follow up visits to maximize her success with intensive lifestyle modifications for her multiple health conditions.   OBESITY BEHAVIORAL INTERVENTION VISIT  Today's visit was # 11 out of 22.  Starting weight: 185 lbs Starting date: 01/10/17 Today's weight : 162 lbs Today's date: 06/22/2017 Total lbs lost to date: 23 (Patients must lose 7 lbs in the first 6 months to continue with counseling)   ASK: We discussed the diagnosis of obesity with Cindy [name] today and Cindy [name] agreed to give [name] permission to discuss obesity behavioral modification therapy today.  ASSESS: Cindy [name]  has the diagnosis of obesity and her BMI today is 26.96 Cindy [name] is in the action stage of change   ADVISE: Cindy [name] was educated on the multiple health risks of obesity as well as the benefit of weight loss to improve her health. She was advised of the need for long term treatment and the importance of lifestyle modifications.  AGREE: Multiple dietary modification options and  treatment options were discussed and  Cindy [name] agreed to the above obesity treatment plan.   Cindy [name], am acting as transcriptionist for Cindy Duverney, PA-C I, Cindy [name] [facility], have reviewed this note and agree with its content

## 2017-07-05 ENCOUNTER — Ambulatory Visit (INDEPENDENT_AMBULATORY_CARE_PROVIDER_SITE_OTHER): Payer: Medicare HMO | Admitting: Family Medicine

## 2017-07-05 VITALS — BP 115/61 | HR 53 | Temp 97.5°F | Ht 65.0 in | Wt 162.0 lb

## 2017-07-05 DIAGNOSIS — E669 Obesity, unspecified: Secondary | ICD-10-CM | POA: Diagnosis not present

## 2017-07-05 DIAGNOSIS — R7303 Prediabetes: Secondary | ICD-10-CM | POA: Diagnosis not present

## 2017-07-05 DIAGNOSIS — Z6830 Body mass index (BMI) 30.0-30.9, adult: Secondary | ICD-10-CM

## 2017-07-05 MED ORDER — METFORMIN HCL 500 MG PO TABS: 500.0000 mg | ORAL_TABLET | Freq: Two times a day (BID) | ORAL | 0 refills | Status: DC

## 2017-07-05 NOTE — Progress Notes (Signed)
Office: [phone]  /  Fax: [phone]   HPI:   Chief Complaint: OBESITY Cindy [name] is here to discuss her progress with her obesity treatment plan. She is on the Pescatarian eating plan and is following her eating plan approximately 60 % of the time. She states she is doing zumba for 50 minutes 1.5 times per week. Cindy [name] has done well maintaining weight over last 2 to 3 weeks but is frustrated she is struggling. She notes polyphagia in the evening, especially when staying up late.  Her weight is 162 lb (73.5 kg) today and has not lost weight since her last visit. She has lost 23 lbs since starting treatment with [name].  Pre-Diabetes Cindy [name] has a diagnosis of pre-diabetes based on her elevated Hgb A1c and was informed this puts her at greater risk of developing diabetes. She is taking metformin currently and continues to work on diet prescription and exercise to decrease risk of diabetes. She notes increased evening polyphagia, worse in the last 2 weeks. She denies nausea, vomiting, or hypoglycemia.  ALLERGIES: Allergies  Allergen Reactions  . Codeine Other (See Comments)    hallucinations  . Crestor [Rosuvastatin]     REACTION: myalgias  . Other Other (See Comments)    NOVACAINE- blisters left side of mouth after receiving   . Amoxicillin Rash  . Erythromycin Other (See Comments)    cramps  . Sulfa Antibiotics Rash  . Sulfonamide Derivatives Rash    MEDICATIONS: Current Outpatient Medications on File Prior to Visit  Medication Sig Dispense Refill  . aspirin 81 MG tablet Take 81 mg by mouth daily.    . cholecalciferol (VITAMIN D) 1000 units tablet Take 2,000 Units by mouth daily.     . cycloSPORINE (RESTASIS) 0.05 % ophthalmic emulsion Place 1 drop into both eyes daily as needed. Reported on 07/09/2015    . DULoxetine (CYMBALTA) 30 MG capsule TAKE ONE CAPSULE BY MOUTH DAILY 30 capsule 0  . fluticasone (FLONASE) 50 MCG/ACT nasal spray Place 1 spray into both nostrils as needed.     Marland Kitchen  levocetirizine (XYZAL) 5 MG tablet Take 5 mg by mouth as needed.   3  . metFORMIN (GLUCOPHAGE) 500 MG tablet Take 1 tablet (500 mg total) by mouth daily with breakfast. 30 tablet 0  . pravastatin (PRAVACHOL) 80 MG tablet TAKE ONE-HALF (1/2) TABLET BY MOUTH DAILY 45 tablet 0  . Turmeric 500 MG CAPS Take by mouth.     No current facility-administered medications on file prior to visit.     PAST MEDICAL HISTORY: Past Medical History:  Diagnosis Date  . Abnormal liver function tests 07/19/2015  . Allergic state 02/01/2010   Qualifier: Diagnosis of  By: Regis Bill MD, Standley Brooking   . Allergy   . Anxiety   . Back pain   . Chicken pox 72 yrs old  . Depressive disorder, not elsewhere classified   . Diabetes ([place])    diet controlled- no medicines   . Fibroid   . History of fractured kneecap    right   . Hyperglycemia   . Hyperlipidemia   . Infertility, female   . Joint pain   . Knee pain, left 10/05/2011   Follows with Dr Margarette Canada  . Measles as a child  . Medicare annual wellness visit, subsequent 07/06/2014  . OA (osteoarthritis) of knee    left knee  . Obesity 10/05/2011  . Obesity 10/05/2011  . Osteoarthritis   . Osteopenia 06/26/2014  . Other and unspecified hyperlipidemia   .  Overweight 10/05/2011  . Overweight(278.02) 10/05/2011  . Sleep apnea     On CPAP  . Sleep apnea   . Stye 11/19/2012  . Syncope 01/26/2017  . Thyroid disease    h/o hyperthyroid treated with radioactive iodine? from 16 to 20  . Type 2 diabetes mellitus with hyperlipidemia ([place])   . Varicose vein of leg     PAST SURGICAL HISTORY: Past Surgical History:  Procedure Laterality Date  . BACK SURGERY     L5 discectomy, 2002. helpful  . CERVICAL BIOPSY  W/ LOOP ELECTRODE EXCISION    . CESAREAN SECTION  1987  . COLONOSCOPY    . CPAP    . KNEE SURGERY Right 2006   R knee fx. patella  . LEEP    . stitches in foot Right     SOCIAL HISTORY: Social History   Tobacco Use  . Smoking status: Never Smoker  . Smokeless  tobacco: Never Used  Substance Use Topics  . Alcohol use: Yes    Alcohol/week: 0.6 oz    Types: 1 Glasses of wine per week  . Drug use: No    FAMILY HISTORY: Family History  Problem Relation Age of Onset  . Hyperlipidemia Mother   . Dementia Mother   . Obesity Mother   . Hyperlipidemia Father   . Heart disease Father        bypass, CAD  . Sleep apnea Father   . Hyperlipidemia Sister   . Diabetes Sister        type 2  . Colon polyps Sister   . Dementia Maternal Grandmother        alzheimer  . Alcohol abuse Maternal Grandfather   . Diabetes Paternal Grandmother        type 2?  . Colon cancer Neg Hx   . Esophageal cancer Neg Hx   . Rectal cancer Neg Hx   . Stomach cancer Neg Hx     ROS: Review of Systems  Constitutional: Negative for weight loss.  Gastrointestinal: Negative for nausea and vomiting.  Endo/Heme/Allergies:       Negative hypoglycemia Positive polyphagia    PHYSICAL EXAM: Blood pressure 115/61, pulse (!) 53, temperature (!) 97.5 F (36.4 C), temperature source Oral, height 5\' 5"  (1.651 m), weight 162 lb (73.5 kg), last menstrual period 04/04/2000, SpO2 99 %. Body mass index is 26.96 kg/m. Physical Exam  Constitutional: She is oriented to person, place, and time. She appears well-developed and well-nourished.  Cardiovascular: Normal rate.  Pulmonary/Chest: Effort normal.  Musculoskeletal: Normal range of motion.  Neurological: She is oriented to person, place, and time.  Skin: Skin is warm and dry.  Psychiatric: She has a normal mood and affect. Her behavior is normal.  Vitals reviewed.   RECENT LABS AND TESTS: BMET    Component Value Date/Time   NA 138 01/10/2017 0938   K 4.9 01/10/2017 0938   CL 101 01/10/2017 0938   CO2 24 01/10/2017 0938   GLUCOSE 101 (H) 01/10/2017 0938   GLUCOSE 112 (H) 08/05/2016 0956   BUN 24 01/10/2017 0938   CREATININE 0.72 01/10/2017 0938   CALCIUM 9.9 01/10/2017 0938   GFRNONAA 86 01/10/2017 0938   GFRAA 99  01/10/2017 0938   Lab Results  Component Value Date   HGBA1C 5.8 (H) 01/10/2017   HGBA1C 6.1 08/05/2016   HGBA1C 5.9 07/06/2015   HGBA1C 5.6 01/02/2015   HGBA1C 6.2 07/04/2014   Lab Results  Component Value Date   INSULIN 11.7 01/10/2017  CBC    Component Value Date/Time   WBC 6.2 01/10/2017 0938   WBC 7.4 08/05/2016 0956   RBC 4.51 01/10/2017 0938   RBC 4.56 08/05/2016 0956   HGB 13.2 01/10/2017 0938   HCT 41.3 01/10/2017 0938   PLT 308.0 08/05/2016 0956   MCV 92 01/10/2017 0938   MCH 29.3 01/10/2017 0938   MCHC 32.0 01/10/2017 0938   MCHC 33.6 08/05/2016 0956   RDW 14.0 01/10/2017 0938   LYMPHSABS 2.1 01/10/2017 0938   MONOABS 0.7 08/05/2016 0956   EOSABS 0.2 01/10/2017 0938   BASOSABS 0.1 01/10/2017 0938   Iron/TIBC/Ferritin/ %Sat No results found for: IRON, TIBC, FERRITIN, IRONPCTSAT Lipid Panel     Component Value Date/Time   CHOL 241 (H) 01/10/2017 0938   TRIG 81 01/10/2017 0938   TRIG 94 04/07/2006 1019   HDL 60 01/10/2017 0938   CHOLHDL 4 08/05/2016 0956   VLDL 20.0 08/05/2016 0956   LDLCALC 165 (H) 01/10/2017 0938   LDLDIRECT 145.4 11/14/2012 0823   Hepatic Function Panel     Component Value Date/Time   PROT 6.8 01/10/2017 0938   ALBUMIN 4.2 01/10/2017 0938   AST 17 01/10/2017 0938   ALT 25 01/10/2017 0938   ALKPHOS 74 01/10/2017 0938   BILITOT 0.4 01/10/2017 0938   BILIDIR 0.1 10/29/2013 0831      Component Value Date/Time   TSH 1.020 01/10/2017 0938   TSH 1.21 08/05/2016 0956   TSH 1.56 07/06/2015 0936    ASSESSMENT AND PLAN: Prediabetes - Plan: metFORMIN (GLUCOPHAGE) 500 MG tablet  Class 1 obesity with serious comorbidity and body mass index (BMI) of 30.0 to 30.9 in adult, unspecified obesity type - Starting BMI greater then 30  PLAN:  Pre-Diabetes Cindy [name] will continue to work on weight loss, exercise, and decreasing simple carbohydrates in her diet to help decrease the risk of diabetes. We dicussed metformin including benefits and  risks. She was informed that eating too many simple carbohydrates or too many calories at one sitting increases the likelihood of GI side effects. Kinza agrees to increase metformin 500 mg BID #60 with no refills. Cindy [name] agrees to follow up with our clinic in 2 weeks as directed to monitor her progress and we will check labs at that time.  Obesity Cindy [name] is currently in the action stage of change. As such, her goal is to continue with weight loss efforts She has agreed to follow the Pescatarian eating plan Cindy [name] has been instructed to work up to a goal of 150 minutes of combined cardio and strengthening exercise per week for weight loss and overall health benefits. We discussed the following Behavioral Modification Strategies today: increasing lean protein intake and decreasing simple carbohydrates    Cindy [name] has agreed to follow up with our clinic in 2 weeks. She was informed of the importance of frequent follow up visits to maximize her success with intensive lifestyle modifications for her multiple health conditions.   OBESITY BEHAVIORAL INTERVENTION VISIT  Today's visit was # 12 out of 22.  Starting weight: 185 lbs Starting date: 01/10/17 Today's weight : 162 lbs Today's date: 07/05/2017 Total lbs lost to date: 23 (Patients must lose 7 lbs in the first 6 months to continue with counseling)   ASK: We discussed the diagnosis of obesity with Cindy [name] today and Cindy [name] agreed to give [name] permission to discuss obesity behavioral modification therapy today.  ASSESS: Jahnya has the diagnosis of obesity and her BMI today is 26.96 Cindy [name] is in  the action stage of change   ADVISE: Cindy [name] was educated on the multiple health risks of obesity as well as the benefit of weight loss to improve her health. She was advised of the need for long term treatment and the importance of lifestyle modifications.  AGREE: Multiple dietary modification options and treatment options were discussed and  Cindy [name]  agreed to the above obesity treatment plan.  I, Trixie Dredge, am acting as transcriptionist for Dennard Nip, MD  I have reviewed the above documentation for accuracy and completeness, and I agree with the above. -Dennard Nip, MD

## 2017-07-10 ENCOUNTER — Ambulatory Visit (INDEPENDENT_AMBULATORY_CARE_PROVIDER_SITE_OTHER): Payer: Medicare HMO | Admitting: Family Medicine

## 2017-07-20 ENCOUNTER — Ambulatory Visit (INDEPENDENT_AMBULATORY_CARE_PROVIDER_SITE_OTHER): Payer: Medicare HMO | Admitting: Family Medicine

## 2017-07-20 VITALS — BP 111/62 | HR 52 | Temp 97.8°F | Ht 65.0 in | Wt 161.0 lb

## 2017-07-20 DIAGNOSIS — R7303 Prediabetes: Secondary | ICD-10-CM | POA: Diagnosis not present

## 2017-07-20 DIAGNOSIS — Z6830 Body mass index (BMI) 30.0-30.9, adult: Secondary | ICD-10-CM

## 2017-07-20 DIAGNOSIS — E669 Obesity, unspecified: Secondary | ICD-10-CM | POA: Diagnosis not present

## 2017-07-20 DIAGNOSIS — E785 Hyperlipidemia, unspecified: Secondary | ICD-10-CM | POA: Diagnosis not present

## 2017-07-20 DIAGNOSIS — E559 Vitamin D deficiency, unspecified: Secondary | ICD-10-CM

## 2017-07-20 NOTE — Progress Notes (Signed)
Office: [phone]  /  Fax: [phone]   HPI:   Chief Complaint: OBESITY Cindy [name] is here to discuss her progress with her obesity treatment plan. She is on the  follow the Pescatarian eating plan and is following her eating plan approximately 40 % of the time. She states she is exercising 0 minutes 0 times per week. Cindy [name] has done well mostly portion control and smarter food choices while having company and had celebration eating for her birthday.  Her weight is 161 lb (73 kg) today and has had a weight loss of 1 pound over a period of 2 weeks since her last visit. She has lost 24 lbs since starting treatment with [name].  Pre-Diabetes Cindy [name] has a diagnosis of pre-diabetes based on her elevated Hgb A1c and was informed this puts her at greater risk of developing diabetes. She is on metformin but has been forgetting her 2nd dose. She continues to work on diet and exercise to decrease risk of diabetes. She denies nausea, vomiting, or hypoglycemia.  Vitamin D Deficiency Cindy [name] has a diagnosis of vitamin D deficiency. She is on Vit D prescription. Last level at goal. She denies nausea, vomiting or muscle weakness.  Hyperlipidemia Cindy [name] has hyperlipidemia and on Pravachol, attempting to control with diet, exercise and weight loss. She denies any chest pain, claudication or myalgias.  ALLERGIES: Allergies  Allergen Reactions  . Codeine Other (See Comments)    hallucinations  . Crestor [Rosuvastatin]     REACTION: myalgias  . Other Other (See Comments)    NOVACAINE- blisters left side of mouth after receiving   . Amoxicillin Rash  . Erythromycin Other (See Comments)    cramps  . Sulfa Antibiotics Rash  . Sulfonamide Derivatives Rash    MEDICATIONS: Current Outpatient Medications on File Prior to Visit  Medication Sig Dispense Refill  . aspirin 81 MG tablet Take 81 mg by mouth daily.    . cholecalciferol (VITAMIN D) 1000 units tablet Take 2,000 Units by mouth daily.     .  cycloSPORINE (RESTASIS) 0.05 % ophthalmic emulsion Place 1 drop into both eyes daily as needed. Reported on 07/09/2015    . DULoxetine (CYMBALTA) 30 MG capsule TAKE ONE CAPSULE BY MOUTH DAILY 30 capsule 0  . fluticasone (FLONASE) 50 MCG/ACT nasal spray Place 1 spray into both nostrils as needed.     Marland Kitchen levocetirizine (XYZAL) 5 MG tablet Take 5 mg by mouth as needed.   3  . metFORMIN (GLUCOPHAGE) 500 MG tablet Take 1 tablet (500 mg total) by mouth 2 (two) times daily with a meal. 60 tablet 0  . pravastatin (PRAVACHOL) 80 MG tablet TAKE ONE-HALF (1/2) TABLET BY MOUTH DAILY 45 tablet 0  . Turmeric 500 MG CAPS Take by mouth.     No current facility-administered medications on file prior to visit.     PAST MEDICAL HISTORY: Past Medical History:  Diagnosis Date  . Abnormal liver function tests 07/19/2015  . Allergic state 02/01/2010   Qualifier: Diagnosis of  By: Regis Bill MD, Standley Brooking   . Allergy   . Anxiety   . Back pain   . Chicken pox 72 yrs old  . Depressive disorder, not elsewhere classified   . Diabetes ([place])    diet controlled- no medicines   . Fibroid   . History of fractured kneecap    right   . Hyperglycemia   . Hyperlipidemia   . Infertility, female   . Joint pain   . Knee pain, left  10/05/2011   Follows with Dr Margarette Canada  . Measles as a child  . Medicare annual wellness visit, subsequent 07/06/2014  . OA (osteoarthritis) of knee    left knee  . Obesity 10/05/2011  . Obesity 10/05/2011  . Osteoarthritis   . Osteopenia 06/26/2014  . Other and unspecified hyperlipidemia   . Overweight 10/05/2011  . Overweight(278.02) 10/05/2011  . Sleep apnea     On CPAP  . Sleep apnea   . Stye 11/19/2012  . Syncope 01/26/2017  . Thyroid disease    h/o hyperthyroid treated with radioactive iodine? from 16 to 20  . Type 2 diabetes mellitus with hyperlipidemia ([place])   . Varicose vein of leg     PAST SURGICAL HISTORY: Past Surgical History:  Procedure Laterality Date  . BACK SURGERY     L5  discectomy, 2002. helpful  . CERVICAL BIOPSY  W/ LOOP ELECTRODE EXCISION    . CESAREAN SECTION  1987  . COLONOSCOPY    . CPAP    . KNEE SURGERY Right 2006   R knee fx. patella  . LEEP    . stitches in foot Right     SOCIAL HISTORY: Social History   Tobacco Use  . Smoking status: Never Smoker  . Smokeless tobacco: Never Used  Substance Use Topics  . Alcohol use: Yes    Alcohol/week: 0.6 oz    Types: 1 Glasses of wine per week  . Drug use: No    FAMILY HISTORY: Family History  Problem Relation Age of Onset  . Hyperlipidemia Mother   . Dementia Mother   . Obesity Mother   . Hyperlipidemia Father   . Heart disease Father        bypass, CAD  . Sleep apnea Father   . Hyperlipidemia Sister   . Diabetes Sister        type 2  . Colon polyps Sister   . Dementia Maternal Grandmother        alzheimer  . Alcohol abuse Maternal Grandfather   . Diabetes Paternal Grandmother        type 2?  . Colon cancer Neg Hx   . Esophageal cancer Neg Hx   . Rectal cancer Neg Hx   . Stomach cancer Neg Hx     ROS: Review of Systems  Constitutional: Positive for weight loss.  Cardiovascular: Negative for chest pain and claudication.  Gastrointestinal: Negative for nausea and vomiting.  Musculoskeletal: Negative for myalgias.       Negative muscle weakness  Endo/Heme/Allergies:       Negative hypoglycemia    PHYSICAL EXAM: Blood pressure 111/62, pulse (!) 52, temperature 97.8 F (36.6 C), temperature source Oral, height 5\' 5"  (1.651 m), weight 161 lb (73 kg), last menstrual period 04/04/2000, SpO2 99 %. Body mass index is 26.79 kg/m. Physical Exam  Constitutional: She is oriented to person, place, and time. She appears well-developed and well-nourished.  Cardiovascular: Normal rate.  Pulmonary/Chest: Effort normal.  Musculoskeletal: Normal range of motion.  Neurological: She is oriented to person, place, and time.  Skin: Skin is warm and dry.  Psychiatric: She has a normal mood  and affect. Her behavior is normal.  Vitals reviewed.   RECENT LABS AND TESTS: BMET    Component Value Date/Time   NA 138 01/10/2017 0938   K 4.9 01/10/2017 0938   CL 101 01/10/2017 0938   CO2 24 01/10/2017 0938   GLUCOSE 101 (H) 01/10/2017 0938   GLUCOSE 112 (H) 08/05/2016 1779  BUN 24 01/10/2017 0938   CREATININE 0.72 01/10/2017 0938   CALCIUM 9.9 01/10/2017 0938   GFRNONAA 86 01/10/2017 0938   GFRAA 99 01/10/2017 0938   Lab Results  Component Value Date   HGBA1C 5.8 (H) 01/10/2017   HGBA1C 6.1 08/05/2016   HGBA1C 5.9 07/06/2015   HGBA1C 5.6 01/02/2015   HGBA1C 6.2 07/04/2014   Lab Results  Component Value Date   INSULIN 11.7 01/10/2017   CBC    Component Value Date/Time   WBC 6.2 01/10/2017 0938   WBC 7.4 08/05/2016 0956   RBC 4.51 01/10/2017 0938   RBC 4.56 08/05/2016 0956   HGB 13.2 01/10/2017 0938   HCT 41.3 01/10/2017 0938   PLT 308.0 08/05/2016 0956   MCV 92 01/10/2017 0938   MCH 29.3 01/10/2017 0938   MCHC 32.0 01/10/2017 0938   MCHC 33.6 08/05/2016 0956   RDW 14.0 01/10/2017 0938   LYMPHSABS 2.1 01/10/2017 0938   MONOABS 0.7 08/05/2016 0956   EOSABS 0.2 01/10/2017 0938   BASOSABS 0.1 01/10/2017 0938   Iron/TIBC/Ferritin/ %Sat No results found for: IRON, TIBC, FERRITIN, IRONPCTSAT Lipid Panel     Component Value Date/Time   CHOL 241 (H) 01/10/2017 0938   TRIG 81 01/10/2017 0938   TRIG 94 04/07/2006 1019   HDL 60 01/10/2017 0938   CHOLHDL 4 08/05/2016 0956   VLDL 20.0 08/05/2016 0956   LDLCALC 165 (H) 01/10/2017 0938   LDLDIRECT 145.4 11/14/2012 0823   Hepatic Function Panel     Component Value Date/Time   PROT 6.8 01/10/2017 0938   ALBUMIN 4.2 01/10/2017 0938   AST 17 01/10/2017 0938   ALT 25 01/10/2017 0938   ALKPHOS 74 01/10/2017 0938   BILITOT 0.4 01/10/2017 0938   BILIDIR 0.1 10/29/2013 0831      Component Value Date/Time   TSH 1.020 01/10/2017 0938   TSH 1.21 08/05/2016 0956   TSH 1.56 07/06/2015 0936  Results for  LEILYN, FRAYRE (MRN [id number]) as of 07/20/2017 17:07  Ref. Range 01/10/2017 09:38  Vitamin D, 25-Hydroxy Latest Ref Range: 30.0 - 100.0 ng/mL 50.7    ASSESSMENT AND PLAN: Prediabetes - Plan: Comprehensive metabolic panel, Hemoglobin A1c, Insulin, random  Vitamin D deficiency - Plan: VITAMIN D 25 Hydroxy (Vit-D Deficiency, Fractures)  Hyperlipidemia, unspecified hyperlipidemia type - Plan: Lipid panel  Class 1 obesity with serious comorbidity and body mass index (BMI) of 30.0 to 30.9 in adult, unspecified obesity type - Beginning BMI >30  PLAN:  Pre-Diabetes Cindy [name] will continue to work on weight loss, diet, exercise, and decreasing simple carbohydrates in her diet to help decrease the risk of diabetes. We dicussed metformin including benefits and risks. She was informed that eating too many simple carbohydrates or too many calories at one sitting increases the likelihood of GI side effects. Cindy [name] agrees to continue taking metformin, ok to take both 500 mg pills q AM. We will check labs and Cindy [name] agrees to follow up with our clinic in 3 to 4 weeks as directed to monitor her progress.  Vitamin D Deficiency Cindy [name] was informed that low vitamin D levels contributes to fatigue and are associated with obesity, breast, and colon cancer. Cindy [name] agrees to continue taking prescription Vit D @50 ,000 IU every week and will follow up for routine testing of vitamin D, at least 2-3 times per year. She was informed of the risk of over-replacement of vitamin D and agrees to not increase her dose unless she discusses this with [name] first. We will  check labs and Cindy [name] agrees to follow up with our clinic in 3 to 4 weeks.  Hyperlipidemia Cindy [name] was informed of the American Heart Association Guidelines emphasizing intensive lifestyle modifications as the first line treatment for hyperlipidemia. We discussed many lifestyle modifications today in depth, and Cindy [name] will continue to work on decreasing saturated fats such as  fatty red meat, butter and many fried foods. She will also increase vegetables and lean protein in her diet and continue to work on diet, exercise and weight loss efforts. We will check labs and Cindy [name] agrees to follow up with our clinic in 3 to 4 weeks.  Obesity Cindy [name] is currently in the action stage of change. As such, her goal is to continue with weight loss efforts She has agreed to follow the Pescatarian eating plan Cindy [name] has been instructed to work up to a goal of 150 minutes of combined cardio and strengthening exercise per week for weight loss and overall health benefits. We discussed the following Behavioral Modification Strategies today: increasing lean protein intake and work on meal planning and easy cooking plans   Javonne has agreed to follow up with our clinic in 3 to 4 weeks. She was informed of the importance of frequent follow up visits to maximize her success with intensive lifestyle modifications for her multiple health conditions.   OBESITY BEHAVIORAL INTERVENTION VISIT  Today's visit was # 13 out of 22.  Starting weight: 185 lbs Starting date: 01/10/17 Today's weight : 161 lbs  Today's date: 07/20/2017 Total lbs lost to date: 24 (Patients must lose 7 lbs in the first 6 months to continue with counseling)   ASK: We discussed the diagnosis of obesity with Cindy [name] today and Chance agreed to give [name] permission to discuss obesity behavioral modification therapy today.  ASSESS: Markiah has the diagnosis of obesity and her BMI today is 26.79 Nadeen is in the action stage of change   ADVISE: Nikole was educated on the multiple health risks of obesity as well as the benefit of weight loss to improve her health. She was advised of the need for long term treatment and the importance of lifestyle modifications.  AGREE: Multiple dietary modification options and treatment options were discussed and  Keenan agreed to the above obesity treatment plan.  I, Trixie Dredge, am  acting as transcriptionist for Dennard Nip, MD  I have reviewed the above documentation for accuracy and completeness, and I agree with the above. -Dennard Nip, MD

## 2017-07-21 LAB — VITAMIN D 25 HYDROXY (VIT D DEFICIENCY, FRACTURES): VIT D 25 HYDROXY: 58.4 ng/mL (ref 30.0–100.0)

## 2017-07-21 LAB — COMPREHENSIVE METABOLIC PANEL
A/G RATIO: 1.4 (ref 1.2–2.2)
ALBUMIN: 3.9 g/dL (ref 3.5–4.8)
ALK PHOS: 72 IU/L (ref 39–117)
ALT: 11 IU/L (ref 0–32)
AST: 14 IU/L (ref 0–40)
BUN / CREAT RATIO: 35 — AB (ref 12–28)
BUN: 29 mg/dL — AB (ref 8–27)
CHLORIDE: 105 mmol/L (ref 96–106)
CO2: 25 mmol/L (ref 20–29)
Calcium: 10.1 mg/dL (ref 8.7–10.3)
Creatinine, Ser: 0.82 mg/dL (ref 0.57–1.00)
GFR calc Af Amer: 84 mL/min/{1.73_m2} (ref >59)
GFR calc non Af Amer: 73 mL/min/{1.73_m2} (ref >59)
GLUCOSE: 97 mg/dL (ref 65–99)
Globulin, Total: 2.8 g/dL (ref 1.5–4.5)
POTASSIUM: 5.3 mmol/L — AB (ref 3.5–5.2)
Sodium: 141 mmol/L (ref 134–144)
Total Protein: 6.7 g/dL (ref 6.0–8.5)

## 2017-07-21 LAB — LIPID PANEL
CHOL/HDL RATIO: 4.1 ratio (ref 0.0–4.4)
CHOLESTEROL TOTAL: 234 mg/dL — AB (ref 100–199)
HDL: 57 mg/dL (ref >39)
LDL CALC: 152 mg/dL — AB (ref 0–99)
Triglycerides: 124 mg/dL (ref 0–149)
VLDL CHOLESTEROL CAL: 25 mg/dL (ref 5–40)

## 2017-07-21 LAB — HEMOGLOBIN A1C
Est. average glucose Bld gHb Est-mCnc: 117 mg/dL
HEMOGLOBIN A1C: 5.7 % — AB (ref 4.8–5.6)

## 2017-07-21 LAB — INSULIN, RANDOM: INSULIN: 9.7 u[IU]/mL (ref 2.6–24.9)

## 2017-07-22 DIAGNOSIS — G4733 Obstructive sleep apnea (adult) (pediatric): Secondary | ICD-10-CM | POA: Diagnosis not present

## 2017-07-31 ENCOUNTER — Encounter: Payer: Self-pay | Admitting: Family Medicine

## 2017-07-31 ENCOUNTER — Ambulatory Visit (INDEPENDENT_AMBULATORY_CARE_PROVIDER_SITE_OTHER): Payer: Medicare HMO | Admitting: Family Medicine

## 2017-07-31 VITALS — BP 102/62 | HR 69 | Temp 98.1°F | Resp 18 | Wt 166.8 lb

## 2017-07-31 DIAGNOSIS — E785 Hyperlipidemia, unspecified: Secondary | ICD-10-CM

## 2017-07-31 DIAGNOSIS — E7849 Other hyperlipidemia: Secondary | ICD-10-CM

## 2017-07-31 DIAGNOSIS — E1169 Type 2 diabetes mellitus with other specified complication: Secondary | ICD-10-CM | POA: Diagnosis not present

## 2017-07-31 DIAGNOSIS — M722 Plantar fascial fibromatosis: Secondary | ICD-10-CM

## 2017-07-31 DIAGNOSIS — M8580 Other specified disorders of bone density and structure, unspecified site: Secondary | ICD-10-CM | POA: Diagnosis not present

## 2017-07-31 DIAGNOSIS — Z0001 Encounter for general adult medical examination with abnormal findings: Secondary | ICD-10-CM

## 2017-07-31 DIAGNOSIS — E559 Vitamin D deficiency, unspecified: Secondary | ICD-10-CM

## 2017-07-31 DIAGNOSIS — E875 Hyperkalemia: Secondary | ICD-10-CM | POA: Diagnosis not present

## 2017-07-31 DIAGNOSIS — Z0000 Encounter for general adult medical examination without abnormal findings: Secondary | ICD-10-CM

## 2017-07-31 DIAGNOSIS — F339 Major depressive disorder, recurrent, unspecified: Secondary | ICD-10-CM

## 2017-07-31 LAB — COMPREHENSIVE METABOLIC PANEL
ALK PHOS: 67 U/L (ref 39–117)
ALT: 14 U/L (ref 0–35)
AST: 16 U/L (ref 0–37)
Albumin: 3.8 g/dL (ref 3.5–5.2)
BILIRUBIN TOTAL: 0.4 mg/dL (ref 0.2–1.2)
BUN: 26 mg/dL — ABNORMAL HIGH (ref 6–23)
CO2: 29 meq/L (ref 19–32)
Calcium: 9.7 mg/dL (ref 8.4–10.5)
Chloride: 104 mEq/L (ref 96–112)
Creatinine, Ser: 0.67 mg/dL (ref 0.40–1.20)
GFR: 92.47 mL/min (ref >60.00)
Glucose, Bld: 81 mg/dL (ref 70–99)
POTASSIUM: 4 meq/L (ref 3.5–5.1)
Sodium: 139 mEq/L (ref 135–145)
TOTAL PROTEIN: 6.9 g/dL (ref 6.0–8.3)

## 2017-07-31 NOTE — Assessment & Plan Note (Signed)
Maintain heart healthy diet, increase exercise, avoid trans fats, consider a krill oil cap daily. Is taking Pravastatin 1/2 tab now and numbers are better.

## 2017-07-31 NOTE — Assessment & Plan Note (Signed)
Doing well 

## 2017-07-31 NOTE — Assessment & Plan Note (Signed)
hgba1c acceptable, minimize simple carbs. Increase exercise as tolerated.  

## 2017-07-31 NOTE — Progress Notes (Signed)
Subjective:  I acted as a Education administrator for Dr. Charlett Blake. Cindy [name], Cindy [name]  Patient ID: Cindy [name], female    DOB: Jul 30, 1945, 72 y.o.   MRN: [id number]  No chief complaint on file.   HPI  Patient is in today for annual exam and follow up on chronic medical concerns including hyperlipidemia, vitamin D deficiency, diabetes and allergies. She is noting her greatest physical complaint is heel pain. It is daily and worse upons first arising. No acute injury or trauma. No recent febrile illness or hospitalizations. Is trying to eat well and stays active. Does well with activities of dailyliving at home. Denies CP/palp/SOB/HA/congestion/fevers/GI or GU c/o. Taking meds as prescribed  Patient Care Team: Mosie Lukes, MD as PCP - General (Family Medicine) Regina Eck, CNM as Referring Physician (Certified Nurse Midwife) Deneise Lever, MD as Consulting Physician (Pulmonary Disease) Jaclyn Prime, [place] as Referring Physician (Chiropractic Medicine) Marica Otter, [place] (Optometry) Crista Luria, MD as Consulting Physician (Dermatology) Elam Dutch, MD as Consulting Physician (Vascular Surgery) Monna Fam, MD as Consulting Physician (Ophthalmology)   Past Medical History:  Diagnosis Date  . Abnormal liver function tests 07/19/2015  . Allergic state 02/01/2010   Qualifier: Diagnosis of  By: Regis Bill MD, Standley Brooking   . Allergy   . Anxiety   . Back pain   . Chicken pox 72 yrs old  . Depressive disorder, not elsewhere classified   . Diabetes ([place])    diet controlled- no medicines   . Fibroid   . History of fractured kneecap    right   . Hyperglycemia   . Hyperlipidemia   . Infertility, female   . Joint pain   . Knee pain, left 10/05/2011   Follows with Dr Margarette Canada  . Measles as a child  . Medicare annual wellness visit, subsequent 07/06/2014  . OA (osteoarthritis) of knee    left knee  . Obesity 10/05/2011  . Obesity 10/05/2011  . Osteoarthritis   . Osteopenia 06/26/2014  . Other  and unspecified hyperlipidemia   . Overweight 10/05/2011  . Overweight(278.02) 10/05/2011  . Sleep apnea     On CPAP  . Sleep apnea   . Stye 11/19/2012  . Syncope 01/26/2017  . Thyroid disease    h/o hyperthyroid treated with radioactive iodine? from 16 to 20  . Type 2 diabetes mellitus with hyperlipidemia ([place])   . Varicose vein of leg     Past Surgical History:  Procedure Laterality Date  . BACK SURGERY     L5 discectomy, 2002. helpful  . CERVICAL BIOPSY  W/ LOOP ELECTRODE EXCISION    . CESAREAN SECTION  1987  . COLONOSCOPY    . CPAP    . KNEE SURGERY Right 2006   R knee fx. patella  . LEEP    . stitches in foot Right     Family History  Problem Relation Age of Onset  . Hyperlipidemia Mother   . Dementia Mother   . Obesity Mother   . Hyperlipidemia Father   . Heart disease Father        bypass, CAD  . Sleep apnea Father   . Hyperlipidemia Sister   . Diabetes Sister        type 2  . Colon polyps Sister   . Dementia Maternal Grandmother        alzheimer  . Alcohol abuse Maternal Grandfather   . Diabetes Paternal Grandmother        type 2?  Marland Kitchen  Colon cancer Neg Hx   . Esophageal cancer Neg Hx   . Rectal cancer Neg Hx   . Stomach cancer Neg Hx     Social History   Socioeconomic History  . Marital status: Divorced    Spouse name: Not on file  . Number of children: 1  . Years of education: Not on file  . Highest education level: Not on file  Occupational History  . Occupation: licensed Development worker, international aid: [employer]  . Financial resource strain: Not on file  . Food insecurity:    Worry: Not on file    Inability: Not on file  . Transportation needs:    Medical: Not on file    Non-medical: Not on file  Tobacco Use  . Smoking status: Never Smoker  . Smokeless tobacco: Never Used  Substance and Sexual Activity  . Alcohol use: Yes    Alcohol/week: 0.6 oz    Types: 1 Glasses of wine per week  . Drug use:  No  . Sexual activity: Never    Partners: Male    Birth control/protection: Post-menopausal  Lifestyle  . Physical activity:    Days per week: Not on file    Minutes per session: Not on file  . Stress: Not on file  Relationships  . Social connections:    Talks on phone: Not on file    Gets together: Not on file    Attends religious service: Not on file    Active member of club or organization: Not on file    Attends meetings of clubs or organizations: Not on file    Relationship status: Not on file  . Intimate partner violence:    Fear of current or ex partner: Not on file    Emotionally abused: Not on file    Physically abused: Not on file    Forced sexual activity: Not on file  Other Topics Concern  . Not on file  Social History Narrative   Recently divorced separated fall 2012    Now living in [place] area .   Never smoker   Regular exercise   Sleep 01/12/29 up at 6          Outpatient Medications Prior to Visit  Medication Sig Dispense Refill  . aspirin 81 MG tablet Take 81 mg by mouth daily.    . cholecalciferol (VITAMIN D) 1000 units tablet Take 2,000 Units by mouth daily.     . cycloSPORINE (RESTASIS) 0.05 % ophthalmic emulsion Place 1 drop into both eyes daily as needed. Reported on 07/09/2015    . DULoxetine (CYMBALTA) 30 MG capsule TAKE ONE CAPSULE BY MOUTH DAILY (Patient taking differently: TAKE ONE CAPSULE EVERY OTHER DAY) 30 capsule 0  . fluticasone (FLONASE) 50 MCG/ACT nasal spray Place 1 spray into both nostrils as needed.     Marland Kitchen levocetirizine (XYZAL) 5 MG tablet Take 5 mg by mouth as needed.   3  . metFORMIN (GLUCOPHAGE) 500 MG tablet Take 1 tablet (500 mg total) by mouth 2 (two) times daily with a meal. 60 tablet 0  . pravastatin (PRAVACHOL) 80 MG tablet TAKE ONE-HALF (1/2) TABLET BY MOUTH DAILY 45 tablet 0  . Turmeric 500 MG CAPS Take by mouth.     No facility-administered medications prior to visit.     Allergies  Allergen Reactions  . Codeine  Other (See Comments)    hallucinations  . Crestor [Rosuvastatin]  REACTION: myalgias  . Other Other (See Comments)    NOVACAINE- blisters left side of mouth after receiving   . Amoxicillin Rash  . Erythromycin Other (See Comments)    cramps  . Sulfa Antibiotics Rash  . Sulfonamide Derivatives Rash    Review of Systems  Constitutional: Negative for chills, fever and malaise/fatigue.  HENT: Negative for congestion and hearing loss.   Eyes: Negative for discharge.  Respiratory: Negative for cough, sputum production and shortness of breath.   Cardiovascular: Negative for chest pain, palpitations and leg swelling.  Gastrointestinal: Negative for abdominal pain, blood in stool, constipation, diarrhea, heartburn, nausea and vomiting.  Genitourinary: Negative for dysuria, frequency, hematuria and urgency.  Musculoskeletal: Positive for joint pain. Negative for back pain, falls and myalgias.  Skin: Negative for rash.  Neurological: Negative for dizziness, sensory change, loss of consciousness, weakness and headaches.  Endo/Heme/Allergies: Negative for environmental allergies. Does not bruise/bleed easily.  Psychiatric/Behavioral: Negative for depression and suicidal ideas. The patient is not nervous/anxious and does not have insomnia.        Objective:    Physical Exam  Constitutional: She is oriented to person, place, and time. She appears well-developed and well-nourished. No distress.  HENT:  Head: Normocephalic and atraumatic.  Eyes: Conjunctivae are normal.  Neck: Neck supple. No thyromegaly present.  Cardiovascular: Normal rate, regular rhythm and normal heart sounds.  No murmur heard. Pulmonary/Chest: Effort normal and breath sounds normal. No respiratory distress.  Abdominal: Soft. Bowel sounds are normal. She exhibits no distension and no mass. There is no tenderness.  Musculoskeletal: She exhibits no edema.  Lymphadenopathy:    She has no cervical adenopathy.    Neurological: She is alert and oriented to person, place, and time.  Skin: Skin is warm and dry.  Psychiatric: She has a normal mood and affect. Her behavior is normal.    BP 102/62 (BP Location: Left Arm, Patient Position: Sitting, Cuff Size: Normal)   Pulse 69   Temp 98.1 F (36.7 C) (Oral)   Resp 18   Wt 166 lb 12.8 oz (75.7 kg)   LMP 04/04/2000   SpO2 98%   BMI 27.76 kg/m  Wt Readings from Last 3 Encounters:  07/31/17 166 lb 12.8 oz (75.7 kg)  07/20/17 161 lb (73 kg)  07/05/17 162 lb (73.5 kg)   BP Readings from Last 3 Encounters:  07/31/17 102/62  07/20/17 111/62  07/05/17 115/61     Immunization History  Administered Date(s) Administered  . Influenza Split 06/02/2012, 12/30/2014  . Influenza Whole 03/19/2007  . Influenza, High Dose Seasonal PF 01/26/2017  . Influenza,inj,Quad PF,6+ Mos 01/02/2014, 12/08/2015  . Influenza-Unspecified 12/03/2012  . Pneumococcal Conjugate-13 01/09/2015  . Pneumococcal Polysaccharide-23 07/25/2016  . Td 11/08/2005  . Tdap 06/02/2012  . Zoster 10/05/2011    Health Maintenance  Topic Date Due  . FOOT EXAM  06/26/2015  . URINE MICROALBUMIN  01/09/2016  . OPHTHALMOLOGY EXAM  09/15/2016  . INFLUENZA VACCINE  11/02/2017  . HEMOGLOBIN A1C  01/19/2018  . MAMMOGRAM  04/10/2018  . TETANUS/TDAP  06/03/2022  . COLONOSCOPY  04/18/2026  . DEXA SCAN  Completed  . Hepatitis C Screening  Completed  . PNA vac Low Risk Adult  Completed    Lab Results  Component Value Date   WBC 6.2 01/10/2017   HGB 13.2 01/10/2017   HCT 41.3 01/10/2017   PLT 308.0 08/05/2016   GLUCOSE 97 07/20/2017   CHOL 234 (H) 07/20/2017   TRIG 124 07/20/2017  HDL 57 07/20/2017   LDLDIRECT 145.4 11/14/2012   LDLCALC 152 (H) 07/20/2017   ALT 11 07/20/2017   AST 14 07/20/2017   NA 141 07/20/2017   K 5.3 (H) 07/20/2017   CL 105 07/20/2017   CREATININE 0.82 07/20/2017   BUN 29 (H) 07/20/2017   CO2 25 07/20/2017   TSH 1.020 01/10/2017   HGBA1C 5.7 (H)  07/20/2017   MICROALBUR <0.7 01/09/2015    Lab Results  Component Value Date   TSH 1.020 01/10/2017   Lab Results  Component Value Date   WBC 6.2 01/10/2017   HGB 13.2 01/10/2017   HCT 41.3 01/10/2017   MCV 92 01/10/2017   PLT 308.0 08/05/2016   Lab Results  Component Value Date   NA 141 07/20/2017   K 5.3 (H) 07/20/2017   CO2 25 07/20/2017   GLUCOSE 97 07/20/2017   BUN 29 (H) 07/20/2017   CREATININE 0.82 07/20/2017   BILITOT <0.2 07/20/2017   ALKPHOS 72 07/20/2017   AST 14 07/20/2017   ALT 11 07/20/2017   PROT 6.7 07/20/2017   ALBUMIN 3.9 07/20/2017   CALCIUM 10.1 07/20/2017   GFR 69.53 08/05/2016   Lab Results  Component Value Date   CHOL 234 (H) 07/20/2017   Lab Results  Component Value Date   HDL 57 07/20/2017   Lab Results  Component Value Date   LDLCALC 152 (H) 07/20/2017   Lab Results  Component Value Date   TRIG 124 07/20/2017   Lab Results  Component Value Date   CHOLHDL 4.1 07/20/2017   Lab Results  Component Value Date   HGBA1C 5.7 (H) 07/20/2017         Assessment & Plan:   Problem List Items Addressed This Visit    Depression, recurrent ([place])    Doing well.       Preventative health care    Patient encouraged to maintain heart healthy diet, regular exercise, adequate sleep. Consider daily probiotics. Take medications as prescribed      Type 2 diabetes mellitus with hyperlipidemia ([facility])    hgba1c acceptable, minimize simple carbs. Increase exercise as tolerated.       Vitamin D deficiency    WNL now.       Other hyperlipidemia    Maintain heart healthy diet, increase exercise, avoid trans fats, consider a krill oil cap daily. Is taking Pravastatin 1/2 tab now and numbers are better.      Prediabetes    hgba1c acceptable, minimize simple carbs. Increase exercise as tolerated.      Plantar fasciitis, bilateral    Good, shoes, ice stretch and consider topical Lidocaine, consider podiatry referral       Other Visit  Diagnoses    Hyperkalemia    -  Primary   Relevant Orders   Comprehensive metabolic panel   Osteopenia, unspecified location       Relevant Orders   DG Bone Density      I am having Cindy [name] maintain her cycloSPORINE, levocetirizine, fluticasone, cholecalciferol, Turmeric, aspirin, DULoxetine, pravastatin, and metFORMIN.  No orders of the defined types were placed in this encounter.   CMA served as Education administrator during this visit. History, Physical and Plan performed by medical provider. Documentation and orders reviewed and attested to.  Penni Homans, MD

## 2017-07-31 NOTE — Patient Instructions (Signed)
Lidocaine gel to foot and increase stretching Preventive Care 70 Years and Older, Female Preventive care refers to lifestyle choices and visits with your health care provider that can promote health and wellness. What does preventive care include?  A yearly physical exam. This is also called an annual well check.  Dental exams once or twice a year.  Routine eye exams. Ask your health care provider how often you should have your eyes checked.  Personal lifestyle choices, including: ? Daily care of your teeth and gums. ? Regular physical activity. ? Eating a healthy diet. ? Avoiding tobacco and drug use. ? Limiting alcohol use. ? Practicing safe sex. ? Taking low-dose aspirin every day. ? Taking vitamin and mineral supplements as recommended by your health care provider. What happens during an annual well check? The services and screenings done by your health care provider during your annual well check will depend on your age, overall health, lifestyle risk factors, and family history of disease. Counseling Your health care provider may ask you questions about your:  Alcohol use.  Tobacco use.  Drug use.  Emotional well-being.  Home and relationship well-being.  Sexual activity.  Eating habits.  History of falls.  Memory and ability to understand (cognition).  Work and work Statistician.  Reproductive health.  Screening You may have the following tests or measurements:  Height, weight, and BMI.  Blood pressure.  Lipid and cholesterol levels. These may be checked every 5 years, or more frequently if you are over 72 years old.  Skin check.  Lung cancer screening. You may have this screening every year starting at age 3 if you have a 30-pack-year history of smoking and currently smoke or have quit within the past 15 years.  Fecal occult blood test (FOBT) of the stool. You may have this test every year starting at age 82.  Flexible sigmoidoscopy or colonoscopy.  You may have a sigmoidoscopy every 5 years or a colonoscopy every 10 years starting at age 77.  Hepatitis C blood test.  Hepatitis B blood test.  Sexually transmitted disease (STD) testing.  Diabetes screening. This is done by checking your blood sugar (glucose) after you have not eaten for a while (fasting). You may have this done every 1-3 years.  Bone density scan. This is done to screen for osteoporosis. You may have this done starting at age 80.  Mammogram. This may be done every 1-2 years. Talk to your health care provider about how often you should have regular mammograms.  Talk with your health care provider about your test results, treatment options, and if necessary, the need for more tests. Vaccines Your health care provider may recommend certain vaccines, such as:  Influenza vaccine. This is recommended every year.  Tetanus, diphtheria, and acellular pertussis (Tdap, Td) vaccine. You may need a Td booster every 10 years.  Varicella vaccine. You may need this if you have not been vaccinated.  Zoster vaccine. You may need this after age 63.  Measles, mumps, and rubella (MMR) vaccine. You may need at least one dose of MMR if you were born in 1957 or later. You may also need a second dose.  Pneumococcal 13-valent conjugate (PCV13) vaccine. One dose is recommended after age 42.  Pneumococcal polysaccharide (PPSV23) vaccine. One dose is recommended after age 100.  Meningococcal vaccine. You may need this if you have certain conditions.  Hepatitis A vaccine. You may need this if you have certain conditions or if you travel or work in places  where you may be exposed to hepatitis A.  Hepatitis B vaccine. You may need this if you have certain conditions or if you travel or work in places where you may be exposed to hepatitis B.  Haemophilus influenzae type b (Hib) vaccine. You may need this if you have certain conditions.  Talk to your health care provider about which  screenings and vaccines you need and how often you need them. This information is not intended to replace advice given to you by your health care provider. Make sure you discuss any questions you have with your health care provider. Document Released: 04/17/2015 Document Revised: 12/09/2015 Document Reviewed: 01/20/2015 Elsevier Interactive Patient Education  [employer].

## 2017-07-31 NOTE — Assessment & Plan Note (Signed)
Good, shoes, ice stretch and consider topical Lidocaine, consider podiatry referral

## 2017-07-31 NOTE — Assessment & Plan Note (Addendum)
Patient encouraged to maintain heart healthy diet, regular exercise, adequate sleep. Consider daily probiotics. Take medications as prescribed. Dexa scan ordered, MGM, pap and colonoscopy all utd

## 2017-07-31 NOTE — Assessment & Plan Note (Signed)
WNL now

## 2017-08-10 ENCOUNTER — Ambulatory Visit (INDEPENDENT_AMBULATORY_CARE_PROVIDER_SITE_OTHER): Payer: Medicare HMO | Admitting: Family Medicine

## 2017-08-10 VITALS — BP 133/64 | HR 68 | Temp 98.3°F | Ht 65.0 in | Wt 166.0 lb

## 2017-08-10 DIAGNOSIS — E669 Obesity, unspecified: Secondary | ICD-10-CM

## 2017-08-10 DIAGNOSIS — Z6830 Body mass index (BMI) 30.0-30.9, adult: Secondary | ICD-10-CM | POA: Diagnosis not present

## 2017-08-10 DIAGNOSIS — R7303 Prediabetes: Secondary | ICD-10-CM | POA: Diagnosis not present

## 2017-08-10 MED ORDER — METFORMIN HCL 500 MG PO TABS: 500.0000 mg | ORAL_TABLET | Freq: Two times a day (BID) | ORAL | 0 refills | Status: DC

## 2017-08-10 NOTE — Progress Notes (Signed)
Office: [phone]  /  Fax: [phone]   HPI:   Chief Complaint: OBESITY Cindy [name] is here to discuss her progress with her obesity treatment plan. She is on the Pescatarian eating plan and is following her eating plan approximately 10-20 % of the time. She states she is doing zumba for 45 minutes 1 times per week. Cindy [name] has been traveling and in situations where she wasn't in control of her food options. She is going to be traveling more next week then back home for a while.  Her weight is 166 lb (75.3 kg) today and has gained 5 pounds since her last visit. She has lost 19 lbs since starting treatment with [name].  Cindy [name] has a diagnosis of Cindy based on her elevated Hgb A1c and was informed this puts her at greater risk of developing diabetes. She continues to do well with metformin and she denies nausea, vomiting, or hypoglycemia. A1c improved. She continues to work on diet and exercise to decrease risk of diabetes.   ALLERGIES: Allergies  Allergen Reactions  . Codeine Other (See Comments)    hallucinations  . Crestor [Rosuvastatin]     REACTION: myalgias  . Other Other (See Comments)    NOVACAINE- blisters left side of mouth after receiving   . Amoxicillin Rash  . Erythromycin Other (See Comments)    cramps  . Sulfa Antibiotics Rash  . Sulfonamide Derivatives Rash    MEDICATIONS: Current Outpatient Medications on File Prior to Visit  Medication Sig Dispense Refill  . aspirin 81 MG tablet Take 81 mg by mouth daily.    . cholecalciferol (VITAMIN D) 1000 units tablet Take 2,000 Units by mouth daily.     . cycloSPORINE (RESTASIS) 0.05 % ophthalmic emulsion Place 1 drop into both eyes daily as needed. Reported on 07/09/2015    . DULoxetine (CYMBALTA) 30 MG capsule TAKE ONE CAPSULE BY MOUTH DAILY (Patient taking differently: TAKE ONE CAPSULE EVERY OTHER DAY) 30 capsule 0  . fluticasone (FLONASE) 50 MCG/ACT nasal spray Place 1 spray into both nostrils as needed.      Marland Kitchen levocetirizine (XYZAL) 5 MG tablet Take 5 mg by mouth as needed.   3  . pravastatin (PRAVACHOL) 80 MG tablet TAKE ONE-HALF (1/2) TABLET BY MOUTH DAILY 45 tablet 0  . Turmeric 500 MG CAPS Take by mouth.     No current facility-administered medications on file prior to visit.     PAST MEDICAL HISTORY: Past Medical History:  Diagnosis Date  . Abnormal liver function tests 07/19/2015  . Allergic state 02/01/2010   Qualifier: Diagnosis of  By: Regis Bill MD, Standley Brooking   . Allergy   . Anxiety   . Back pain   . Chicken pox 72 yrs old  . Depressive disorder, not elsewhere classified   . Diabetes ([place])    diet controlled- no medicines   . Fibroid   . History of fractured kneecap    right   . Hyperglycemia   . Hyperlipidemia   . Infertility, female   . Joint pain   . Knee pain, left 10/05/2011   Follows with Dr Margarette Canada  . Measles as a child  . Medicare annual wellness visit, subsequent 07/06/2014  . OA (osteoarthritis) of knee    left knee  . Obesity 10/05/2011  . Obesity 10/05/2011  . Osteoarthritis   . Osteopenia 06/26/2014  . Other and unspecified hyperlipidemia   . Overweight 10/05/2011  . Overweight(278.02) 10/05/2011  . Sleep apnea  On CPAP  . Sleep apnea   . Stye 11/19/2012  . Syncope 01/26/2017  . Thyroid disease    h/o hyperthyroid treated with radioactive iodine? from 16 to 20  . Type 2 diabetes mellitus with hyperlipidemia ([place])   . Varicose vein of leg     PAST SURGICAL HISTORY: Past Surgical History:  Procedure Laterality Date  . BACK SURGERY     L5 discectomy, 2002. helpful  . CERVICAL BIOPSY  W/ LOOP ELECTRODE EXCISION    . CESAREAN SECTION  1987  . COLONOSCOPY    . CPAP    . KNEE SURGERY Right 2006   R knee fx. patella  . LEEP    . stitches in foot Right     SOCIAL HISTORY: Social History   Tobacco Use  . Smoking status: Never Smoker  . Smokeless tobacco: Never Used  Substance Use Topics  . Alcohol use: Yes    Alcohol/week: 0.6 oz    Types: 1 Glasses  of wine per week  . Drug use: No    FAMILY HISTORY: Family History  Problem Relation Age of Onset  . Hyperlipidemia Mother   . Dementia Mother   . Obesity Mother   . Hyperlipidemia Father   . Heart disease Father        bypass, CAD  . Sleep apnea Father   . Hyperlipidemia Sister   . Diabetes Sister        type 2  . Colon polyps Sister   . Dementia Maternal Grandmother        alzheimer  . Alcohol abuse Maternal Grandfather   . Diabetes Paternal Grandmother        type 2?  . Colon cancer Neg Hx   . Esophageal cancer Neg Hx   . Rectal cancer Neg Hx   . Stomach cancer Neg Hx     ROS: Review of Systems  Constitutional: Negative for weight loss.  Gastrointestinal: Negative for nausea and vomiting.  Endo/Heme/Allergies:       Negative hypoglycemia    PHYSICAL EXAM: Blood pressure 133/64, pulse 68, temperature 98.3 F (36.8 C), temperature source Oral, height 5\' 5"  (1.651 m), weight 166 lb (75.3 kg), last menstrual period 04/04/2000, SpO2 98 %. Body mass index is 27.62 kg/m. Physical Exam  Constitutional: She is oriented to person, place, and time. She appears well-developed and well-nourished.  Cardiovascular: Normal rate.  Pulmonary/Chest: Effort normal.  Musculoskeletal: Normal range of motion.  Neurological: She is oriented to person, place, and time.  Skin: Skin is warm and dry.  Psychiatric: She has a normal mood and affect. Her behavior is normal.  Vitals reviewed.   RECENT LABS AND TESTS: BMET    Component Value Date/Time   NA 139 07/31/2017 1411   NA 141 07/20/2017 1229   K 4.0 07/31/2017 1411   CL 104 07/31/2017 1411   CO2 29 07/31/2017 1411   GLUCOSE 81 07/31/2017 1411   BUN 26 (H) 07/31/2017 1411   BUN 29 (H) 07/20/2017 1229   CREATININE 0.67 07/31/2017 1411   CALCIUM 9.7 07/31/2017 1411   GFRNONAA 73 07/20/2017 1229   GFRAA 84 07/20/2017 1229   Lab Results  Component Value Date   HGBA1C 5.7 (H) 07/20/2017   HGBA1C 5.8 (H) 01/10/2017    HGBA1C 6.1 08/05/2016   HGBA1C 5.9 07/06/2015   HGBA1C 5.6 01/02/2015   Lab Results  Component Value Date   INSULIN 9.7 07/20/2017   INSULIN 11.7 01/10/2017   CBC  Component Value Date/Time   WBC 6.2 01/10/2017 0938   WBC 7.4 08/05/2016 0956   RBC 4.51 01/10/2017 0938   RBC 4.56 08/05/2016 0956   HGB 13.2 01/10/2017 0938   HCT 41.3 01/10/2017 0938   PLT 308.0 08/05/2016 0956   MCV 92 01/10/2017 0938   MCH 29.3 01/10/2017 0938   MCHC 32.0 01/10/2017 0938   MCHC 33.6 08/05/2016 0956   RDW 14.0 01/10/2017 0938   LYMPHSABS 2.1 01/10/2017 0938   MONOABS 0.7 08/05/2016 0956   EOSABS 0.2 01/10/2017 0938   BASOSABS 0.1 01/10/2017 0938   Iron/TIBC/Ferritin/ %Sat No results found for: IRON, TIBC, FERRITIN, IRONPCTSAT Lipid Panel     Component Value Date/Time   CHOL 234 (H) 07/20/2017 1229   TRIG 124 07/20/2017 1229   TRIG 94 04/07/2006 1019   HDL 57 07/20/2017 1229   CHOLHDL 4.1 07/20/2017 1229   CHOLHDL 4 08/05/2016 0956   VLDL 20.0 08/05/2016 0956   LDLCALC 152 (H) 07/20/2017 1229   LDLDIRECT 145.4 11/14/2012 0823   Hepatic Function Panel     Component Value Date/Time   PROT 6.9 07/31/2017 1411   PROT 6.7 07/20/2017 1229   ALBUMIN 3.8 07/31/2017 1411   ALBUMIN 3.9 07/20/2017 1229   AST 16 07/31/2017 1411   ALT 14 07/31/2017 1411   ALKPHOS 67 07/31/2017 1411   BILITOT 0.4 07/31/2017 1411   BILITOT <0.2 07/20/2017 1229   BILIDIR 0.1 10/29/2013 0831      Component Value Date/Time   TSH 1.020 01/10/2017 0938   TSH 1.21 08/05/2016 0956   TSH 1.56 07/06/2015 0936    ASSESSMENT AND PLAN: Prediabetes - Plan: metFORMIN (GLUCOPHAGE) 500 MG tablet  Class 1 obesity with serious comorbidity and body mass index (BMI) of 30.0 to 30.9 in adult, unspecified obesity type - BMI greater than 30 at start of program   PLAN:  Cindy Cindy [name] will continue to work on weight loss, exercise, and decreasing simple carbohydrates in her diet to help decrease the risk of  diabetes. We dicussed metformin including benefits and risks. She was informed that eating too many simple carbohydrates or too many calories at one sitting increases the likelihood of GI side effects. Cindy [name] agrees to continue taking metformin 500 mg BID #60 and we will refill for 1 month. Cindy [name] agrees to follow up with our clinic in 2 to 3 weeks as directed to monitor her progress.  Obesity Cindy [name] is currently in the action stage of change. As such, her goal is to continue with weight loss efforts She has agreed to portion control better and make smarter food choices, such as increase vegetables and decrease simple carbohydrates  Cindy [name] has been instructed to work up to a goal of 150 minutes of combined cardio and strengthening exercise per week for weight loss and overall health benefits. We discussed the following Behavioral Modification Strategies today: increasing lean protein intake, work on meal planning and easy cooking plans, dealing with family or coworker sabotage, and travel eating strategies    Cindy [name] has agreed to follow up with our clinic in 2 to 3 weeks. She was informed of the importance of frequent follow up visits to maximize her success with intensive lifestyle modifications for her multiple health conditions.   OBESITY BEHAVIORAL INTERVENTION VISIT  Today's visit was # 14 out of 22.  Starting weight: 185 lbs Starting date: 01/10/17 Today's weight : 166 lbs Today's date: 08/10/2017 Total lbs lost to date: 27 (Patients must lose 7 lbs in the first  6 months to continue with counseling)   ASK: We discussed the diagnosis of obesity with Cindy [name] today and Cindy [name] agreed to give [name] permission to discuss obesity behavioral modification therapy today.  ASSESS: Cindy [name] has the diagnosis of obesity and her BMI today is 27.62 Cindy [name] is in the action stage of change   ADVISE: Cindy [name] was educated on the multiple health risks of obesity as well as the benefit of weight loss to  improve her health. She was advised of the need for long term treatment and the importance of lifestyle modifications.  AGREE: Multiple dietary modification options and treatment options were discussed and  Cindy [name] agreed to the above obesity treatment plan.  I, Trixie Dredge, am acting as transcriptionist for Dennard Nip, MD  I have reviewed the above documentation for accuracy and completeness, and I agree with the above. -Dennard Nip, MD

## 2017-08-17 ENCOUNTER — Other Ambulatory Visit (INDEPENDENT_AMBULATORY_CARE_PROVIDER_SITE_OTHER): Payer: Self-pay | Admitting: Family Medicine

## 2017-08-17 DIAGNOSIS — R7303 Prediabetes: Secondary | ICD-10-CM

## 2017-08-21 DIAGNOSIS — J40 Bronchitis, not specified as acute or chronic: Secondary | ICD-10-CM | POA: Diagnosis not present

## 2017-08-21 DIAGNOSIS — G4733 Obstructive sleep apnea (adult) (pediatric): Secondary | ICD-10-CM | POA: Diagnosis not present

## 2017-08-21 DIAGNOSIS — E539 Vitamin B deficiency, unspecified: Secondary | ICD-10-CM | POA: Diagnosis not present

## 2017-08-21 DIAGNOSIS — B9689 Other specified bacterial agents as the cause of diseases classified elsewhere: Secondary | ICD-10-CM | POA: Diagnosis not present

## 2017-08-21 DIAGNOSIS — J019 Acute sinusitis, unspecified: Secondary | ICD-10-CM | POA: Diagnosis not present

## 2017-08-22 DIAGNOSIS — G4733 Obstructive sleep apnea (adult) (pediatric): Secondary | ICD-10-CM | POA: Diagnosis not present

## 2017-08-30 ENCOUNTER — Encounter (INDEPENDENT_AMBULATORY_CARE_PROVIDER_SITE_OTHER): Payer: Self-pay

## 2017-08-30 ENCOUNTER — Ambulatory Visit (INDEPENDENT_AMBULATORY_CARE_PROVIDER_SITE_OTHER): Payer: Medicare HMO | Admitting: Family Medicine

## 2017-08-31 ENCOUNTER — Ambulatory Visit (INDEPENDENT_AMBULATORY_CARE_PROVIDER_SITE_OTHER): Payer: Medicare HMO | Admitting: Family Medicine

## 2017-08-31 VITALS — BP 114/58 | HR 59 | Temp 97.5°F | Ht 65.0 in | Wt 161.0 lb

## 2017-08-31 DIAGNOSIS — E669 Obesity, unspecified: Secondary | ICD-10-CM | POA: Diagnosis not present

## 2017-08-31 DIAGNOSIS — Z6830 Body mass index (BMI) 30.0-30.9, adult: Secondary | ICD-10-CM

## 2017-08-31 DIAGNOSIS — R7303 Prediabetes: Secondary | ICD-10-CM | POA: Diagnosis not present

## 2017-08-31 NOTE — Progress Notes (Signed)
Office: [phone]  /  Fax: [phone]   HPI:   Chief Complaint: OBESITY Cindy [name] is here to discuss her progress with her obesity treatment plan. She is on the portion control better and make smarter food choices, such as increase vegetables and decrease simple carbohydrates and is following her eating plan approximately 25 % of the time. She states she is exercising 0 minutes 0 times per week. Cindy [name] has done well with weight loss but this was in part due to recent upper respiratory infection and difficulty swallowing. She is now back in town for a while and ready to get back on track.  Her weight is 161 lb (73 kg) today and has had a weight loss of 5 pounds over a period of 3 weeks since her last visit. She has lost 24 lbs since starting treatment with [name].  Pre-Diabetes Cindy [name] has a diagnosis of pre-diabetes based on her elevated Hgb A1c and was informed this puts her at greater risk of developing diabetes. She is on metformin but still forgetting 2nd dose. She notes polyphagia. She continues to work on diet and exercise to decrease risk of diabetes. She denies nausea or hypoglycemia.  ALLERGIES: Allergies  Allergen Reactions  . Codeine Other (See Comments)    hallucinations  . Crestor [Rosuvastatin]     REACTION: myalgias  . Other Other (See Comments)    NOVACAINE- blisters left side of mouth after receiving   . Amoxicillin Rash  . Erythromycin Other (See Comments)    cramps  . Sulfa Antibiotics Rash  . Sulfonamide Derivatives Rash    MEDICATIONS: Current Outpatient Medications on File Prior to Visit  Medication Sig Dispense Refill  . aspirin 81 MG tablet Take 81 mg by mouth daily.    . cholecalciferol (VITAMIN D) 1000 units tablet Take 2,000 Units by mouth daily.     . cycloSPORINE (RESTASIS) 0.05 % ophthalmic emulsion Place 1 drop into both eyes daily as needed. Reported on 07/09/2015    . DULoxetine (CYMBALTA) 30 MG capsule TAKE ONE CAPSULE BY MOUTH DAILY (Patient taking  differently: TAKE ONE CAPSULE EVERY OTHER DAY) 30 capsule 0  . fluticasone (FLONASE) 50 MCG/ACT nasal spray Place 1 spray into both nostrils as needed.     Marland Kitchen levocetirizine (XYZAL) 5 MG tablet Take 5 mg by mouth as needed.   3  . metFORMIN (GLUCOPHAGE) 500 MG tablet Take 1 tablet (500 mg total) by mouth 2 (two) times daily with a meal. 60 tablet 0  . pravastatin (PRAVACHOL) 80 MG tablet TAKE ONE-HALF (1/2) TABLET BY MOUTH DAILY 45 tablet 0  . Turmeric 500 MG CAPS Take by mouth.     No current facility-administered medications on file prior to visit.     PAST MEDICAL HISTORY: Past Medical History:  Diagnosis Date  . Abnormal liver function tests 07/19/2015  . Allergic state 02/01/2010   Qualifier: Diagnosis of  By: Regis Bill MD, Standley Brooking   . Allergy   . Anxiety   . Back pain   . Chicken pox 72 yrs old  . Depressive disorder, not elsewhere classified   . Diabetes ([place])    diet controlled- no medicines   . Fibroid   . History of fractured kneecap    right   . Hyperglycemia   . Hyperlipidemia   . Infertility, female   . Joint pain   . Knee pain, left 10/05/2011   Follows with Dr Margarette Canada  . Measles as a child  . Medicare annual wellness visit,  subsequent 07/06/2014  . OA (osteoarthritis) of knee    left knee  . Obesity 10/05/2011  . Obesity 10/05/2011  . Osteoarthritis   . Osteopenia 06/26/2014  . Other and unspecified hyperlipidemia   . Overweight 10/05/2011  . Overweight(278.02) 10/05/2011  . Sleep apnea     On CPAP  . Sleep apnea   . Stye 11/19/2012  . Syncope 01/26/2017  . Thyroid disease    h/o hyperthyroid treated with radioactive iodine? from 16 to 20  . Type 2 diabetes mellitus with hyperlipidemia ([place])   . Varicose vein of leg     PAST SURGICAL HISTORY: Past Surgical History:  Procedure Laterality Date  . BACK SURGERY     L5 discectomy, 2002. helpful  . CERVICAL BIOPSY  W/ LOOP ELECTRODE EXCISION    . CESAREAN SECTION  1987  . COLONOSCOPY    . CPAP    . KNEE SURGERY  Right 2006   R knee fx. patella  . LEEP    . stitches in foot Right     SOCIAL HISTORY: Social History   Tobacco Use  . Smoking status: Never Smoker  . Smokeless tobacco: Never Used  Substance Use Topics  . Alcohol use: Yes    Alcohol/week: 0.6 oz    Types: 1 Glasses of wine per week  . Drug use: No    FAMILY HISTORY: Family History  Problem Relation Age of Onset  . Hyperlipidemia Mother   . Dementia Mother   . Obesity Mother   . Hyperlipidemia Father   . Heart disease Father        bypass, CAD  . Sleep apnea Father   . Hyperlipidemia Sister   . Diabetes Sister        type 2  . Colon polyps Sister   . Dementia Maternal Grandmother        alzheimer  . Alcohol abuse Maternal Grandfather   . Diabetes Paternal Grandmother        type 2?  . Colon cancer Neg Hx   . Esophageal cancer Neg Hx   . Rectal cancer Neg Hx   . Stomach cancer Neg Hx     ROS: Review of Systems  Constitutional: Positive for weight loss.  Gastrointestinal: Negative for nausea.  Endo/Heme/Allergies:       Positive polyphagia Negative hypoglycemia    PHYSICAL EXAM: Blood pressure (!) 114/58, pulse (!) 59, temperature (!) 97.5 F (36.4 C), temperature source Oral, height 5\' 5"  (1.651 m), weight 161 lb (73 kg), last menstrual period 04/04/2000, SpO2 98 %. Body mass index is 26.79 kg/m. Physical Exam  Constitutional: She is oriented to person, place, and time. She appears well-developed and well-nourished.  Cardiovascular: Normal rate.  Pulmonary/Chest: Effort normal.  Musculoskeletal: Normal range of motion.  Neurological: She is oriented to person, place, and time.  Skin: Skin is warm and dry.  Psychiatric: She has a normal mood and affect. Her behavior is normal.  Vitals reviewed.   RECENT LABS AND TESTS: BMET    Component Value Date/Time   NA 139 07/31/2017 1411   NA 141 07/20/2017 1229   K 4.0 07/31/2017 1411   CL 104 07/31/2017 1411   CO2 29 07/31/2017 1411   GLUCOSE 81  07/31/2017 1411   BUN 26 (H) 07/31/2017 1411   BUN 29 (H) 07/20/2017 1229   CREATININE 0.67 07/31/2017 1411   CALCIUM 9.7 07/31/2017 1411   GFRNONAA 73 07/20/2017 1229   GFRAA 84 07/20/2017 1229   Lab Results  Component Value Date   HGBA1C 5.7 (H) 07/20/2017   HGBA1C 5.8 (H) 01/10/2017   HGBA1C 6.1 08/05/2016   HGBA1C 5.9 07/06/2015   HGBA1C 5.6 01/02/2015   Lab Results  Component Value Date   INSULIN 9.7 07/20/2017   INSULIN 11.7 01/10/2017   CBC    Component Value Date/Time   WBC 6.2 01/10/2017 0938   WBC 7.4 08/05/2016 0956   RBC 4.51 01/10/2017 0938   RBC 4.56 08/05/2016 0956   HGB 13.2 01/10/2017 0938   HCT 41.3 01/10/2017 0938   PLT 308.0 08/05/2016 0956   MCV 92 01/10/2017 0938   MCH 29.3 01/10/2017 0938   MCHC 32.0 01/10/2017 0938   MCHC 33.6 08/05/2016 0956   RDW 14.0 01/10/2017 0938   LYMPHSABS 2.1 01/10/2017 0938   MONOABS 0.7 08/05/2016 0956   EOSABS 0.2 01/10/2017 0938   BASOSABS 0.1 01/10/2017 0938   Iron/TIBC/Ferritin/ %Sat No results found for: IRON, TIBC, FERRITIN, IRONPCTSAT Lipid Panel     Component Value Date/Time   CHOL 234 (H) 07/20/2017 1229   TRIG 124 07/20/2017 1229   TRIG 94 04/07/2006 1019   HDL 57 07/20/2017 1229   CHOLHDL 4.1 07/20/2017 1229   CHOLHDL 4 08/05/2016 0956   VLDL 20.0 08/05/2016 0956   LDLCALC 152 (H) 07/20/2017 1229   LDLDIRECT 145.4 11/14/2012 0823   Hepatic Function Panel     Component Value Date/Time   PROT 6.9 07/31/2017 1411   PROT 6.7 07/20/2017 1229   ALBUMIN 3.8 07/31/2017 1411   ALBUMIN 3.9 07/20/2017 1229   AST 16 07/31/2017 1411   ALT 14 07/31/2017 1411   ALKPHOS 67 07/31/2017 1411   BILITOT 0.4 07/31/2017 1411   BILITOT <0.2 07/20/2017 1229   BILIDIR 0.1 10/29/2013 0831      Component Value Date/Time   TSH 1.020 01/10/2017 0938   TSH 1.21 08/05/2016 0956   TSH 1.56 07/06/2015 0936    ASSESSMENT AND PLAN: Prediabetes  Class 1 obesity with serious comorbidity and body mass index (BMI)  of 30.0 to 30.9 in adult, unspecified obesity type - Starting BMI greater then 30  PLAN:  Pre-Diabetes Cindy [name] will continue to work on weight loss, exercise, and decreasing simple carbohydrates in her diet to help decrease the risk of diabetes. We dicussed metformin including benefits and risks. She was informed that eating too many simple carbohydrates or too many calories at one sitting increases the likelihood of GI side effects. Cindy [name] is to take both doses in the morning and back to diet prescription. Cindy [name] agrees to follow up with our clinic in 2 to 3 weeks as directed to monitor her progress.  We spent > than 50% of the 15 minute visit on the counseling as documented in the note.  Obesity Cindy [name] is currently in the action stage of change. As such, her goal is to continue with weight loss efforts She has agreed to keep a food journal with 1000-1100 calories and 70+ grams of protein daily Cindy [name] has been instructed to work up to a goal of 150 minutes of combined cardio and strengthening exercise per week for weight loss and overall health benefits. We discussed the following Behavioral Modification Strategies today: increasing lean protein intake, work on meal planning and easy cooking plans, and no skipping meals   Cindy [name] has agreed to follow up with our clinic in 2 to 3 weeks. She was informed of the importance of frequent follow up visits to maximize her success with intensive lifestyle modifications for her  multiple health conditions.   OBESITY BEHAVIORAL INTERVENTION VISIT  Today's visit was # 15 out of 22.  Starting weight: 185 lbs Starting date: 01/10/17 Today's weight : 161 lbs  Today's date: 08/31/2017 Total lbs lost to date: 24 (Patients must lose 7 lbs in the first 6 months to continue with counseling)   ASK: We discussed the diagnosis of obesity with Cindy [name] today and Cindy [name] agreed to give [name] permission to discuss obesity behavioral modification therapy  today.  ASSESS: Cindy [name] has the diagnosis of obesity and her BMI today is 26.79 Cindy [name] is in the action stage of change   ADVISE: Cindy [name] was educated on the multiple health risks of obesity as well as the benefit of weight loss to improve her health. She was advised of the need for long term treatment and the importance of lifestyle modifications.  AGREE: Multiple dietary modification options and treatment options were discussed and  Cindy [name] agreed to the above obesity treatment plan.  I, Trixie Dredge, am acting as transcriptionist for Dennard Nip, MD  I have reviewed the above documentation for accuracy and completeness, and I agree with the above. -Dennard Nip, MD

## 2017-09-16 ENCOUNTER — Other Ambulatory Visit (INDEPENDENT_AMBULATORY_CARE_PROVIDER_SITE_OTHER): Payer: Self-pay | Admitting: Family Medicine

## 2017-09-16 DIAGNOSIS — R7303 Prediabetes: Secondary | ICD-10-CM

## 2017-09-19 ENCOUNTER — Telehealth: Payer: Self-pay

## 2017-09-19 DIAGNOSIS — R490 Dysphonia: Secondary | ICD-10-CM

## 2017-09-19 NOTE — Telephone Encounter (Signed)
Copied from Regino Ramirez [phone]. Topic: Referral - Request >> Sep 15, 2017 10:11 AM Lennox Solders wrote: Reason for CRM: pt is requesting to see ENT she has been having hoarseness since may. Pt would like  ENT in [place]. Pt has [employer]

## 2017-09-21 ENCOUNTER — Telehealth: Payer: Self-pay

## 2017-09-21 ENCOUNTER — Ambulatory Visit (INDEPENDENT_AMBULATORY_CARE_PROVIDER_SITE_OTHER): Payer: Medicare HMO | Admitting: Family Medicine

## 2017-09-21 VITALS — BP 124/68 | HR 57 | Temp 97.7°F | Ht 65.0 in | Wt 162.0 lb

## 2017-09-21 DIAGNOSIS — E669 Obesity, unspecified: Secondary | ICD-10-CM | POA: Diagnosis not present

## 2017-09-21 DIAGNOSIS — Z6830 Body mass index (BMI) 30.0-30.9, adult: Secondary | ICD-10-CM | POA: Diagnosis not present

## 2017-09-21 DIAGNOSIS — R7303 Prediabetes: Secondary | ICD-10-CM | POA: Diagnosis not present

## 2017-09-21 DIAGNOSIS — R490 Dysphonia: Secondary | ICD-10-CM

## 2017-09-21 MED ORDER — METFORMIN HCL 500 MG PO TABS: 500.0000 mg | ORAL_TABLET | Freq: Two times a day (BID) | ORAL | 0 refills | Status: DC

## 2017-09-21 NOTE — Telephone Encounter (Signed)
Copied from [place] [phone]. Topic: Referral - Request >> Sep 15, 2017 10:11 AM Lennox Solders wrote: Reason for CRM: pt is requesting to see ENT she has been having hoarseness since may. Pt would like  ENT in [place]. Pt has [employer] medicare >> Sep 20, 2017  1:34 PM Neva Seat wrote: Pt calling back to check on the status of her ENT referral. Pt is expressing she really needs to get his going. Please call pt back to give her the status asap.

## 2017-09-21 NOTE — Progress Notes (Signed)
Office: [phone]  /  Fax: [phone]   HPI:   Chief Complaint: OBESITY Cindy [name] is here to discuss her progress with her obesity treatment plan. She is on the keep a food journal with 1000-1100 calories and 70+ grams of protein daily and is following her eating plan approximately 50 % of the time. She states she is doing zumba exercise and walking for 45-60 minutes 1 times per week. Cindy [name] had increased celebration and social eating in the last few weeks but has done well mostly maintaining her weight. She was changed to journaling but didn't want to do that and would rather go back to [place].  Her weight is 162 lb (73.5 kg) today and has gained 1 pound since her last visit. She has lost 23 lbs since starting treatment with [name].  Pre-Diabetes Cindy [name] has a diagnosis of pre-diabetes based on her elevated Hgb A1c and was informed this puts her at greater risk of developing diabetes. She is stable on metformin and is working on diet and exercise to decrease risk of diabetes. She denies nausea, vomiting, or hypoglycemia.  ALLERGIES: Allergies  Allergen Reactions  . Codeine Other (See Comments)    hallucinations  . Crestor [Rosuvastatin]     REACTION: myalgias  . Other Other (See Comments)    NOVACAINE- blisters left side of mouth after receiving   . Amoxicillin Rash  . Erythromycin Other (See Comments)    cramps  . Sulfa Antibiotics Rash  . Sulfonamide Derivatives Rash    MEDICATIONS: Current Outpatient Medications on File Prior to Visit  Medication Sig Dispense Refill  . aspirin 81 MG tablet Take 81 mg by mouth daily.    . cholecalciferol (VITAMIN D) 1000 units tablet Take 2,000 Units by mouth daily.     . cycloSPORINE (RESTASIS) 0.05 % ophthalmic emulsion Place 1 drop into both eyes daily as needed. Reported on 07/09/2015    . DULoxetine (CYMBALTA) 30 MG capsule TAKE ONE CAPSULE BY MOUTH DAILY (Patient taking differently: TAKE ONE CAPSULE EVERY OTHER DAY) 30 capsule 0  .  fluticasone (FLONASE) 50 MCG/ACT nasal spray Place 1 spray into both nostrils as needed.     Marland Kitchen levocetirizine (XYZAL) 5 MG tablet Take 5 mg by mouth as needed.   3  . pravastatin (PRAVACHOL) 80 MG tablet TAKE ONE-HALF (1/2) TABLET BY MOUTH DAILY 45 tablet 0  . Turmeric 500 MG CAPS Take by mouth.     No current facility-administered medications on file prior to visit.     PAST MEDICAL HISTORY: Past Medical History:  Diagnosis Date  . Abnormal liver function tests 07/19/2015  . Allergic state 02/01/2010   Qualifier: Diagnosis of  By: Regis Bill MD, Standley Brooking   . Allergy   . Anxiety   . Back pain   . Chicken pox 72 yrs old  . Depressive disorder, not elsewhere classified   . Diabetes ([place])    diet controlled- no medicines   . Fibroid   . History of fractured kneecap    right   . Hyperglycemia   . Hyperlipidemia   . Infertility, female   . Joint pain   . Knee pain, left 10/05/2011   Follows with Dr Margarette Canada  . Measles as a child  . Medicare annual wellness visit, subsequent 07/06/2014  . OA (osteoarthritis) of knee    left knee  . Obesity 10/05/2011  . Obesity 10/05/2011  . Osteoarthritis   . Osteopenia 06/26/2014  . Other and unspecified hyperlipidemia   . Overweight  10/05/2011  . Overweight(278.02) 10/05/2011  . Sleep apnea     On CPAP  . Sleep apnea   . Stye 11/19/2012  . Syncope 01/26/2017  . Thyroid disease    h/o hyperthyroid treated with radioactive iodine? from 16 to 20  . Type 2 diabetes mellitus with hyperlipidemia ([place])   . Varicose vein of leg     PAST SURGICAL HISTORY: Past Surgical History:  Procedure Laterality Date  . BACK SURGERY     L5 discectomy, 2002. helpful  . CERVICAL BIOPSY  W/ LOOP ELECTRODE EXCISION    . CESAREAN SECTION  1987  . COLONOSCOPY    . CPAP    . KNEE SURGERY Right 2006   R knee fx. patella  . LEEP    . stitches in foot Right     SOCIAL HISTORY: Social History   Tobacco Use  . Smoking status: Never Smoker  . Smokeless tobacco: Never  Used  Substance Use Topics  . Alcohol use: Yes    Alcohol/week: 0.6 oz    Types: 1 Glasses of wine per week  . Drug use: No    FAMILY HISTORY: Family History  Problem Relation Age of Onset  . Hyperlipidemia Mother   . Dementia Mother   . Obesity Mother   . Hyperlipidemia Father   . Heart disease Father        bypass, CAD  . Sleep apnea Father   . Hyperlipidemia Sister   . Diabetes Sister        type 2  . Colon polyps Sister   . Dementia Maternal Grandmother        alzheimer  . Alcohol abuse Maternal Grandfather   . Diabetes Paternal Grandmother        type 2?  . Colon cancer Neg Hx   . Esophageal cancer Neg Hx   . Rectal cancer Neg Hx   . Stomach cancer Neg Hx     ROS: Review of Systems  Constitutional: Negative for weight loss.  Gastrointestinal: Negative for nausea and vomiting.  Endo/Heme/Allergies:       Negative hypoglycemia    PHYSICAL EXAM: Blood pressure 124/68, pulse (!) 57, temperature 97.7 F (36.5 C), temperature source Oral, height 5\' 5"  (1.651 m), weight 162 lb (73.5 kg), last menstrual period 04/04/2000, SpO2 99 %. Body mass index is 26.96 kg/m. Physical Exam  Constitutional: She is oriented to person, place, and time. She appears well-developed and well-nourished.  Cardiovascular: Normal rate.  Pulmonary/Chest: Effort normal.  Musculoskeletal: Normal range of motion.  Neurological: She is oriented to person, place, and time.  Skin: Skin is warm and dry.  Psychiatric: She has a normal mood and affect. Her behavior is normal.  Vitals reviewed.   RECENT LABS AND TESTS: BMET    Component Value Date/Time   NA 139 07/31/2017 1411   NA 141 07/20/2017 1229   K 4.0 07/31/2017 1411   CL 104 07/31/2017 1411   CO2 29 07/31/2017 1411   GLUCOSE 81 07/31/2017 1411   BUN 26 (H) 07/31/2017 1411   BUN 29 (H) 07/20/2017 1229   CREATININE 0.67 07/31/2017 1411   CALCIUM 9.7 07/31/2017 1411   GFRNONAA 73 07/20/2017 1229   GFRAA 84 07/20/2017 1229    Lab Results  Component Value Date   HGBA1C 5.7 (H) 07/20/2017   HGBA1C 5.8 (H) 01/10/2017   HGBA1C 6.1 08/05/2016   HGBA1C 5.9 07/06/2015   HGBA1C 5.6 01/02/2015   Lab Results  Component Value Date  INSULIN 9.7 07/20/2017   INSULIN 11.7 01/10/2017   CBC    Component Value Date/Time   WBC 6.2 01/10/2017 0938   WBC 7.4 08/05/2016 0956   RBC 4.51 01/10/2017 0938   RBC 4.56 08/05/2016 0956   HGB 13.2 01/10/2017 0938   HCT 41.3 01/10/2017 0938   PLT 308.0 08/05/2016 0956   MCV 92 01/10/2017 0938   MCH 29.3 01/10/2017 0938   MCHC 32.0 01/10/2017 0938   MCHC 33.6 08/05/2016 0956   RDW 14.0 01/10/2017 0938   LYMPHSABS 2.1 01/10/2017 0938   MONOABS 0.7 08/05/2016 0956   EOSABS 0.2 01/10/2017 0938   BASOSABS 0.1 01/10/2017 0938   Iron/TIBC/Ferritin/ %Sat No results found for: IRON, TIBC, FERRITIN, IRONPCTSAT Lipid Panel     Component Value Date/Time   CHOL 234 (H) 07/20/2017 1229   TRIG 124 07/20/2017 1229   TRIG 94 04/07/2006 1019   HDL 57 07/20/2017 1229   CHOLHDL 4.1 07/20/2017 1229   CHOLHDL 4 08/05/2016 0956   VLDL 20.0 08/05/2016 0956   LDLCALC 152 (H) 07/20/2017 1229   LDLDIRECT 145.4 11/14/2012 0823   Hepatic Function Panel     Component Value Date/Time   PROT 6.9 07/31/2017 1411   PROT 6.7 07/20/2017 1229   ALBUMIN 3.8 07/31/2017 1411   ALBUMIN 3.9 07/20/2017 1229   AST 16 07/31/2017 1411   ALT 14 07/31/2017 1411   ALKPHOS 67 07/31/2017 1411   BILITOT 0.4 07/31/2017 1411   BILITOT <0.2 07/20/2017 1229   BILIDIR 0.1 10/29/2013 0831      Component Value Date/Time   TSH 1.020 01/10/2017 0938   TSH 1.21 08/05/2016 0956   TSH 1.56 07/06/2015 0936    ASSESSMENT AND PLAN: Prediabetes - Plan: metFORMIN (GLUCOPHAGE) 500 MG tablet  Class 1 obesity with serious comorbidity and body mass index (BMI) of 30.0 to 30.9 in adult, unspecified obesity type - Starting BMI greater then 30  PLAN:  Pre-Diabetes Cindy [name] will continue to work on weight loss,  diet, exercise, and decreasing simple carbohydrates in her diet to help decrease the risk of diabetes. We dicussed metformin including benefits and risks. She was informed that eating too many simple carbohydrates or too many calories at one sitting increases the likelihood of GI side effects. Cindy [name] agrees to continue taking metformin 500 mg BID #60 and we will refill for 1 month. Cindy [name] agrees to follow up with our clinic in 3 weeks as directed to monitor her progress.  Obesity Cindy [name] is currently in the action stage of change. As such, her goal is to continue with weight loss efforts She has agreed to follow the Pescatarian eating plan Cindy [name] has been instructed to work up to a goal of 150 minutes of combined cardio and strengthening exercise per week for weight loss and overall health benefits. We discussed the following Behavioral Modification Strategies today: increasing lean protein intake, decreasing simple carbohydrates,  and travel eating strategies    Cindy [name] has agreed to follow up with our clinic in 3 weeks. She was informed of the importance of frequent follow up visits to maximize her success with intensive lifestyle modifications for her multiple health conditions.   OBESITY BEHAVIORAL INTERVENTION VISIT  Today's visit was # 16 out of 22.  Starting weight: 185 lbs Starting date: 01/10/17 Today's weight : 162 lbs Today's date: 09/21/2017 Total lbs lost to date: 23 (Patients must lose 7 lbs in the first 6 months to continue with counseling)   ASK: We discussed the diagnosis of obesity  with Cindy [name] today and Cindy [name] agreed to give [name] permission to discuss obesity behavioral modification therapy today.  ASSESS: Cindy [name] has the diagnosis of obesity and her BMI today is 26.96 Vester is in the action stage of change   ADVISE: Cindy [name] was educated on the multiple health risks of obesity as well as the benefit of weight loss to improve her health. She was advised of the need for long  term treatment and the importance of lifestyle modifications.  AGREE: Multiple dietary modification options and treatment options were discussed and  Cindy [name] agreed to the above obesity treatment plan.  I, Trixie Dredge, am acting as transcriptionist for Dennard Nip, MD  I have reviewed the above documentation for accuracy and completeness, and I agree with the above. -Dennard Nip, MD

## 2017-10-03 DIAGNOSIS — J342 Deviated nasal septum: Secondary | ICD-10-CM | POA: Diagnosis not present

## 2017-10-03 DIAGNOSIS — Z7289 Other problems related to lifestyle: Secondary | ICD-10-CM | POA: Diagnosis not present

## 2017-10-03 DIAGNOSIS — R490 Dysphonia: Secondary | ICD-10-CM | POA: Diagnosis not present

## 2017-10-12 ENCOUNTER — Ambulatory Visit (INDEPENDENT_AMBULATORY_CARE_PROVIDER_SITE_OTHER): Payer: Medicare HMO | Admitting: Physician Assistant

## 2017-10-12 VITALS — BP 113/62 | HR 68 | Temp 97.6°F | Ht 65.0 in | Wt 165.0 lb

## 2017-10-12 DIAGNOSIS — Z6830 Body mass index (BMI) 30.0-30.9, adult: Secondary | ICD-10-CM

## 2017-10-12 DIAGNOSIS — E669 Obesity, unspecified: Secondary | ICD-10-CM | POA: Diagnosis not present

## 2017-10-12 DIAGNOSIS — E559 Vitamin D deficiency, unspecified: Secondary | ICD-10-CM | POA: Diagnosis not present

## 2017-10-12 NOTE — Progress Notes (Signed)
Office: [phone]  /  Fax: [phone]   HPI:   Chief Complaint: OBESITY Cindy [name] is here to discuss her progress with her obesity treatment plan. She is on the Pescatarian eating plan and is following her eating plan approximately 35 % of the time. She states she is swimming for 30 minutes 2 times per week. Cindy [name] has not been as mindful of her eating. Also, she eats out more.  Her weight is 165 lb (74.8 kg) today and has gained 3 pounds since her last visit. She has lost 20 lbs since starting treatment with [name].  Vitamin D Deficiency Cindy [name] has a diagnosis of vitamin D deficiency. She is currently taking OTC Vit D and denies nausea, vomiting or muscle weakness.  ALLERGIES: Allergies  Allergen Reactions  . Codeine Other (See Comments)    hallucinations  . Crestor [Rosuvastatin]     REACTION: myalgias  . Other Other (See Comments)    NOVACAINE- blisters left side of mouth after receiving   . Amoxicillin Rash  . Erythromycin Other (See Comments)    cramps  . Sulfa Antibiotics Rash  . Sulfonamide Derivatives Rash    MEDICATIONS: Current Outpatient Medications on File Prior to Visit  Medication Sig Dispense Refill  . aspirin 81 MG tablet Take 81 mg by mouth daily.    . cholecalciferol (VITAMIN D) 1000 units tablet Take 2,000 Units by mouth daily.     . cycloSPORINE (RESTASIS) 0.05 % ophthalmic emulsion Place 1 drop into both eyes daily as needed. Reported on 07/09/2015    . DULoxetine (CYMBALTA) 30 MG capsule TAKE ONE CAPSULE BY MOUTH DAILY (Patient taking differently: TAKE ONE CAPSULE EVERY OTHER DAY) 30 capsule 0  . fluticasone (FLONASE) 50 MCG/ACT nasal spray Place 1 spray into both nostrils as needed.     Marland Kitchen levocetirizine (XYZAL) 5 MG tablet Take 5 mg by mouth as needed.   3  . metFORMIN (GLUCOPHAGE) 500 MG tablet Take 1 tablet (500 mg total) by mouth 2 (two) times daily with a meal. 60 tablet 0  . pravastatin (PRAVACHOL) 80 MG tablet TAKE ONE-HALF (1/2) TABLET BY MOUTH DAILY  45 tablet 0  . Turmeric 500 MG CAPS Take by mouth.     No current facility-administered medications on file prior to visit.     PAST MEDICAL HISTORY: Past Medical History:  Diagnosis Date  . Abnormal liver function tests 07/19/2015  . Allergic state 02/01/2010   Qualifier: Diagnosis of  By: Regis Bill MD, Standley Brooking   . Allergy   . Anxiety   . Back pain   . Chicken pox 72 yrs old  . Depressive disorder, not elsewhere classified   . Diabetes ([place])    diet controlled- no medicines   . Fibroid   . History of fractured kneecap    right   . Hyperglycemia   . Hyperlipidemia   . Infertility, female   . Joint pain   . Knee pain, left 10/05/2011   Follows with Dr Margarette Canada  . Measles as a child  . Medicare annual wellness visit, subsequent 07/06/2014  . OA (osteoarthritis) of knee    left knee  . Obesity 10/05/2011  . Obesity 10/05/2011  . Osteoarthritis   . Osteopenia 06/26/2014  . Other and unspecified hyperlipidemia   . Overweight 10/05/2011  . Overweight(278.02) 10/05/2011  . Sleep apnea     On CPAP  . Sleep apnea   . Stye 11/19/2012  . Syncope 01/26/2017  . Thyroid disease    h/o  hyperthyroid treated with radioactive iodine? from 16 to 20  . Type 2 diabetes mellitus with hyperlipidemia ([place])   . Varicose vein of leg     PAST SURGICAL HISTORY: Past Surgical History:  Procedure Laterality Date  . BACK SURGERY     L5 discectomy, 2002. helpful  . CERVICAL BIOPSY  W/ LOOP ELECTRODE EXCISION    . CESAREAN SECTION  1987  . COLONOSCOPY    . CPAP    . KNEE SURGERY Right 2006   R knee fx. patella  . LEEP    . stitches in foot Right     SOCIAL HISTORY: Social History   Tobacco Use  . Smoking status: Never Smoker  . Smokeless tobacco: Never Used  Substance Use Topics  . Alcohol use: Yes    Alcohol/week: 0.6 oz    Types: 1 Glasses of wine per week  . Drug use: No    FAMILY HISTORY: Family History  Problem Relation Age of Onset  . Hyperlipidemia Mother   . Dementia Mother   .  Obesity Mother   . Hyperlipidemia Father   . Heart disease Father        bypass, CAD  . Sleep apnea Father   . Hyperlipidemia Sister   . Diabetes Sister        type 2  . Colon polyps Sister   . Dementia Maternal Grandmother        alzheimer  . Alcohol abuse Maternal Grandfather   . Diabetes Paternal Grandmother        type 2?  . Colon cancer Neg Hx   . Esophageal cancer Neg Hx   . Rectal cancer Neg Hx   . Stomach cancer Neg Hx     ROS: Review of Systems  Constitutional: Negative for weight loss.  Gastrointestinal: Negative for nausea and vomiting.  Musculoskeletal:       Negative muscle weakness    PHYSICAL EXAM: Blood pressure 113/62, pulse 68, temperature 97.6 F (36.4 C), temperature source Oral, height 5\' 5"  (1.651 m), weight 165 lb (74.8 kg), last menstrual period 04/04/2000, SpO2 96 %. Body mass index is 27.46 kg/m. Physical Exam  Constitutional: She is oriented to person, place, and time. She appears well-developed and well-nourished.  Cardiovascular: Normal rate.  Pulmonary/Chest: Effort normal.  Musculoskeletal: Normal range of motion.  Neurological: She is oriented to person, place, and time.  Skin: Skin is warm and dry.  Psychiatric: She has a normal mood and affect. Her behavior is normal.  Vitals reviewed.   RECENT LABS AND TESTS: BMET    Component Value Date/Time   NA 139 07/31/2017 1411   NA 141 07/20/2017 1229   K 4.0 07/31/2017 1411   CL 104 07/31/2017 1411   CO2 29 07/31/2017 1411   GLUCOSE 81 07/31/2017 1411   BUN 26 (H) 07/31/2017 1411   BUN 29 (H) 07/20/2017 1229   CREATININE 0.67 07/31/2017 1411   CALCIUM 9.7 07/31/2017 1411   GFRNONAA 73 07/20/2017 1229   GFRAA 84 07/20/2017 1229   Lab Results  Component Value Date   HGBA1C 5.7 (H) 07/20/2017   HGBA1C 5.8 (H) 01/10/2017   HGBA1C 6.1 08/05/2016   HGBA1C 5.9 07/06/2015   HGBA1C 5.6 01/02/2015   Lab Results  Component Value Date   INSULIN 9.7 07/20/2017   INSULIN 11.7  01/10/2017   CBC    Component Value Date/Time   WBC 6.2 01/10/2017 0938   WBC 7.4 08/05/2016 0956   RBC 4.51 01/10/2017 0938  RBC 4.56 08/05/2016 0956   HGB 13.2 01/10/2017 0938   HCT 41.3 01/10/2017 0938   PLT 308.0 08/05/2016 0956   MCV 92 01/10/2017 0938   MCH 29.3 01/10/2017 0938   MCHC 32.0 01/10/2017 0938   MCHC 33.6 08/05/2016 0956   RDW 14.0 01/10/2017 0938   LYMPHSABS 2.1 01/10/2017 0938   MONOABS 0.7 08/05/2016 0956   EOSABS 0.2 01/10/2017 0938   BASOSABS 0.1 01/10/2017 0938   Iron/TIBC/Ferritin/ %Sat No results found for: IRON, TIBC, FERRITIN, IRONPCTSAT Lipid Panel     Component Value Date/Time   CHOL 234 (H) 07/20/2017 1229   TRIG 124 07/20/2017 1229   TRIG 94 04/07/2006 1019   HDL 57 07/20/2017 1229   CHOLHDL 4.1 07/20/2017 1229   CHOLHDL 4 08/05/2016 0956   VLDL 20.0 08/05/2016 0956   LDLCALC 152 (H) 07/20/2017 1229   LDLDIRECT 145.4 11/14/2012 0823   Hepatic Function Panel     Component Value Date/Time   PROT 6.9 07/31/2017 1411   PROT 6.7 07/20/2017 1229   ALBUMIN 3.8 07/31/2017 1411   ALBUMIN 3.9 07/20/2017 1229   AST 16 07/31/2017 1411   ALT 14 07/31/2017 1411   ALKPHOS 67 07/31/2017 1411   BILITOT 0.4 07/31/2017 1411   BILITOT <0.2 07/20/2017 1229   BILIDIR 0.1 10/29/2013 0831      Component Value Date/Time   TSH 1.020 01/10/2017 0938   TSH 1.21 08/05/2016 0956   TSH 1.56 07/06/2015 0936  Results for CONCHA, SUDOL (MRN [id number]) as of 10/12/2017 11:47  Ref. Range 07/20/2017 12:29  Vitamin D, 25-Hydroxy Latest Ref Range: 30.0 - 100.0 ng/mL 58.4    ASSESSMENT AND PLAN: Vitamin D deficiency  Class 1 obesity with serious comorbidity and body mass index (BMI) of 30.0 to 30.9 in adult, unspecified obesity type - BMI greater than 30 at start of program  PLAN:  Vitamin D Deficiency Latoya was informed that low vitamin D levels contributes to fatigue and are associated with obesity, breast, and colon cancer. Terricka agrees to continue  taking OTC Vit D and will follow up for routine testing of vitamin D, at least 2-3 times per year. She was informed of the risk of over-replacement of vitamin D and agrees to not increase her dose unless she discusses this with [name] first. Demetris agrees to follow up with our clinic in 3 weeks.  We spent > than 50% of the 15 minute visit on the counseling as documented in the note.  Obesity Elysa is currently in the action stage of change. As such, her goal is to get back to weightloss efforts  She has agreed to follow the Category 2 plan Jacquiline has been instructed to work up to a goal of 150 minutes of combined cardio and strengthening exercise per week for weight loss and overall health benefits. We discussed the following Behavioral Modification Strategies today: increasing lean protein intake and work on meal planning and easy cooking plans   Skyrah has agreed to follow up with our clinic in 3 weeks. She was informed of the importance of frequent follow up visits to maximize her success with intensive lifestyle modifications for her multiple health conditions.   OBESITY BEHAVIORAL INTERVENTION VISIT  Today's visit was # 17 out of 22.  Starting weight: 185 lbs Starting date: 01/10/17 Today's weight : 165 lbs  Today's date: 10/12/2017 Total lbs lost to date: 20 (Patients must lose 7 lbs in the first 6 months to continue with counseling)   ASK: We  discussed the diagnosis of obesity with Lurlean Leyden today and Alveta agreed to give [name] permission to discuss obesity behavioral modification therapy today.  ASSESS: Raeli has the diagnosis of obesity and her BMI today is 27.46 Samora is in the action stage of change   ADVISE: Marlisa was educated on the multiple health risks of obesity as well as the benefit of weight loss to improve her health. She was advised of the need for long term treatment and the importance of lifestyle modifications.  AGREE: Multiple dietary modification options and  treatment options were discussed and  Sinead agreed to the above obesity treatment plan.   Wilhemena Durie, am acting as transcriptionist for Lacy Duverney, PA-C I, Lacy Duverney [facility], have reviewed this note and agree with its content

## 2017-10-16 ENCOUNTER — Other Ambulatory Visit (INDEPENDENT_AMBULATORY_CARE_PROVIDER_SITE_OTHER): Payer: Self-pay | Admitting: Family Medicine

## 2017-10-16 DIAGNOSIS — R7303 Prediabetes: Secondary | ICD-10-CM

## 2017-10-20 ENCOUNTER — Other Ambulatory Visit: Payer: Self-pay | Admitting: Family Medicine

## 2017-10-20 NOTE — Telephone Encounter (Signed)
Copied from [place] [id number]. Topic: Referral - Request >> Sep 15, 2017 10:11 AM Lennox Solders wrote: Reason for CRM: pt is requesting to see ENT she has been having hoarseness since may. Pt would like  ENT in [place]. Pt has [employer]

## 2017-11-02 ENCOUNTER — Ambulatory Visit (INDEPENDENT_AMBULATORY_CARE_PROVIDER_SITE_OTHER): Payer: Medicare HMO | Admitting: Family Medicine

## 2017-11-02 VITALS — BP 118/65 | HR 62 | Temp 97.9°F | Ht 65.0 in | Wt 164.0 lb

## 2017-11-02 DIAGNOSIS — E669 Obesity, unspecified: Secondary | ICD-10-CM | POA: Diagnosis not present

## 2017-11-02 DIAGNOSIS — R7303 Prediabetes: Secondary | ICD-10-CM

## 2017-11-02 DIAGNOSIS — Z6830 Body mass index (BMI) 30.0-30.9, adult: Secondary | ICD-10-CM | POA: Diagnosis not present

## 2017-11-02 MED ORDER — METFORMIN HCL 500 MG PO TABS: ORAL_TABLET | ORAL | 0 refills | Status: DC

## 2017-11-02 NOTE — Progress Notes (Signed)
**Note Cindy-Identified via Obfuscation** Office: [phone]  /  Fax: [phone]   HPI:   Chief Complaint: OBESITY Cindy [name] is here to discuss her progress with her obesity treatment plan. She is on the Category 2 plan and is following her eating plan approximately 33-50 % of the time. She states she is exercising 0 minutes 0 times per week. Cindy [name] has increased traveling and will travel more over the next month. She is still doing well with portion control and smarter food choices and was able to lose weight even with eating out more.  Her weight is 164 lb (74.4 kg) today and has had a weight loss of 1 pound over a period of 3 weeks since her last visit. She has lost 21 lbs since starting treatment with Cindy [name].  Pre-Diabetes Cindy [name] has a diagnosis of pre-diabetes based on her elevated Hgb A1c and was informed this puts her at greater risk of developing diabetes. She is stable on metformin and denies nausea, vomiting, or hypoglycemia. She is working on diet but not exercising as much.  ALLERGIES: Allergies  Allergen Reactions  . Codeine Other (See Comments)    hallucinations  . Crestor [Rosuvastatin]     REACTION: myalgias  . Other Other (See Comments)    NOVACAINE- blisters left side of mouth after receiving   . Amoxicillin Rash  . Erythromycin Other (See Comments)    cramps  . Sulfa Antibiotics Rash  . Sulfonamide Derivatives Rash    MEDICATIONS: Current Outpatient Medications on File Prior to Visit  Medication Sig Dispense Refill  . aspirin 81 MG tablet Take 81 mg by mouth daily.    . cholecalciferol (VITAMIN D) 1000 units tablet Take 2,000 Units by mouth daily.     . cycloSPORINE (RESTASIS) 0.05 % ophthalmic emulsion Place 1 drop into both eyes daily as needed. Reported on 07/09/2015    . DULoxetine (CYMBALTA) 30 MG capsule TAKE ONE CAPSULE EVERY OTHER DAY 30 capsule 0  . fluticasone (FLONASE) 50 MCG/ACT nasal spray Place 1 spray into both nostrils as needed.     Marland Kitchen levocetirizine (XYZAL) 5 MG tablet Take 5 mg by mouth  as needed.   3  . metFORMIN (GLUCOPHAGE) 500 MG tablet TAKE ONE TABLET BY MOUTH TWICE A DAY WITH A MEAL 60 tablet 0  . pravastatin (PRAVACHOL) 80 MG tablet TAKE ONE-HALF (1/2) TABLET BY MOUTH DAILY 45 tablet 0  . Turmeric 500 MG CAPS Take by mouth.     No current facility-administered medications on file prior to visit.     PAST MEDICAL HISTORY: Past Medical History:  Diagnosis Date  . Abnormal liver function tests 07/19/2015  . Allergic state 02/01/2010   Qualifier: Diagnosis of  By: Regis Bill MD, Standley Brooking   . Allergy   . Anxiety   . Back pain   . Chicken pox 72 yrs old  . Depressive disorder, not elsewhere classified   . Diabetes (Cindy [name])    diet controlled- no medicines   . Fibroid   . History of fractured kneecap    right   . Hyperglycemia   . Hyperlipidemia   . Infertility, female   . Joint pain   . Knee pain, left 10/05/2011   Follows with Dr Margarette Canada  . Measles as a child  . Medicare annual wellness visit, subsequent 07/06/2014  . OA (osteoarthritis) of knee    left knee  . Obesity 10/05/2011  . Obesity 10/05/2011  . Osteoarthritis   . Osteopenia 06/26/2014  . Other and unspecified hyperlipidemia   .  Overweight 10/05/2011  . Overweight(278.02) 10/05/2011  . Sleep apnea     On CPAP  . Sleep apnea   . Stye 11/19/2012  . Syncope 01/26/2017  . Thyroid disease    h/o hyperthyroid treated with radioactive iodine? from 16 to 20  . Type 2 diabetes mellitus with hyperlipidemia (Cindy [name])   . Varicose vein of leg     PAST SURGICAL HISTORY: Past Surgical History:  Procedure Laterality Date  . BACK SURGERY     L5 discectomy, 2002. helpful  . CERVICAL BIOPSY  W/ LOOP ELECTRODE EXCISION    . CESAREAN SECTION  1987  . COLONOSCOPY    . CPAP    . KNEE SURGERY Right 2006   R knee fx. patella  . LEEP    . stitches in foot Right     SOCIAL HISTORY: Social History   Tobacco Use  . Smoking status: Never Smoker  . Smokeless tobacco: Never Used  Substance Use Topics  . Alcohol use: Yes      Alcohol/week: 0.6 oz    Types: 1 Glasses of wine per week  . Drug use: No    FAMILY HISTORY: Family History  Problem Relation Age of Onset  . Hyperlipidemia Mother   . Dementia Mother   . Obesity Mother   . Hyperlipidemia Father   . Heart disease Father        bypass, CAD  . Sleep apnea Father   . Hyperlipidemia Sister   . Diabetes Sister        type 2  . Colon polyps Sister   . Dementia Maternal Grandmother        alzheimer  . Alcohol abuse Maternal Grandfather   . Diabetes Paternal Grandmother        type 2?  . Colon cancer Neg Hx   . Esophageal cancer Neg Hx   . Rectal cancer Neg Hx   . Stomach cancer Neg Hx     ROS: Review of Systems  Constitutional: Positive for weight loss.  Gastrointestinal: Negative for nausea and vomiting.  Endo/Heme/Allergies:       Negative hypoglycemia    PHYSICAL EXAM: Blood pressure 118/65, pulse 62, temperature 97.9 F (36.6 C), temperature source Oral, height 5\' 5"  (1.651 m), weight 164 lb (74.4 kg), last menstrual period 04/04/2000, SpO2 98 %. Body mass index is 27.29 kg/m. Physical Exam  Constitutional: She is oriented to person, place, and time. She appears well-developed and well-nourished.  Cardiovascular: Normal rate.  Pulmonary/Chest: Effort normal.  Musculoskeletal: Normal range of motion.  Neurological: She is oriented to person, place, and time.  Skin: Skin is warm and dry.  Psychiatric: She has a normal mood and affect. Her behavior is normal.  Vitals reviewed.   RECENT LABS AND TESTS: BMET    Component Value Date/Time   NA 139 07/31/2017 1411   NA 141 07/20/2017 1229   K 4.0 07/31/2017 1411   CL 104 07/31/2017 1411   CO2 29 07/31/2017 1411   GLUCOSE 81 07/31/2017 1411   BUN 26 (H) 07/31/2017 1411   BUN 29 (H) 07/20/2017 1229   CREATININE 0.67 07/31/2017 1411   CALCIUM 9.7 07/31/2017 1411   GFRNONAA 73 07/20/2017 1229   GFRAA 84 07/20/2017 1229   Lab Results  Component Value Date   HGBA1C 5.7 (H)  07/20/2017   HGBA1C 5.8 (H) 01/10/2017   HGBA1C 6.1 08/05/2016   HGBA1C 5.9 07/06/2015   HGBA1C 5.6 01/02/2015   Lab Results  Component Value Date  INSULIN 9.7 07/20/2017   INSULIN 11.7 01/10/2017   CBC    Component Value Date/Time   WBC 6.2 01/10/2017 0938   WBC 7.4 08/05/2016 0956   RBC 4.51 01/10/2017 0938   RBC 4.56 08/05/2016 0956   HGB 13.2 01/10/2017 0938   HCT 41.3 01/10/2017 0938   PLT 308.0 08/05/2016 0956   MCV 92 01/10/2017 0938   MCH 29.3 01/10/2017 0938   MCHC 32.0 01/10/2017 0938   MCHC 33.6 08/05/2016 0956   RDW 14.0 01/10/2017 0938   LYMPHSABS 2.1 01/10/2017 0938   MONOABS 0.7 08/05/2016 0956   EOSABS 0.2 01/10/2017 0938   BASOSABS 0.1 01/10/2017 0938   Iron/TIBC/Ferritin/ %Sat No results found for: IRON, TIBC, FERRITIN, IRONPCTSAT Lipid Panel     Component Value Date/Time   CHOL 234 (H) 07/20/2017 1229   TRIG 124 07/20/2017 1229   TRIG 94 04/07/2006 1019   HDL 57 07/20/2017 1229   CHOLHDL 4.1 07/20/2017 1229   CHOLHDL 4 08/05/2016 0956   VLDL 20.0 08/05/2016 0956   LDLCALC 152 (H) 07/20/2017 1229   LDLDIRECT 145.4 11/14/2012 0823   Hepatic Function Panel     Component Value Date/Time   PROT 6.9 07/31/2017 1411   PROT 6.7 07/20/2017 1229   ALBUMIN 3.8 07/31/2017 1411   ALBUMIN 3.9 07/20/2017 1229   AST 16 07/31/2017 1411   ALT 14 07/31/2017 1411   ALKPHOS 67 07/31/2017 1411   BILITOT 0.4 07/31/2017 1411   BILITOT <0.2 07/20/2017 1229   BILIDIR 0.1 10/29/2013 0831      Component Value Date/Time   TSH 1.020 01/10/2017 0938   TSH 1.21 08/05/2016 0956   TSH 1.56 07/06/2015 0936    ASSESSMENT AND PLAN: Prediabetes - Plan: metFORMIN (GLUCOPHAGE) 500 MG tablet  Class 1 obesity with serious comorbidity and body mass index (BMI) of 30.0 to 30.9 in adult, unspecified obesity type - Starting BMI greater then 30  PLAN:  Pre-Diabetes Justin will continue to work on weight loss, exercise, and decreasing simple carbohydrates in her diet to  help decrease the risk of diabetes. We dicussed metformin including benefits and risks. She was informed that eating too many simple carbohydrates or too many calories at one sitting increases the likelihood of GI side effects. Ereka agrees to continue taking metformin 500 mg BID #60 and we will refill for 1 month. Penni agrees to follow up with our clinic in 4 weeks as directed to monitor her progress and we will check labs at that time.  Obesity Jaysa is currently in the action stage of change. As such, her goal is to continue with weight loss efforts She has agreed to portion control better and make smarter food choices, such as increase vegetables and decrease simple carbohydrates  Sherrelle has been instructed to work up to a goal of 150 minutes of combined cardio and strengthening exercise per week for weight loss and overall health benefits. We discussed the following Behavioral Modification Strategies today: dealing with family or coworker sabotage, increase H20 intake, and travel eating strategies    Kushi has agreed to follow up with our clinic in 4 weeks. She was informed of the importance of frequent follow up visits to maximize her success with intensive lifestyle modifications for her multiple health conditions.   OBESITY BEHAVIORAL INTERVENTION VISIT  Today's visit was # 18 out of 22.  Starting weight: 185 lbs Starting date: 01/10/17 Today's weight : 164 lbs  Today's date: 11/02/2017 Total lbs lost to date: 58  ASK: We discussed the diagnosis of obesity with Lurlean Leyden today and Lennie agreed to give Cindy [name] permission to discuss obesity behavioral modification therapy today.  ASSESS: Kristeena has the diagnosis of obesity and her BMI today is 27.29 Viktorya is in the action stage of change   ADVISE: Kristopher was educated on the multiple health risks of obesity as well as the benefit of weight loss to improve her health. She was advised of the need for long term treatment and the  importance of lifestyle modifications.  AGREE: Multiple dietary modification options and treatment options were discussed and  Demetrius agreed to the above obesity treatment plan.  I, Trixie Dredge, am acting as transcriptionist for Dennard Nip, MD  I have reviewed the above documentation for accuracy and completeness, and I agree with the above. -Dennard Nip, MD

## 2017-11-15 ENCOUNTER — Other Ambulatory Visit: Payer: Self-pay | Admitting: Family Medicine

## 2017-11-22 DIAGNOSIS — H52223 Regular astigmatism, bilateral: Secondary | ICD-10-CM | POA: Diagnosis not present

## 2017-11-22 DIAGNOSIS — H524 Presbyopia: Secondary | ICD-10-CM | POA: Diagnosis not present

## 2017-11-22 DIAGNOSIS — H5203 Hypermetropia, bilateral: Secondary | ICD-10-CM | POA: Diagnosis not present

## 2017-11-24 DIAGNOSIS — G4733 Obstructive sleep apnea (adult) (pediatric): Secondary | ICD-10-CM | POA: Diagnosis not present

## 2017-11-30 ENCOUNTER — Ambulatory Visit (INDEPENDENT_AMBULATORY_CARE_PROVIDER_SITE_OTHER): Payer: Medicare HMO | Admitting: Physician Assistant

## 2017-11-30 ENCOUNTER — Encounter (INDEPENDENT_AMBULATORY_CARE_PROVIDER_SITE_OTHER): Payer: Self-pay | Admitting: Physician Assistant

## 2017-11-30 VITALS — BP 100/58 | HR 57 | Temp 97.6°F | Ht 65.0 in | Wt 164.0 lb

## 2017-11-30 DIAGNOSIS — E559 Vitamin D deficiency, unspecified: Secondary | ICD-10-CM

## 2017-11-30 DIAGNOSIS — Z6830 Body mass index (BMI) 30.0-30.9, adult: Secondary | ICD-10-CM | POA: Diagnosis not present

## 2017-11-30 DIAGNOSIS — R7303 Prediabetes: Secondary | ICD-10-CM

## 2017-11-30 DIAGNOSIS — E669 Obesity, unspecified: Secondary | ICD-10-CM | POA: Diagnosis not present

## 2017-11-30 DIAGNOSIS — E7849 Other hyperlipidemia: Secondary | ICD-10-CM

## 2017-11-30 NOTE — Progress Notes (Signed)
Office: [phone]  /  Fax: [phone]   HPI:   Chief Complaint: OBESITY Cindy [name] is here to discuss her progress with her obesity treatment plan. She is on the Pescatarian plan and is following her eating plan approximately 30 % of the time. She states she is exercising 0 minutes 0 times per week. Cindy [name] did a good job maintaining her weight. She reports that she would like continue to lose weight. She has maintained her weight over the last few months. Her weight is 164 lb (74.4 kg) today and has not lost weight since her last visit. She has lost 21 lbs since starting treatment with [name].  Vitamin D deficiency Cindy [name] has a diagnosis of vitamin D deficiency. She is currently taking OTC vit D and denies nausea, vomiting or muscle weakness.  Pre-Diabetes Cindy [name] has a diagnosis of prediabetes based on her elevated Hgb A1c and was informed this puts her at greater risk of developing diabetes. She is taking metformin currently and continues to work on diet and exercise to decrease risk of diabetes. She denies nausea, polyphagia, or hypoglycemia.  Hyperlipidemia Cindy [name] has hyperlipidemia and has been trying to improve her cholesterol levels with intensive lifestyle modification including a low saturated fat diet, exercise and weight loss. She is taking pravastatin 80mg .  She denies any chest pain, claudication or myalgias.   ALLERGIES: Allergies  Allergen Reactions  . Codeine Other (See Comments)    hallucinations  . Crestor [Rosuvastatin]     REACTION: myalgias  . Other Other (See Comments)    NOVACAINE- blisters left side of mouth after receiving   . Amoxicillin Rash  . Erythromycin Other (See Comments)    cramps  . Sulfa Antibiotics Rash  . Sulfonamide Derivatives Rash    MEDICATIONS: Current Outpatient Medications on File Prior to Visit  Medication Sig Dispense Refill  . aspirin 81 MG tablet Take 81 mg by mouth daily.    . cholecalciferol (VITAMIN D) 1000 units tablet Take  2,000 Units by mouth daily.     . cycloSPORINE (RESTASIS) 0.05 % ophthalmic emulsion Place 1 drop into both eyes daily as needed. Reported on 07/09/2015    . DULoxetine (CYMBALTA) 30 MG capsule TAKE ONE CAPSULE EVERY OTHER DAY 30 capsule 0  . fluticasone (FLONASE) 50 MCG/ACT nasal spray Place 1 spray into both nostrils as needed.     Marland Kitchen levocetirizine (XYZAL) 5 MG tablet Take 5 mg by mouth as needed.   3  . metFORMIN (GLUCOPHAGE) 500 MG tablet TAKE ONE TABLET BY MOUTH TWICE A DAY WITH A MEAL 60 tablet 0  . pravastatin (PRAVACHOL) 80 MG tablet TAKE ONE-HALF (1/2) TABLET BY MOUTH DAILY 45 tablet 1  . Turmeric 500 MG CAPS Take by mouth.     No current facility-administered medications on file prior to visit.     PAST MEDICAL HISTORY: Past Medical History:  Diagnosis Date  . Abnormal liver function tests 07/19/2015  . Allergic state 02/01/2010   Qualifier: Diagnosis of  By: Regis Bill MD, Standley Brooking   . Allergy   . Anxiety   . Back pain   . Chicken pox 72 yrs old  . Depressive disorder, not elsewhere classified   . Diabetes ([place])    diet controlled- no medicines   . Fibroid   . History of fractured kneecap    right   . Hyperglycemia   . Hyperlipidemia   . Infertility, female   . Joint pain   . Knee pain, left 10/05/2011  Follows with Dr Margarette Canada  . Measles as a child  . Medicare annual wellness visit, subsequent 07/06/2014  . OA (osteoarthritis) of knee    left knee  . Obesity 10/05/2011  . Obesity 10/05/2011  . Osteoarthritis   . Osteopenia 06/26/2014  . Other and unspecified hyperlipidemia   . Overweight 10/05/2011  . Overweight(278.02) 10/05/2011  . Sleep apnea     On CPAP  . Sleep apnea   . Stye 11/19/2012  . Syncope 01/26/2017  . Thyroid disease    h/o hyperthyroid treated with radioactive iodine? from 16 to 20  . Type 2 diabetes mellitus with hyperlipidemia ([place])   . Varicose vein of leg     PAST SURGICAL HISTORY: Past Surgical History:  Procedure Laterality Date  . BACK SURGERY      L5 discectomy, 2002. helpful  . CERVICAL BIOPSY  W/ LOOP ELECTRODE EXCISION    . CESAREAN SECTION  1987  . COLONOSCOPY    . CPAP    . KNEE SURGERY Right 2006   R knee fx. patella  . LEEP    . stitches in foot Right     SOCIAL HISTORY: Social History   Tobacco Use  . Smoking status: Never Smoker  . Smokeless tobacco: Never Used  Substance Use Topics  . Alcohol use: Yes    Alcohol/week: 1.0 standard drinks    Types: 1 Glasses of wine per week  . Drug use: No    FAMILY HISTORY: Family History  Problem Relation Age of Onset  . Hyperlipidemia Mother   . Dementia Mother   . Obesity Mother   . Hyperlipidemia Father   . Heart disease Father        bypass, CAD  . Sleep apnea Father   . Hyperlipidemia Sister   . Diabetes Sister        type 2  . Colon polyps Sister   . Dementia Maternal Grandmother        alzheimer  . Alcohol abuse Maternal Grandfather   . Diabetes Paternal Grandmother        type 2?  . Colon cancer Neg Hx   . Esophageal cancer Neg Hx   . Rectal cancer Neg Hx   . Stomach cancer Neg Hx     ROS: Review of Systems  Constitutional: Negative for weight loss.  Cardiovascular: Negative for chest pain and claudication.  Gastrointestinal: Negative for diarrhea, nausea and vomiting.       Negative for polyphagia. Negative for hypoglycemia.  Musculoskeletal: Negative for myalgias.       Negative for muscle weakness.    PHYSICAL EXAM: Blood pressure (!) 100/58, pulse (!) 57, temperature 97.6 F (36.4 C), temperature source Oral, height 5\' 5"  (1.651 m), weight 164 lb (74.4 kg), last menstrual period 04/04/2000, SpO2 100 %. Body mass index is 27.29 kg/m. Physical Exam  Constitutional: She is oriented to person, place, and time. She appears well-developed and well-nourished.  Cardiovascular: Normal rate.  Pulmonary/Chest: Effort normal.  Musculoskeletal: Normal range of motion.  Neurological: She is oriented to person, place, and time.  Skin: Skin is  warm and dry.  Psychiatric: She has a normal mood and affect. Her behavior is normal.  Vitals reviewed.   RECENT LABS AND TESTS: BMET    Component Value Date/Time   NA 139 07/31/2017 1411   NA 141 07/20/2017 1229   K 4.0 07/31/2017 1411   CL 104 07/31/2017 1411   CO2 29 07/31/2017 1411   GLUCOSE 81 07/31/2017 1411  BUN 26 (H) 07/31/2017 1411   BUN 29 (H) 07/20/2017 1229   CREATININE 0.67 07/31/2017 1411   CALCIUM 9.7 07/31/2017 1411   GFRNONAA 73 07/20/2017 1229   GFRAA 84 07/20/2017 1229   Lab Results  Component Value Date   HGBA1C 5.7 (H) 07/20/2017   HGBA1C 5.8 (H) 01/10/2017   HGBA1C 6.1 08/05/2016   HGBA1C 5.9 07/06/2015   HGBA1C 5.6 01/02/2015   Lab Results  Component Value Date   INSULIN 9.7 07/20/2017   INSULIN 11.7 01/10/2017   CBC    Component Value Date/Time   WBC 6.2 01/10/2017 0938   WBC 7.4 08/05/2016 0956   RBC 4.51 01/10/2017 0938   RBC 4.56 08/05/2016 0956   HGB 13.2 01/10/2017 0938   HCT 41.3 01/10/2017 0938   PLT 308.0 08/05/2016 0956   MCV 92 01/10/2017 0938   MCH 29.3 01/10/2017 0938   MCHC 32.0 01/10/2017 0938   MCHC 33.6 08/05/2016 0956   RDW 14.0 01/10/2017 0938   LYMPHSABS 2.1 01/10/2017 0938   MONOABS 0.7 08/05/2016 0956   EOSABS 0.2 01/10/2017 0938   BASOSABS 0.1 01/10/2017 0938   Iron/TIBC/Ferritin/ %Sat No results found for: IRON, TIBC, FERRITIN, IRONPCTSAT Lipid Panel     Component Value Date/Time   CHOL 234 (H) 07/20/2017 1229   TRIG 124 07/20/2017 1229   TRIG 94 04/07/2006 1019   HDL 57 07/20/2017 1229   CHOLHDL 4.1 07/20/2017 1229   CHOLHDL 4 08/05/2016 0956   VLDL 20.0 08/05/2016 0956   LDLCALC 152 (H) 07/20/2017 1229   LDLDIRECT 145.4 11/14/2012 0823   Hepatic Function Panel     Component Value Date/Time   PROT 6.9 07/31/2017 1411   PROT 6.7 07/20/2017 1229   ALBUMIN 3.8 07/31/2017 1411   ALBUMIN 3.9 07/20/2017 1229   AST 16 07/31/2017 1411   ALT 14 07/31/2017 1411   ALKPHOS 67 07/31/2017 1411    BILITOT 0.4 07/31/2017 1411   BILITOT <0.2 07/20/2017 1229   BILIDIR 0.1 10/29/2013 0831      Component Value Date/Time   TSH 1.020 01/10/2017 0938   TSH 1.21 08/05/2016 0956   TSH 1.56 07/06/2015 0936   Results for LACINDA, CURVIN (MRN [id number]) as of 11/30/2017 14:07  Ref. Range 07/20/2017 12:29  Vitamin D, 25-Hydroxy Latest Ref Range: 30.0 - 100.0 ng/mL 58.4    ASSESSMENT AND PLAN: Vitamin D deficiency - Plan: VITAMIN D 25 Hydroxy (Vit-D Deficiency, Fractures)  Prediabetes - Plan: Comprehensive metabolic panel, Hemoglobin A1c, Insulin, random  Other hyperlipidemia - Plan: Lipid Panel With LDL/HDL Ratio  Class 1 obesity with serious comorbidity and body mass index (BMI) of 30.0 to 30.9 in adult, unspecified obesity type - Starting BMI greater then 30  PLAN:  Vitamin D Deficiency Cindy [name] was informed that low vitamin D levels contributes to fatigue and are associated with obesity, breast, and colon cancer. She agrees to continue to take OTC Vit D and will follow up for routine testing of vitamin D, at least 2-3 times per year. She was informed of the risk of over-replacement of vitamin D and agrees to not increase her dose unless she discusses this with [name] first. We will check labs today.  Pre-Diabetes Cindy [name] will continue to work on weight loss, exercise, and decreasing simple carbohydrates in her diet to help decrease the risk of diabetes. She was informed that eating too many simple carbohydrates or too many calories at one sitting increases the likelihood of GI side effects. She agrees to continue with  her current medicines, diet, and weight loss. Cindy [name] agreed to follow up with [name] as directed to monitor her progress. We will check labs today.  Hyperlipidemia Cindy [name] was informed of the American Heart Association Guidelines emphasizing intensive lifestyle modifications as the first line treatment for hyperlipidemia. We discussed many lifestyle modifications today in depth, and Cindy [name]  will continue to work on decreasing saturated fats such as fatty red meat, butter and many fried foods. She will also increase vegetables and lean protein in her diet and continue to work on exercise and weight loss efforts. Cindy [name] agrees to continue her pravastatin, diet, and weight loss. She will follow up as directed.  Obesity Cindy [name] is currently in the action stage of change. As such, her goal is to continue with weight loss efforts. She has agreed to follow our protein rich vegetarian plan. Cindy [name] has been instructed to work up to a goal of 150 minutes of combined cardio and strengthening exercise per week for weight loss and overall health benefits. We discussed the following Behavioral Modification Strategies today: work on meal planning and easy cooking plans and planning for success.  Cindy [name] has agreed to follow up with our clinic in 2 to 3 weeks. She was informed of the importance of frequent follow up visits to maximize her success with intensive lifestyle modifications for her multiple health conditions.   OBESITY BEHAVIORAL INTERVENTION VISIT  Today's visit was #19  Starting weight: 185 lbs Starting date: 01/10/17 Today's weight : 164 lb (74.4 kg)  Today's date: 11/30/2017 Total lbs lost to date: 21 At least 15 minutes were spent on discussing the following behavioral intervention visit.   ASK: We discussed the diagnosis of obesity with Cindy [name] today and Cindy [name] agreed to give [name] permission to discuss obesity behavioral modification therapy today.  ASSESS: Cindy [name] has the diagnosis of obesity and her BMI today is 27.29.  Cindy [name] is in the action stage of change.   ADVISE: Cindy [name] was educated on the multiple health risks of obesity as well as the benefit of weight loss to improve her health. She was advised of the need for long term treatment and the importance of lifestyle modifications to improve her current health and to decrease her risk of future health  problems.  AGREE: Multiple dietary modification options and treatment options were discussed and Kashonda agreed to follow the recommendations documented in the above note.  ARRANGE: Naidelin was educated on the importance of frequent visits to treat obesity as outlined per CMS and USPSTF guidelines and agreed to schedule her next follow up appointment today.  Lenward Chancellor, am acting as transcriptionist for Abby Potash, PA-C I, Abby Potash, PA-C have reviewed above note and agree with its content

## 2017-12-01 LAB — LIPID PANEL WITH LDL/HDL RATIO
CHOLESTEROL TOTAL: 238 mg/dL — AB (ref 100–199)
HDL: 57 mg/dL (ref >39)
LDL Calculated: 159 mg/dL — ABNORMAL HIGH (ref 0–99)
LDl/HDL Ratio: 2.8 ratio (ref 0.0–3.2)
Triglycerides: 112 mg/dL (ref 0–149)
VLDL Cholesterol Cal: 22 mg/dL (ref 5–40)

## 2017-12-01 LAB — COMPREHENSIVE METABOLIC PANEL
ALK PHOS: 81 IU/L (ref 39–117)
ALT: 15 IU/L (ref 0–32)
AST: 15 IU/L (ref 0–40)
Albumin/Globulin Ratio: 1.7 (ref 1.2–2.2)
Albumin: 4.1 g/dL (ref 3.5–4.8)
BUN / CREAT RATIO: 38 — AB (ref 12–28)
BUN: 26 mg/dL (ref 8–27)
Bilirubin Total: 0.4 mg/dL (ref 0.0–1.2)
CHLORIDE: 101 mmol/L (ref 96–106)
CO2: 22 mmol/L (ref 20–29)
CREATININE: 0.69 mg/dL (ref 0.57–1.00)
Calcium: 9 mg/dL (ref 8.7–10.3)
GFR calc Af Amer: 102 mL/min/{1.73_m2} (ref >59)
GFR, EST NON AFRICAN AMERICAN: 88 mL/min/{1.73_m2} (ref >59)
GLOBULIN, TOTAL: 2.4 g/dL (ref 1.5–4.5)
GLUCOSE: 91 mg/dL (ref 65–99)
Potassium: 4.5 mmol/L (ref 3.5–5.2)
Sodium: 141 mmol/L (ref 134–144)
Total Protein: 6.5 g/dL (ref 6.0–8.5)

## 2017-12-01 LAB — HEMOGLOBIN A1C
Est. average glucose Bld gHb Est-mCnc: 120 mg/dL
HEMOGLOBIN A1C: 5.8 % — AB (ref 4.8–5.6)

## 2017-12-01 LAB — VITAMIN D 25 HYDROXY (VIT D DEFICIENCY, FRACTURES): Vit D, 25-Hydroxy: 60.8 ng/mL (ref 30.0–100.0)

## 2017-12-01 LAB — INSULIN, RANDOM: INSULIN: 9.6 u[IU]/mL (ref 2.6–24.9)

## 2017-12-07 ENCOUNTER — Other Ambulatory Visit: Payer: Self-pay | Admitting: Family Medicine

## 2017-12-15 ENCOUNTER — Other Ambulatory Visit (INDEPENDENT_AMBULATORY_CARE_PROVIDER_SITE_OTHER): Payer: Self-pay | Admitting: Family Medicine

## 2017-12-15 DIAGNOSIS — R7303 Prediabetes: Secondary | ICD-10-CM

## 2017-12-21 ENCOUNTER — Telehealth: Payer: Self-pay

## 2017-12-21 ENCOUNTER — Ambulatory Visit (INDEPENDENT_AMBULATORY_CARE_PROVIDER_SITE_OTHER): Payer: Medicare HMO | Admitting: Physician Assistant

## 2017-12-21 VITALS — BP 124/74 | HR 56 | Temp 97.6°F | Ht 65.0 in | Wt 165.0 lb

## 2017-12-21 DIAGNOSIS — R7303 Prediabetes: Secondary | ICD-10-CM | POA: Diagnosis not present

## 2017-12-21 DIAGNOSIS — Z6830 Body mass index (BMI) 30.0-30.9, adult: Secondary | ICD-10-CM

## 2017-12-21 DIAGNOSIS — E669 Obesity, unspecified: Secondary | ICD-10-CM

## 2017-12-21 NOTE — Telephone Encounter (Signed)
Copied from [place] [id number]. Topic: Quick Communication - See Telephone Encounter >> Dec 21, 2017 11:36 AM Antonieta Iba C wrote: CRM for notification. See Telephone encounter for: 12/21/17.  Pt called in because she had labs completed at Dr. Leafy Ro office. Pt would like to be advised based on the results on if provider think that she should continue with the current dose of pravastatin (PRAVACHOL) 80 MG tablet? Please advise.

## 2017-12-21 NOTE — Telephone Encounter (Signed)
Pravastatin not working well enough. She should switch to rosuvastatin 40 mg tabs q tab po qhs, this is stronger. Then she should repeat lipid and cmp in 3 months with appt.

## 2017-12-22 NOTE — Telephone Encounter (Signed)
LMOM informing Pt to return call. Okay for PEC to discuss.  

## 2017-12-25 NOTE — Telephone Encounter (Signed)
° °  Pt said she has been taking 40 mg pravastatin for at least 8 months because this is what Dr Gracelyn Nurse has instructed her to do because of her  liver test.  Pt is asking since her most recent lab work showed   liver test is good and   Cholesterol is not she is asking if she need to start back on the 80mg  of PRAVASTATIN

## 2017-12-25 NOTE — Progress Notes (Signed)
Office: [phone]  /  Fax: [phone]   HPI:   Chief Complaint: OBESITY Cindy [name] is here to discuss her progress with her obesity treatment plan. She is on the Pescatarian eating plan and is following her eating plan approximately 100 % of the time. She states she is doing zumba for 45 minutes, walking for 90 minutes, and bike riding for 30 minutes 3 times per week. Cindy [name] reports that she indulged in pizza and wine recently. She enjoys the Pescatatrian plan and wants to continue. She will be traveling to [place] in a few weeks for the birth of her grandbaby. Her weight is 165 lb (74.8 kg) today and has gained 1 pound since her last visit. She has lost 6 lbs since starting treatment with [name].  Pre-Diabetes Cindy [name] has a diagnosis of pre-diabetes based on her elevated Hgb A1c and was informed this puts her at greater risk of developing diabetes. She is on metformin and denies nausea, vomiting, or diarrhea. She continues to work on diet and exercise to decrease risk of diabetes. She denies polyphagia or hypoglycemia.  ALLERGIES: Allergies  Allergen Reactions  . Codeine Other (See Comments)    hallucinations  . Crestor [Rosuvastatin]     REACTION: myalgias  . Other Other (See Comments)    NOVACAINE- blisters left side of mouth after receiving   . Amoxicillin Rash  . Erythromycin Other (See Comments)    cramps  . Sulfa Antibiotics Rash  . Sulfonamide Derivatives Rash    MEDICATIONS: Current Outpatient Medications on File Prior to Visit  Medication Sig Dispense Refill  . aspirin 81 MG tablet Take 81 mg by mouth daily.    . cholecalciferol (VITAMIN D) 1000 units tablet Take 2,000 Units by mouth daily.     . cycloSPORINE (RESTASIS) 0.05 % ophthalmic emulsion Place 1 drop into both eyes daily as needed. Reported on 07/09/2015    . DULoxetine (CYMBALTA) 30 MG capsule TAKE ONE CAPSULE BY MOUTH EVERY OTHER DAY 30 capsule 0  . fluticasone (FLONASE) 50 MCG/ACT nasal spray Place 1 spray  into both nostrils as needed.     Marland Kitchen levocetirizine (XYZAL) 5 MG tablet Take 5 mg by mouth as needed.   3  . metFORMIN (GLUCOPHAGE) 500 MG tablet TAKE ONE TABLET BY MOUTH TWICE A DAY WITH A MEAL 60 tablet 0  . pravastatin (PRAVACHOL) 80 MG tablet TAKE ONE-HALF (1/2) TABLET BY MOUTH DAILY 45 tablet 1  . Turmeric 500 MG CAPS Take by mouth.     No current facility-administered medications on file prior to visit.     PAST MEDICAL HISTORY: Past Medical History:  Diagnosis Date  . Abnormal liver function tests 07/19/2015  . Allergic state 02/01/2010   Qualifier: Diagnosis of  By: Regis Bill MD, Standley Brooking   . Allergy   . Anxiety   . Back pain   . Chicken pox 72 yrs old  . Depressive disorder, not elsewhere classified   . Diabetes ([place])    diet controlled- no medicines   . Fibroid   . History of fractured kneecap    right   . Hyperglycemia   . Hyperlipidemia   . Infertility, female   . Joint pain   . Knee pain, left 10/05/2011   Follows with Dr Margarette Canada  . Measles as a child  . Medicare annual wellness visit, subsequent 07/06/2014  . OA (osteoarthritis) of knee    left knee  . Obesity 10/05/2011  . Obesity 10/05/2011  . Osteoarthritis   .  Osteopenia 06/26/2014  . Other and unspecified hyperlipidemia   . Overweight 10/05/2011  . Overweight(278.02) 10/05/2011  . Sleep apnea     On CPAP  . Sleep apnea   . Stye 11/19/2012  . Syncope 01/26/2017  . Thyroid disease    h/o hyperthyroid treated with radioactive iodine? from 16 to 20  . Type 2 diabetes mellitus with hyperlipidemia ([place])   . Varicose vein of leg     PAST SURGICAL HISTORY: Past Surgical History:  Procedure Laterality Date  . BACK SURGERY     L5 discectomy, 2002. helpful  . CERVICAL BIOPSY  W/ LOOP ELECTRODE EXCISION    . CESAREAN SECTION  1987  . COLONOSCOPY    . CPAP    . KNEE SURGERY Right 2006   R knee fx. patella  . LEEP    . stitches in foot Right     SOCIAL HISTORY: Social History   Tobacco Use  . Smoking status:  Never Smoker  . Smokeless tobacco: Never Used  Substance Use Topics  . Alcohol use: Yes    Alcohol/week: 1.0 standard drinks    Types: 1 Glasses of wine per week  . Drug use: No    FAMILY HISTORY: Family History  Problem Relation Age of Onset  . Hyperlipidemia Mother   . Dementia Mother   . Obesity Mother   . Hyperlipidemia Father   . Heart disease Father        bypass, CAD  . Sleep apnea Father   . Hyperlipidemia Sister   . Diabetes Sister        type 2  . Colon polyps Sister   . Dementia Maternal Grandmother        alzheimer  . Alcohol abuse Maternal Grandfather   . Diabetes Paternal Grandmother        type 2?  . Colon cancer Neg Hx   . Esophageal cancer Neg Hx   . Rectal cancer Neg Hx   . Stomach cancer Neg Hx     ROS: Review of Systems  Constitutional: Negative for weight loss.  Gastrointestinal: Negative for diarrhea, nausea and vomiting.  Endo/Heme/Allergies:       Negative polyphagia Negative hypoglycemia    PHYSICAL EXAM: Blood pressure 124/74, pulse (!) 56, temperature 97.6 F (36.4 C), temperature source Oral, height 5\' 5"  (1.651 m), weight 165 lb (74.8 kg), last menstrual period 04/04/2000, SpO2 100 %. Body mass index is 27.46 kg/m. Physical Exam  Constitutional: She is oriented to person, place, and time. She appears well-developed and well-nourished.  Cardiovascular: Normal rate.  Pulmonary/Chest: Effort normal.  Musculoskeletal: Normal range of motion.  Neurological: She is oriented to person, place, and time.  Skin: Skin is warm and dry.  Psychiatric: She has a normal mood and affect. Her behavior is normal.  Vitals reviewed.   RECENT LABS AND TESTS: BMET    Component Value Date/Time   NA 141 11/30/2017 0944   K 4.5 11/30/2017 0944   CL 101 11/30/2017 0944   CO2 22 11/30/2017 0944   GLUCOSE 91 11/30/2017 0944   GLUCOSE 81 07/31/2017 1411   BUN 26 11/30/2017 0944   CREATININE 0.69 11/30/2017 0944   CALCIUM 9.0 11/30/2017 0944    GFRNONAA 88 11/30/2017 0944   GFRAA 102 11/30/2017 0944   Lab Results  Component Value Date   HGBA1C 5.8 (H) 11/30/2017   HGBA1C 5.7 (H) 07/20/2017   HGBA1C 5.8 (H) 01/10/2017   HGBA1C 6.1 08/05/2016   HGBA1C 5.9 07/06/2015  Lab Results  Component Value Date   INSULIN 9.6 11/30/2017   INSULIN 9.7 07/20/2017   INSULIN 11.7 01/10/2017   CBC    Component Value Date/Time   WBC 6.2 01/10/2017 0938   WBC 7.4 08/05/2016 0956   RBC 4.51 01/10/2017 0938   RBC 4.56 08/05/2016 0956   HGB 13.2 01/10/2017 0938   HCT 41.3 01/10/2017 0938   PLT 308.0 08/05/2016 0956   MCV 92 01/10/2017 0938   MCH 29.3 01/10/2017 0938   MCHC 32.0 01/10/2017 0938   MCHC 33.6 08/05/2016 0956   RDW 14.0 01/10/2017 0938   LYMPHSABS 2.1 01/10/2017 0938   MONOABS 0.7 08/05/2016 0956   EOSABS 0.2 01/10/2017 0938   BASOSABS 0.1 01/10/2017 0938   Iron/TIBC/Ferritin/ %Sat No results found for: IRON, TIBC, FERRITIN, IRONPCTSAT Lipid Panel     Component Value Date/Time   CHOL 238 (H) 11/30/2017 0944   TRIG 112 11/30/2017 0944   TRIG 94 04/07/2006 1019   HDL 57 11/30/2017 0944   CHOLHDL 4.1 07/20/2017 1229   CHOLHDL 4 08/05/2016 0956   VLDL 20.0 08/05/2016 0956   LDLCALC 159 (H) 11/30/2017 0944   LDLDIRECT 145.4 11/14/2012 0823   Hepatic Function Panel     Component Value Date/Time   PROT 6.5 11/30/2017 0944   ALBUMIN 4.1 11/30/2017 0944   AST 15 11/30/2017 0944   ALT 15 11/30/2017 0944   ALKPHOS 81 11/30/2017 0944   BILITOT 0.4 11/30/2017 0944   BILIDIR 0.1 10/29/2013 0831      Component Value Date/Time   TSH 1.020 01/10/2017 0938   TSH 1.21 08/05/2016 0956   TSH 1.56 07/06/2015 0936    ASSESSMENT AND PLAN: Prediabetes  Class 1 obesity with serious comorbidity and body mass index (BMI) of 30.0 to 30.9 in adult, unspecified obesity type - Starting BMI greater then 30  PLAN:  Pre-Diabetes Cindy [name] will continue to work on weight loss, diet, exercise, and decreasing simple carbohydrates  in her diet to help decrease the risk of diabetes. We dicussed metformin including benefits and risks. She was informed that eating too many simple carbohydrates or too many calories at one sitting increases the likelihood of GI side effects. Cindy [name] agrees to continue taking metformin and she agrees to follow up with our clinic in 2 weeks as directed to monitor her progress.  I spent > than 50% of the 15 minute visit on counseling as documented in the note.  Obesity Cindy [name] is currently in the action stage of change. As such, her goal is to continue with weight loss efforts She has agreed to follow the Pescatarian eating plan Cindy [name] has been instructed to work up to a goal of 150 minutes of combined cardio and strengthening exercise per week for weight loss and overall health benefits. We discussed the following Behavioral Modification Strategies today: work on meal planning and easy cooking plans and ways to avoid boredom eating   Cindy [name] has agreed to follow up with our clinic in 2 weeks. She was informed of the importance of frequent follow up visits to maximize her success with intensive lifestyle modifications for her multiple health conditions.   OBESITY BEHAVIORAL INTERVENTION VISIT  Today's visit was # 17  Starting weight: 171 lbs Starting date: 03/06/17 Today's weight :165 lbs Today's date: 12/21/2017 Total lbs lost to date: 6    ASK: We discussed the diagnosis of obesity with Cindy [name] today and Cindy [name] agreed to give [name] permission to discuss obesity behavioral modification therapy today.  ASSESS: Cindy [name] has the diagnosis of obesity and her BMI today is 27.46 Cindy [name] is in the action stage of change   ADVISE: Cindy [name] was educated on the multiple health risks of obesity as well as the benefit of weight loss to improve her health. She was advised of the need for long term treatment and the importance of lifestyle modifications.  AGREE: Multiple dietary modification options and  treatment options were discussed and  Cindy [name] agreed to the above obesity treatment plan.  Cindy [name], am acting as transcriptionist for Abby Potash, PA-C I, Abby Potash, PA-C have reviewed above note and agree with its content

## 2018-01-04 ENCOUNTER — Ambulatory Visit (INDEPENDENT_AMBULATORY_CARE_PROVIDER_SITE_OTHER): Payer: Medicare HMO | Admitting: Physician Assistant

## 2018-01-18 ENCOUNTER — Ambulatory Visit (INDEPENDENT_AMBULATORY_CARE_PROVIDER_SITE_OTHER): Payer: Medicare HMO | Admitting: Physician Assistant

## 2018-01-18 ENCOUNTER — Encounter (INDEPENDENT_AMBULATORY_CARE_PROVIDER_SITE_OTHER): Payer: Self-pay | Admitting: Physician Assistant

## 2018-01-18 VITALS — BP 115/71 | HR 56 | Temp 98.2°F | Ht 65.0 in | Wt 171.0 lb

## 2018-01-18 DIAGNOSIS — E669 Obesity, unspecified: Secondary | ICD-10-CM | POA: Diagnosis not present

## 2018-01-18 DIAGNOSIS — R7303 Prediabetes: Secondary | ICD-10-CM | POA: Diagnosis not present

## 2018-01-18 DIAGNOSIS — Z6830 Body mass index (BMI) 30.0-30.9, adult: Secondary | ICD-10-CM

## 2018-01-20 ENCOUNTER — Other Ambulatory Visit (INDEPENDENT_AMBULATORY_CARE_PROVIDER_SITE_OTHER): Payer: Self-pay | Admitting: Family Medicine

## 2018-01-20 DIAGNOSIS — R7303 Prediabetes: Secondary | ICD-10-CM

## 2018-01-23 NOTE — Progress Notes (Signed)
Office: [phone]  /  Fax: [phone]   HPI:   Chief Complaint: OBESITY Cindy [name] is here to discuss her progress with her obesity treatment plan. She is on the Pescatarian eating plan and is following her eating plan approximately 0 % of the time. She states she is exercising 0 minutes 0 times per week. [name] struggled to follow the Pescatarian plan due to stomach upset while traveling. She reports diarrhea after eating simple carbohydrates and she was eating 3 to 4 protein bars a day. She is ready to get back on track. Her weight is 171 lb (77.6 kg) today and has had a weight gain of 6 pounds over a period of 4 weeks since her last visit. She has lost 14 lbs since starting treatment with [name].  Pre-Diabetes Cindy [name] has a diagnosis of prediabetes based on her elevated Hgb A1c and was informed this puts her at greater risk of developing diabetes. She stopped taking metformin recently due to nausea and diarrhea while traveling. Cindy [name] continues to work on diet and exercise to decrease risk of diabetes. She denies polyphagia or hypoglycemia.  ALLERGIES: Allergies  Allergen Reactions  . Codeine Other (See Comments)    hallucinations  . Crestor [Rosuvastatin]     REACTION: myalgias  . Other Other (See Comments)    NOVACAINE- blisters left side of mouth after receiving   . Amoxicillin Rash  . Erythromycin Other (See Comments)    cramps  . Sulfa Antibiotics Rash  . Sulfonamide Derivatives Rash    MEDICATIONS: Current Outpatient Medications on File Prior to Visit  Medication Sig Dispense Refill  . aspirin 81 MG tablet Take 81 mg by mouth daily.    . cholecalciferol (VITAMIN D) 1000 units tablet Take 2,000 Units by mouth daily.     . cycloSPORINE (RESTASIS) 0.05 % ophthalmic emulsion Place 1 drop into both eyes daily as needed. Reported on 07/09/2015    . DULoxetine (CYMBALTA) 30 MG capsule TAKE ONE CAPSULE BY MOUTH EVERY OTHER DAY 30 capsule 0  . fluticasone (FLONASE) 50 MCG/ACT nasal  spray Place 1 spray into both nostrils as needed.     Marland Kitchen levocetirizine (XYZAL) 5 MG tablet Take 5 mg by mouth as needed.   3  . metFORMIN (GLUCOPHAGE) 500 MG tablet TAKE ONE TABLET BY MOUTH TWICE A DAY WITH A MEAL (Patient taking differently: Take 500 mg by mouth daily with breakfast. TAKE ONE TABLET BY MOUTH TWICE A DAY WITH A MEAL) 60 tablet 0  . pravastatin (PRAVACHOL) 80 MG tablet TAKE ONE-HALF (1/2) TABLET BY MOUTH DAILY 45 tablet 1  . Turmeric 500 MG CAPS Take by mouth.     No current facility-administered medications on file prior to visit.     PAST MEDICAL HISTORY: Past Medical History:  Diagnosis Date  . Abnormal liver function tests 07/19/2015  . Allergic state 02/01/2010   Qualifier: Diagnosis of  By: Regis Bill MD, Standley Brooking   . Allergy   . Anxiety   . Back pain   . Chicken pox 72 yrs old  . Depressive disorder, not elsewhere classified   . Diabetes ([place])    diet controlled- no medicines   . Fibroid   . History of fractured kneecap    right   . Hyperglycemia   . Hyperlipidemia   . Infertility, female   . Joint pain   . Knee pain, left 10/05/2011   Follows with Dr Margarette Canada  . Measles as a child  . Medicare annual wellness visit, subsequent  07/06/2014  . OA (osteoarthritis) of knee    left knee  . Obesity 10/05/2011  . Obesity 10/05/2011  . Osteoarthritis   . Osteopenia 06/26/2014  . Other and unspecified hyperlipidemia   . Overweight 10/05/2011  . Overweight(278.02) 10/05/2011  . Sleep apnea     On CPAP  . Sleep apnea   . Stye 11/19/2012  . Syncope 01/26/2017  . Thyroid disease    h/o hyperthyroid treated with radioactive iodine? from 16 to 20  . Type 2 diabetes mellitus with hyperlipidemia ([place])   . Varicose vein of leg     PAST SURGICAL HISTORY: Past Surgical History:  Procedure Laterality Date  . BACK SURGERY     L5 discectomy, 2002. helpful  . CERVICAL BIOPSY  W/ LOOP ELECTRODE EXCISION    . CESAREAN SECTION  1987  . COLONOSCOPY    . CPAP    . KNEE SURGERY Right  2006   R knee fx. patella  . LEEP    . stitches in foot Right     SOCIAL HISTORY: Social History   Tobacco Use  . Smoking status: Never Smoker  . Smokeless tobacco: Never Used  Substance Use Topics  . Alcohol use: Yes    Alcohol/week: 1.0 standard drinks    Types: 1 Glasses of wine per week  . Drug use: No    FAMILY HISTORY: Family History  Problem Relation Age of Onset  . Hyperlipidemia Mother   . Dementia Mother   . Obesity Mother   . Hyperlipidemia Father   . Heart disease Father        bypass, CAD  . Sleep apnea Father   . Hyperlipidemia Sister   . Diabetes Sister        type 2  . Colon polyps Sister   . Dementia Maternal Grandmother        alzheimer  . Alcohol abuse Maternal Grandfather   . Diabetes Paternal Grandmother        type 2?  . Colon cancer Neg Hx   . Esophageal cancer Neg Hx   . Rectal cancer Neg Hx   . Stomach cancer Neg Hx     ROS: Review of Systems  Constitutional: Negative for weight loss.  Gastrointestinal: Positive for diarrhea and nausea.  Endo/Heme/Allergies:       Negative for polyphagia Negative for hypoglycemia    PHYSICAL EXAM: Blood pressure 115/71, pulse (!) 56, temperature 98.2 F (36.8 C), temperature source Oral, height 5\' 5"  (1.651 m), weight 171 lb (77.6 kg), last menstrual period 04/04/2000, SpO2 92 %. Body mass index is 28.46 kg/m. Physical Exam  Constitutional: She is oriented to person, place, and time. She appears well-developed and well-nourished.  Cardiovascular: Normal rate.  Pulmonary/Chest: Effort normal.  Musculoskeletal: Normal range of motion.  Neurological: She is oriented to person, place, and time.  Skin: Skin is warm and dry.  Psychiatric: She has a normal mood and affect. Her behavior is normal.  Vitals reviewed.   RECENT LABS AND TESTS: BMET    Component Value Date/Time   NA 141 11/30/2017 0944   K 4.5 11/30/2017 0944   CL 101 11/30/2017 0944   CO2 22 11/30/2017 0944   GLUCOSE 91  11/30/2017 0944   GLUCOSE 81 07/31/2017 1411   BUN 26 11/30/2017 0944   CREATININE 0.69 11/30/2017 0944   CALCIUM 9.0 11/30/2017 0944   GFRNONAA 88 11/30/2017 0944   GFRAA 102 11/30/2017 0944   Lab Results  Component Value Date  HGBA1C 5.8 (H) 11/30/2017   HGBA1C 5.7 (H) 07/20/2017   HGBA1C 5.8 (H) 01/10/2017   HGBA1C 6.1 08/05/2016   HGBA1C 5.9 07/06/2015   Lab Results  Component Value Date   INSULIN 9.6 11/30/2017   INSULIN 9.7 07/20/2017   INSULIN 11.7 01/10/2017   CBC    Component Value Date/Time   WBC 6.2 01/10/2017 0938   WBC 7.4 08/05/2016 0956   RBC 4.51 01/10/2017 0938   RBC 4.56 08/05/2016 0956   HGB 13.2 01/10/2017 0938   HCT 41.3 01/10/2017 0938   PLT 308.0 08/05/2016 0956   MCV 92 01/10/2017 0938   MCH 29.3 01/10/2017 0938   MCHC 32.0 01/10/2017 0938   MCHC 33.6 08/05/2016 0956   RDW 14.0 01/10/2017 0938   LYMPHSABS 2.1 01/10/2017 0938   MONOABS 0.7 08/05/2016 0956   EOSABS 0.2 01/10/2017 0938   BASOSABS 0.1 01/10/2017 0938   Iron/TIBC/Ferritin/ %Sat No results found for: IRON, TIBC, FERRITIN, IRONPCTSAT Lipid Panel     Component Value Date/Time   CHOL 238 (H) 11/30/2017 0944   TRIG 112 11/30/2017 0944   TRIG 94 04/07/2006 1019   HDL 57 11/30/2017 0944   CHOLHDL 4.1 07/20/2017 1229   CHOLHDL 4 08/05/2016 0956   VLDL 20.0 08/05/2016 0956   LDLCALC 159 (H) 11/30/2017 0944   LDLDIRECT 145.4 11/14/2012 0823   Hepatic Function Panel     Component Value Date/Time   PROT 6.5 11/30/2017 0944   ALBUMIN 4.1 11/30/2017 0944   AST 15 11/30/2017 0944   ALT 15 11/30/2017 0944   ALKPHOS 81 11/30/2017 0944   BILITOT 0.4 11/30/2017 0944   BILIDIR 0.1 10/29/2013 0831      Component Value Date/Time   TSH 1.020 01/10/2017 0938   TSH 1.21 08/05/2016 0956   TSH 1.56 07/06/2015 0936   Results for KELLAN, BOEHLKE (MRN [id number]) as of 01/23/2018 09:01  Ref. Range 11/30/2017 09:44  Vitamin D, 25-Hydroxy Latest Ref Range: 30.0 - 100.0 ng/mL 60.8    ASSESSMENT AND PLAN: Prediabetes  Class 1 obesity with serious comorbidity and body mass index (BMI) of 30.0 to 30.9 in adult, unspecified obesity type - Starting BMI greater then 30  PLAN:  Pre-Diabetes Caralynn will continue to work on weight loss, exercise, and decreasing simple carbohydrates in her diet to help decrease the risk of diabetes. We dicussed metformin including benefits and risks. She was informed that eating too many simple carbohydrates or too many calories at one sitting increases the likelihood of GI side effects. Anet agrees to restart metformin tomorrow and follow up with [name] as directed to monitor her progress.  Obesity Xanthe is currently in the action stage of change. As such, her goal is to continue with weight loss efforts She has agreed to follow the Pescatarian eating plan Jaleisa has been instructed to work up to a goal of 150 minutes of combined cardio and strengthening exercise per week for weight loss and overall health benefits. We discussed the following Behavioral Modification Strategies today: increasing lean protein intake and no skipping meals  Kimra has agreed to follow up with our clinic in 2 weeks. She was informed of the importance of frequent follow up visits to maximize her success with intensive lifestyle modifications for her multiple health conditions.   OBESITY BEHAVIORAL INTERVENTION VISIT  Today's visit was # 21   Starting weight: 185 lbs Starting date: 01/10/17 Today's weight : 171 lbs Today's date: 01/18/2018 Total lbs lost to date: 14 At least 15 minutes  were spent on discussing the following behavioral intervention visit.   ASK: We discussed the diagnosis of obesity with Lurlean Leyden today and Kaydince agreed to give [name] permission to discuss obesity behavioral modification therapy today.  ASSESS: Fairy has the diagnosis of obesity and her BMI today is 28.46 Vieno is in the action stage of change   ADVISE: Lainie was educated on  the multiple health risks of obesity as well as the benefit of weight loss to improve her health. She was advised of the need for long term treatment and the importance of lifestyle modifications to improve her current health and to decrease her risk of future health problems.  AGREE: Multiple dietary modification options and treatment options were discussed and  Elsia agreed to follow the recommendations documented in the above note.  ARRANGE: Jamae was educated on the importance of frequent visits to treat obesity as outlined per CMS and USPSTF guidelines and agreed to schedule her next follow up appointment today.  Corey Skains, am acting as transcriptionist for Abby Potash, PA-C I, Abby Potash, PA-C have reviewed above note and agree with its content

## 2018-01-28 ENCOUNTER — Other Ambulatory Visit: Payer: Self-pay | Admitting: Family Medicine

## 2018-01-29 ENCOUNTER — Encounter: Payer: Self-pay | Admitting: Family Medicine

## 2018-01-29 ENCOUNTER — Telehealth: Payer: Self-pay | Admitting: Family Medicine

## 2018-01-29 NOTE — Telephone Encounter (Signed)
Copied from [place] [phone]. Topic: Quick Communication - Rx Refill/Question >> Jan 29, 2018 12:16 PM Reyne Dumas L wrote: Medication:  pravastatin (PRAVACHOL) 80 MG tablet  Pt states this was lowered from taking 1 pill to taking 1/2 pill.  Pt states she just had blood work done by Dr. Leafy Ro that shows cholesterol still high but liver functions are fine.  Pt wants to know if she should go back to taking full pill. Pt can be reached at [phone].

## 2018-01-29 NOTE — Telephone Encounter (Signed)
Please advise 

## 2018-01-29 NOTE — Telephone Encounter (Signed)
She should take the whole pill again

## 2018-02-01 ENCOUNTER — Encounter (INDEPENDENT_AMBULATORY_CARE_PROVIDER_SITE_OTHER): Payer: Self-pay | Admitting: Physician Assistant

## 2018-02-01 ENCOUNTER — Ambulatory Visit (INDEPENDENT_AMBULATORY_CARE_PROVIDER_SITE_OTHER): Payer: Medicare HMO | Admitting: Physician Assistant

## 2018-02-01 VITALS — BP 106/53 | HR 66 | Temp 97.9°F | Ht 65.0 in | Wt 168.0 lb

## 2018-02-01 DIAGNOSIS — E7849 Other hyperlipidemia: Secondary | ICD-10-CM | POA: Diagnosis not present

## 2018-02-01 DIAGNOSIS — Z6830 Body mass index (BMI) 30.0-30.9, adult: Secondary | ICD-10-CM | POA: Diagnosis not present

## 2018-02-01 DIAGNOSIS — E669 Obesity, unspecified: Secondary | ICD-10-CM | POA: Diagnosis not present

## 2018-02-03 NOTE — Progress Notes (Signed)
Office: [phone]  /  Fax: [phone]   HPI:   Chief Complaint: OBESITY Cindy [name] is here to discuss her progress with her obesity treatment plan. She is on the Pescatarian eating plan and is following her eating plan approximately 60 % of the time. She states she is trekking for 90 minutes 2 times per week. Teressa did very well with weight loss. She states that she has been at home and not traveling, so she has followed the plan more closely. She states that she is traveling to [place] in a few weeks and is also going to be involved in more social activities.  Her weight is 168 lb (76.2 kg) today and has had a weight loss of 3 pounds over a period of 2 weeks since her last visit. She has lost 17 lbs since starting treatment with [name].  Hyperlipidemia Chassie has hyperlipidemia and has been trying to improve her cholesterol levels with intensive lifestyle modification including a low saturated fat diet, exercise and weight loss. She is on pravastatin, last levels not at goal, and she denies any chest pain, claudication or myalgias.  ALLERGIES: Allergies  Allergen Reactions  . Codeine Other (See Comments)    hallucinations  . Crestor [Rosuvastatin]     REACTION: myalgias  . Other Other (See Comments)    NOVACAINE- blisters left side of mouth after receiving   . Amoxicillin Rash  . Erythromycin Other (See Comments)    cramps  . Sulfa Antibiotics Rash  . Sulfonamide Derivatives Rash    MEDICATIONS: Current Outpatient Medications on File Prior to Visit  Medication Sig Dispense Refill  . aspirin 81 MG tablet Take 81 mg by mouth daily.    . cholecalciferol (VITAMIN D) 1000 units tablet Take 2,000 Units by mouth daily.     . cycloSPORINE (RESTASIS) 0.05 % ophthalmic emulsion Place 1 drop into both eyes daily as needed. Reported on 07/09/2015    . DULoxetine (CYMBALTA) 30 MG capsule TAKE ONE CAPSULE BY MOUTH EVERY OTHER DAY 30 capsule 0  . fluticasone (FLONASE) 50 MCG/ACT nasal spray Place  1 spray into both nostrils as needed.     Marland Kitchen levocetirizine (XYZAL) 5 MG tablet Take 5 mg by mouth as needed.   3  . metFORMIN (GLUCOPHAGE) 500 MG tablet TAKE 1 TABLET TWICE A DAY WITH A MEAL 60 tablet 0  . pravastatin (PRAVACHOL) 80 MG tablet TAKE ONE-HALF (1/2) TABLET BY MOUTH DAILY 45 tablet 1  . Turmeric 500 MG CAPS Take by mouth.     No current facility-administered medications on file prior to visit.     PAST MEDICAL HISTORY: Past Medical History:  Diagnosis Date  . Abnormal liver function tests 07/19/2015  . Allergic state 02/01/2010   Qualifier: Diagnosis of  By: Regis Bill MD, Standley Brooking   . Allergy   . Anxiety   . Back pain   . Chicken pox 72 yrs old  . Depressive disorder, not elsewhere classified   . Diabetes ([place])    diet controlled- no medicines   . Fibroid   . History of fractured kneecap    right   . Hyperglycemia   . Hyperlipidemia   . Infertility, female   . Joint pain   . Knee pain, left 10/05/2011   Follows with Dr Margarette Canada  . Measles as a child  . Medicare annual wellness visit, subsequent 07/06/2014  . OA (osteoarthritis) of knee    left knee  . Obesity 10/05/2011  . Obesity 10/05/2011  .  Osteoarthritis   . Osteopenia 06/26/2014  . Other and unspecified hyperlipidemia   . Overweight 10/05/2011  . Overweight(278.02) 10/05/2011  . Sleep apnea     On CPAP  . Sleep apnea   . Stye 11/19/2012  . Syncope 01/26/2017  . Thyroid disease    h/o hyperthyroid treated with radioactive iodine? from 16 to 20  . Type 2 diabetes mellitus with hyperlipidemia ([place])   . Varicose vein of leg     PAST SURGICAL HISTORY: Past Surgical History:  Procedure Laterality Date  . BACK SURGERY     L5 discectomy, 2002. helpful  . CERVICAL BIOPSY  W/ LOOP ELECTRODE EXCISION    . CESAREAN SECTION  1987  . COLONOSCOPY    . CPAP    . KNEE SURGERY Right 2006   R knee fx. patella  . LEEP    . stitches in foot Right     SOCIAL HISTORY: Social History   Tobacco Use  . Smoking status:  Never Smoker  . Smokeless tobacco: Never Used  Substance Use Topics  . Alcohol use: Yes    Alcohol/week: 1.0 standard drinks    Types: 1 Glasses of wine per week  . Drug use: No    FAMILY HISTORY: Family History  Problem Relation Age of Onset  . Hyperlipidemia Mother   . Dementia Mother   . Obesity Mother   . Hyperlipidemia Father   . Heart disease Father        bypass, CAD  . Sleep apnea Father   . Hyperlipidemia Sister   . Diabetes Sister        type 2  . Colon polyps Sister   . Dementia Maternal Grandmother        alzheimer  . Alcohol abuse Maternal Grandfather   . Diabetes Paternal Grandmother        type 2?  . Colon cancer Neg Hx   . Esophageal cancer Neg Hx   . Rectal cancer Neg Hx   . Stomach cancer Neg Hx     ROS: Review of Systems  Constitutional: Positive for weight loss.  Cardiovascular: Negative for chest pain and claudication.  Musculoskeletal: Negative for myalgias.    PHYSICAL EXAM: Blood pressure (!) 106/53, pulse 66, temperature 97.9 F (36.6 C), temperature source Oral, height 5\' 5"  (1.651 m), weight 168 lb (76.2 kg), last menstrual period 04/04/2000, SpO2 99 %. Body mass index is 27.96 kg/m. Physical Exam  Constitutional: She is oriented to person, place, and time. She appears well-developed and well-nourished.  Cardiovascular: Normal rate.  Pulmonary/Chest: Effort normal.  Musculoskeletal: Normal range of motion.  Neurological: She is oriented to person, place, and time.  Skin: Skin is warm and dry.  Psychiatric: She has a normal mood and affect. Her behavior is normal.  Vitals reviewed.   RECENT LABS AND TESTS: BMET    Component Value Date/Time   NA 141 11/30/2017 0944   K 4.5 11/30/2017 0944   CL 101 11/30/2017 0944   CO2 22 11/30/2017 0944   GLUCOSE 91 11/30/2017 0944   GLUCOSE 81 07/31/2017 1411   BUN 26 11/30/2017 0944   CREATININE 0.69 11/30/2017 0944   CALCIUM 9.0 11/30/2017 0944   GFRNONAA 88 11/30/2017 0944   GFRAA  102 11/30/2017 0944   Lab Results  Component Value Date   HGBA1C 5.8 (H) 11/30/2017   HGBA1C 5.7 (H) 07/20/2017   HGBA1C 5.8 (H) 01/10/2017   HGBA1C 6.1 08/05/2016   HGBA1C 5.9 07/06/2015   Lab Results  Component Value Date   INSULIN 9.6 11/30/2017   INSULIN 9.7 07/20/2017   INSULIN 11.7 01/10/2017   CBC    Component Value Date/Time   WBC 6.2 01/10/2017 0938   WBC 7.4 08/05/2016 0956   RBC 4.51 01/10/2017 0938   RBC 4.56 08/05/2016 0956   HGB 13.2 01/10/2017 0938   HCT 41.3 01/10/2017 0938   PLT 308.0 08/05/2016 0956   MCV 92 01/10/2017 0938   MCH 29.3 01/10/2017 0938   MCHC 32.0 01/10/2017 0938   MCHC 33.6 08/05/2016 0956   RDW 14.0 01/10/2017 0938   LYMPHSABS 2.1 01/10/2017 0938   MONOABS 0.7 08/05/2016 0956   EOSABS 0.2 01/10/2017 0938   BASOSABS 0.1 01/10/2017 0938   Iron/TIBC/Ferritin/ %Sat No results found for: IRON, TIBC, FERRITIN, IRONPCTSAT Lipid Panel     Component Value Date/Time   CHOL 238 (H) 11/30/2017 0944   TRIG 112 11/30/2017 0944   TRIG 94 04/07/2006 1019   HDL 57 11/30/2017 0944   CHOLHDL 4.1 07/20/2017 1229   CHOLHDL 4 08/05/2016 0956   VLDL 20.0 08/05/2016 0956   LDLCALC 159 (H) 11/30/2017 0944   LDLDIRECT 145.4 11/14/2012 0823   Hepatic Function Panel     Component Value Date/Time   PROT 6.5 11/30/2017 0944   ALBUMIN 4.1 11/30/2017 0944   AST 15 11/30/2017 0944   ALT 15 11/30/2017 0944   ALKPHOS 81 11/30/2017 0944   BILITOT 0.4 11/30/2017 0944   BILIDIR 0.1 10/29/2013 0831      Component Value Date/Time   TSH 1.020 01/10/2017 0938   TSH 1.21 08/05/2016 0956   TSH 1.56 07/06/2015 0936    ASSESSMENT AND PLAN: Other hyperlipidemia  Class 1 obesity with serious comorbidity and body mass index (BMI) of 30.0 to 30.9 in adult, unspecified obesity type - Starting BMI greater then 30  PLAN:  Hyperlipidemia Miller was informed of the American Heart Association Guidelines emphasizing intensive lifestyle modifications as the first  line treatment for hyperlipidemia. We discussed many lifestyle modifications today in depth, and Frenchie will continue to work on decreasing saturated fats such as fatty red meat, butter and many fried foods. Demri agrees to continue taking her medications, and she will also increase vegetables and lean protein in her diet and continue to work on diet, exercise, and weight loss efforts. Avaree agrees to follow up with our clinic in 3 weeks.  Obesity Shinita is currently in the action stage of change. As such, her goal is to continue with weight loss efforts She has agreed to change to follow the Category 2 plan Glorimar has been instructed to work up to a goal of 150 minutes of combined cardio and strengthening exercise per week for weight loss and overall health benefits. We discussed the following Behavioral Modification Strategies today: work on meal planning and easy cooking plans, travel eating strategies, and planning for success    Dalores has agreed to follow up with our clinic in 3 weeks. She was informed of the importance of frequent follow up visits to maximize her success with intensive lifestyle modifications for her multiple health conditions.   OBESITY BEHAVIORAL INTERVENTION VISIT  Today's visit was # 22  Starting weight: 185 lbs Starting date: 01/10/17 Today's weight : 168 lbs Today's date: 02/01/2018 Total lbs lost to date: 17 At least 15 minutes were spent on discussing the following behavioral intervention visit.   ASK: We discussed the diagnosis of obesity with Lurlean Leyden today and Caryle agreed to give [name] permission  to discuss obesity behavioral modification therapy today.  ASSESS: Chestine has the diagnosis of obesity and her BMI today is 27.96 Milliana is in the action stage of change   ADVISE: Kaleeyah was educated on the multiple health risks of obesity as well as the benefit of weight loss to improve her health. She was advised of the need for long term treatment and the  importance of lifestyle modifications.  AGREE: Multiple dietary modification options and treatment options were discussed and  Denyla agreed to the above obesity treatment plan.  Wilhemena Durie, am acting as transcriptionist for Abby Potash, PA-C I, Abby Potash, PA-C have reviewed above note and agree with its content

## 2018-02-07 DIAGNOSIS — R0989 Other specified symptoms and signs involving the circulatory and respiratory systems: Secondary | ICD-10-CM | POA: Diagnosis not present

## 2018-02-07 DIAGNOSIS — J018 Other acute sinusitis: Secondary | ICD-10-CM | POA: Diagnosis not present

## 2018-02-22 ENCOUNTER — Encounter (INDEPENDENT_AMBULATORY_CARE_PROVIDER_SITE_OTHER): Payer: Self-pay | Admitting: Physician Assistant

## 2018-02-22 ENCOUNTER — Ambulatory Visit (INDEPENDENT_AMBULATORY_CARE_PROVIDER_SITE_OTHER): Payer: Medicare HMO | Admitting: Physician Assistant

## 2018-02-22 VITALS — BP 134/70 | HR 56 | Temp 97.8°F | Ht 65.0 in | Wt 171.0 lb

## 2018-02-22 DIAGNOSIS — R7303 Prediabetes: Secondary | ICD-10-CM

## 2018-02-22 DIAGNOSIS — D2272 Melanocytic nevi of left lower limb, including hip: Secondary | ICD-10-CM | POA: Diagnosis not present

## 2018-02-22 DIAGNOSIS — D225 Melanocytic nevi of trunk: Secondary | ICD-10-CM | POA: Diagnosis not present

## 2018-02-22 DIAGNOSIS — Z6830 Body mass index (BMI) 30.0-30.9, adult: Secondary | ICD-10-CM

## 2018-02-22 DIAGNOSIS — E669 Obesity, unspecified: Secondary | ICD-10-CM

## 2018-02-22 DIAGNOSIS — Z86018 Personal history of other benign neoplasm: Secondary | ICD-10-CM | POA: Diagnosis not present

## 2018-02-22 DIAGNOSIS — Z23 Encounter for immunization: Secondary | ICD-10-CM | POA: Diagnosis not present

## 2018-02-22 DIAGNOSIS — L821 Other seborrheic keratosis: Secondary | ICD-10-CM | POA: Diagnosis not present

## 2018-02-22 DIAGNOSIS — Q825 Congenital non-neoplastic nevus: Secondary | ICD-10-CM | POA: Diagnosis not present

## 2018-02-22 DIAGNOSIS — D2261 Melanocytic nevi of right upper limb, including shoulder: Secondary | ICD-10-CM | POA: Diagnosis not present

## 2018-02-22 DIAGNOSIS — L820 Inflamed seborrheic keratosis: Secondary | ICD-10-CM | POA: Diagnosis not present

## 2018-02-22 DIAGNOSIS — D485 Neoplasm of uncertain behavior of skin: Secondary | ICD-10-CM | POA: Diagnosis not present

## 2018-02-26 NOTE — Progress Notes (Signed)
Office: [phone]  /  Fax: [phone]   HPI:   Chief Complaint: OBESITY Cindy [name] is here to discuss her progress with her obesity treatment plan. She is on the Category 2 plan and is following her eating plan approximately 45 % of the time. She states she is walking for 60-90 minutes 1 time per week. Cindy [name] reports that she has been traveling for work and struggled to follow the plan. She is ready to get back on track.  Her weight is 171 lb (77.6 kg) today and has gained 3 pounds since her last visit. She has lost 14 lbs since starting treatment with [name].  Pre-Diabetes Cindy [name] has a diagnosis of pre-diabetes based on her elevated Hgb A1c and was informed this puts her at greater risk of developing diabetes. She is on metformin and denies nausea, vomiting, or diarrhea. She continues to work on diet and exercise to decrease risk of diabetes. She denies polyphagia or hypoglycemia.  ALLERGIES: Allergies  Allergen Reactions  . Codeine Other (See Comments)    hallucinations  . Crestor [Rosuvastatin]     REACTION: myalgias  . Other Other (See Comments)    NOVACAINE- blisters left side of mouth after receiving   . Amoxicillin Rash  . Erythromycin Other (See Comments)    cramps  . Sulfa Antibiotics Rash  . Sulfonamide Derivatives Rash    MEDICATIONS: Current Outpatient Medications on File Prior to Visit  Medication Sig Dispense Refill  . aspirin 81 MG tablet Take 81 mg by mouth daily.    . cholecalciferol (VITAMIN D) 1000 units tablet Take 2,000 Units by mouth daily.     . cycloSPORINE (RESTASIS) 0.05 % ophthalmic emulsion Place 1 drop into both eyes daily as needed. Reported on 07/09/2015    . DULoxetine (CYMBALTA) 30 MG capsule TAKE ONE CAPSULE BY MOUTH EVERY OTHER DAY 30 capsule 0  . fluticasone (FLONASE) 50 MCG/ACT nasal spray Place 1 spray into both nostrils as needed.     Marland Kitchen levocetirizine (XYZAL) 5 MG tablet Take 5 mg by mouth as needed.   3  . metFORMIN (GLUCOPHAGE) 500 MG tablet  TAKE 1 TABLET TWICE A DAY WITH A MEAL 60 tablet 0  . pravastatin (PRAVACHOL) 80 MG tablet TAKE ONE-HALF (1/2) TABLET BY MOUTH DAILY 45 tablet 1  . Turmeric 500 MG CAPS Take by mouth.     No current facility-administered medications on file prior to visit.     PAST MEDICAL HISTORY: Past Medical History:  Diagnosis Date  . Abnormal liver function tests 07/19/2015  . Allergic state 02/01/2010   Qualifier: Diagnosis of  By: Regis Bill MD, Standley Brooking   . Allergy   . Anxiety   . Back pain   . Chicken pox 72 yrs old  . Depressive disorder, not elsewhere classified   . Diabetes ([place])    diet controlled- no medicines   . Fibroid   . History of fractured kneecap    right   . Hyperglycemia   . Hyperlipidemia   . Infertility, female   . Joint pain   . Knee pain, left 10/05/2011   Follows with Dr Margarette Canada  . Measles as a child  . Medicare annual wellness visit, subsequent 07/06/2014  . OA (osteoarthritis) of knee    left knee  . Obesity 10/05/2011  . Obesity 10/05/2011  . Osteoarthritis   . Osteopenia 06/26/2014  . Other and unspecified hyperlipidemia   . Overweight 10/05/2011  . Overweight(278.02) 10/05/2011  . Sleep apnea  On CPAP  . Sleep apnea   . Stye 11/19/2012  . Syncope 01/26/2017  . Thyroid disease    h/o hyperthyroid treated with radioactive iodine? from 16 to 20  . Type 2 diabetes mellitus with hyperlipidemia ([place])   . Varicose vein of leg     PAST SURGICAL HISTORY: Past Surgical History:  Procedure Laterality Date  . BACK SURGERY     L5 discectomy, 2002. helpful  . CERVICAL BIOPSY  W/ LOOP ELECTRODE EXCISION    . CESAREAN SECTION  1987  . COLONOSCOPY    . CPAP    . KNEE SURGERY Right 2006   R knee fx. patella  . LEEP    . stitches in foot Right     SOCIAL HISTORY: Social History   Tobacco Use  . Smoking status: Never Smoker  . Smokeless tobacco: Never Used  Substance Use Topics  . Alcohol use: Yes    Alcohol/week: 1.0 standard drinks    Types: 1 Glasses of wine  per week  . Drug use: No    FAMILY HISTORY: Family History  Problem Relation Age of Onset  . Hyperlipidemia Mother   . Dementia Mother   . Obesity Mother   . Hyperlipidemia Father   . Heart disease Father        bypass, CAD  . Sleep apnea Father   . Hyperlipidemia Sister   . Diabetes Sister        type 2  . Colon polyps Sister   . Dementia Maternal Grandmother        alzheimer  . Alcohol abuse Maternal Grandfather   . Diabetes Paternal Grandmother        type 2?  . Colon cancer Neg Hx   . Esophageal cancer Neg Hx   . Rectal cancer Neg Hx   . Stomach cancer Neg Hx     ROS: Review of Systems  Constitutional: Positive for weight loss.  Gastrointestinal: Negative for diarrhea, nausea and vomiting.  Endo/Heme/Allergies:       Negative polyphagia Negative hypoglycemia    PHYSICAL EXAM: Blood pressure 134/70, pulse (!) 56, temperature 97.8 F (36.6 C), temperature source Oral, height 5\' 5"  (1.651 m), weight 171 lb (77.6 kg), last menstrual period 04/04/2000, SpO2 98 %. Body mass index is 28.46 kg/m. Physical Exam  Constitutional: She is oriented to person, place, and time. She appears well-developed and well-nourished.  Cardiovascular: Normal rate.  Pulmonary/Chest: Effort normal.  Musculoskeletal: Normal range of motion.  Neurological: She is oriented to person, place, and time.  Skin: Skin is warm and dry.  Psychiatric: She has a normal mood and affect. Her behavior is normal.  Vitals reviewed.   RECENT LABS AND TESTS: BMET    Component Value Date/Time   NA 141 11/30/2017 0944   K 4.5 11/30/2017 0944   CL 101 11/30/2017 0944   CO2 22 11/30/2017 0944   GLUCOSE 91 11/30/2017 0944   GLUCOSE 81 07/31/2017 1411   BUN 26 11/30/2017 0944   CREATININE 0.69 11/30/2017 0944   CALCIUM 9.0 11/30/2017 0944   GFRNONAA 88 11/30/2017 0944   GFRAA 102 11/30/2017 0944   Lab Results  Component Value Date   HGBA1C 5.8 (H) 11/30/2017   HGBA1C 5.7 (H) 07/20/2017    HGBA1C 5.8 (H) 01/10/2017   HGBA1C 6.1 08/05/2016   HGBA1C 5.9 07/06/2015   Lab Results  Component Value Date   INSULIN 9.6 11/30/2017   INSULIN 9.7 07/20/2017   INSULIN 11.7 01/10/2017   CBC  Component Value Date/Time   WBC 6.2 01/10/2017 0938   WBC 7.4 08/05/2016 0956   RBC 4.51 01/10/2017 0938   RBC 4.56 08/05/2016 0956   HGB 13.2 01/10/2017 0938   HCT 41.3 01/10/2017 0938   PLT 308.0 08/05/2016 0956   MCV 92 01/10/2017 0938   MCH 29.3 01/10/2017 0938   MCHC 32.0 01/10/2017 0938   MCHC 33.6 08/05/2016 0956   RDW 14.0 01/10/2017 0938   LYMPHSABS 2.1 01/10/2017 0938   MONOABS 0.7 08/05/2016 0956   EOSABS 0.2 01/10/2017 0938   BASOSABS 0.1 01/10/2017 0938   Iron/TIBC/Ferritin/ %Sat No results found for: IRON, TIBC, FERRITIN, IRONPCTSAT Lipid Panel     Component Value Date/Time   CHOL 238 (H) 11/30/2017 0944   TRIG 112 11/30/2017 0944   TRIG 94 04/07/2006 1019   HDL 57 11/30/2017 0944   CHOLHDL 4.1 07/20/2017 1229   CHOLHDL 4 08/05/2016 0956   VLDL 20.0 08/05/2016 0956   LDLCALC 159 (H) 11/30/2017 0944   LDLDIRECT 145.4 11/14/2012 0823   Hepatic Function Panel     Component Value Date/Time   PROT 6.5 11/30/2017 0944   ALBUMIN 4.1 11/30/2017 0944   AST 15 11/30/2017 0944   ALT 15 11/30/2017 0944   ALKPHOS 81 11/30/2017 0944   BILITOT 0.4 11/30/2017 0944   BILIDIR 0.1 10/29/2013 0831      Component Value Date/Time   TSH 1.020 01/10/2017 0938   TSH 1.21 08/05/2016 0956   TSH 1.56 07/06/2015 0936    ASSESSMENT AND PLAN: Prediabetes  Class 1 obesity with serious comorbidity and body mass index (BMI) of 30.0 to 30.9 in adult, unspecified obesity type  PLAN:  Pre-Diabetes Leland will continue to work on weight loss, diet, exercise, and decreasing simple carbohydrates in her diet to help decrease the risk of diabetes. We dicussed metformin including benefits and risks. She was informed that eating too many simple carbohydrates or too many calories at one  sitting increases the likelihood of GI side effects. Kursten agrees to continue taking metformin and she agrees to follow up with our clinic in 3 weeks as directed to monitor her progress.  Obesity Irean is currently in the action stage of change. As such, her goal is to continue with weight loss efforts She has agreed to follow the Pescatarian eating plan Adoria has been instructed to work up to a goal of 150 minutes of combined cardio and strengthening exercise per week for weight loss and overall health benefits. We discussed the following Behavioral Modification Strategies today: increasing lean protein intake, work on meal planning and easy cooking plans, holiday eating strategies, and keeping healthy foods in the home    Maryln has agreed to follow up with our clinic in 3 weeks. She was informed of the importance of frequent follow up visits to maximize her success with intensive lifestyle modifications for her multiple health conditions.   OBESITY BEHAVIORAL INTERVENTION VISIT  Today's visit was # 23   Starting weight: 185 lbs Starting date: 01/10/17 Today's weight : 171 lbs Today's date: 02/22/2018 Total lbs lost to date: 14 At least 15 minutes were spent on discussing the following behavioral intervention visit.   ASK: We discussed the diagnosis of obesity with Lurlean Leyden today and Tanieka agreed to give [name] permission to discuss obesity behavioral modification therapy today.  ASSESS: Genelda has the diagnosis of obesity and her BMI today is 28.46 Jessia is in the action stage of change   ADVISE: Denitra was educated on  the multiple health risks of obesity as well as the benefit of weight loss to improve her health. She was advised of the need for long term treatment and the importance of lifestyle modifications.  AGREE: Multiple dietary modification options and treatment options were discussed and  Kare agreed to the above obesity treatment plan.  Wilhemena Durie, am acting  as transcriptionist for Abby Potash, PA-C I, Abby Potash, PA-C have reviewed above note and agree with its content

## 2018-03-13 DIAGNOSIS — R69 Illness, unspecified: Secondary | ICD-10-CM | POA: Diagnosis not present

## 2018-03-14 ENCOUNTER — Other Ambulatory Visit (INDEPENDENT_AMBULATORY_CARE_PROVIDER_SITE_OTHER): Payer: Self-pay | Admitting: Physician Assistant

## 2018-03-14 ENCOUNTER — Telehealth: Payer: Self-pay | Admitting: Family Medicine

## 2018-03-14 ENCOUNTER — Other Ambulatory Visit: Payer: Self-pay | Admitting: Family Medicine

## 2018-03-14 DIAGNOSIS — R7303 Prediabetes: Secondary | ICD-10-CM

## 2018-03-14 MED ORDER — PRAVASTATIN SODIUM 80 MG PO TABS: 80.0000 mg | ORAL_TABLET | Freq: Every day | ORAL | 1 refills | Status: DC

## 2018-03-14 NOTE — Telephone Encounter (Signed)
Copied from [place] [id number]. Topic: Quick Communication - Rx Refill/Question >> Mar 14, 2018  9:47 AM Scherrie Gerlach wrote: Medication: pravastatin (PRAVACHOL) 80 MG tablet 90 day On 10/28 Dr Charlett Blake advised pt through [facility] message OK to take one 80 mg tab a day instead of 1/2 tab daily.  Pt is going to run out earlier than what was prescribed. Pt requesting new Rx sent to [facility] [address] [place] [address] [phone] (Phone) [phone] (Fax)

## 2018-03-14 NOTE — Telephone Encounter (Signed)
Please advise 

## 2018-03-14 NOTE — Telephone Encounter (Signed)
Sent in higher quantity

## 2018-03-15 ENCOUNTER — Encounter (INDEPENDENT_AMBULATORY_CARE_PROVIDER_SITE_OTHER): Payer: Self-pay | Admitting: Physician Assistant

## 2018-03-15 ENCOUNTER — Ambulatory Visit (INDEPENDENT_AMBULATORY_CARE_PROVIDER_SITE_OTHER): Payer: Medicare HMO | Admitting: Physician Assistant

## 2018-03-15 VITALS — BP 128/64 | HR 62 | Temp 98.0°F | Ht 65.0 in | Wt 170.0 lb

## 2018-03-15 DIAGNOSIS — E669 Obesity, unspecified: Secondary | ICD-10-CM

## 2018-03-15 DIAGNOSIS — E7849 Other hyperlipidemia: Secondary | ICD-10-CM

## 2018-03-15 DIAGNOSIS — Z6830 Body mass index (BMI) 30.0-30.9, adult: Secondary | ICD-10-CM | POA: Diagnosis not present

## 2018-03-15 DIAGNOSIS — Z9189 Other specified personal risk factors, not elsewhere classified: Secondary | ICD-10-CM | POA: Diagnosis not present

## 2018-03-15 DIAGNOSIS — E119 Type 2 diabetes mellitus without complications: Secondary | ICD-10-CM | POA: Diagnosis not present

## 2018-03-15 MED ORDER — METFORMIN HCL 500 MG PO TABS: ORAL_TABLET | ORAL | 0 refills | Status: DC

## 2018-03-19 NOTE — Progress Notes (Signed)
Office: [phone]  /  Fax: [phone]   HPI:   Chief Complaint: OBESITY Cindy [name] is here to discuss her progress with her obesity treatment plan. She is on the portion control better and make smarter food choices plan and is following her eating plan approximately 60 to 70 % of the time. She states she is doing line dancing and walking 30 to 60 minutes 4 times per week. Cindy [name] did well with weight loss. She reports that she has multiple holiday celebrations coming up. Her weight is 170 lb (77.1 kg) today and has had a weight loss of 1 pound over a period of 3 weeks since her last visit. She has lost 15 lbs since starting treatment with [name].  Diabetes II Cindy [name] has a diagnosis of diabetes type II. Cindy [name] denies nausea, vomiting or diarrhea on metformin. Cindy [name] denies any hypoglycemic episodes. Last A1c was at 5.8 She has been working on intensive lifestyle modifications including diet, exercise, and weight loss to help control her blood glucose levels. Cindy [name] denies polyphagia or hypoglycemia.  Hyperlipidemia Cindy [name] has hyperlipidemia and she is on Pravastatin. She has been trying to improve her cholesterol levels with intensive lifestyle modification including a low saturated fat diet, exercise and weight loss. She denies any chest pain.  At risk for cardiovascular disease Cindy [name] is at a higher than average risk for cardiovascular disease due to obesity, diabetes and hyperlipidemia. She currently denies any chest pain.  ALLERGIES: Allergies  Allergen Reactions  . Codeine Other (See Comments)    hallucinations  . Crestor [Rosuvastatin]     REACTION: myalgias  . Other Other (See Comments)    NOVACAINE- blisters left side of mouth after receiving   . Amoxicillin Rash  . Erythromycin Other (See Comments)    cramps  . Sulfa Antibiotics Rash  . Sulfonamide Derivatives Rash    MEDICATIONS: Current Outpatient Medications on File Prior to Visit  Medication Sig Dispense Refill  . aspirin  81 MG tablet Take 81 mg by mouth daily.    . cholecalciferol (VITAMIN D) 1000 units tablet Take 2,000 Units by mouth daily.     . cycloSPORINE (RESTASIS) 0.05 % ophthalmic emulsion Place 1 drop into both eyes daily as needed. Reported on 07/09/2015    . DULoxetine (CYMBALTA) 30 MG capsule TAKE ONE CAPSULE BY MOUTH EVERY OTHER DAY 30 capsule 0  . fluticasone (FLONASE) 50 MCG/ACT nasal spray Place 1 spray into both nostrils as needed.     Marland Kitchen levocetirizine (XYZAL) 5 MG tablet Take 5 mg by mouth as needed.   3  . pravastatin (PRAVACHOL) 80 MG tablet Take 1 tablet (80 mg total) by mouth daily. 90 tablet 1  . Turmeric 500 MG CAPS Take by mouth.     No current facility-administered medications on file prior to visit.     PAST MEDICAL HISTORY: Past Medical History:  Diagnosis Date  . Abnormal liver function tests 07/19/2015  . Allergic state 02/01/2010   Qualifier: Diagnosis of  By: Regis Bill MD, Standley Brooking   . Allergy   . Anxiety   . Back pain   . Chicken pox 71 yrs old  . Depressive disorder, not elsewhere classified   . Diabetes ([place])    diet controlled- no medicines   . Fibroid   . History of fractured kneecap    right   . Hyperglycemia   . Hyperlipidemia   . Infertility, female   . Joint pain   . Knee pain, left 10/05/2011   Follows  with Dr Margarette Canada  . Measles as a child  . Medicare annual wellness visit, subsequent 07/06/2014  . OA (osteoarthritis) of knee    left knee  . Obesity 10/05/2011  . Obesity 10/05/2011  . Osteoarthritis   . Osteopenia 06/26/2014  . Other and unspecified hyperlipidemia   . Overweight 10/05/2011  . Overweight(278.02) 10/05/2011  . Sleep apnea     On CPAP  . Sleep apnea   . Stye 11/19/2012  . Syncope 01/26/2017  . Thyroid disease    h/o hyperthyroid treated with radioactive iodine? from 16 to 20  . Type 2 diabetes mellitus with hyperlipidemia ([place])   . Varicose vein of leg     PAST SURGICAL HISTORY: Past Surgical History:  Procedure Laterality Date  . BACK  SURGERY     L5 discectomy, 2002. helpful  . CERVICAL BIOPSY  W/ LOOP ELECTRODE EXCISION    . CESAREAN SECTION  1987  . COLONOSCOPY    . CPAP    . KNEE SURGERY Right 2006   R knee fx. patella  . LEEP    . stitches in foot Right     SOCIAL HISTORY: Social History   Tobacco Use  . Smoking status: Never Smoker  . Smokeless tobacco: Never Used  Substance Use Topics  . Alcohol use: Yes    Alcohol/week: 1.0 standard drinks    Types: 1 Glasses of wine per week  . Drug use: No    FAMILY HISTORY: Family History  Problem Relation Age of Onset  . Hyperlipidemia Mother   . Dementia Mother   . Obesity Mother   . Hyperlipidemia Father   . Heart disease Father        bypass, CAD  . Sleep apnea Father   . Hyperlipidemia Sister   . Diabetes Sister        type 2  . Colon polyps Sister   . Dementia Maternal Grandmother        alzheimer  . Alcohol abuse Maternal Grandfather   . Diabetes Paternal Grandmother        type 2?  . Colon cancer Neg Hx   . Esophageal cancer Neg Hx   . Rectal cancer Neg Hx   . Stomach cancer Neg Hx     ROS: Review of Systems  Constitutional: Positive for weight loss.  Cardiovascular: Negative for chest pain.  Gastrointestinal: Negative for diarrhea, nausea and vomiting.  Endo/Heme/Allergies:       Negative for polyphagia Negative for hypoglycemia    PHYSICAL EXAM: Blood pressure 128/64, pulse 62, temperature 98 F (36.7 C), temperature source Oral, height 5\' 5"  (1.651 m), weight 170 lb (77.1 kg), last menstrual period 04/04/2000, SpO2 99 %. Body mass index is 28.29 kg/m. Physical Exam Vitals signs reviewed.  Constitutional:      Appearance: Normal appearance. She is well-developed. She is obese.  Cardiovascular:     Rate and Rhythm: Normal rate.  Pulmonary:     Effort: Pulmonary effort is normal.  Musculoskeletal: Normal range of motion.  Skin:    General: Skin is warm and dry.  Neurological:     Mental Status: She is alert and oriented  to person, place, and time.  Psychiatric:        Mood and Affect: Mood normal.        Behavior: Behavior normal.     RECENT LABS AND TESTS: BMET    Component Value Date/Time   NA 141 11/30/2017 0944   K 4.5 11/30/2017 0944  CL 101 11/30/2017 0944   CO2 22 11/30/2017 0944   GLUCOSE 91 11/30/2017 0944   GLUCOSE 81 07/31/2017 1411   BUN 26 11/30/2017 0944   CREATININE 0.69 11/30/2017 0944   CALCIUM 9.0 11/30/2017 0944   GFRNONAA 88 11/30/2017 0944   GFRAA 102 11/30/2017 0944   Lab Results  Component Value Date   HGBA1C 5.8 (H) 11/30/2017   HGBA1C 5.7 (H) 07/20/2017   HGBA1C 5.8 (H) 01/10/2017   HGBA1C 6.1 08/05/2016   HGBA1C 5.9 07/06/2015   Lab Results  Component Value Date   INSULIN 9.6 11/30/2017   INSULIN 9.7 07/20/2017   INSULIN 11.7 01/10/2017   CBC    Component Value Date/Time   WBC 6.2 01/10/2017 0938   WBC 7.4 08/05/2016 0956   RBC 4.51 01/10/2017 0938   RBC 4.56 08/05/2016 0956   HGB 13.2 01/10/2017 0938   HCT 41.3 01/10/2017 0938   PLT 308.0 08/05/2016 0956   MCV 92 01/10/2017 0938   MCH 29.3 01/10/2017 0938   MCHC 32.0 01/10/2017 0938   MCHC 33.6 08/05/2016 0956   RDW 14.0 01/10/2017 0938   LYMPHSABS 2.1 01/10/2017 0938   MONOABS 0.7 08/05/2016 0956   EOSABS 0.2 01/10/2017 0938   BASOSABS 0.1 01/10/2017 0938   Iron/TIBC/Ferritin/ %Sat No results found for: IRON, TIBC, FERRITIN, IRONPCTSAT Lipid Panel     Component Value Date/Time   CHOL 238 (H) 11/30/2017 0944   TRIG 112 11/30/2017 0944   TRIG 94 04/07/2006 1019   HDL 57 11/30/2017 0944   CHOLHDL 4.1 07/20/2017 1229   CHOLHDL 4 08/05/2016 0956   VLDL 20.0 08/05/2016 0956   LDLCALC 159 (H) 11/30/2017 0944   LDLDIRECT 145.4 11/14/2012 0823   Hepatic Function Panel     Component Value Date/Time   PROT 6.5 11/30/2017 0944   ALBUMIN 4.1 11/30/2017 0944   AST 15 11/30/2017 0944   ALT 15 11/30/2017 0944   ALKPHOS 81 11/30/2017 0944   BILITOT 0.4 11/30/2017 0944   BILIDIR 0.1  10/29/2013 0831      Component Value Date/Time   TSH 1.020 01/10/2017 0938   TSH 1.21 08/05/2016 0956   TSH 1.56 07/06/2015 0936    Ref. Range 11/30/2017 09:44  Vitamin D, 25-Hydroxy Latest Ref Range: 30.0 - 100.0 ng/mL 60.8   ASSESSMENT AND PLAN: Other hyperlipidemia  Type 2 diabetes mellitus without complication, without long-term current use of insulin (HCC) - Plan: metFORMIN (GLUCOPHAGE) 500 MG tablet  At risk for heart disease  Class 1 obesity with serious comorbidity and body mass index (BMI) of 30.0 to 30.9 in adult, unspecified obesity type - Starting BMI greater then 30  PLAN:  Diabetes II Cindy [name] has been given extensive diabetes education by myself today including ideal fasting and post-prandial blood glucose readings, individual ideal Hgb A1c goals and hypoglycemia prevention. We discussed the importance of good blood sugar control to decrease the likelihood of diabetic complications such as nephropathy, neuropathy, limb loss, blindness, coronary artery disease, and death. We discussed the importance of intensive lifestyle modification including diet, exercise and weight loss as the first line treatment for diabetes. Cindy [name] agrees to continue metformin 500 mg BID #60 with no refills and follow up at the agreed upon time.  Hyperlipidemia Cindy [name] was informed of the American Heart Association Guidelines emphasizing intensive lifestyle modifications as the first line treatment for hyperlipidemia. We discussed many lifestyle modifications today in depth, and Cindy [name] will continue to work on decreasing saturated fats such as fatty red meat, butter  and many fried foods. She will also increase vegetables and lean protein in her diet and continue to work on exercise and weight loss efforts. Cindy [name] will continue her medications as prescribed and we will check labs at the next visit.  Cardiovascular risk counseling Cindy [name] was given extended (15 minutes) coronary artery disease prevention  counseling today. She is 72 y.o. female and has risk factors for heart disease including obesity, diabetes and hyperlipidemia. We discussed intensive lifestyle modifications today with an emphasis on specific weight loss instructions and strategies. Pt was also informed of the importance of increasing exercise and decreasing saturated fats to help prevent heart disease.  Obesity Cindy [name] is currently in the action stage of change. As such, her goal is to continue with weight loss efforts She has agreed to follow the Pescatarian eating plan Cindy [name] has been instructed to work up to a goal of 150 minutes of combined cardio and strengthening exercise per week for weight loss and overall health benefits. We discussed the following Behavioral Modification Strategies today: work on meal planning and easy cooking plans and holiday eating strategies   Cindy [name] has agreed to follow up with our clinic in 3 weeks. She was informed of the importance of frequent follow up visits to maximize her success with intensive lifestyle modifications for her multiple health conditions.   OBESITY BEHAVIORAL INTERVENTION VISIT  Today's visit was # 24  Starting weight: 185 lbs Starting date: 01/10/2017 Today's weight : 170 lbs  Today's date: 03/15/2018 Total lbs lost to date: 15   ASK: We discussed the diagnosis of obesity with Cindy [name] today and Cindy [name] agreed to give [name] permission to discuss obesity behavioral modification therapy today.  ASSESS: Cindy [name] has the diagnosis of obesity and her BMI today is 28.29 Cindy [name] is in the action stage of change   ADVISE: Cindy [name] was educated on the multiple health risks of obesity as well as the benefit of weight loss to improve her health. She was advised of the need for long term treatment and the importance of lifestyle modifications to improve her current health and to decrease her risk of future health problems.  AGREE: Multiple dietary modification options and treatment  options were discussed and  Cindy [name] agreed to follow the recommendations documented in the above note.  ARRANGE: Cindy [name] was educated on the importance of frequent visits to treat obesity as outlined per CMS and USPSTF guidelines and agreed to schedule her next follow up appointment today.  Corey Skains, am acting as transcriptionist for Abby Potash, PA-C I, Abby Potash, PA-C have reviewed above note and agree with its content

## 2018-03-27 ENCOUNTER — Other Ambulatory Visit: Payer: Self-pay | Admitting: Family Medicine

## 2018-03-27 DIAGNOSIS — F339 Major depressive disorder, recurrent, unspecified: Secondary | ICD-10-CM

## 2018-03-29 NOTE — Telephone Encounter (Signed)
Pt. Requesting cymbalta refill, last refilled 01/29/18 #30, 0RF, written for every other day. LOV 1with PCP 01/2017. Order left pended for Dr. Charlett Blake to advise.

## 2018-04-10 ENCOUNTER — Encounter (INDEPENDENT_AMBULATORY_CARE_PROVIDER_SITE_OTHER): Payer: Self-pay | Admitting: Physician Assistant

## 2018-04-10 ENCOUNTER — Ambulatory Visit (INDEPENDENT_AMBULATORY_CARE_PROVIDER_SITE_OTHER): Payer: Medicare HMO | Admitting: Physician Assistant

## 2018-04-10 VITALS — BP 121/57 | HR 53 | Temp 97.7°F | Ht 65.0 in | Wt 171.0 lb

## 2018-04-10 DIAGNOSIS — Z6830 Body mass index (BMI) 30.0-30.9, adult: Secondary | ICD-10-CM | POA: Diagnosis not present

## 2018-04-10 DIAGNOSIS — E669 Obesity, unspecified: Secondary | ICD-10-CM | POA: Diagnosis not present

## 2018-04-10 DIAGNOSIS — E7849 Other hyperlipidemia: Secondary | ICD-10-CM

## 2018-04-10 DIAGNOSIS — E119 Type 2 diabetes mellitus without complications: Secondary | ICD-10-CM

## 2018-04-10 DIAGNOSIS — E559 Vitamin D deficiency, unspecified: Secondary | ICD-10-CM | POA: Diagnosis not present

## 2018-04-10 NOTE — Progress Notes (Addendum)
Office: [phone]  /  Fax: [phone]   HPI:   Chief Complaint: OBESITY Cindy [name] is here to discuss her progress with her obesity treatment plan. She is on the Pescatarian eating plan and is following her eating plan approximately 50 % of the time. She states she is doing exercise walks 30-45 minutes 1 times per week. Cindy [name] reports that she struggled to follow the plan during the holidays. She is bored with her plan but does not want to start eating chicken or beef.  Her weight is 171 lb (77.6 kg) today and she has gained 1 lbs since her last visit. She has lost 14 lbs since starting treatment with [name].  Hyperlipidemia Cindy [name] has hyperlipidemia and has been trying to improve her cholesterol levels with intensive lifestyle modification including a low saturated fat diet, exercise and weight loss. She is taking pravastatin. She denies any chest pain.  Vitamin D deficiency Cindy [name] has a diagnosis of vitamin D deficiency. She is currently taking OTC Vit D and denies nausea, vomiting or muscle weakness.  Diabetes II Cindy [name] has a diagnosis of diabetes type II. Cindy [name]'s last A1C was 5.8 on 11/30/2017. She is on metformin. She denies any polyphagia or hypoglycemic episodes.  She has been working on intensive lifestyle modifications including diet, exercise, and weight loss to help control her blood glucose levels.   ASSESSMENT AND PLAN:  Other hyperlipidemia - Plan: Lipid Panel With LDL/HDL Ratio  Vitamin D deficiency - Plan: VITAMIN D 25 Hydroxy (Vit-D Deficiency, Fractures)  Type 2 diabetes mellitus without complication, without long-term current use of insulin (HCC) - Plan: Comprehensive metabolic panel, Hemoglobin A1c, Insulin, random  Class 1 obesity with serious comorbidity and body mass index (BMI) of 30.0 to 30.9 in adult, unspecified obesity type - Starting BMI greater then 30  PLAN:  Hyperlipidemia Cindy [name] was informed of the American Heart Association Guidelines emphasizing intensive  lifestyle modifications as the first line treatment for hyperlipidemia. We discussed many lifestyle modifications today in depth, and Cindy [name] continue to work on decreasing saturated fats such as butter and many fried foods. She will also increase vegetables and lean protein in her diet and continue to work on exercise and weight loss efforts. We will check labs.Cindy [name] agrees to continue with her current medications as prescribed and follow up with our clinic in 2 weeks.  Vitamin D Deficiency Cindy [name] was informed that low vitamin D levels contributes to fatigue and are associated with obesity, breast, and colon cancer. She agrees to continue to take Vit D 2000 IU every day and will follow up for routine testing of vitamin D, at least 2-3 times per year. Her last vitamin D level was at goal. She was informed of the risk of over-replacement of vitamin D and agrees to not increase her dose unless she discusses this with [name] first. We will check labs.Cindy [name] agrees to follow up with our clinic in 2 weeks.  Diabetes II Cindy [name] has been given extensive diabetes education by myself today including ideal fasting and post-prandial blood glucose readings, individual ideal Hgb A1c goals and hypoglycemia prevention. We discussed the importance of good blood sugar control to decrease the likelihood of diabetic complications such as nephropathy, neuropathy, limb loss, blindness, coronary artery disease, and death. We discussed the importance of intensive lifestyle modification including diet, exercise and weight loss as the first line treatment for diabetes. Her last A1C was 5.8. We will check labs.Cindy [name] agrees to continue with current medications as directed and follow up with our  clinic in 2 weeks.  Obesity Cindy [name] is currently in the action stage of change. As such, her goal is to continue with weight loss efforts She has agreed to follow the Pescatarian eating plan  Cindy [name] has been instructed to work up to a goal of 150  minutes of combined cardio and strengthening exercise per week for weight loss and overall health benefits. We discussed the following Behavioral Modification Strategies today: work on meal planning and easy cooking strategies and ways to avoid boredom eating.   Cindy [name] has agreed to follow up with our clinic in 2 weeks. She was informed of the importance of frequent follow up visits to maximize her success with intensive lifestyle modifications for her multiple health conditions.  ALLERGIES: Allergies  Allergen Reactions  . Codeine Other (See Comments)    hallucinations  . Crestor [Rosuvastatin]     REACTION: myalgias  . Other Other (See Comments)    NOVACAINE- blisters left side of mouth after receiving   . Amoxicillin Rash  . Erythromycin Other (See Comments)    cramps  . Sulfa Antibiotics Rash  . Sulfonamide Derivatives Rash    MEDICATIONS: Current Outpatient Medications on File Prior to Visit  Medication Sig Dispense Refill  . aspirin 81 MG tablet Take 81 mg by mouth daily.    . cholecalciferol (VITAMIN D) 1000 units tablet Take 2,000 Units by mouth daily.     . cycloSPORINE (RESTASIS) 0.05 % ophthalmic emulsion Place 1 drop into both eyes daily as needed. Reported on 07/09/2015    . DULoxetine (CYMBALTA) 30 MG capsule TAKE ONE CAPSULE BY MOUTH EVERY OTHER DAY 30 capsule 0  . fluticasone (FLONASE) 50 MCG/ACT nasal spray Place 1 spray into both nostrils as needed.     Marland Kitchen levocetirizine (XYZAL) 5 MG tablet Take 5 mg by mouth as needed.   3  . metFORMIN (GLUCOPHAGE) 500 MG tablet TAKE 1 TABLET TWICE A DAY WITH A MEAL 60 tablet 0  . pravastatin (PRAVACHOL) 80 MG tablet Take 1 tablet (80 mg total) by mouth daily. 90 tablet 1  . Turmeric 500 MG CAPS Take by mouth.     No current facility-administered medications on file prior to visit.     PAST MEDICAL HISTORY: Past Medical History:  Diagnosis Date  . Abnormal liver function tests 07/19/2015  . Allergic state 02/01/2010    Qualifier: Diagnosis of  By: Regis Bill MD, Standley Brooking   . Allergy   . Anxiety   . Back pain   . Chicken pox 73 yrs old  . Depressive disorder, not elsewhere classified   . Diabetes ([place])    diet controlled- no medicines   . Fibroid   . History of fractured kneecap    right   . Hyperglycemia   . Hyperlipidemia   . Infertility, female   . Joint pain   . Knee pain, left 10/05/2011   Follows with Dr Margarette [place]  . Measles as a child  . Medicare annual wellness visit, subsequent 07/06/2014  . OA (osteoarthritis) of knee    left knee  . Obesity 10/05/2011  . Obesity 10/05/2011  . Osteoarthritis   . Osteopenia 06/26/2014  . Other and unspecified hyperlipidemia   . Overweight 10/05/2011  . Overweight(278.02) 10/05/2011  . Sleep apnea     On CPAP  . Sleep apnea   . Stye 11/19/2012  . Syncope 01/26/2017  . Thyroid disease    h/o hyperthyroid treated with radioactive iodine? from 16 to 20  . Type 2  diabetes mellitus with hyperlipidemia ([place])   . Varicose vein of leg     PAST SURGICAL HISTORY: Past Surgical History:  Procedure Laterality Date  . BACK SURGERY     L5 discectomy, 2002. helpful  . CERVICAL BIOPSY  W/ LOOP ELECTRODE EXCISION    . CESAREAN SECTION  1987  . COLONOSCOPY    . CPAP    . KNEE SURGERY Right 2006   R knee fx. patella  . LEEP    . stitches in foot Right     SOCIAL HISTORY: Social History   Tobacco Use  . Smoking status: Never Smoker  . Smokeless tobacco: Never Used  Substance Use Topics  . Alcohol use: Yes    Alcohol/week: 1.0 standard drinks    Types: 1 Glasses of wine per week  . Drug use: No    FAMILY HISTORY: Family History  Problem Relation Age of Onset  . Hyperlipidemia Mother   . Dementia Mother   . Obesity Mother   . Hyperlipidemia Father   . Heart disease Father        bypass, CAD  . Sleep apnea Father   . Hyperlipidemia Sister   . Diabetes Sister        type 2  . Colon polyps Sister   . Dementia Maternal Grandmother        alzheimer  .  Alcohol abuse Maternal Grandfather   . Diabetes Paternal Grandmother        type 2?  . Colon cancer Neg Hx   . Esophageal cancer Neg Hx   . Rectal cancer Neg Hx   . Stomach cancer Neg Hx     ROS: Review of Systems  Constitutional: Negative for weight loss.  Cardiovascular: Negative for chest pain.  Gastrointestinal: Negative for diarrhea, nausea and vomiting.  Musculoskeletal:       Negative for muscle weakness  Endo/Heme/Allergies:       Negative for polyphagia  Negative for hypoglycemia     PHYSICAL EXAM: Blood pressure (!) 121/57, pulse (!) 53, temperature 97.7 F (36.5 C), temperature source Oral, height 5\' 5"  (1.651 m), weight 171 lb (77.6 kg), last menstrual period 04/04/2000, SpO2 100 %. Body mass index is 28.46 kg/m. Physical Exam Vitals signs reviewed.  Constitutional:      Appearance: Normal appearance. She is obese.  Cardiovascular:     Rate and Rhythm: Normal rate.     Pulses: Normal pulses.  Pulmonary:     Effort: Pulmonary effort is normal.  Musculoskeletal: Normal range of motion.  Skin:    General: Skin is warm and dry.  Neurological:     Mental Status: She is alert and oriented to person, place, and time.  Psychiatric:        Mood and Affect: Mood normal.        Behavior: Behavior normal.     RECENT LABS AND TESTS: BMET    Component Value Date/Time   NA 141 11/30/2017 0944   K 4.5 11/30/2017 0944   CL 101 11/30/2017 0944   CO2 22 11/30/2017 0944   GLUCOSE 91 11/30/2017 0944   GLUCOSE 81 07/31/2017 1411   BUN 26 11/30/2017 0944   CREATININE 0.69 11/30/2017 0944   CALCIUM 9.0 11/30/2017 0944   GFRNONAA 88 11/30/2017 0944   GFRAA 102 11/30/2017 0944   Lab Results  Component Value Date   HGBA1C 5.8 (H) 11/30/2017   HGBA1C 5.7 (H) 07/20/2017   HGBA1C 5.8 (H) 01/10/2017   HGBA1C 6.1 08/05/2016  HGBA1C 5.9 07/06/2015   Lab Results  Component Value Date   INSULIN 9.6 11/30/2017   INSULIN 9.7 07/20/2017   INSULIN 11.7 01/10/2017    CBC    Component Value Date/Time   WBC 6.2 01/10/2017 0938   WBC 7.4 08/05/2016 0956   RBC 4.51 01/10/2017 0938   RBC 4.56 08/05/2016 0956   HGB 13.2 01/10/2017 0938   HCT 41.3 01/10/2017 0938   PLT 308.0 08/05/2016 0956   MCV 92 01/10/2017 0938   MCH 29.3 01/10/2017 0938   MCHC 32.0 01/10/2017 0938   MCHC 33.6 08/05/2016 0956   RDW 14.0 01/10/2017 0938   LYMPHSABS 2.1 01/10/2017 0938   MONOABS 0.7 08/05/2016 0956   EOSABS 0.2 01/10/2017 0938   BASOSABS 0.1 01/10/2017 0938   Iron/TIBC/Ferritin/ %Sat No results found for: IRON, TIBC, FERRITIN, IRONPCTSAT Lipid Panel     Component Value Date/Time   CHOL 238 (H) 11/30/2017 0944   TRIG 112 11/30/2017 0944   TRIG 94 04/07/2006 1019   HDL 57 11/30/2017 0944   CHOLHDL 4.1 07/20/2017 1229   CHOLHDL 4 08/05/2016 0956   VLDL 20.0 08/05/2016 0956   LDLCALC 159 (H) 11/30/2017 0944   LDLDIRECT 145.4 11/14/2012 0823   Hepatic Function Panel     Component Value Date/Time   PROT 6.5 11/30/2017 0944   ALBUMIN 4.1 11/30/2017 0944   AST 15 11/30/2017 0944   ALT 15 11/30/2017 0944   ALKPHOS 81 11/30/2017 0944   BILITOT 0.4 11/30/2017 0944   BILIDIR 0.1 10/29/2013 0831      Component Value Date/Time   TSH 1.020 01/10/2017 0938   TSH 1.21 08/05/2016 0956   TSH 1.56 07/06/2015 0936     Ref. Range 11/30/2017 09:44  Vitamin D, 25-Hydroxy Latest Ref Range: 30.0 - 100.0 ng/mL 60.8     OBESITY BEHAVIORAL INTERVENTION VISIT  Today's visit was # 25   Starting weight: 185 lbs Starting date: 01/10/2017 Today's weight : 171 lbs Today's date: 04/10/2018 Total lbs lost to date: 14  At least 15 minutes were spent on discussing the following behavioral intervention visit.  ASK: We discussed the diagnosis of obesity with Cindy [name] today and Cindy [name] agreed to give [name] permission to discuss obesity behavioral modification therapy today.  ASSESS: Cindy [name] has the diagnosis of obesity and her BMI today is 28.46 Cindy [name] is in the action  stage of change   ADVISE: Cindy [name] was educated on the multiple health risks of obesity as well as the benefit of weight loss to improve her health. She was advised of the need for long term treatment and the importance of lifestyle modifications to improve her current health and to decrease her risk of future health problems.  AGREE: Multiple dietary modification options and treatment options were discussed and  Cindy [name] agreed to follow the recommendations documented in the above note.  ARRANGE: Cindy [name] was educated on the importance of frequent visits to treat obesity as outlined per CMS and USPSTF guidelines and agreed to schedule her next follow up appointment today.  I, Daire Okimoto, am acting as Location manager for [employer] I, Abby Potash, PA-C have reviewed above note and agree with its content

## 2018-04-11 LAB — COMPREHENSIVE METABOLIC PANEL
ALK PHOS: 87 IU/L (ref 39–117)
ALT: 22 IU/L (ref 0–32)
AST: 19 IU/L (ref 0–40)
Albumin/Globulin Ratio: 1.8 (ref 1.2–2.2)
Albumin: 4.2 g/dL (ref 3.5–4.8)
BUN/Creatinine Ratio: 28 (ref 12–28)
BUN: 20 mg/dL (ref 8–27)
Bilirubin Total: 0.4 mg/dL (ref 0.0–1.2)
CO2: 23 mmol/L (ref 20–29)
Calcium: 10 mg/dL (ref 8.7–10.3)
Chloride: 102 mmol/L (ref 96–106)
Creatinine, Ser: 0.71 mg/dL (ref 0.57–1.00)
GFR calc Af Amer: 100 mL/min/{1.73_m2} (ref >59)
GFR calc non Af Amer: 87 mL/min/{1.73_m2} (ref >59)
Globulin, Total: 2.4 g/dL (ref 1.5–4.5)
Glucose: 95 mg/dL (ref 65–99)
Potassium: 4.4 mmol/L (ref 3.5–5.2)
Sodium: 142 mmol/L (ref 134–144)
Total Protein: 6.6 g/dL (ref 6.0–8.5)

## 2018-04-11 LAB — LIPID PANEL WITH LDL/HDL RATIO
Cholesterol, Total: 273 mg/dL — ABNORMAL HIGH (ref 100–199)
HDL: 59 mg/dL (ref >39)
LDL Calculated: 193 mg/dL — ABNORMAL HIGH (ref 0–99)
LDl/HDL Ratio: 3.3 ratio — ABNORMAL HIGH (ref 0.0–3.2)
Triglycerides: 106 mg/dL (ref 0–149)
VLDL Cholesterol Cal: 21 mg/dL (ref 5–40)

## 2018-04-11 LAB — HEMOGLOBIN A1C
Est. average glucose Bld gHb Est-mCnc: 120 mg/dL
HEMOGLOBIN A1C: 5.8 % — AB (ref 4.8–5.6)

## 2018-04-11 LAB — INSULIN, RANDOM: INSULIN: 6.1 u[IU]/mL (ref 2.6–24.9)

## 2018-04-11 LAB — VITAMIN D 25 HYDROXY (VIT D DEFICIENCY, FRACTURES): Vit D, 25-Hydroxy: 57.2 ng/mL (ref 30.0–100.0)

## 2018-04-13 DIAGNOSIS — G4733 Obstructive sleep apnea (adult) (pediatric): Secondary | ICD-10-CM | POA: Diagnosis not present

## 2018-04-24 ENCOUNTER — Ambulatory Visit (INDEPENDENT_AMBULATORY_CARE_PROVIDER_SITE_OTHER): Payer: Medicare HMO | Admitting: Physician Assistant

## 2018-04-24 ENCOUNTER — Encounter (INDEPENDENT_AMBULATORY_CARE_PROVIDER_SITE_OTHER): Payer: Self-pay | Admitting: Physician Assistant

## 2018-04-24 VITALS — BP 146/73 | HR 63 | Temp 98.1°F | Ht 65.0 in | Wt 170.0 lb

## 2018-04-24 DIAGNOSIS — Z6830 Body mass index (BMI) 30.0-30.9, adult: Secondary | ICD-10-CM | POA: Diagnosis not present

## 2018-04-24 DIAGNOSIS — E669 Obesity, unspecified: Secondary | ICD-10-CM

## 2018-04-24 DIAGNOSIS — E7849 Other hyperlipidemia: Secondary | ICD-10-CM

## 2018-04-24 DIAGNOSIS — E119 Type 2 diabetes mellitus without complications: Secondary | ICD-10-CM | POA: Diagnosis not present

## 2018-04-24 MED ORDER — EZETIMIBE 10 MG PO TABS: 10.0000 mg | ORAL_TABLET | Freq: Every day | ORAL | 0 refills | Status: DC

## 2018-04-24 MED ORDER — METFORMIN HCL 500 MG PO TABS: ORAL_TABLET | ORAL | 0 refills | Status: DC

## 2018-04-24 NOTE — Progress Notes (Signed)
Office: [phone]  /  Fax: [phone]   HPI:   Chief Complaint: OBESITY Cindy [name] is here to discuss her progress with her obesity treatment plan. She is on the  follow the Pescatarian eating plan and is following her eating plan approximately 80 % of the time. She states she is aerobic dancing   45 minutes 3 times per week. Aleni did well with weight loss. She reports that she has been eating some leftover snacks in her house from the holidays.  Her weight is 170 lb (77.1 kg) today and has had a weight loss of 1 pounds over a period of 2 weeks since her last visit. She has lost 15 lbs since starting treatment with [name].  Hyperlipidemia Cindy [name] has hyperlipidemia and has been trying to improve her cholesterol levels with intensive lifestyle modification including a low saturated fat diet, exercise and weight loss. She denies any chest pain. She is taking pravastatin.  Pre-Diabetes Cindy [name] has a diagnosis of prediabetes based on her elevated Hgb A1c and was informed this puts her at greater risk of developing diabetes. She is taking metformin currently and continues to work on diet and exercise to decrease risk of diabetes. She denies nausea or hypoglycemia.   ASSESSMENT AND PLAN:  Other hyperlipidemia - Plan: ezetimibe (ZETIA) 10 MG tablet  Type 2 diabetes mellitus without complication, without long-term current use of insulin (Cindy [name]) - Plan: metFORMIN (GLUCOPHAGE) 500 MG tablet  Class 1 obesity with serious comorbidity and body mass index (BMI) of 30.0 to 30.9 in adult, unspecified obesity type - Starting BMI greater then 30    PLAN:  Hyperlipidemia Cindy [name] was informed of the American Heart Association Guidelines emphasizing intensive lifestyle modifications as the first line treatment for hyperlipidemia. We discussed many lifestyle modifications today in depth, and Cindy [name] will continue to work on decreasing saturated fats such as fatty red meat, butter and many fried foods. She will also  increase vegetables and lean protein in her diet and continue to work on exercise and weight loss efforts. Journie agrees to continue taking pravastatin and to start taking Zetia 10 mg qd #30 with no refills. Cindy [name] agrees to follow up with our clinic in 2 weeks.  Pre-Diabetes Cindy [name] will continue to work on weight loss, exercise, and decreasing simple carbohydrates in her diet to help decrease the risk of diabetes. We dicussed metformin including benefits and risks. She was informed that eating too many simple carbohydrates or too many calories at one sitting increases the likelihood of GI side effects. Cindy [name] agrees to continue taking metformin 500 mg bid #60 with no refills for now and a prescription was written today. Cindy [name] agrees to follow up with our clinic in 2 weeks.  Obesity Cindy [name] is currently in the action stage of change. As such, her goal is to continue with weight loss efforts She has agreed to follow the Pescatarian eating plan    Cindy [name] has been instructed to work up to a goal of 150 minutes of combined cardio and strengthening exercise per week for weight loss and overall health benefits. We discussed the following Behavioral Modification Strategies today: work on meal planning and easy cooking plans  Cindy [name] has agreed to follow up with our clinic in 2 weeks. She was informed of the importance of frequent follow up visits to maximize her success with intensive lifestyle modifications for her multiple health conditions.  ALLERGIES: Allergies  Allergen Reactions  . Codeine Other (See Comments)    hallucinations  . Crestor [Rosuvastatin]  REACTION: myalgias  . Other Other (See Comments)    NOVACAINE- blisters left side of mouth after receiving   . Amoxicillin Rash  . Erythromycin Other (See Comments)    cramps  . Sulfa Antibiotics Rash  . Sulfonamide Derivatives Rash    MEDICATIONS: Current Outpatient Medications on File Prior to Visit  Medication Sig Dispense Refill  .  aspirin 81 MG tablet Take 81 mg by mouth daily.    . cholecalciferol (VITAMIN D) 1000 units tablet Take 2,000 Units by mouth daily.     . cycloSPORINE (RESTASIS) 0.05 % ophthalmic emulsion Place 1 drop into both eyes daily as needed. Reported on 07/09/2015    . DULoxetine (CYMBALTA) 30 MG capsule TAKE ONE CAPSULE BY MOUTH EVERY OTHER DAY 30 capsule 0  . fluticasone (FLONASE) 50 MCG/ACT nasal spray Place 1 spray into both nostrils as needed.     Marland Kitchen levocetirizine (XYZAL) 5 MG tablet Take 5 mg by mouth as needed.   3  . pravastatin (PRAVACHOL) 80 MG tablet Take 1 tablet (80 mg total) by mouth daily. 90 tablet 1  . Turmeric 500 MG CAPS Take by mouth.     No current facility-administered medications on file prior to visit.     PAST MEDICAL HISTORY: Past Medical History:  Diagnosis Date  . Abnormal liver function tests 07/19/2015  . Allergic state 02/01/2010   Qualifier: Diagnosis of  By: Regis Bill MD, Standley Brooking   . Allergy   . Anxiety   . Back pain   . Chicken pox 73 yrs old  . Depressive disorder, not elsewhere classified   . Diabetes ([place])    diet controlled- no medicines   . Fibroid   . History of fractured kneecap    right   . Hyperglycemia   . Hyperlipidemia   . Infertility, female   . Joint pain   . Knee pain, left 10/05/2011   Follows with Dr Margarette Canada  . Measles as a child  . Medicare annual wellness visit, subsequent 07/06/2014  . OA (osteoarthritis) of knee    left knee  . Obesity 10/05/2011  . Obesity 10/05/2011  . Osteoarthritis   . Osteopenia 06/26/2014  . Other and unspecified hyperlipidemia   . Overweight 10/05/2011  . Overweight(278.02) 10/05/2011  . Sleep apnea     On CPAP  . Sleep apnea   . Stye 11/19/2012  . Syncope 01/26/2017  . Thyroid disease    h/o hyperthyroid treated with radioactive iodine? from 16 to 20  . Type 2 diabetes mellitus with hyperlipidemia ([place])   . Varicose vein of leg     PAST SURGICAL HISTORY: Past Surgical History:  Procedure Laterality Date  .  BACK SURGERY     L5 discectomy, 2002. helpful  . CERVICAL BIOPSY  W/ LOOP ELECTRODE EXCISION    . CESAREAN SECTION  1987  . COLONOSCOPY    . CPAP    . KNEE SURGERY Right 2006   R knee fx. patella  . LEEP    . stitches in foot Right     SOCIAL HISTORY: Social History   Tobacco Use  . Smoking status: Never Smoker  . Smokeless tobacco: Never Used  Substance Use Topics  . Alcohol use: Yes    Alcohol/week: 1.0 standard drinks    Types: 1 Glasses of wine per week  . Drug use: No    FAMILY HISTORY: Family History  Problem Relation Age of Onset  . Hyperlipidemia Mother   . Dementia Mother   .  Obesity Mother   . Hyperlipidemia Father   . Heart disease Father        bypass, CAD  . Sleep apnea Father   . Hyperlipidemia Sister   . Diabetes Sister        type 2  . Colon polyps Sister   . Dementia Maternal Grandmother        alzheimer  . Alcohol abuse Maternal Grandfather   . Diabetes Paternal Grandmother        type 2?  . Colon cancer Neg Hx   . Esophageal cancer Neg Hx   . Rectal cancer Neg Hx   . Stomach cancer Neg Hx     ROS: Review of Systems  Constitutional: Positive for weight loss.  Cardiovascular: Negative for chest pain.  Gastrointestinal: Negative for diarrhea, nausea and vomiting.  Genitourinary:       Negative for polydipsia   Endo/Heme/Allergies: Negative for polydipsia.       Negative for hypoglycemia Negative for Polyphagia    PHYSICAL EXAM: Blood pressure (!) 146/73, pulse 63, temperature 98.1 F (36.7 C), temperature source Oral, height 5\' 5"  (1.651 m), weight 170 lb (77.1 kg), last menstrual period 04/04/2000, SpO2 99 %. Body mass index is 28.29 kg/m. Physical Exam Vitals signs reviewed.  Constitutional:      Appearance: Normal appearance. She is obese.  Cardiovascular:     Rate and Rhythm: Normal rate.     Pulses: Normal pulses.  Pulmonary:     Effort: Pulmonary effort is normal.  Musculoskeletal: Normal range of motion.  Skin:     General: Skin is warm and dry.  Neurological:     Mental Status: She is alert and oriented to person, place, and time.  Psychiatric:        Mood and Affect: Mood normal.        Behavior: Behavior normal.     RECENT LABS AND TESTS: BMET    Component Value Date/Time   NA 142 04/10/2018 0820   K 4.4 04/10/2018 0820   CL 102 04/10/2018 0820   CO2 23 04/10/2018 0820   GLUCOSE 95 04/10/2018 0820   GLUCOSE 81 07/31/2017 1411   BUN 20 04/10/2018 0820   CREATININE 0.71 04/10/2018 0820   CALCIUM 10.0 04/10/2018 0820   GFRNONAA 87 04/10/2018 0820   GFRAA 100 04/10/2018 0820   Lab Results  Component Value Date   HGBA1C 5.8 (H) 04/10/2018   HGBA1C 5.8 (H) 11/30/2017   HGBA1C 5.7 (H) 07/20/2017   HGBA1C 5.8 (H) 01/10/2017   HGBA1C 6.1 08/05/2016   Lab Results  Component Value Date   INSULIN 6.1 04/10/2018   INSULIN 9.6 11/30/2017   INSULIN 9.7 07/20/2017   INSULIN 11.7 01/10/2017   CBC    Component Value Date/Time   WBC 6.2 01/10/2017 0938   WBC 7.4 08/05/2016 0956   RBC 4.51 01/10/2017 0938   RBC 4.56 08/05/2016 0956   HGB 13.2 01/10/2017 0938   HCT 41.3 01/10/2017 0938   PLT 308.0 08/05/2016 0956   MCV 92 01/10/2017 0938   MCH 29.3 01/10/2017 0938   MCHC 32.0 01/10/2017 0938   MCHC 33.6 08/05/2016 0956   RDW 14.0 01/10/2017 0938   LYMPHSABS 2.1 01/10/2017 0938   MONOABS 0.7 08/05/2016 0956   EOSABS 0.2 01/10/2017 0938   BASOSABS 0.1 01/10/2017 0938   Iron/TIBC/Ferritin/ %Sat No results found for: IRON, TIBC, FERRITIN, IRONPCTSAT Lipid Panel     Component Value Date/Time   CHOL 273 (H) 04/10/2018 0820  TRIG 106 04/10/2018 0820   TRIG 94 04/07/2006 1019   HDL 59 04/10/2018 0820   CHOLHDL 4.1 07/20/2017 1229   CHOLHDL 4 08/05/2016 0956   VLDL 20.0 08/05/2016 0956   LDLCALC 193 (H) 04/10/2018 0820   LDLDIRECT 145.4 11/14/2012 0823   Hepatic Function Panel     Component Value Date/Time   PROT 6.6 04/10/2018 0820   ALBUMIN 4.2 04/10/2018 0820   AST 19  04/10/2018 0820   ALT 22 04/10/2018 0820   ALKPHOS 87 04/10/2018 0820   BILITOT 0.4 04/10/2018 0820   BILIDIR 0.1 10/29/2013 0831      Component Value Date/Time   TSH 1.020 01/10/2017 0938   TSH 1.21 08/05/2016 0956   TSH 1.56 07/06/2015 0936      OBESITY BEHAVIORAL INTERVENTION VISIT  Today's visit was # 26   Starting weight: 185 lbs Starting date: 01/10/2017 Today's weight :: 170 lbs Today's date: 04/24/2018 Total lbs lost to date: 15 At least 15 minutes were spent on discussing the following behavioral intervention visit.   ASK: We discussed the diagnosis of obesity with Cindy [name] today and Cindy [name] agreed to give [name] permission to discuss obesity behavioral modification therapy today.  ASSESS: Cindy [name] has the diagnosis of obesity and her BMI today is 28.29 Cindy [name] is in the action stage of change   ADVISE: Cindy [name] was educated on the multiple health risks of obesity as well as the benefit of weight loss to improve her health. She was advised of the need for long term treatment and the importance of lifestyle modifications to improve her current health and to decrease her risk of future health problems.  AGREE: Multiple dietary modification options and treatment options were discussed and  Cindy [name] agreed to follow the recommendations documented in the above note.  ARRANGE: Cindy [name] was educated on the importance of frequent visits to treat obesity as outlined per CMS and USPSTF guidelines and agreed to schedule her next follow up appointment today.  I, Tammy wysor, am acting as transcriptionist for Abby Potash, PA-C I, Abby Potash, PA-C have reviewed above note and agree with its content

## 2018-05-08 ENCOUNTER — Ambulatory Visit (INDEPENDENT_AMBULATORY_CARE_PROVIDER_SITE_OTHER): Payer: Medicare HMO | Admitting: Physician Assistant

## 2018-05-08 ENCOUNTER — Encounter (INDEPENDENT_AMBULATORY_CARE_PROVIDER_SITE_OTHER): Payer: Self-pay | Admitting: Physician Assistant

## 2018-05-08 VITALS — BP 143/80 | HR 60 | Temp 98.0°F | Ht 65.0 in | Wt 170.0 lb

## 2018-05-08 DIAGNOSIS — Z6830 Body mass index (BMI) 30.0-30.9, adult: Secondary | ICD-10-CM

## 2018-05-08 DIAGNOSIS — E669 Obesity, unspecified: Secondary | ICD-10-CM

## 2018-05-08 DIAGNOSIS — R7303 Prediabetes: Secondary | ICD-10-CM

## 2018-05-08 NOTE — Progress Notes (Signed)
Office: [phone]  /  Fax: [phone]   HPI:   Chief Complaint: OBESITY Azalee is here to discuss her progress with her obesity treatment plan. She is on the [place] and is following her eating plan approximately 60 % of the time. She states she is walking 45 minutes 1 times per week. Jahni reports that due to eating out a great deal, it is tough to follow the plan. She is frustrated with the lack of weight loss  Her weight is 170 lb (77.1 kg) today and has had a weight loss of 0 pounds over a period of 2 weeks since her last visit. She has lost 156 lbs since starting treatment with [name].  Pre-Diabetes Shacoria has a diagnosis of prediabetes based on her elevated Hgb A1c and was informed this puts her at greater risk of developing diabetes. She is taking metformin currently and continues to work on diet and exercise to decrease risk of diabetes. She denies nausea or hypoglycemia. She sometimes forgets to take her evening dose. She denies polyphagia.   ASSESSMENT AND PLAN:  Prediabetes  Class 1 obesity with serious comorbidity and body mass index (BMI) of 30.0 to 30.9 in adult, unspecified obesity type - Starting BMI greater then 30  PLAN:  Pre-Diabetes Nelani will continue to work on weight loss, exercise, and decreasing simple carbohydrates in her diet to help decrease the risk of diabetes. We dicussed metformin including benefits and risks. She was informed that eating too many simple carbohydrates or too many calories at one sitting increases the likelihood of GI side effects. Naraya will continue taking metformin for now and a prescription was not written today. Brayla agrees to follow up with our clinic in 2 weeks.   I spent > than 50% of the 15 minute visit on counseling as documented in the note.  Obesity Glynn is currently in the action stage of change. As such, her goal is to continue with weight loss efforts She has agreed to follow the [place] has been  instructed to work up to a goal of 150 minutes of combined cardio and strengthening exercise per week for weight loss and overall health benefits. We discussed the following Behavioral Modification Strategies today: decrease eating out and work on meal planning and easy cooking plans  Shellie has agreed to follow up with our clinic in 2 weeks. She was informed of the importance of frequent follow up visits to maximize her success with intensive lifestyle modifications for her multiple health conditions.  ALLERGIES: Allergies  Allergen Reactions  . Codeine Other (See Comments)    hallucinations  . Crestor [Rosuvastatin]     REACTION: myalgias  . Other Other (See Comments)    NOVACAINE- blisters left side of mouth after receiving   . Amoxicillin Rash  . Erythromycin Other (See Comments)    cramps  . Sulfa Antibiotics Rash  . Sulfonamide Derivatives Rash    MEDICATIONS: Current Outpatient Medications on File Prior to Visit  Medication Sig Dispense Refill  . aspirin 81 MG tablet Take 81 mg by mouth daily.    . cholecalciferol (VITAMIN D) 1000 units tablet Take 2,000 Units by mouth daily.     . cycloSPORINE (RESTASIS) 0.05 % ophthalmic emulsion Place 1 drop into both eyes daily as needed. Reported on 07/09/2015    . DULoxetine (CYMBALTA) 30 MG capsule TAKE ONE CAPSULE BY MOUTH EVERY OTHER DAY 30 capsule 0  . ezetimibe (ZETIA) 10 MG tablet Take 1 tablet (10 mg total)  by mouth daily. 30 tablet 0  . fluticasone (FLONASE) 50 MCG/ACT nasal spray Place 1 spray into both nostrils as needed.     Marland Kitchen levocetirizine (XYZAL) 5 MG tablet Take 5 mg by mouth as needed.   3  . metFORMIN (GLUCOPHAGE) 500 MG tablet TAKE 1 TABLET TWICE A DAY WITH A MEAL 60 tablet 0  . pravastatin (PRAVACHOL) 80 MG tablet Take 1 tablet (80 mg total) by mouth daily. 90 tablet 1  . Turmeric 500 MG CAPS Take by mouth.     No current facility-administered medications on file prior to visit.     PAST MEDICAL HISTORY: Past  Medical History:  Diagnosis Date  . Abnormal liver function tests 07/19/2015  . Allergic state 02/01/2010   Qualifier: Diagnosis of  By: Regis Bill MD, Standley Brooking   . Allergy   . Anxiety   . Back pain   . Chicken pox 73 yrs old  . Depressive disorder, not elsewhere classified   . Diabetes ([place])    diet controlled- no medicines   . Fibroid   . History of fractured kneecap    right   . Hyperglycemia   . Hyperlipidemia   . Infertility, female   . Joint pain   . Knee pain, left 10/05/2011   Follows with Dr Margarette [place]  . Measles as a child  . Medicare annual wellness visit, subsequent 07/06/2014  . OA (osteoarthritis) of knee    left knee  . Obesity 10/05/2011  . Obesity 10/05/2011  . Osteoarthritis   . Osteopenia 06/26/2014  . Other and unspecified hyperlipidemia   . Overweight 10/05/2011  . Overweight(278.02) 10/05/2011  . Sleep apnea     On CPAP  . Sleep apnea   . Stye 11/19/2012  . Syncope 01/26/2017  . Thyroid disease    h/o hyperthyroid treated with radioactive iodine? from 16 to 20  . Type 2 diabetes mellitus with hyperlipidemia ([place])   . Varicose vein of leg     PAST SURGICAL HISTORY: Past Surgical History:  Procedure Laterality Date  . BACK SURGERY     L5 discectomy, 2002. helpful  . CERVICAL BIOPSY  W/ LOOP ELECTRODE EXCISION    . CESAREAN SECTION  1987  . COLONOSCOPY    . CPAP    . KNEE SURGERY Right 2006   R knee fx. patella  . LEEP    . stitches in foot Right     SOCIAL HISTORY: Social History   Tobacco Use  . Smoking status: Never Smoker  . Smokeless tobacco: Never Used  Substance Use Topics  . Alcohol use: Yes    Alcohol/week: 1.0 standard drinks    Types: 1 Glasses of wine per week  . Drug use: No    FAMILY HISTORY: Family History  Problem Relation Age of Onset  . Hyperlipidemia Mother   . Dementia Mother   . Obesity Mother   . Hyperlipidemia Father   . Heart disease Father        bypass, CAD  . Sleep apnea Father   . Hyperlipidemia Sister   .  Diabetes Sister        type 2  . Colon polyps Sister   . Dementia Maternal Grandmother        alzheimer  . Alcohol abuse Maternal Grandfather   . Diabetes Paternal Grandmother        type 2?  . Colon cancer Neg Hx   . Esophageal cancer Neg Hx   . Rectal cancer Neg Hx   .  Stomach cancer Neg Hx     ROS: Review of Systems  Constitutional: Negative for weight loss.  Gastrointestinal: Negative for diarrhea, nausea and vomiting.  Genitourinary:       Negative for polyuria  Endo/Heme/Allergies:       Negative for hypoglycemia Negative for polyphagia    PHYSICAL EXAM: Blood pressure (!) 143/80, pulse 60, temperature 98 F (36.7 C), temperature source Oral, height 5\' 5"  (1.651 m), weight 170 lb (77.1 kg), last menstrual period 04/04/2000, SpO2 99 %. Body mass index is 28.29 kg/m. Physical Exam Vitals signs reviewed.  Constitutional:      Appearance: Normal appearance. She is obese.  Cardiovascular:     Rate and Rhythm: Normal rate.     Pulses: Normal pulses.  Pulmonary:     Effort: Pulmonary effort is normal.  Musculoskeletal: Normal range of motion.  Skin:    General: Skin is warm and dry.  Neurological:     Mental Status: She is alert and oriented to person, place, and time.  Psychiatric:        Mood and Affect: Mood normal.        Behavior: Behavior normal.     RECENT LABS AND TESTS: BMET    Component Value Date/Time   NA 142 04/10/2018 0820   K 4.4 04/10/2018 0820   CL 102 04/10/2018 0820   CO2 23 04/10/2018 0820   GLUCOSE 95 04/10/2018 0820   GLUCOSE 81 07/31/2017 1411   BUN 20 04/10/2018 0820   CREATININE 0.71 04/10/2018 0820   CALCIUM 10.0 04/10/2018 0820   GFRNONAA 87 04/10/2018 0820   GFRAA 100 04/10/2018 0820   Lab Results  Component Value Date   HGBA1C 5.8 (H) 04/10/2018   HGBA1C 5.8 (H) 11/30/2017   HGBA1C 5.7 (H) 07/20/2017   HGBA1C 5.8 (H) 01/10/2017   HGBA1C 6.1 08/05/2016   Lab Results  Component Value Date   INSULIN 6.1 04/10/2018    INSULIN 9.6 11/30/2017   INSULIN 9.7 07/20/2017   INSULIN 11.7 01/10/2017   CBC    Component Value Date/Time   WBC 6.2 01/10/2017 0938   WBC 7.4 08/05/2016 0956   RBC 4.51 01/10/2017 0938   RBC 4.56 08/05/2016 0956   HGB 13.2 01/10/2017 0938   HCT 41.3 01/10/2017 0938   PLT 308.0 08/05/2016 0956   MCV 92 01/10/2017 0938   MCH 29.3 01/10/2017 0938   MCHC 32.0 01/10/2017 0938   MCHC 33.6 08/05/2016 0956   RDW 14.0 01/10/2017 0938   LYMPHSABS 2.1 01/10/2017 0938   MONOABS 0.7 08/05/2016 0956   EOSABS 0.2 01/10/2017 0938   BASOSABS 0.1 01/10/2017 0938   Iron/TIBC/Ferritin/ %Sat No results found for: IRON, TIBC, FERRITIN, IRONPCTSAT Lipid Panel     Component Value Date/Time   CHOL 273 (H) 04/10/2018 0820   TRIG 106 04/10/2018 0820   TRIG 94 04/07/2006 1019   HDL 59 04/10/2018 0820   CHOLHDL 4.1 07/20/2017 1229   CHOLHDL 4 08/05/2016 0956   VLDL 20.0 08/05/2016 0956   LDLCALC 193 (H) 04/10/2018 0820   LDLDIRECT 145.4 11/14/2012 0823   Hepatic Function Panel     Component Value Date/Time   PROT 6.6 04/10/2018 0820   ALBUMIN 4.2 04/10/2018 0820   AST 19 04/10/2018 0820   ALT 22 04/10/2018 0820   ALKPHOS 87 04/10/2018 0820   BILITOT 0.4 04/10/2018 0820   BILIDIR 0.1 10/29/2013 0831      Component Value Date/Time   TSH 1.020 01/10/2017 0938   TSH 1.21  08/05/2016 0956   TSH 1.56 07/06/2015 0936      OBESITY BEHAVIORAL INTERVENTION VISIT  Today's visit was # 27   Starting weight: 185 lbs Starting date: 01/10/2018 Today's weight :: 170 lbs Today's date: 05/08/2018 Total lbs lost to date: 15   ASK: We discussed the diagnosis of obesity with Lurlean Leyden today and Teale agreed to give [name] permission to discuss obesity behavioral modification therapy today.  ASSESS: Caya has the diagnosis of obesity and her BMI today is 28.29 Sherlyne is in the action stage of change   ADVISE: Rosamaria was educated on the multiple health risks of obesity as well as the benefit  of weight loss to improve her health. She was advised of the need for long term treatment and the importance of lifestyle modifications to improve her current health and to decrease her risk of future health problems.  AGREE: Multiple dietary modification options and treatment options were discussed and  [place] agreed to follow the recommendations documented in the above note.  ARRANGE: Nakeysha was educated on the importance of frequent visits to treat obesity as outlined per CMS and USPSTF guidelines and agreed to schedule her next follow up appointment today.  I, Tammy Wysor, am acting as Location manager for [employer] I, Abby Potash, PA-C have reviewed above note and agree with its content

## 2018-05-15 DIAGNOSIS — D485 Neoplasm of uncertain behavior of skin: Secondary | ICD-10-CM | POA: Diagnosis not present

## 2018-05-15 DIAGNOSIS — L905 Scar conditions and fibrosis of skin: Secondary | ICD-10-CM | POA: Diagnosis not present

## 2018-05-22 ENCOUNTER — Ambulatory Visit (INDEPENDENT_AMBULATORY_CARE_PROVIDER_SITE_OTHER): Payer: Medicare HMO | Admitting: Physician Assistant

## 2018-05-22 ENCOUNTER — Encounter (INDEPENDENT_AMBULATORY_CARE_PROVIDER_SITE_OTHER): Payer: Self-pay | Admitting: Physician Assistant

## 2018-05-22 VITALS — BP 121/52 | HR 66 | Temp 97.5°F | Ht 65.0 in | Wt 169.0 lb

## 2018-05-22 DIAGNOSIS — E7849 Other hyperlipidemia: Secondary | ICD-10-CM | POA: Diagnosis not present

## 2018-05-22 DIAGNOSIS — E669 Obesity, unspecified: Secondary | ICD-10-CM | POA: Diagnosis not present

## 2018-05-22 DIAGNOSIS — F339 Major depressive disorder, recurrent, unspecified: Secondary | ICD-10-CM | POA: Diagnosis not present

## 2018-05-22 DIAGNOSIS — Z6830 Body mass index (BMI) 30.0-30.9, adult: Secondary | ICD-10-CM

## 2018-05-22 DIAGNOSIS — R69 Illness, unspecified: Secondary | ICD-10-CM | POA: Diagnosis not present

## 2018-05-22 MED ORDER — DULOXETINE HCL 30 MG PO CPEP: 30.0000 mg | ORAL_CAPSULE | ORAL | 0 refills | Status: DC

## 2018-05-22 NOTE — Progress Notes (Signed)
Office: [phone]  /  Fax: [phone]   HPI:   Chief Complaint: OBESITY Cindy [name] is here to discuss her progress with her obesity treatment plan. She is on the [place] plan and is following her eating plan approximately 70% of the time. She states she is doing aerobic dancing 60 minutes 2 times per week. Cindy [name] did well with weight loss. She reports that she may be overeating her snack calories or under eating her protein. She is frustrated with lack of weight loss.  Her weight is 169 lb (76.7 kg) today and has had a weight loss of 1 pound over a period of 2 weeks since her last visit. She has lost 16 lbs since starting treatment with [name].  Hyperlipidemia Cindy [name] has hyperlipidemia and is on pravastatin. She has been trying to improve her cholesterol levels with intensive lifestyle modification including a low saturated fat diet, exercise and weight loss. She denies any chest pain or myalgias.  Depression with emotional eating behaviors Cindy [name] is struggling with emotional eating and using food for comfort to the extent that it is negatively impacting her health. She often snacks when she is not hungry. Cindy [name] sometimes feels she is out of control and then feels guilty that she made poor food choices. She has been working on behavior modification techniques to help reduce her emotional eating. She reports no cravings and shows no sign of suicidal or homicidal ideations.  Depression screen [facility] 2/9 07/31/2017 01/10/2017 07/25/2016 02/11/2016 12/04/2014  Decreased Interest 0 1 0 0 0  Down, Depressed, Hopeless 0 2 0 0 0  PHQ - 2 Score 0 3 0 0 0  Altered sleeping - 0 - - -  Tired, decreased energy - 1 - - -  Change in appetite - 3 - - -  Feeling bad or failure about yourself  - 2 - - -  Trouble concentrating - 0 - - -  Moving slowly or fidgety/restless - 0 - - -  Suicidal thoughts - 0 - - -  PHQ-9 Score - 9 - - -  Difficult doing work/chores - Not difficult at all - - -   ASSESSMENT AND  PLAN:  Other hyperlipidemia  Depression, recurrent (HCC) - with emotional eating - Plan: DULoxetine (CYMBALTA) 30 MG capsule  Class 1 obesity with serious comorbidity and body mass index (BMI) of 30.0 to 30.9 in adult, unspecified obesity type  PLAN:  Hyperlipidemia Cindy [name] was informed of the American Heart Association Guidelines emphasizing intensive lifestyle modifications as the first line treatment for hyperlipidemia. We discussed many lifestyle modifications today in depth, and Cindy [name] will continue to work on decreasing saturated fats such as fatty red meat, butter and many fried foods. She will also increase vegetables and lean protein in her diet, continue to work on exercise and weight loss efforts, and will continue pravastatin. Cindy [name] agrees to follow-up with our clinic in 2 weeks.  Depression with Emotional Eating Behaviors We discussed behavior modification techniques today to help Cindy [name] deal with her emotional eating and depression. She was given a refill of her Cymbalta #30 with no refills and agrees to follow-up with our clinic in 2 weeks.  Obesity Cindy [name] is currently in the action stage of change. As such, her goal is to continue with weight loss efforts. She has agreed to keep a food journal with 1200 calories and 80 grams of protein.  Cindy [name] has been instructed to work up to a goal of 150 minutes of combined cardio and strengthening exercise per  week for weight loss and overall health benefits. We discussed the following Behavioral Modification Strategies today: work on meal planning and easy cooking plans and planning for success.  Cindy [name] has agreed to follow-up with our clinic in 2 weeks. She was informed of the importance of frequent follow up visits to maximize her success with intensive lifestyle modifications for her multiple health conditions.  ALLERGIES: Allergies  Allergen Reactions  . Codeine Other (See Comments)    hallucinations  . Crestor [Rosuvastatin]      REACTION: myalgias  . Other Other (See Comments)    NOVACAINE- blisters left side of mouth after receiving   . Amoxicillin Rash  . Erythromycin Other (See Comments)    cramps  . Sulfa Antibiotics Rash  . Sulfonamide Derivatives Rash    MEDICATIONS: Current Outpatient Medications on File Prior to Visit  Medication Sig Dispense Refill  . aspirin 81 MG tablet Take 81 mg by mouth daily.    . cholecalciferol (VITAMIN D) 1000 units tablet Take 2,000 Units by mouth daily.     . cycloSPORINE (RESTASIS) 0.05 % ophthalmic emulsion Place 1 drop into both eyes daily as needed. Reported on 07/09/2015    . ezetimibe (ZETIA) 10 MG tablet Take 1 tablet (10 mg total) by mouth daily. 30 tablet 0  . fluticasone (FLONASE) 50 MCG/ACT nasal spray Place 1 spray into both nostrils as needed.     Marland Kitchen levocetirizine (XYZAL) 5 MG tablet Take 5 mg by mouth as needed.   3  . metFORMIN (GLUCOPHAGE) 500 MG tablet TAKE 1 TABLET TWICE A DAY WITH A MEAL 60 tablet 0  . pravastatin (PRAVACHOL) 80 MG tablet Take 1 tablet (80 mg total) by mouth daily. 90 tablet 1  . Turmeric 500 MG CAPS Take by mouth.     No current facility-administered medications on file prior to visit.     PAST MEDICAL HISTORY: Past Medical History:  Diagnosis Date  . Abnormal liver function tests 07/19/2015  . Allergic state 02/01/2010   Qualifier: Diagnosis of  By: Regis Bill MD, Standley Brooking   . Allergy   . Anxiety   . Back pain   . Chicken pox 73 yrs old  . Depressive disorder, not elsewhere classified   . Diabetes ([place])    diet controlled- no medicines   . Fibroid   . History of fractured kneecap    right   . Hyperglycemia   . Hyperlipidemia   . Infertility, female   . Joint pain   . Knee pain, left 10/05/2011   Follows with Dr Margarette [place]  . Measles as a child  . Medicare annual wellness visit, subsequent 07/06/2014  . OA (osteoarthritis) of knee    left knee  . Obesity 10/05/2011  . Obesity 10/05/2011  . Osteoarthritis   . Osteopenia 06/26/2014   . Other and unspecified hyperlipidemia   . Overweight 10/05/2011  . Overweight(278.02) 10/05/2011  . Sleep apnea     On CPAP  . Sleep apnea   . Stye 11/19/2012  . Syncope 01/26/2017  . Thyroid disease    h/o hyperthyroid treated with radioactive iodine? from 16 to 20  . Type 2 diabetes mellitus with hyperlipidemia ([place])   . Varicose vein of leg     PAST SURGICAL HISTORY: Past Surgical History:  Procedure Laterality Date  . BACK SURGERY     L5 discectomy, 2002. helpful  . CERVICAL BIOPSY  W/ LOOP ELECTRODE EXCISION    . CESAREAN SECTION  1987  . COLONOSCOPY    .  CPAP    . KNEE SURGERY Right 2006   R knee fx. patella  . LEEP    . stitches in foot Right     SOCIAL HISTORY: Social History   Tobacco Use  . Smoking status: Never Smoker  . Smokeless tobacco: Never Used  Substance Use Topics  . Alcohol use: Yes    Alcohol/week: 1.0 standard drinks    Types: 1 Glasses of wine per week  . Drug use: No    FAMILY HISTORY: Family History  Problem Relation Age of Onset  . Hyperlipidemia Mother   . Dementia Mother   . Obesity Mother   . Hyperlipidemia Father   . Heart disease Father        bypass, CAD  . Sleep apnea Father   . Hyperlipidemia Sister   . Diabetes Sister        type 2  . Colon polyps Sister   . Dementia Maternal Grandmother        alzheimer  . Alcohol abuse Maternal Grandfather   . Diabetes Paternal Grandmother        type 2?  . Colon cancer Neg Hx   . Esophageal cancer Neg Hx   . Rectal cancer Neg Hx   . Stomach cancer Neg Hx    ROS: Review of Systems  Constitutional: Positive for weight loss.  Cardiovascular: Negative for chest pain.  Musculoskeletal: Negative for myalgias.       Negative for muscle weakness.  Endo/Heme/Allergies:       Negative for hypoglycemia.  Psychiatric/Behavioral: Negative for suicidal ideas.       Negative for homicidal ideas.   PHYSICAL EXAM: Blood pressure (!) 121/52, pulse 66, temperature (!) 97.5 F (36.4 C),  temperature source Oral, height 5\' 5"  (1.651 m), weight 169 lb (76.7 kg), last menstrual period 04/04/2000, SpO2 99 %. Body mass index is 28.12 kg/m. Physical Exam Vitals signs reviewed.  Constitutional:      Appearance: Normal appearance. She is obese.  Cardiovascular:     Rate and Rhythm: Normal rate.     Pulses: Normal pulses.  Pulmonary:     Effort: Pulmonary effort is normal.     Breath sounds: Normal breath sounds.  Musculoskeletal: Normal range of motion.  Skin:    General: Skin is warm and dry.  Neurological:     Mental Status: She is alert and oriented to person, place, and time.  Psychiatric:        Behavior: Behavior normal.        Thought Content: Thought content does not include homicidal or suicidal ideation.   RECENT LABS AND TESTS: BMET    Component Value Date/Time   NA 142 04/10/2018 0820   K 4.4 04/10/2018 0820   CL 102 04/10/2018 0820   CO2 23 04/10/2018 0820   GLUCOSE 95 04/10/2018 0820   GLUCOSE 81 07/31/2017 1411   BUN 20 04/10/2018 0820   CREATININE 0.71 04/10/2018 0820   CALCIUM 10.0 04/10/2018 0820   GFRNONAA 87 04/10/2018 0820   GFRAA 100 04/10/2018 0820   Lab Results  Component Value Date   HGBA1C 5.8 (H) 04/10/2018   HGBA1C 5.8 (H) 11/30/2017   HGBA1C 5.7 (H) 07/20/2017   HGBA1C 5.8 (H) 01/10/2017   HGBA1C 6.1 08/05/2016   Lab Results  Component Value Date   INSULIN 6.1 04/10/2018   INSULIN 9.6 11/30/2017   INSULIN 9.7 07/20/2017   INSULIN 11.7 01/10/2017   CBC    Component Value Date/Time   WBC  6.2 01/10/2017 0938   WBC 7.4 08/05/2016 0956   RBC 4.51 01/10/2017 0938   RBC 4.56 08/05/2016 0956   HGB 13.2 01/10/2017 0938   HCT 41.3 01/10/2017 0938   PLT 308.0 08/05/2016 0956   MCV 92 01/10/2017 0938   MCH 29.3 01/10/2017 0938   MCHC 32.0 01/10/2017 0938   MCHC 33.6 08/05/2016 0956   RDW 14.0 01/10/2017 0938   LYMPHSABS 2.1 01/10/2017 0938   MONOABS 0.7 08/05/2016 0956   EOSABS 0.2 01/10/2017 0938   BASOSABS 0.1  01/10/2017 0938   Iron/TIBC/Ferritin/ %Sat No results found for: IRON, TIBC, FERRITIN, IRONPCTSAT Lipid Panel     Component Value Date/Time   CHOL 273 (H) 04/10/2018 0820   TRIG 106 04/10/2018 0820   TRIG 94 04/07/2006 1019   HDL 59 04/10/2018 0820   CHOLHDL 4.1 07/20/2017 1229   CHOLHDL 4 08/05/2016 0956   VLDL 20.0 08/05/2016 0956   LDLCALC 193 (H) 04/10/2018 0820   LDLDIRECT 145.4 11/14/2012 0823   Hepatic Function Panel     Component Value Date/Time   PROT 6.6 04/10/2018 0820   ALBUMIN 4.2 04/10/2018 0820   AST 19 04/10/2018 0820   ALT 22 04/10/2018 0820   ALKPHOS 87 04/10/2018 0820   BILITOT 0.4 04/10/2018 0820   BILIDIR 0.1 10/29/2013 0831      Component Value Date/Time   TSH 1.020 01/10/2017 0938   TSH 1.21 08/05/2016 0956   TSH 1.56 07/06/2015 0936    Ref. Range 04/10/2018 08:20  Vitamin D, 25-Hydroxy Latest Ref Range: 30.0 - 100.0 ng/mL 57.2   OBESITY BEHAVIORAL INTERVENTION VISIT  Today's visit was #28  Starting weight: 185 lbs Starting date: 01/10/2017 Today's weight: 169 lbs Today's date: 05/22/2018 Total lbs lost to date: 16 At least 15 minutes were spent on discussing the following behavioral intervention visit.  ASK: We discussed the diagnosis of obesity with Cindy [name] today and Cindy [name] agreed to give [name] permission to discuss obesity behavioral modification therapy today.  ASSESS: Cindy [name] has the diagnosis of obesity and her BMI today is 28.12. Cindy [name] is in the action stage of change.   ADVISE: Cindy [name] was educated on the multiple health risks of obesity as well as the benefit of weight loss to improve her health. She was advised of the need for long term treatment and the importance of lifestyle modifications to improve her current health and to decrease her risk of future health problems.  AGREE: Multiple dietary modification options and treatment options were discussed and  Cindy [name] agreed to follow the recommendations documented in the above  note.  ARRANGE: Cindy [name] was educated on the importance of frequent visits to treat obesity as outlined per CMS and USPSTF guidelines and agreed to schedule her next follow up appointment today.  Migdalia Dk, am acting as transcriptionist for Abby Potash, PA-C I, Abby Potash, PA-C have reviewed above note and agree with its content

## 2018-05-31 ENCOUNTER — Ambulatory Visit: Payer: Self-pay | Admitting: Nurse Practitioner

## 2018-05-31 ENCOUNTER — Other Ambulatory Visit (INDEPENDENT_AMBULATORY_CARE_PROVIDER_SITE_OTHER): Payer: Self-pay | Admitting: Physician Assistant

## 2018-05-31 VITALS — BP 112/64 | HR 69 | Temp 97.9°F | Resp 16 | Wt 175.0 lb

## 2018-05-31 DIAGNOSIS — E7849 Other hyperlipidemia: Secondary | ICD-10-CM

## 2018-05-31 DIAGNOSIS — B9789 Other viral agents as the cause of diseases classified elsewhere: Secondary | ICD-10-CM

## 2018-05-31 DIAGNOSIS — J019 Acute sinusitis, unspecified: Secondary | ICD-10-CM

## 2018-05-31 MED ORDER — DOXYCYCLINE HYCLATE 100 MG PO TABS: 100.0000 mg | ORAL_TABLET | Freq: Two times a day (BID) | ORAL | 0 refills | Status: AC

## 2018-05-31 MED ORDER — FLUTICASONE PROPIONATE 50 MCG/ACT NA SUSP: 2.0000 | Freq: Every day | NASAL | 0 refills | Status: DC

## 2018-05-31 NOTE — Patient Instructions (Signed)
Sinusitis, Adult -Take medication as prescribed. I am providing a prescription for Doxycycline to be started on 06/06/2018 if your symptoms do not improve.   -I would also like you to purchase over-the-counter Claritin to take each morning.  You can continue taking your Zyrtec at bedtime.   -Use Flonase as directed. -Ibuprofen or Tylenol for pain, fever, or general discomfort. -Increase fluids. -Sleep elevated on at least 2 pillows at bedtime to help with cough. -Use a humidifier or vaporizer when at home and during sleep. -May use a teaspoon of honey or over-the-counter cough drops to help with cough. -May use normal saline nasal spray to help with nasal congestion throughout the day. -If you have to start the antibiotic and your symptoms do not improve, you will need to follow up with your PCP.   Sinusitis is inflammation of your sinuses. Sinuses are hollow spaces in the bones around your face. Your sinuses are located:  Around your eyes.  In the middle of your forehead.  Behind your nose.  In your cheekbones. Mucus normally drains out of your sinuses. When your nasal tissues become inflamed or swollen, mucus can become trapped or blocked. This allows bacteria, viruses, and fungi to grow, which leads to infection. Most infections of the sinuses are caused by a virus. Sinusitis can develop quickly. It can last for up to 4 weeks (acute) or for more than 12 weeks (chronic). Sinusitis often develops after a cold. What are the causes? This condition is caused by anything that creates swelling in the sinuses or stops mucus from draining. This includes:  Allergies.  Asthma.  Infection from bacteria or viruses.  Deformities or blockages in your nose or sinuses.  Abnormal growths in the nose (nasal polyps).  Pollutants, such as chemicals or irritants in the air.  Infection from fungi (rare). What increases the risk? You are more likely to develop this condition if you:  Have a  weak body defense system (immune system).  Do a lot of swimming or diving.  Overuse nasal sprays.  Smoke. What are the signs or symptoms? The main symptoms of this condition are pain and a feeling of pressure around the affected sinuses. Other symptoms include:  Stuffy nose or congestion.  Thick drainage from your nose.  Swelling and warmth over the affected sinuses.  Headache.  Upper toothache.  A cough that may get worse at night.  Extra mucus that collects in the throat or the back of the nose (postnasal drip).  Decreased sense of smell and taste.  Fatigue.  A fever.  Sore throat.  Bad breath. How is this diagnosed? This condition is diagnosed based on:  Your symptoms.  Your medical history.  A physical exam.  Tests to find out if your condition is acute or chronic. This may include: ? Checking your nose for nasal polyps. ? Viewing your sinuses using a device that has a light (endoscope). ? Testing for allergies or bacteria. ? Imaging tests, such as an MRI or CT scan. In rare cases, a bone biopsy may be done to rule out more serious types of fungal sinus disease. How is this treated? Treatment for sinusitis depends on the cause and whether your condition is chronic or acute.  If caused by a virus, your symptoms should go away on their own within 10 days. You may be given medicines to relieve symptoms. They include: ? Medicines that shrink swollen nasal passages (topical intranasal decongestants). ? Medicines that treat allergies (antihistamines). ? A spray  that eases inflammation of the nostrils (topical intranasal corticosteroids). ? Rinses that help get rid of thick mucus in your nose (nasal saline washes).  If caused by bacteria, your health care provider may recommend waiting to see if your symptoms improve. Most bacterial infections will get better without antibiotic medicine. You may be given antibiotics if you have: ? A severe infection. ? A weak  immune system.  If caused by narrow nasal passages or nasal polyps, you may need to have surgery. Follow these instructions at home: Medicines  Take, use, or apply over-the-counter and prescription medicines only as told by your health care provider. These may include nasal sprays.  If you were prescribed an antibiotic medicine, take it as told by your health care provider. Do not stop taking the antibiotic even if you start to feel better. Hydrate and humidify   Drink enough fluid to keep your urine pale yellow. Staying hydrated will help to thin your mucus.  Use a cool mist humidifier to keep the humidity level in your home above 50%.  Inhale steam for 10-15 minutes, 3-4 times a day, or as told by your health care provider. You can do this in the bathroom while a hot shower is running.  Limit your exposure to cool or dry air. Rest  Rest as much as possible.  Sleep with your head raised (elevated).  Make sure you get enough sleep each night. General instructions   Apply a warm, moist washcloth to your face 3-4 times a day or as told by your health care provider. This will help with discomfort.  Wash your hands often with soap and water to reduce your exposure to germs. If soap and water are not available, use hand sanitizer.  Do not smoke. Avoid being around people who are smoking (secondhand smoke).  Keep all follow-up visits as told by your health care provider. This is important. Contact a health care provider if:  You have a fever.  Your symptoms get worse.  Your symptoms do not improve within 10 days. Get help right away if:  You have a severe headache.  You have persistent vomiting.  You have severe pain or swelling around your face or eyes.  You have vision problems.  You develop confusion.  Your neck is stiff.  You have trouble breathing. Summary  Sinusitis is soreness and inflammation of your sinuses. Sinuses are hollow spaces in the bones around  your face.  This condition is caused by nasal tissues that become inflamed or swollen. The swelling traps or blocks the flow of mucus. This allows bacteria, viruses, and fungi to grow, which leads to infection.  If you were prescribed an antibiotic medicine, take it as told by your health care provider. Do not stop taking the antibiotic even if you start to feel better.  Keep all follow-up visits as told by your health care provider. This is important. This information is not intended to replace advice given to you by your health care provider. Make sure you discuss any questions you have with your health care provider. Document Released: 03/21/2005 Document Revised: 08/21/2017 Document Reviewed: 08/21/2017 Elsevier Interactive Patient Education  2019 [employer].

## 2018-05-31 NOTE — Progress Notes (Signed)
Subjective:    Patient ID: Cindy [name], female    DOB: 05-12-1945, 73 y.o.   MRN: [id number]  The patient is a 73 year old female who presents today for complaints of sinus pressure, nasal congestion and postnasal drip.  Patient states her symptoms started 3 days ago.  The patient denies fever, chills, ear pain/pressure, headache, purulent nasal drainage, productive cough, abdominal pain, nausea, vomiting or diarrhea.  The patient an underlying history of seasonal allergies and has been taking Zyrtec daily.  Patient has also been using cough drops.  Patient has a history of palpitations and is pre-diabetic.  Patient's last AIC in Oct/Nov was 5.7.  Patient is currently taking Metformin that was prescribed by her weight loss clinic.  Patient is also controlling her pre-diabetes with diet and exercise.  Sinusitis  This is a new problem. The current episode started in the past 7 days. The problem has been gradually worsening since onset. There has been no fever. Associated symptoms include congestion, coughing ( secondary to postnasal drip), headaches and sinus pressure. Pertinent negatives include no chills, ear pain, shortness of breath, sneezing or sore throat. Treatments tried: zyrtec and cough drops. The treatment provided no relief.   I have reviewed the patient's past medical history, current medications and allergies.  Review of Systems  Constitutional: Negative.  Negative for chills.  HENT: Positive for congestion, postnasal drip, sinus pressure and sinus pain. Negative for ear discharge, ear pain, sneezing and sore throat.   Eyes: Negative.   Respiratory: Positive for cough ( secondary to postnasal drip). Negative for shortness of breath and wheezing.   Cardiovascular: Negative.   Gastrointestinal: Negative.   Skin: Negative.   Allergic/Immunologic: Positive for environmental allergies.  Neurological: Positive for headaches.      Objective: Blood pressure 112/64, pulse 69,  temperature 97.9 F (36.6 C), resp. rate 16, weight 175 lb (79.4 kg), last menstrual period 04/04/2000, SpO2 99 %.   Physical Exam Vitals signs reviewed.  HENT:     Head: Normocephalic.     Right Ear: Tympanic membrane, ear canal and external ear normal.     Left Ear: Tympanic membrane, ear canal and external ear normal.     Nose: Mucosal edema, congestion (moderate) and rhinorrhea (clear drainage) present.     Right Turbinates: Enlarged and swollen.     Left Turbinates: Enlarged and swollen.     Right Sinus: Frontal sinus tenderness (right) present. No maxillary sinus tenderness.     Left Sinus: No maxillary sinus tenderness or frontal sinus tenderness.     Mouth/Throat:     Mouth: Mucous membranes are moist.     Pharynx: Oropharynx is clear. Uvula midline. Posterior oropharyngeal erythema present. No pharyngeal swelling, oropharyngeal exudate or uvula swelling.     Tonsils: No tonsillar exudate. Swelling: 0 on the right. 0 on the left.  Eyes:     Pupils: Pupils are equal, round, and reactive to light.  Neck:     Musculoskeletal: Normal range of motion and neck supple. No muscular tenderness.  Cardiovascular:     Rate and Rhythm: Normal rate and regular rhythm.     Pulses: Normal pulses.     Heart sounds: Normal heart sounds.  Pulmonary:     Effort: Pulmonary effort is normal. No respiratory distress.     Breath sounds: Normal breath sounds. No stridor. No wheezing, rhonchi or rales.  Abdominal:     General: Abdomen is flat. Bowel sounds are normal.     Tenderness: There  is no abdominal tenderness.  Skin:    General: Skin is warm and dry.     Capillary Refill: Capillary refill takes less than 2 seconds.  Neurological:     General: No focal deficit present.     Mental Status: She is alert and oriented to person, place, and time.     Cranial Nerves: No cranial nerve deficit.  Psychiatric:        Mood and Affect: Mood normal.        Thought Content: Thought content normal.        Assessment & Plan:   Exam findings, diagnosis etiology and medication use and indications reviewed with patient. Follow- Up and discharge instructions provided. No emergent/urgent issues found on exam. Patient's physical exam findings, symptom onset and symptoms are consistent with that of viral etiology at this time.  Patient has mild right frontal sinus pressure with her main complaint of postnasal drip.  I am unable to prescribe Sudafed as patient informs she has a history of palpitations so I am going to prescribe other symptomatic treatment to include flonase, Claritin in the am and Zyrtec at bedtime.  Informed patient that using normal saline will help with her viral sinusitis becoming bacterial as it will wash out the sinuses and nasal passages.  Because patient has a history of pre-diabetes and due to her advanced age, I am going to recommend she try symptomatic treatment for another 7 days to see if this will help.  I have provided the patient with a written prescription for Doxycycline to have filled on 06/06/2018 if she does not improve.  I explained the viral process to the patient and informed her clinical presentation and symptom duration do not warrant an antibiotic at this time, in addition to antibiotics not being helpful in the case of viral infections.  Patient was in agreement with this treatment plan.  Patient education was provided. Patient verbalized understanding of information provided and agrees with plan of care (POC), all questions answered. The patient is advised to call or return to clinic if condition does not see an improvement in symptoms, or to seek the care of the closest emergency department if condition worsens with the above plan.   1. Acute viral sinusitis  - fluticasone (FLONASE) 50 MCG/ACT nasal spray; Place 2 sprays into both nostrils daily for 10 days.  Dispense: 16 g; Refill: 0 - doxycycline (VIBRA-TABS) 100 MG tablet; Take 1 tablet (100 mg total) by mouth 2  (two) times daily for 7 days.  Dispense: 14 tablet; Refill: 0 (provided written prescription for the patient to have filled on 06/06/2018 if needed) -Take medication as prescribed. I am providing a prescription for Doxycycline to be started on 06/06/2018 if your symptoms do not improve.  -I would also like you to purchase over-the-counter Claritin to take each morning.  You can continue taking your Zyrtec at bedtime.  -Use Flonase as directed. -Ibuprofen or Tylenol for pain, fever, or general discomfort. -Increase fluids. -Sleep elevated on at least 2 pillows at bedtime to help with cough. -Use a humidifier or vaporizer when at home and during sleep. -May use a teaspoon of honey or over-the-counter cough drops to help with cough. -May use normal saline nasal spray to help with nasal congestion throughout the day. -If you have to start the antibiotic and your symptoms do not improve, you will need to follow up with your PCP.

## 2018-06-01 DIAGNOSIS — R05 Cough: Secondary | ICD-10-CM | POA: Diagnosis not present

## 2018-06-01 DIAGNOSIS — J111 Influenza due to unidentified influenza virus with other respiratory manifestations: Secondary | ICD-10-CM | POA: Diagnosis not present

## 2018-06-04 ENCOUNTER — Telehealth: Payer: Self-pay

## 2018-06-04 ENCOUNTER — Ambulatory Visit (INDEPENDENT_AMBULATORY_CARE_PROVIDER_SITE_OTHER): Payer: Medicare HMO | Admitting: Physician Assistant

## 2018-06-04 NOTE — Telephone Encounter (Signed)
Patient did not answered the phone 

## 2018-06-09 ENCOUNTER — Other Ambulatory Visit (INDEPENDENT_AMBULATORY_CARE_PROVIDER_SITE_OTHER): Payer: Self-pay | Admitting: Physician Assistant

## 2018-06-09 DIAGNOSIS — E119 Type 2 diabetes mellitus without complications: Secondary | ICD-10-CM

## 2018-06-19 ENCOUNTER — Ambulatory Visit (INDEPENDENT_AMBULATORY_CARE_PROVIDER_SITE_OTHER): Payer: Medicare HMO | Admitting: Physician Assistant

## 2018-06-20 ENCOUNTER — Other Ambulatory Visit (INDEPENDENT_AMBULATORY_CARE_PROVIDER_SITE_OTHER): Payer: Self-pay | Admitting: Physician Assistant

## 2018-06-20 DIAGNOSIS — E119 Type 2 diabetes mellitus without complications: Secondary | ICD-10-CM

## 2018-06-27 ENCOUNTER — Other Ambulatory Visit (INDEPENDENT_AMBULATORY_CARE_PROVIDER_SITE_OTHER): Payer: Self-pay | Admitting: Physician Assistant

## 2018-06-27 DIAGNOSIS — E7849 Other hyperlipidemia: Secondary | ICD-10-CM

## 2018-06-30 ENCOUNTER — Other Ambulatory Visit (INDEPENDENT_AMBULATORY_CARE_PROVIDER_SITE_OTHER): Payer: Self-pay | Admitting: Physician Assistant

## 2018-06-30 DIAGNOSIS — E119 Type 2 diabetes mellitus without complications: Secondary | ICD-10-CM

## 2018-07-02 ENCOUNTER — Encounter (INDEPENDENT_AMBULATORY_CARE_PROVIDER_SITE_OTHER): Payer: Self-pay

## 2018-07-12 DIAGNOSIS — G4733 Obstructive sleep apnea (adult) (pediatric): Secondary | ICD-10-CM | POA: Diagnosis not present

## 2018-07-18 ENCOUNTER — Other Ambulatory Visit: Payer: Self-pay

## 2018-07-18 ENCOUNTER — Ambulatory Visit (INDEPENDENT_AMBULATORY_CARE_PROVIDER_SITE_OTHER): Payer: Medicare HMO | Admitting: Physician Assistant

## 2018-07-18 ENCOUNTER — Encounter (INDEPENDENT_AMBULATORY_CARE_PROVIDER_SITE_OTHER): Payer: Self-pay | Admitting: Physician Assistant

## 2018-07-18 DIAGNOSIS — E669 Obesity, unspecified: Secondary | ICD-10-CM

## 2018-07-18 DIAGNOSIS — E7849 Other hyperlipidemia: Secondary | ICD-10-CM

## 2018-07-18 DIAGNOSIS — Z6830 Body mass index (BMI) 30.0-30.9, adult: Secondary | ICD-10-CM | POA: Diagnosis not present

## 2018-07-18 DIAGNOSIS — E119 Type 2 diabetes mellitus without complications: Secondary | ICD-10-CM | POA: Diagnosis not present

## 2018-07-18 MED ORDER — METFORMIN HCL 500 MG PO TABS: ORAL_TABLET | ORAL | 0 refills | Status: DC

## 2018-07-18 MED ORDER — EZETIMIBE 10 MG PO TABS: 10.0000 mg | ORAL_TABLET | Freq: Every day | ORAL | 0 refills | Status: DC

## 2018-07-18 NOTE — Progress Notes (Signed)
Office: [phone]  /  Fax: [phone] TeleHealth Visit:  Cindy [name] has verbally consented to this TeleHealth visit today. The patient is located at home, the provider is located at the [facility] office. The participants in this visit include the listed provider and patient. The visit was conducted today via Webex.  HPI:   Chief Complaint: OBESITY Cindy [name] is here to discuss her progress with her obesity treatment plan. She is keeping a food journal with 1200 calories and 80 grams of protein and is following hereating plan approximately 0% of the time. She states she is walking 40-45 minutes 4 times per week. Cindy [name] reports that she has not been journaling at all. She normally follows the Pescatarian plan while journaling, but reports she is bored in general with the plans. We were unable to weigh the patient today for this TeleHealth visit. She feels as if she has maintained her weight since her last visit. She has lost 16 lbs since starting treatment with [name].  Hyperlipidemia Cindy [name] has hyperlipidemia and has been trying to improve her cholesterol levels with intensive lifestyle modification including a low saturated fat diet, exercise and weight loss. She is on Zetia and denies any chest pain.  Pre-Diabetes Cindy [name] has a diagnosis of prediabetes based on her elevated Hgb A1c and was informed this puts her at greater risk of developing diabetes. She is taking metformin currently and continues to work on diet and exercise to decrease risk of diabetes. She denies nausea, vomiting, or diarrhea on metformin. No polyphagia.   ASSESSMENT AND PLAN:  Other hyperlipidemia - Plan: ezetimibe (ZETIA) 10 MG tablet  Type 2 diabetes mellitus without complication, without long-term current use of insulin ([place]) - Plan: metFORMIN (GLUCOPHAGE) 500 MG tablet  Class 1 obesity with serious comorbidity and body mass index (BMI) of 30.0 to 30.9 in adult, unspecified obesity type - Starting BMI  greater then 30  PLAN:  Hyperlipidemia Cindy [name] was informed of the American Heart Association Guidelines emphasizing intensive lifestyle modifications as the first line treatment for hyperlipidemia. We discussed many lifestyle modifications today in depth, and Cindy [name] will continue to work on decreasing saturated fats such as fatty red meat, butter and many fried foods. Cindy [name] was given a refill on her Zetia #30 with 2 refills and agrees to follow-up with our office in 2 weeks. She will also increase vegetables and lean protein in her diet and continue to work on exercise and weight loss efforts.  Pre-Diabetes Cindy [name] will continue to work on weight loss, exercise, and decreasing simple carbohydrates in her diet to help decrease the risk of diabetes. We dicussed metformin including benefits and risks. She was informed that eating too many simple carbohydrates or too many calories at one sitting increases the likelihood of GI side effects. Cindy [name] is currently taking metformin for now and a refill prescription was written today for #60 with 0 refills. Cindy [name] agrees to follow-up with our clinic in 2 weeks.  Obesity Cindy [name] is currently in the action stage of change. As such, her goal is to continue with weight loss efforts. She has agreed to keep a food journal with 1200 calories and 80 grams of protein.  Cindy [name] has been instructed to work up to a goal of 150 minutes of combined cardio and strengthening exercise per week for weight loss and overall health benefits. We discussed the following Behavioral Modification Strategies today: work on meal planning, easy cooking plans, and keeping healthy foods in the home.  Cindy [name] has  agreed to follow-up with our clinic in 2 weeks. She was informed of the importance of frequent follow-up visits to maximize her success with intensive lifestyle modifications for her multiple health conditions.  ALLERGIES: Allergies  Allergen Reactions  . Codeine Other (See Comments)     hallucinations  . Crestor [Rosuvastatin]     REACTION: myalgias  . Other Other (See Comments)    NOVACAINE- blisters left side of mouth after receiving   . Amoxicillin Rash  . Erythromycin Other (See Comments)    cramps  . Sulfa Antibiotics Rash  . Sulfonamide Derivatives Rash    MEDICATIONS: Current Outpatient Medications on File Prior to Visit  Medication Sig Dispense Refill  . aspirin 81 MG tablet Take 81 mg by mouth daily.    . cholecalciferol (VITAMIN D) 1000 units tablet Take 2,000 Units by mouth daily.     . cycloSPORINE (RESTASIS) 0.05 % ophthalmic emulsion Place 1 drop into both eyes daily as needed. Reported on 07/09/2015    . DULoxetine (CYMBALTA) 30 MG capsule Take 1 capsule (30 mg total) by mouth every other day. 30 capsule 0  . fluticasone (FLONASE) 50 MCG/ACT nasal spray Place 1 spray into both nostrils as needed.     . fluticasone (FLONASE) 50 MCG/ACT nasal spray Place 2 sprays into both nostrils daily for 10 days. 16 g 0  . levocetirizine (XYZAL) 5 MG tablet Take 5 mg by mouth as needed.   3  . pravastatin (PRAVACHOL) 80 MG tablet Take 1 tablet (80 mg total) by mouth daily. 90 tablet 1  . Turmeric 500 MG CAPS Take by mouth.     No current facility-administered medications on file prior to visit.     PAST MEDICAL HISTORY: Past Medical History:  Diagnosis Date  . Abnormal liver function tests 07/19/2015  . Allergic state 02/01/2010   Qualifier: Diagnosis of  By: Regis Bill MD, Standley Brooking   . Allergy   . Anxiety   . Back pain   . Chicken pox 73 yrs old  . Depressive disorder, not elsewhere classified   . Diabetes ([place])    diet controlled- no medicines   . Fibroid   . History of fractured kneecap    right   . Hyperglycemia   . Hyperlipidemia   . Infertility, female   . Joint pain   . Knee pain, left 10/05/2011   Follows with Dr Margarette Canada  . Measles as a child  . Medicare annual wellness visit, subsequent 07/06/2014  . OA (osteoarthritis) of knee    left knee  .  Obesity 10/05/2011  . Obesity 10/05/2011  . Osteoarthritis   . Osteopenia 06/26/2014  . Other and unspecified hyperlipidemia   . Overweight 10/05/2011  . Overweight(278.02) 10/05/2011  . Sleep apnea     On CPAP  . Sleep apnea   . Stye 11/19/2012  . Syncope 01/26/2017  . Thyroid disease    h/o hyperthyroid treated with radioactive iodine? from 16 to 20  . Type 2 diabetes mellitus with hyperlipidemia ([place])   . Varicose vein of leg     PAST SURGICAL HISTORY: Past Surgical History:  Procedure Laterality Date  . BACK SURGERY     L5 discectomy, 2002. helpful  . CERVICAL BIOPSY  W/ LOOP ELECTRODE EXCISION    . CESAREAN SECTION  1987  . COLONOSCOPY    . CPAP    . KNEE SURGERY Right 2006   R knee fx. patella  . LEEP    . stitches in foot  Right     SOCIAL HISTORY: Social History   Tobacco Use  . Smoking status: Never Smoker  . Smokeless tobacco: Never Used  Substance Use Topics  . Alcohol use: Yes    Alcohol/week: 1.0 standard drinks    Types: 1 Glasses of wine per week  . Drug use: No    FAMILY HISTORY: Family History  Problem Relation Age of Onset  . Hyperlipidemia Mother   . Dementia Mother   . Obesity Mother   . Hyperlipidemia Father   . Heart disease Father        bypass, CAD  . Sleep apnea Father   . Hyperlipidemia Sister   . Diabetes Sister        type 2  . Colon polyps Sister   . Dementia Maternal Grandmother        alzheimer  . Alcohol abuse Maternal Grandfather   . Diabetes Paternal Grandmother        type 2?  . Colon cancer Neg Hx   . Esophageal cancer Neg Hx   . Rectal cancer Neg Hx   . Stomach cancer Neg Hx    ROS: Review of Systems  Cardiovascular: Negative for chest pain.  Gastrointestinal: Negative for diarrhea, nausea and vomiting.  Endo/Heme/Allergies:       Negative for polyphagia.   PHYSICAL EXAM: Pt in no acute distress  RECENT LABS AND TESTS: BMET    Component Value Date/Time   NA 142 04/10/2018 0820   K 4.4 04/10/2018 0820   CL  102 04/10/2018 0820   CO2 23 04/10/2018 0820   GLUCOSE 95 04/10/2018 0820   GLUCOSE 81 07/31/2017 1411   BUN 20 04/10/2018 0820   CREATININE 0.71 04/10/2018 0820   CALCIUM 10.0 04/10/2018 0820   GFRNONAA 87 04/10/2018 0820   GFRAA 100 04/10/2018 0820   Lab Results  Component Value Date   HGBA1C 5.8 (H) 04/10/2018   HGBA1C 5.8 (H) 11/30/2017   HGBA1C 5.7 (H) 07/20/2017   HGBA1C 5.8 (H) 01/10/2017   HGBA1C 6.1 08/05/2016   Lab Results  Component Value Date   INSULIN 6.1 04/10/2018   INSULIN 9.6 11/30/2017   INSULIN 9.7 07/20/2017   INSULIN 11.7 01/10/2017   CBC    Component Value Date/Time   WBC 6.2 01/10/2017 0938   WBC 7.4 08/05/2016 0956   RBC 4.51 01/10/2017 0938   RBC 4.56 08/05/2016 0956   HGB 13.2 01/10/2017 0938   HCT 41.3 01/10/2017 0938   PLT 308.0 08/05/2016 0956   MCV 92 01/10/2017 0938   MCH 29.3 01/10/2017 0938   MCHC 32.0 01/10/2017 0938   MCHC 33.6 08/05/2016 0956   RDW 14.0 01/10/2017 0938   LYMPHSABS 2.1 01/10/2017 0938   MONOABS 0.7 08/05/2016 0956   EOSABS 0.2 01/10/2017 0938   BASOSABS 0.1 01/10/2017 0938   Iron/TIBC/Ferritin/ %Sat No results found for: IRON, TIBC, FERRITIN, IRONPCTSAT Lipid Panel     Component Value Date/Time   CHOL 273 (H) 04/10/2018 0820   TRIG 106 04/10/2018 0820   TRIG 94 04/07/2006 1019   HDL 59 04/10/2018 0820   CHOLHDL 4.1 07/20/2017 1229   CHOLHDL 4 08/05/2016 0956   VLDL 20.0 08/05/2016 0956   LDLCALC 193 (H) 04/10/2018 0820   LDLDIRECT 145.4 11/14/2012 0823   Hepatic Function Panel     Component Value Date/Time   PROT 6.6 04/10/2018 0820   ALBUMIN 4.2 04/10/2018 0820   AST 19 04/10/2018 0820   ALT 22 04/10/2018 0820   ALKPHOS  87 04/10/2018 0820   BILITOT 0.4 04/10/2018 0820   BILIDIR 0.1 10/29/2013 0831      Component Value Date/Time   TSH 1.020 01/10/2017 0938   TSH 1.21 08/05/2016 0956   TSH 1.56 07/06/2015 0936   Results for KAMRI, GOTSCH (MRN [id number]) as of 07/18/2018 11:44  Ref. Range  04/10/2018 08:20  Vitamin D, 25-Hydroxy Latest Ref Range: 30.0 - 100.0 ng/mL 57.2   I, Michaelene Song, am acting as Location manager for [employer], PA-C I, Abby Potash, PA-C have reviewed above note and agree with its content

## 2018-07-20 ENCOUNTER — Other Ambulatory Visit (INDEPENDENT_AMBULATORY_CARE_PROVIDER_SITE_OTHER): Payer: Self-pay | Admitting: Physician Assistant

## 2018-07-20 DIAGNOSIS — F339 Major depressive disorder, recurrent, unspecified: Secondary | ICD-10-CM

## 2018-07-23 ENCOUNTER — Other Ambulatory Visit: Payer: Self-pay | Admitting: Family Medicine

## 2018-07-23 DIAGNOSIS — F339 Major depressive disorder, recurrent, unspecified: Secondary | ICD-10-CM

## 2018-07-27 ENCOUNTER — Other Ambulatory Visit (INDEPENDENT_AMBULATORY_CARE_PROVIDER_SITE_OTHER): Payer: Self-pay | Admitting: Physician Assistant

## 2018-07-27 DIAGNOSIS — E119 Type 2 diabetes mellitus without complications: Secondary | ICD-10-CM

## 2018-07-27 DIAGNOSIS — F339 Major depressive disorder, recurrent, unspecified: Secondary | ICD-10-CM

## 2018-08-02 ENCOUNTER — Other Ambulatory Visit: Payer: Self-pay

## 2018-08-02 ENCOUNTER — Ambulatory Visit (INDEPENDENT_AMBULATORY_CARE_PROVIDER_SITE_OTHER): Payer: Medicare HMO | Admitting: Physician Assistant

## 2018-08-02 ENCOUNTER — Encounter (INDEPENDENT_AMBULATORY_CARE_PROVIDER_SITE_OTHER): Payer: Self-pay | Admitting: Physician Assistant

## 2018-08-02 ENCOUNTER — Encounter: Payer: Self-pay | Admitting: Family Medicine

## 2018-08-02 DIAGNOSIS — E7849 Other hyperlipidemia: Secondary | ICD-10-CM

## 2018-08-02 DIAGNOSIS — E669 Obesity, unspecified: Secondary | ICD-10-CM

## 2018-08-02 DIAGNOSIS — Z6830 Body mass index (BMI) 30.0-30.9, adult: Secondary | ICD-10-CM

## 2018-08-02 NOTE — Progress Notes (Signed)
Office: [phone]  /  Fax: [phone] TeleHealth Visit:  Cindy [name] has verbally consented to this TeleHealth visit today. The patient is located at home, the provider is located at the [facility] office. The participants in this visit include the listed provider and patient. The visit was conducted today via Webex.  HPI:   Chief Complaint: OBESITY Cindy [name] is here to discuss her progress with her obesity treatment plan. She is keeping a food journal with 1200 calories and 80 grams of protein and on the Pescatarian plan and is following her eating plan approximately 95% of the time. She states she is walking 40 minutes 6 times per week. Victory reports that she is back to journaling and is feeling better overall. She is working out almost daily. We were unable to weigh the patient today for this TeleHealth visit. She feels as if she has lost 2-4 lbs since her last visit. She has lost 16 lbs since starting treatment with [name].  Hyperlipidemia Cindy [name] has hyperlipidemia and has been trying to improve her cholesterol levels with intensive lifestyle modification including a low saturated fat diet, exercise and weight loss. She is on pravastatin and denies any chest pain.  ASSESSMENT AND PLAN:  Other hyperlipidemia  Class 1 obesity with serious comorbidity and body mass index (BMI) of 30.0 to 30.9 in adult, unspecified obesity type - Starting BMI greater then 30  PLAN:  Hyperlipidemia Adore was informed of the American Heart Association Guidelines emphasizing intensive lifestyle modifications as the first line treatment for hyperlipidemia. We discussed many lifestyle modifications today in depth, and Cindy [name] will continue to work on decreasing saturated fats such as fatty red meat, butter and many fried foods. She will continue with her meal plan and weight loss. She will also increase vegetables and lean protein in her diet and continue to work on exercise and weight loss  efforts.  Obesity Cindy [name] is currently in the action stage of change. As such, her goal is to continue with weight loss efforts. She has agreed to keep a food journal with 1200 calories and 80 grams of protein. Cindy [name] has been instructed to work up to a goal of 150 minutes of combined cardio and strengthening exercise per week for weight loss and overall health benefits. She will add resistance training. We discussed the following Behavioral Modification Strategies today: work on meal planning, easy cooking plans, and keeping healthy foods in the home.  Cindy [name] has agreed to follow-up with our clinic in 3 weeks. She was informed of the importance of frequent follow-up visits to maximize her success with intensive lifestyle modifications for her multiple health conditions.  ALLERGIES: Allergies  Allergen Reactions  . Codeine Other (See Comments)    hallucinations  . Crestor [Rosuvastatin]     REACTION: myalgias  . Other Other (See Comments)    NOVACAINE- blisters left side of mouth after receiving   . Amoxicillin Rash  . Erythromycin Other (See Comments)    cramps  . Sulfa Antibiotics Rash  . Sulfonamide Derivatives Rash    MEDICATIONS: Current Outpatient Medications on File Prior to Visit  Medication Sig Dispense Refill  . aspirin 81 MG tablet Take 81 mg by mouth daily.    . cholecalciferol (VITAMIN D) 1000 units tablet Take 2,000 Units by mouth daily.     . cycloSPORINE (RESTASIS) 0.05 % ophthalmic emulsion Place 1 drop into both eyes daily as needed. Reported on 07/09/2015    . DULoxetine (CYMBALTA) 30 MG capsule TAKE  ONE CAPSULE BY MOUTH EVERY OTHER DAY 30 capsule 0  . ezetimibe (ZETIA) 10 MG tablet Take 1 tablet (10 mg total) by mouth daily. 30 tablet 0  . fluticasone (FLONASE) 50 MCG/ACT nasal spray Place 1 spray into both nostrils as needed.     . fluticasone (FLONASE) 50 MCG/ACT nasal spray Place 2 sprays into both nostrils daily for 10 days. 16 g 0  . levocetirizine (XYZAL) 5  MG tablet Take 5 mg by mouth as needed.   3  . metFORMIN (GLUCOPHAGE) 500 MG tablet TAKE ONE TABLET BY MOUTH TWICE A DAY WITH MEALS 60 tablet 0  . pravastatin (PRAVACHOL) 80 MG tablet Take 1 tablet (80 mg total) by mouth daily. 90 tablet 1  . Turmeric 500 MG CAPS Take by mouth.     No current facility-administered medications on file prior to visit.     PAST MEDICAL HISTORY: Past Medical History:  Diagnosis Date  . Abnormal liver function tests 07/19/2015  . Allergic state 02/01/2010   Qualifier: Diagnosis of  By: Regis Bill MD, Standley Brooking   . Allergy   . Anxiety   . Back pain   . Chicken pox 73 yrs old  . Depressive disorder, not elsewhere classified   . Diabetes ([place])    diet controlled- no medicines   . Fibroid   . History of fractured kneecap    right   . Hyperglycemia   . Hyperlipidemia   . Infertility, female   . Joint pain   . Knee pain, left 10/05/2011   Follows with Dr Margarette [place]  . Measles as a child  . Medicare annual wellness visit, subsequent 07/06/2014  . OA (osteoarthritis) of knee    left knee  . Obesity 10/05/2011  . Obesity 10/05/2011  . Osteoarthritis   . Osteopenia 06/26/2014  . Other and unspecified hyperlipidemia   . Overweight 10/05/2011  . Overweight(278.02) 10/05/2011  . Sleep apnea     On CPAP  . Sleep apnea   . Stye 11/19/2012  . Syncope 01/26/2017  . Thyroid disease    h/o hyperthyroid treated with radioactive iodine? from 16 to 20  . Type 2 diabetes mellitus with hyperlipidemia ([place])   . Varicose vein of leg     PAST SURGICAL HISTORY: Past Surgical History:  Procedure Laterality Date  . BACK SURGERY     L5 discectomy, 2002. helpful  . CERVICAL BIOPSY  W/ LOOP ELECTRODE EXCISION    . CESAREAN SECTION  1987  . COLONOSCOPY    . CPAP    . KNEE SURGERY Right 2006   R knee fx. patella  . LEEP    . stitches in foot Right     SOCIAL HISTORY: Social History   Tobacco Use  . Smoking status: Never Smoker  . Smokeless tobacco: Never Used  Substance Use  Topics  . Alcohol use: Yes    Alcohol/week: 1.0 standard drinks    Types: 1 Glasses of wine per week  . Drug use: No    FAMILY HISTORY: Family History  Problem Relation Age of Onset  . Hyperlipidemia Mother   . Dementia Mother   . Obesity Mother   . Hyperlipidemia Father   . Heart disease Father        bypass, CAD  . Sleep apnea Father   . Hyperlipidemia Sister   . Diabetes Sister        type 2  . Colon polyps Sister   . Dementia Maternal Grandmother  alzheimer  . Alcohol abuse Maternal Grandfather   . Diabetes Paternal Grandmother        type 2?  . Colon cancer Neg Hx   . Esophageal cancer Neg Hx   . Rectal cancer Neg Hx   . Stomach cancer Neg Hx    ROS: Review of Systems  Cardiovascular: Negative for chest pain.   PHYSICAL EXAM: Pt in no acute distress  RECENT LABS AND TESTS: BMET    Component Value Date/Time   NA 142 04/10/2018 0820   K 4.4 04/10/2018 0820   CL 102 04/10/2018 0820   CO2 23 04/10/2018 0820   GLUCOSE 95 04/10/2018 0820   GLUCOSE 81 07/31/2017 1411   BUN 20 04/10/2018 0820   CREATININE 0.71 04/10/2018 0820   CALCIUM 10.0 04/10/2018 0820   GFRNONAA 87 04/10/2018 0820   GFRAA 100 04/10/2018 0820   Lab Results  Component Value Date   HGBA1C 5.8 (H) 04/10/2018   HGBA1C 5.8 (H) 11/30/2017   HGBA1C 5.7 (H) 07/20/2017   HGBA1C 5.8 (H) 01/10/2017   HGBA1C 6.1 08/05/2016   Lab Results  Component Value Date   INSULIN 6.1 04/10/2018   INSULIN 9.6 11/30/2017   INSULIN 9.7 07/20/2017   INSULIN 11.7 01/10/2017   CBC    Component Value Date/Time   WBC 6.2 01/10/2017 0938   WBC 7.4 08/05/2016 0956   RBC 4.51 01/10/2017 0938   RBC 4.56 08/05/2016 0956   HGB 13.2 01/10/2017 0938   HCT 41.3 01/10/2017 0938   PLT 308.0 08/05/2016 0956   MCV 92 01/10/2017 0938   MCH 29.3 01/10/2017 0938   MCHC 32.0 01/10/2017 0938   MCHC 33.6 08/05/2016 0956   RDW 14.0 01/10/2017 0938   LYMPHSABS 2.1 01/10/2017 0938   MONOABS 0.7 08/05/2016 0956    EOSABS 0.2 01/10/2017 0938   BASOSABS 0.1 01/10/2017 0938   Iron/TIBC/Ferritin/ %Sat No results found for: IRON, TIBC, FERRITIN, IRONPCTSAT Lipid Panel     Component Value Date/Time   CHOL 273 (H) 04/10/2018 0820   TRIG 106 04/10/2018 0820   TRIG 94 04/07/2006 1019   HDL 59 04/10/2018 0820   CHOLHDL 4.1 07/20/2017 1229   CHOLHDL 4 08/05/2016 0956   VLDL 20.0 08/05/2016 0956   LDLCALC 193 (H) 04/10/2018 0820   LDLDIRECT 145.4 11/14/2012 0823   Hepatic Function Panel     Component Value Date/Time   PROT 6.6 04/10/2018 0820   ALBUMIN 4.2 04/10/2018 0820   AST 19 04/10/2018 0820   ALT 22 04/10/2018 0820   ALKPHOS 87 04/10/2018 0820   BILITOT 0.4 04/10/2018 0820   BILIDIR 0.1 10/29/2013 0831      Component Value Date/Time   TSH 1.020 01/10/2017 0938   TSH 1.21 08/05/2016 0956   TSH 1.56 07/06/2015 0936   Results for BROOKELLE, PELLICANE (MRN [id number]) as of 08/02/2018 14:53  Ref. Range 04/10/2018 08:20  Vitamin D, 25-Hydroxy Latest Ref Range: 30.0 - 100.0 ng/mL 57.2   I, Michaelene Song, am acting as Location manager for [employer], PA-C I, Abby Potash, PA-C have reviewed above note and agree with its content

## 2018-08-04 ENCOUNTER — Other Ambulatory Visit (INDEPENDENT_AMBULATORY_CARE_PROVIDER_SITE_OTHER): Payer: Self-pay | Admitting: Physician Assistant

## 2018-08-04 DIAGNOSIS — E7849 Other hyperlipidemia: Secondary | ICD-10-CM

## 2018-08-14 ENCOUNTER — Other Ambulatory Visit (INDEPENDENT_AMBULATORY_CARE_PROVIDER_SITE_OTHER): Payer: Self-pay | Admitting: Physician Assistant

## 2018-08-14 DIAGNOSIS — E119 Type 2 diabetes mellitus without complications: Secondary | ICD-10-CM

## 2018-08-20 ENCOUNTER — Other Ambulatory Visit: Payer: Self-pay

## 2018-08-20 ENCOUNTER — Encounter (INDEPENDENT_AMBULATORY_CARE_PROVIDER_SITE_OTHER): Payer: Self-pay | Admitting: Physician Assistant

## 2018-08-20 ENCOUNTER — Ambulatory Visit (INDEPENDENT_AMBULATORY_CARE_PROVIDER_SITE_OTHER): Payer: Medicare HMO | Admitting: Physician Assistant

## 2018-08-20 DIAGNOSIS — E669 Obesity, unspecified: Secondary | ICD-10-CM | POA: Diagnosis not present

## 2018-08-20 DIAGNOSIS — Z6830 Body mass index (BMI) 30.0-30.9, adult: Secondary | ICD-10-CM

## 2018-08-20 DIAGNOSIS — E119 Type 2 diabetes mellitus without complications: Secondary | ICD-10-CM | POA: Diagnosis not present

## 2018-08-20 DIAGNOSIS — E7849 Other hyperlipidemia: Secondary | ICD-10-CM | POA: Diagnosis not present

## 2018-08-20 MED ORDER — EZETIMIBE 10 MG PO TABS: 10.0000 mg | ORAL_TABLET | Freq: Every day | ORAL | 0 refills | Status: DC

## 2018-08-20 MED ORDER — METFORMIN HCL 500 MG PO TABS: ORAL_TABLET | ORAL | 0 refills | Status: DC

## 2018-08-21 NOTE — Progress Notes (Signed)
Office: [phone]  /  Fax: [phone] TeleHealth Visit:  Cindy [name] has verbally consented to this TeleHealth visit today. The patient is located at home, the provider is located at the [facility] office. The participants in this visit include the listed provider and patient. The visit was conducted today via Webex.  HPI:   Chief Complaint: OBESITY Cindy [name] is here to discuss her progress with her obesity treatment plan. She is keeping a food journal with 1200 calories and 80 grams of protein and is following her eating plan approximately 75% of the time. She states she is walking 8000 steps daily. Cindy [name] reports her weight today is 167 lbs. She is bored with the pescatarian plan currently. She is overeating her calories most days. We were unable to weigh the patient today for this TeleHealth visit. She states her weight today is 167 lbs. She has lost 16 lbs since starting treatment with [name].  Diabetes II with Hyperlipidemia Cindy [name] has a diagnosis of diabetes type II and is on metformin. Cindy [name] does not report checking her blood sugars. Last A1c was 5.8 on 04/10/2018. She has been working on intensive lifestyle modifications including diet, exercise, and weight loss to help control her blood glucose levels. No nausea, vomiting, or diarrhea.  Hyperlipidemia Cindy [name] has hyperlipidemia and has been trying to improve her cholesterol levels with intensive lifestyle modification including a low saturated fat diet, exercise and weight loss. She is on Zetia and denies any chest pain.  ASSESSMENT AND PLAN:  Type 2 diabetes mellitus without complication, without long-term current use of insulin ([place]) - Plan: metFORMIN (GLUCOPHAGE) 500 MG tablet  Other hyperlipidemia - Plan: ezetimibe (ZETIA) 10 MG tablet  Class 1 obesity with serious comorbidity and body mass index (BMI) of 30.0 to 30.9 in adult, unspecified obesity type - Starting BMI greater then 30  PLAN:  Diabetes II with  Hyperlipidemia Cindy [name] has been given extensive diabetes education by myself today including ideal fasting and post-prandial blood glucose readings, individual ideal HgA1c goals  and hypoglycemia prevention. We discussed the importance of good blood sugar control to decrease the likelihood of diabetic complications such as nephropathy, neuropathy, limb loss, blindness, coronary artery disease, and death. We discussed the importance of intensive lifestyle modification including diet, exercise and weight loss as the first line treatment for diabetes. Novella was given a refill on her metformin #180 with 0 refills and agrees to follow-up with our clinic in 2 weeks.  Hyperlipidemia Cindy [name] was informed of the American Heart Association Guidelines emphasizing intensive lifestyle modifications as the first line treatment for hyperlipidemia. We discussed many lifestyle modifications today in depth, and Cindy [name] will continue to work on decreasing saturated fats such as fatty red meat, butter and many fried foods. Cindy [name] was given a refill on her Zetia #90 with 0 refills and agrees to follow-up with our clinic in 2 weeks. She will also increase vegetables and lean protein in her diet and continue to work on exercise and weight loss efforts.  Obesity Cindy [name] is currently in the action stage of change. As such, her goal is to continue with weight loss efforts. She has agreed to keep a food journal with 1200 calories and 80 grams of protein daily or follow the Pescatarian plan. Cindy [name] has been instructed to work up to a goal of 150 minutes of combined cardio and strengthening exercise per week for weight loss and overall health benefits. We discussed the following Behavioral Modification Strategies today: increasing lean protein intake,  work on meal planning and easy cooking plans.  Cindy [name] has agreed to follow-up with our clinic in 2 weeks. She was informed of the importance of frequent follow-up visits to maximize her success  with intensive lifestyle modifications for her multiple health conditions.  ALLERGIES: Allergies  Allergen Reactions  . Codeine Other (See Comments)    hallucinations  . Crestor [Rosuvastatin]     REACTION: myalgias  . Other Other (See Comments)    NOVACAINE- blisters left side of mouth after receiving   . Amoxicillin Rash  . Erythromycin Other (See Comments)    cramps  . Sulfa Antibiotics Rash  . Sulfonamide Derivatives Rash    MEDICATIONS: Current Outpatient Medications on File Prior to Visit  Medication Sig Dispense Refill  . aspirin 81 MG tablet Take 81 mg by mouth daily.    . cholecalciferol (VITAMIN D) 1000 units tablet Take 2,000 Units by mouth daily.     . cycloSPORINE (RESTASIS) 0.05 % ophthalmic emulsion Place 1 drop into both eyes daily as needed. Reported on 07/09/2015    . DULoxetine (CYMBALTA) 30 MG capsule TAKE ONE CAPSULE BY MOUTH EVERY OTHER DAY 30 capsule 0  . fluticasone (FLONASE) 50 MCG/ACT nasal spray Place 1 spray into both nostrils as needed.     . fluticasone (FLONASE) 50 MCG/ACT nasal spray Place 2 sprays into both nostrils daily for 10 days. 16 g 0  . levocetirizine (XYZAL) 5 MG tablet Take 5 mg by mouth as needed.   3  . pravastatin (PRAVACHOL) 80 MG tablet Take 1 tablet (80 mg total) by mouth daily. 90 tablet 1  . Turmeric 500 MG CAPS Take by mouth.     No current facility-administered medications on file prior to visit.     PAST MEDICAL HISTORY: Past Medical History:  Diagnosis Date  . Abnormal liver function tests 07/19/2015  . Allergic state 02/01/2010   Qualifier: Diagnosis of  By: Regis Bill MD, Standley Brooking   . Allergy   . Anxiety   . Back pain   . Chicken pox 73 yrs old  . Depressive disorder, not elsewhere classified   . Diabetes ([place])    diet controlled- no medicines   . Fibroid   . History of fractured kneecap    right   . Hyperglycemia   . Hyperlipidemia   . Infertility, female   . Joint pain   . Knee pain, left 10/05/2011   Follows  with Dr Margarette [place]  . Measles as a child  . Medicare annual wellness visit, subsequent 07/06/2014  . OA (osteoarthritis) of knee    left knee  . Obesity 10/05/2011  . Obesity 10/05/2011  . Osteoarthritis   . Osteopenia 06/26/2014  . Other and unspecified hyperlipidemia   . Overweight 10/05/2011  . Overweight(278.02) 10/05/2011  . Sleep apnea     On CPAP  . Sleep apnea   . Stye 11/19/2012  . Syncope 01/26/2017  . Thyroid disease    h/o hyperthyroid treated with radioactive iodine? from 16 to 20  . Type 2 diabetes mellitus with hyperlipidemia ([place])   . Varicose vein of leg     PAST SURGICAL HISTORY: Past Surgical History:  Procedure Laterality Date  . BACK SURGERY     L5 discectomy, 2002. helpful  . CERVICAL BIOPSY  W/ LOOP ELECTRODE EXCISION    . CESAREAN SECTION  1987  . COLONOSCOPY    . CPAP    . KNEE SURGERY Right 2006   R knee fx. patella  .  LEEP    . stitches in foot Right     SOCIAL HISTORY: Social History   Tobacco Use  . Smoking status: Never Smoker  . Smokeless tobacco: Never Used  Substance Use Topics  . Alcohol use: Yes    Alcohol/week: 1.0 standard drinks    Types: 1 Glasses of wine per week  . Drug use: No    FAMILY HISTORY: Family History  Problem Relation Age of Onset  . Hyperlipidemia Mother   . Dementia Mother   . Obesity Mother   . Hyperlipidemia Father   . Heart disease Father        bypass, CAD  . Sleep apnea Father   . Hyperlipidemia Sister   . Diabetes Sister        type 2  . Colon polyps Sister   . Dementia Maternal Grandmother        alzheimer  . Alcohol abuse Maternal Grandfather   . Diabetes Paternal Grandmother        type 2?  . Colon cancer Neg Hx   . Esophageal cancer Neg Hx   . Rectal cancer Neg Hx   . Stomach cancer Neg Hx    ROS: Review of Systems  Cardiovascular: Negative for chest pain.  Gastrointestinal: Negative for diarrhea, nausea and vomiting.   PHYSICAL EXAM: Pt in no acute distress  RECENT LABS AND TESTS:  BMET    Component Value Date/Time   NA 142 04/10/2018 0820   K 4.4 04/10/2018 0820   CL 102 04/10/2018 0820   CO2 23 04/10/2018 0820   GLUCOSE 95 04/10/2018 0820   GLUCOSE 81 07/31/2017 1411   BUN 20 04/10/2018 0820   CREATININE 0.71 04/10/2018 0820   CALCIUM 10.0 04/10/2018 0820   GFRNONAA 87 04/10/2018 0820   GFRAA 100 04/10/2018 0820   Lab Results  Component Value Date   HGBA1C 5.8 (H) 04/10/2018   HGBA1C 5.8 (H) 11/30/2017   HGBA1C 5.7 (H) 07/20/2017   HGBA1C 5.8 (H) 01/10/2017   HGBA1C 6.1 08/05/2016   Lab Results  Component Value Date   INSULIN 6.1 04/10/2018   INSULIN 9.6 11/30/2017   INSULIN 9.7 07/20/2017   INSULIN 11.7 01/10/2017   CBC    Component Value Date/Time   WBC 6.2 01/10/2017 0938   WBC 7.4 08/05/2016 0956   RBC 4.51 01/10/2017 0938   RBC 4.56 08/05/2016 0956   HGB 13.2 01/10/2017 0938   HCT 41.3 01/10/2017 0938   PLT 308.0 08/05/2016 0956   MCV 92 01/10/2017 0938   MCH 29.3 01/10/2017 0938   MCHC 32.0 01/10/2017 0938   MCHC 33.6 08/05/2016 0956   RDW 14.0 01/10/2017 0938   LYMPHSABS 2.1 01/10/2017 0938   MONOABS 0.7 08/05/2016 0956   EOSABS 0.2 01/10/2017 0938   BASOSABS 0.1 01/10/2017 0938   Iron/TIBC/Ferritin/ %Sat No results found for: IRON, TIBC, FERRITIN, IRONPCTSAT Lipid Panel     Component Value Date/Time   CHOL 273 (H) 04/10/2018 0820   TRIG 106 04/10/2018 0820   TRIG 94 04/07/2006 1019   HDL 59 04/10/2018 0820   CHOLHDL 4.1 07/20/2017 1229   CHOLHDL 4 08/05/2016 0956   VLDL 20.0 08/05/2016 0956   LDLCALC 193 (H) 04/10/2018 0820   LDLDIRECT 145.4 11/14/2012 0823   Hepatic Function Panel     Component Value Date/Time   PROT 6.6 04/10/2018 0820   ALBUMIN 4.2 04/10/2018 0820   AST 19 04/10/2018 0820   ALT 22 04/10/2018 0820   ALKPHOS 87 04/10/2018 0820  BILITOT 0.4 04/10/2018 0820   BILIDIR 0.1 10/29/2013 0831      Component Value Date/Time   TSH 1.020 01/10/2017 0938   TSH 1.21 08/05/2016 0956   TSH 1.56  07/06/2015 0936   Results for RUBYANN, LINGLE (MRN [id number]) as of 08/21/2018 08:31  Ref. Range 04/10/2018 08:20  Vitamin D, 25-Hydroxy Latest Ref Range: 30.0 - 100.0 ng/mL 57.2    I, Michaelene Song, am acting as Location manager for [employer], PA-C I, Abby Potash, PA-C have reviewed above note and agree with its content

## 2018-08-23 ENCOUNTER — Other Ambulatory Visit (INDEPENDENT_AMBULATORY_CARE_PROVIDER_SITE_OTHER): Payer: Self-pay | Admitting: Physician Assistant

## 2018-08-23 DIAGNOSIS — E7849 Other hyperlipidemia: Secondary | ICD-10-CM

## 2018-09-04 ENCOUNTER — Encounter (INDEPENDENT_AMBULATORY_CARE_PROVIDER_SITE_OTHER): Payer: Self-pay | Admitting: Physician Assistant

## 2018-09-04 ENCOUNTER — Ambulatory Visit (INDEPENDENT_AMBULATORY_CARE_PROVIDER_SITE_OTHER): Payer: Medicare HMO | Admitting: Physician Assistant

## 2018-09-04 ENCOUNTER — Other Ambulatory Visit: Payer: Self-pay

## 2018-09-04 DIAGNOSIS — Z6830 Body mass index (BMI) 30.0-30.9, adult: Secondary | ICD-10-CM

## 2018-09-04 DIAGNOSIS — E669 Obesity, unspecified: Secondary | ICD-10-CM

## 2018-09-04 DIAGNOSIS — E7849 Other hyperlipidemia: Secondary | ICD-10-CM | POA: Diagnosis not present

## 2018-09-04 NOTE — Progress Notes (Signed)
Office: [phone]  /  Fax: [phone] TeleHealth Visit:  Cindy [name] has verbally consented to this TeleHealth visit today. The patient is located at home, the provider is located at the [facility] office. The participants in this visit include the listed provider and patient. The visit was conducted today via Webex.  HPI:   Chief Complaint: OBESITY Cindy [name] is here to discuss her progress with her obesity treatment plan. She is keeping a food journal with 1200 calories and 80 grams of protein daily and is following her eating plan approximately 50% of the time. She states she is walking 4-5 miles 7 times per week. Cindy [name] reports that she has been taking food out of containers to store them due to [place] and, therefore, is unable to journal that information. She states her hunger is controlled. We were unable to weigh the patient today for this TeleHealth visit. She states she weighed 167 lbs 1 week ago. She has lost 16 lbs since starting treatment with [name].  Hyperlipidemia Cindy [name] has hyperlipidemia and has been trying to improve her cholesterol levels with intensive lifestyle modification including a low saturated fat diet, exercise and weight loss. She is on Zetia and pravastatin and denies any chest pain.   ASSESSMENT AND PLAN:  Other hyperlipidemia  Class 1 obesity with serious comorbidity and body mass index (BMI) of 30.0 to 30.9 in adult, unspecified obesity type - Starting BMI greater then 30  PLAN:  Hyperlipidemia Cindy [name] was informed of the American Heart Association Guidelines emphasizing intensive lifestyle modifications as the first line treatment for hyperlipidemia. We discussed many lifestyle modifications today in depth, and Cindy [name] will continue to work on decreasing saturated fats such as fatty red meat, butter and many fried foods. She will continue taking Zetia and pravastatin, increase vegetables and lean protein in her diet, and continue to work on  exercise and weight loss efforts.  Obesity Cindy [name] is currently in the action stage of change. As such, her goal is to continue with weight loss efforts. She has agreed to keep a food journal with 1200 calories and 80 grams of protein daily. Cindy [name] has been instructed to work up to a goal of 150 minutes of combined cardio and strengthening exercise per week for weight loss and overall health benefits. We discussed the following Behavioral Modification Strategies today: planning for success and keep a strict food journal.  Cindy [name] has agreed to follow-up with our clinic in 3 weeks. She was informed of the importance of frequent follow-up visits to maximize her success with intensive lifestyle modifications for her multiple health conditions.  ALLERGIES: Allergies  Allergen Reactions  . Codeine Other (See Comments)    hallucinations  . Crestor [Rosuvastatin]     REACTION: myalgias  . Other Other (See Comments)    NOVACAINE- blisters left side of mouth after receiving   . Amoxicillin Rash  . Erythromycin Other (See Comments)    cramps  . Sulfa Antibiotics Rash  . Sulfonamide Derivatives Rash    MEDICATIONS: Current Outpatient Medications on File Prior to Visit  Medication Sig Dispense Refill  . aspirin 81 MG tablet Take 81 mg by mouth daily.    . cholecalciferol (VITAMIN D) 1000 units tablet Take 2,000 Units by mouth daily.     . cycloSPORINE (RESTASIS) 0.05 % ophthalmic emulsion Place 1 drop into both eyes daily as needed. Reported on 07/09/2015    . DULoxetine (CYMBALTA) 30 MG capsule TAKE ONE CAPSULE BY MOUTH EVERY OTHER DAY 30  capsule 0  . ezetimibe (ZETIA) 10 MG tablet Take 1 tablet (10 mg total) by mouth daily. 90 tablet 0  . fluticasone (FLONASE) 50 MCG/ACT nasal spray Place 1 spray into both nostrils as needed.     . fluticasone (FLONASE) 50 MCG/ACT nasal spray Place 2 sprays into both nostrils daily for 10 days. 16 g 0  . levocetirizine (XYZAL) 5 MG tablet Take 5 mg by mouth as  needed.   3  . metFORMIN (GLUCOPHAGE) 500 MG tablet TAKE ONE TABLET BY MOUTH TWICE A DAY WITH MEALS 180 tablet 0  . pravastatin (PRAVACHOL) 80 MG tablet Take 1 tablet (80 mg total) by mouth daily. 90 tablet 1  . Turmeric 500 MG CAPS Take by mouth.     No current facility-administered medications on file prior to visit.     PAST MEDICAL HISTORY: Past Medical History:  Diagnosis Date  . Abnormal liver function tests 07/19/2015  . Allergic state 02/01/2010   Qualifier: Diagnosis of  By: Regis Bill MD, Standley Brooking   . Allergy   . Anxiety   . Back pain   . Chicken pox 73 yrs old  . Depressive disorder, not elsewhere classified   . Diabetes ([place])    diet controlled- no medicines   . Fibroid   . History of fractured kneecap    right   . Hyperglycemia   . Hyperlipidemia   . Infertility, female   . Joint pain   . Knee pain, left 10/05/2011   Follows with Dr Margarette Canada  . Measles as a child  . Medicare annual wellness visit, subsequent 07/06/2014  . OA (osteoarthritis) of knee    left knee  . Obesity 10/05/2011  . Obesity 10/05/2011  . Osteoarthritis   . Osteopenia 06/26/2014  . Other and unspecified hyperlipidemia   . Overweight 10/05/2011  . Overweight(278.02) 10/05/2011  . Sleep apnea     On CPAP  . Sleep apnea   . Stye 11/19/2012  . Syncope 01/26/2017  . Thyroid disease    h/o hyperthyroid treated with radioactive iodine? from 16 to 20  . Type 2 diabetes mellitus with hyperlipidemia ([place])   . Varicose vein of leg     PAST SURGICAL HISTORY: Past Surgical History:  Procedure Laterality Date  . BACK SURGERY     L5 discectomy, 2002. helpful  . CERVICAL BIOPSY  W/ LOOP ELECTRODE EXCISION    . CESAREAN SECTION  1987  . COLONOSCOPY    . CPAP    . KNEE SURGERY Right 2006   R knee fx. patella  . LEEP    . stitches in foot Right     SOCIAL HISTORY: Social History   Tobacco Use  . Smoking status: Never Smoker  . Smokeless tobacco: Never Used  Substance Use Topics  . Alcohol use: Yes     Alcohol/week: 1.0 standard drinks    Types: 1 Glasses of wine per week  . Drug use: No    FAMILY HISTORY: Family History  Problem Relation Age of Onset  . Hyperlipidemia Mother   . Dementia Mother   . Obesity Mother   . Hyperlipidemia Father   . Heart disease Father        bypass, CAD  . Sleep apnea Father   . Hyperlipidemia Sister   . Diabetes Sister        type 2  . Colon polyps Sister   . Dementia Maternal Grandmother        alzheimer  . Alcohol abuse  Maternal Grandfather   . Diabetes Paternal Grandmother        type 2?  . Colon cancer Neg Hx   . Esophageal cancer Neg Hx   . Rectal cancer Neg Hx   . Stomach cancer Neg Hx    ROS: Review of Systems  Cardiovascular: Negative for chest pain.   PHYSICAL EXAM: Pt in no acute distress  RECENT LABS AND TESTS: BMET    Component Value Date/Time   NA 142 04/10/2018 0820   K 4.4 04/10/2018 0820   CL 102 04/10/2018 0820   CO2 23 04/10/2018 0820   GLUCOSE 95 04/10/2018 0820   GLUCOSE 81 07/31/2017 1411   BUN 20 04/10/2018 0820   CREATININE 0.71 04/10/2018 0820   CALCIUM 10.0 04/10/2018 0820   GFRNONAA 87 04/10/2018 0820   GFRAA 100 04/10/2018 0820   Lab Results  Component Value Date   HGBA1C 5.8 (H) 04/10/2018   HGBA1C 5.8 (H) 11/30/2017   HGBA1C 5.7 (H) 07/20/2017   HGBA1C 5.8 (H) 01/10/2017   HGBA1C 6.1 08/05/2016   Lab Results  Component Value Date   INSULIN 6.1 04/10/2018   INSULIN 9.6 11/30/2017   INSULIN 9.7 07/20/2017   INSULIN 11.7 01/10/2017   CBC    Component Value Date/Time   WBC 6.2 01/10/2017 0938   WBC 7.4 08/05/2016 0956   RBC 4.51 01/10/2017 0938   RBC 4.56 08/05/2016 0956   HGB 13.2 01/10/2017 0938   HCT 41.3 01/10/2017 0938   PLT 308.0 08/05/2016 0956   MCV 92 01/10/2017 0938   MCH 29.3 01/10/2017 0938   MCHC 32.0 01/10/2017 0938   MCHC 33.6 08/05/2016 0956   RDW 14.0 01/10/2017 0938   LYMPHSABS 2.1 01/10/2017 0938   MONOABS 0.7 08/05/2016 0956   EOSABS 0.2 01/10/2017 0938    BASOSABS 0.1 01/10/2017 0938   Iron/TIBC/Ferritin/ %Sat No results found for: IRON, TIBC, FERRITIN, IRONPCTSAT Lipid Panel     Component Value Date/Time   CHOL 273 (H) 04/10/2018 0820   TRIG 106 04/10/2018 0820   TRIG 94 04/07/2006 1019   HDL 59 04/10/2018 0820   CHOLHDL 4.1 07/20/2017 1229   CHOLHDL 4 08/05/2016 0956   VLDL 20.0 08/05/2016 0956   LDLCALC 193 (H) 04/10/2018 0820   LDLDIRECT 145.4 11/14/2012 0823   Hepatic Function Panel     Component Value Date/Time   PROT 6.6 04/10/2018 0820   ALBUMIN 4.2 04/10/2018 0820   AST 19 04/10/2018 0820   ALT 22 04/10/2018 0820   ALKPHOS 87 04/10/2018 0820   BILITOT 0.4 04/10/2018 0820   BILIDIR 0.1 10/29/2013 0831      Component Value Date/Time   TSH 1.020 01/10/2017 0938   TSH 1.21 08/05/2016 0956   TSH 1.56 07/06/2015 0936   Results for LYRIQ, FINERTY (MRN [id number]) as of 09/04/2018 15:32  Ref. Range 04/10/2018 08:20  Vitamin D, 25-Hydroxy Latest Ref Range: 30.0 - 100.0 ng/mL 57.2    I, Michaelene Song, am acting as Location manager for [employer], PA-C I, Abby Potash, PA-C have reviewed above note and agree with its content

## 2018-09-17 ENCOUNTER — Ambulatory Visit: Payer: Medicare HMO | Admitting: Podiatry

## 2018-09-18 ENCOUNTER — Ambulatory Visit (INDEPENDENT_AMBULATORY_CARE_PROVIDER_SITE_OTHER): Payer: Medicare HMO | Admitting: Physician Assistant

## 2018-09-20 ENCOUNTER — Other Ambulatory Visit: Payer: Self-pay

## 2018-09-20 ENCOUNTER — Encounter: Payer: Self-pay | Admitting: Podiatry

## 2018-09-20 ENCOUNTER — Ambulatory Visit: Payer: Medicare HMO | Admitting: Podiatry

## 2018-09-20 DIAGNOSIS — Q828 Other specified congenital malformations of skin: Secondary | ICD-10-CM

## 2018-09-20 DIAGNOSIS — M779 Enthesopathy, unspecified: Secondary | ICD-10-CM | POA: Diagnosis not present

## 2018-09-24 ENCOUNTER — Encounter (INDEPENDENT_AMBULATORY_CARE_PROVIDER_SITE_OTHER): Payer: Self-pay | Admitting: Physician Assistant

## 2018-09-24 ENCOUNTER — Telehealth (INDEPENDENT_AMBULATORY_CARE_PROVIDER_SITE_OTHER): Payer: Medicare HMO | Admitting: Physician Assistant

## 2018-09-24 ENCOUNTER — Other Ambulatory Visit: Payer: Self-pay

## 2018-09-24 DIAGNOSIS — Z6830 Body mass index (BMI) 30.0-30.9, adult: Secondary | ICD-10-CM | POA: Diagnosis not present

## 2018-09-24 DIAGNOSIS — E119 Type 2 diabetes mellitus without complications: Secondary | ICD-10-CM

## 2018-09-24 DIAGNOSIS — E669 Obesity, unspecified: Secondary | ICD-10-CM | POA: Diagnosis not present

## 2018-09-26 NOTE — Progress Notes (Signed)
Office: [phone]  /  Fax: [phone] TeleHealth Visit:  Cindy [name] has verbally consented to this TeleHealth visit today. The patient is located in her car, the provider is located at the [facility] office. The participants in this visit include the listed provider and patient. The visit was conducted today via telephone call.  HPI:   Chief Complaint: OBESITY Cindy [name] is here to discuss her progress with her obesity treatment plan. She is keeping a food journal with 1200 calories and 80 grams of protein and is following her eating plan approximately 70% of the time. She states she is walking 8,000 steps per day 3-7 times per week depending on the weather. Cindy [name] states her most recent weight is 167 lbs. She is frustrated with the lack of weight loss and wants to take a break from the plan and try "Isagenix."  We were unable to weigh the patient today for this TeleHealth visit. She feels as if she has maintained her weight since her last visit. She has lost 16 lbs since starting treatment with [name].  Diabetes II with Hyperlipidemia Cindy [name] has a diagnosis of diabetes type II and is on metformin. Cindy [name] is not checking her blood sugars. Last A1c was 5.8 on 04/10/2018. She has been working on intensive lifestyle modifications including diet, exercise, and weight loss to help control her blood glucose levels. No nausea, vomiting, or diarrhea.  ASSESSMENT AND PLAN:  No diagnosis found.  PLAN:  Diabetes II with Hyperlipidemia Laren has been given extensive diabetes education by myself today including ideal fasting and post-prandial blood glucose readings, individual ideal HgA1c goals  and hypoglycemia prevention. We discussed the importance of good blood sugar control to decrease the likelihood of diabetic complications such as nephropathy, neuropathy, limb loss, blindness, coronary artery disease, and death. We discussed the importance of intensive lifestyle modification  including diet, exercise and weight loss as the first line treatment for diabetes. Sydna agrees to continue her diabetes medications and will follow-up at the agreed upon time.  Obesity Cindy [name] is currently in the action stage of change. As such, her goal is to continue with weight loss efforts. She has agreed to keep a food journal with 1200 calories and 80 grams of protein daily. Cindy [name] has been instructed to work up to a goal of 150 minutes of combined cardio and strengthening exercise per week for weight loss and overall health benefits. We discussed the following Behavioral Modification Strategies today: work on meal planning and easy cooking plans.  Cindy [name] did not schedule a follow-up appointment with [name]. She was informed of the importance of frequent follow-up visits to maximize her success with intensive lifestyle modifications for her multiple health conditions.  ALLERGIES: Allergies  Allergen Reactions   Codeine Other (See Comments)    hallucinations   Crestor [Rosuvastatin]     REACTION: myalgias   Other Other (See Comments)    NOVACAINE- blisters left side of mouth after receiving    Amoxicillin Rash   Erythromycin Other (See Comments)    cramps   Sulfa Antibiotics Rash   Sulfonamide Derivatives Rash    MEDICATIONS: Current Outpatient Medications on File Prior to Visit  Medication Sig Dispense Refill   aspirin 81 MG tablet Take 81 mg by mouth daily.     cholecalciferol (VITAMIN D) 1000 units tablet Take 2,000 Units by mouth daily.      cycloSPORINE (RESTASIS) 0.05 % ophthalmic emulsion Place 1 drop into both eyes daily as needed. Reported on 07/09/2015  DULoxetine (CYMBALTA) 30 MG capsule TAKE ONE CAPSULE BY MOUTH EVERY OTHER DAY 30 capsule 0   ezetimibe (ZETIA) 10 MG tablet Take 1 tablet (10 mg total) by mouth daily. 90 tablet 0   fluticasone (FLONASE) 50 MCG/ACT nasal spray Place 1 spray into both nostrils as needed.      levocetirizine (XYZAL) 5 MG  tablet Take 5 mg by mouth as needed.   3   metFORMIN (GLUCOPHAGE) 500 MG tablet TAKE ONE TABLET BY MOUTH TWICE A DAY WITH MEALS 180 tablet 0   pravastatin (PRAVACHOL) 80 MG tablet Take 1 tablet (80 mg total) by mouth daily. 90 tablet 1   Turmeric 500 MG CAPS Take by mouth.     fluticasone (FLONASE) 50 MCG/ACT nasal spray Place 2 sprays into both nostrils daily for 10 days. 16 g 0   No current facility-administered medications on file prior to visit.     PAST MEDICAL HISTORY: Past Medical History:  Diagnosis Date   Abnormal liver function tests 07/19/2015   Allergic state 02/01/2010   Qualifier: Diagnosis of  By: Regis Bill MD, Standley Brooking    Allergy    Anxiety    Back pain    Chicken pox 73 yrs old   Depressive disorder, not elsewhere classified    Diabetes ([place])    diet controlled- no medicines    Fibroid    History of fractured kneecap    right    Hyperglycemia    Hyperlipidemia    Infertility, female    Joint pain    Knee pain, left 10/05/2011   Follows with Dr Margarette Canada   Measles as a child   Medicare annual wellness visit, subsequent 07/06/2014   OA (osteoarthritis) of knee    left knee   Obesity 10/05/2011   Obesity 10/05/2011   Osteoarthritis    Osteopenia 06/26/2014   Other and unspecified hyperlipidemia    Overweight 10/05/2011   Overweight(278.02) 10/05/2011   Sleep apnea     On CPAP   Sleep apnea    Stye 11/19/2012   Syncope 01/26/2017   Thyroid disease    h/o hyperthyroid treated with radioactive iodine? from 16 to 20   Type 2 diabetes mellitus with hyperlipidemia ([place])    Varicose vein of leg     PAST SURGICAL HISTORY: Past Surgical History:  Procedure Laterality Date   BACK SURGERY     L5 discectomy, 2002. helpful   CERVICAL BIOPSY  W/ LOOP ELECTRODE EXCISION     CESAREAN SECTION  1987   COLONOSCOPY     CPAP     KNEE SURGERY Right 2006   R knee fx. patella   LEEP     stitches in foot Right     SOCIAL HISTORY: Social  History   Tobacco Use   Smoking status: Never Smoker   Smokeless tobacco: Never Used  Substance Use Topics   Alcohol use: Yes    Alcohol/week: 1.0 standard drinks    Types: 1 Glasses of wine per week   Drug use: No    FAMILY HISTORY: Family History  Problem Relation Age of Onset   Hyperlipidemia Mother    Dementia Mother    Obesity Mother    Hyperlipidemia Father    Heart disease Father        bypass, CAD   Sleep apnea Father    Hyperlipidemia Sister    Diabetes Sister        type 2   Colon polyps Sister    Dementia  Maternal Grandmother        alzheimer   Alcohol abuse Maternal Grandfather    Diabetes Paternal Grandmother        type 2?   Colon cancer Neg Hx    Esophageal cancer Neg Hx    Rectal cancer Neg Hx    Stomach cancer Neg Hx    ROS: Review of Systems  Gastrointestinal: Negative for diarrhea, nausea and vomiting.   PHYSICAL EXAM: Pt in no acute distress  RECENT LABS AND TESTS: BMET    Component Value Date/Time   NA 142 04/10/2018 0820   K 4.4 04/10/2018 0820   CL 102 04/10/2018 0820   CO2 23 04/10/2018 0820   GLUCOSE 95 04/10/2018 0820   GLUCOSE 81 07/31/2017 1411   BUN 20 04/10/2018 0820   CREATININE 0.71 04/10/2018 0820   CALCIUM 10.0 04/10/2018 0820   GFRNONAA 87 04/10/2018 0820   GFRAA 100 04/10/2018 0820   Lab Results  Component Value Date   HGBA1C 5.8 (H) 04/10/2018   HGBA1C 5.8 (H) 11/30/2017   HGBA1C 5.7 (H) 07/20/2017   HGBA1C 5.8 (H) 01/10/2017   HGBA1C 6.1 08/05/2016   Lab Results  Component Value Date   INSULIN 6.1 04/10/2018   INSULIN 9.6 11/30/2017   INSULIN 9.7 07/20/2017   INSULIN 11.7 01/10/2017   CBC    Component Value Date/Time   WBC 6.2 01/10/2017 0938   WBC 7.4 08/05/2016 0956   RBC 4.51 01/10/2017 0938   RBC 4.56 08/05/2016 0956   HGB 13.2 01/10/2017 0938   HCT 41.3 01/10/2017 0938   PLT 308.0 08/05/2016 0956   MCV 92 01/10/2017 0938   MCH 29.3 01/10/2017 0938   MCHC 32.0 01/10/2017  0938   MCHC 33.6 08/05/2016 0956   RDW 14.0 01/10/2017 0938   LYMPHSABS 2.1 01/10/2017 0938   MONOABS 0.7 08/05/2016 0956   EOSABS 0.2 01/10/2017 0938   BASOSABS 0.1 01/10/2017 0938   Iron/TIBC/Ferritin/ %Sat No results found for: IRON, TIBC, FERRITIN, IRONPCTSAT Lipid Panel     Component Value Date/Time   CHOL 273 (H) 04/10/2018 0820   TRIG 106 04/10/2018 0820   TRIG 94 04/07/2006 1019   HDL 59 04/10/2018 0820   CHOLHDL 4.1 07/20/2017 1229   CHOLHDL 4 08/05/2016 0956   VLDL 20.0 08/05/2016 0956   LDLCALC 193 (H) 04/10/2018 0820   LDLDIRECT 145.4 11/14/2012 0823   Hepatic Function Panel     Component Value Date/Time   PROT 6.6 04/10/2018 0820   ALBUMIN 4.2 04/10/2018 0820   AST 19 04/10/2018 0820   ALT 22 04/10/2018 0820   ALKPHOS 87 04/10/2018 0820   BILITOT 0.4 04/10/2018 0820   BILIDIR 0.1 10/29/2013 0831      Component Value Date/Time   TSH 1.020 01/10/2017 0938   TSH 1.21 08/05/2016 0956   TSH 1.56 07/06/2015 0936   Results for CORETHA, CRESWELL (MRN [id number]) as of 09/26/2018 15:18  Ref. Range 04/10/2018 08:20  Vitamin D, 25-Hydroxy Latest Ref Range: 30.0 - 100.0 ng/mL 57.2    I, Michaelene Song, am acting as Location manager for [employer], PA-C I, Abby Potash, PA-C have reviewed above note and agree with its content

## 2018-09-27 NOTE — Progress Notes (Signed)
Subjective:   Patient ID: Cindy [name], female   DOB: 73 y.o.   MRN: [id number]   HPI Patient presents with quite a bit of inflammation around the fourth metatarsal phalangeal joint right and then a painful plantar keratotic lesion right that is difficult to walk on   ROS      Objective:  Physical Exam  Neurovascular status intact with inflammation pain to the fourth MPJ right with plantar keratotic lesion is very painful when palpated     Assessment:  Inflammatory capsulitis fourth MPJ right with keratotic lesion noted plantar     Plan:  Sterile prep and did a capsular injection fourth MPJ 2 mg Dexasone Kenalog 5 mg Xylocaine and deep debridement of lesion accomplished right with no iatrogenic bleeding and dressing applied

## 2018-10-03 ENCOUNTER — Other Ambulatory Visit: Payer: Self-pay | Admitting: Family Medicine

## 2018-10-03 ENCOUNTER — Other Ambulatory Visit (INDEPENDENT_AMBULATORY_CARE_PROVIDER_SITE_OTHER): Payer: Self-pay | Admitting: Physician Assistant

## 2018-10-03 DIAGNOSIS — E7849 Other hyperlipidemia: Secondary | ICD-10-CM

## 2018-10-03 DIAGNOSIS — F339 Major depressive disorder, recurrent, unspecified: Secondary | ICD-10-CM

## 2018-10-03 DIAGNOSIS — E119 Type 2 diabetes mellitus without complications: Secondary | ICD-10-CM

## 2018-10-13 ENCOUNTER — Other Ambulatory Visit (INDEPENDENT_AMBULATORY_CARE_PROVIDER_SITE_OTHER): Payer: Self-pay | Admitting: Physician Assistant

## 2018-10-13 DIAGNOSIS — E119 Type 2 diabetes mellitus without complications: Secondary | ICD-10-CM

## 2018-10-16 DIAGNOSIS — G4733 Obstructive sleep apnea (adult) (pediatric): Secondary | ICD-10-CM | POA: Diagnosis not present

## 2018-10-24 ENCOUNTER — Other Ambulatory Visit: Payer: Self-pay | Admitting: Family Medicine

## 2018-10-24 DIAGNOSIS — Z1231 Encounter for screening mammogram for malignant neoplasm of breast: Secondary | ICD-10-CM

## 2018-10-31 ENCOUNTER — Other Ambulatory Visit: Payer: Self-pay | Admitting: Family Medicine

## 2018-10-31 DIAGNOSIS — F339 Major depressive disorder, recurrent, unspecified: Secondary | ICD-10-CM

## 2018-11-26 ENCOUNTER — Other Ambulatory Visit (INDEPENDENT_AMBULATORY_CARE_PROVIDER_SITE_OTHER): Payer: Self-pay | Admitting: Physician Assistant

## 2018-11-26 ENCOUNTER — Other Ambulatory Visit: Payer: Self-pay | Admitting: Family Medicine

## 2018-11-26 DIAGNOSIS — E119 Type 2 diabetes mellitus without complications: Secondary | ICD-10-CM

## 2018-11-26 DIAGNOSIS — F339 Major depressive disorder, recurrent, unspecified: Secondary | ICD-10-CM

## 2018-11-26 DIAGNOSIS — E7849 Other hyperlipidemia: Secondary | ICD-10-CM

## 2018-12-07 ENCOUNTER — Ambulatory Visit
Admission: RE | Admit: 2018-12-07 | Discharge: 2018-12-07 | Disposition: A | Discharge status: Home / Self Care | Payer: Medicare HMO | Source: Ambulatory Visit | Attending: Family Medicine | Admitting: Family Medicine

## 2018-12-07 ENCOUNTER — Other Ambulatory Visit: Payer: Self-pay

## 2018-12-07 DIAGNOSIS — Z1231 Encounter for screening mammogram for malignant neoplasm of breast: Secondary | ICD-10-CM

## 2018-12-12 ENCOUNTER — Other Ambulatory Visit (INDEPENDENT_AMBULATORY_CARE_PROVIDER_SITE_OTHER): Payer: Self-pay | Admitting: Physician Assistant

## 2018-12-12 DIAGNOSIS — E7849 Other hyperlipidemia: Secondary | ICD-10-CM

## 2018-12-13 ENCOUNTER — Other Ambulatory Visit: Payer: Self-pay | Admitting: Family Medicine

## 2018-12-13 DIAGNOSIS — F339 Major depressive disorder, recurrent, unspecified: Secondary | ICD-10-CM

## 2018-12-17 DIAGNOSIS — H52223 Regular astigmatism, bilateral: Secondary | ICD-10-CM | POA: Diagnosis not present

## 2018-12-17 DIAGNOSIS — H5202 Hypermetropia, left eye: Secondary | ICD-10-CM | POA: Diagnosis not present

## 2018-12-17 DIAGNOSIS — E119 Type 2 diabetes mellitus without complications: Secondary | ICD-10-CM | POA: Diagnosis not present

## 2018-12-29 DIAGNOSIS — R69 Illness, unspecified: Secondary | ICD-10-CM | POA: Diagnosis not present

## 2019-01-01 ENCOUNTER — Other Ambulatory Visit: Payer: Self-pay | Admitting: Family Medicine

## 2019-01-03 ENCOUNTER — Ambulatory Visit: Payer: Medicare HMO

## 2019-01-06 ENCOUNTER — Other Ambulatory Visit (INDEPENDENT_AMBULATORY_CARE_PROVIDER_SITE_OTHER): Payer: Self-pay | Admitting: Physician Assistant

## 2019-01-06 DIAGNOSIS — E119 Type 2 diabetes mellitus without complications: Secondary | ICD-10-CM

## 2019-01-07 ENCOUNTER — Other Ambulatory Visit (INDEPENDENT_AMBULATORY_CARE_PROVIDER_SITE_OTHER): Payer: Self-pay | Admitting: Physician Assistant

## 2019-01-07 DIAGNOSIS — E119 Type 2 diabetes mellitus without complications: Secondary | ICD-10-CM

## 2019-01-09 ENCOUNTER — Other Ambulatory Visit (INDEPENDENT_AMBULATORY_CARE_PROVIDER_SITE_OTHER): Payer: Self-pay | Admitting: Physician Assistant

## 2019-01-09 DIAGNOSIS — E119 Type 2 diabetes mellitus without complications: Secondary | ICD-10-CM

## 2019-01-14 ENCOUNTER — Other Ambulatory Visit: Payer: Self-pay | Admitting: Family Medicine

## 2019-01-14 ENCOUNTER — Other Ambulatory Visit (INDEPENDENT_AMBULATORY_CARE_PROVIDER_SITE_OTHER): Payer: Self-pay | Admitting: Physician Assistant

## 2019-01-14 DIAGNOSIS — E119 Type 2 diabetes mellitus without complications: Secondary | ICD-10-CM

## 2019-01-14 DIAGNOSIS — F339 Major depressive disorder, recurrent, unspecified: Secondary | ICD-10-CM

## 2019-01-14 DIAGNOSIS — E7849 Other hyperlipidemia: Secondary | ICD-10-CM

## 2019-01-14 MED ORDER — DULOXETINE HCL 30 MG PO CPEP: 30.0000 mg | ORAL_CAPSULE | ORAL | 0 refills | Status: DC

## 2019-01-23 ENCOUNTER — Other Ambulatory Visit (INDEPENDENT_AMBULATORY_CARE_PROVIDER_SITE_OTHER): Payer: Self-pay | Admitting: Physician Assistant

## 2019-01-23 DIAGNOSIS — E119 Type 2 diabetes mellitus without complications: Secondary | ICD-10-CM

## 2019-01-28 ENCOUNTER — Telehealth: Payer: Self-pay | Admitting: *Deleted

## 2019-01-28 DIAGNOSIS — E119 Type 2 diabetes mellitus without complications: Secondary | ICD-10-CM

## 2019-01-28 NOTE — Telephone Encounter (Signed)
OK to send in a 30 day supply of the Metformin but then set her up with a virtual visit in next week or so as I have not seen her in a year and a 1/2 and I really do think she needs lab work so we can discuss at visit.

## 2019-01-28 NOTE — Telephone Encounter (Signed)
[place] faxed over a request for metformin.  It looks like [facility] was refilling, but has declined the refills due to patient not following up.  Last visit with them was in June.   She has an appointment scheduled with you on 05/07/19.  Will you refill for her?

## 2019-01-29 MED ORDER — METFORMIN HCL 500 MG PO TABS: ORAL_TABLET | ORAL | 0 refills | Status: DC

## 2019-01-29 NOTE — Telephone Encounter (Signed)
Pt is scheduled for virtual 11/3. Please send medication refill to  [facility] [address], [address] [phone] (Phone) [phone] (Fax)

## 2019-01-29 NOTE — Telephone Encounter (Signed)
Metformin sent in and patient notified

## 2019-02-05 ENCOUNTER — Ambulatory Visit (INDEPENDENT_AMBULATORY_CARE_PROVIDER_SITE_OTHER): Payer: Medicare HMO | Admitting: Family Medicine

## 2019-02-05 ENCOUNTER — Other Ambulatory Visit: Payer: Self-pay

## 2019-02-05 DIAGNOSIS — E7849 Other hyperlipidemia: Secondary | ICD-10-CM | POA: Diagnosis not present

## 2019-02-05 DIAGNOSIS — E785 Hyperlipidemia, unspecified: Secondary | ICD-10-CM | POA: Diagnosis not present

## 2019-02-05 DIAGNOSIS — E1169 Type 2 diabetes mellitus with other specified complication: Secondary | ICD-10-CM | POA: Diagnosis not present

## 2019-02-05 DIAGNOSIS — E6609 Other obesity due to excess calories: Secondary | ICD-10-CM | POA: Diagnosis not present

## 2019-02-05 DIAGNOSIS — E559 Vitamin D deficiency, unspecified: Secondary | ICD-10-CM

## 2019-02-05 MED ORDER — EZETIMIBE 10 MG PO TABS: 10.0000 mg | ORAL_TABLET | Freq: Every day | ORAL | 1 refills | Status: DC

## 2019-02-05 NOTE — Assessment & Plan Note (Signed)
Supplement and monitor 

## 2019-02-05 NOTE — Progress Notes (Signed)
Virtual Visit via phone Note  I connected with Cindy [name] on 02/05/19 at  2:40 PM EST by a phone enabled telemedicine application and verified that I am speaking with the correct person using two identifiers.  Location: Patient: home Provider: office   I discussed the limitations of evaluation and management by telemedicine and the availability of in person appointments. The patient expressed understanding and agreed to proceed. [name] Eulas Post CMA was able to get the patient set up on a phone visit after being unable to set up a video visit   Subjective:    Patient ID: Cindy [name], female    DOB: 1946-02-15, 73 y.o.   MRN: [id number]  No chief complaint on file.   HPI Patient is in today for follow up on chronic medical concerns including hyperlipidemia, hyperglycemia, obesity and more. No recent febrile illness or hospitalizations. She notes her insurance has refused to pay for her Zetia which she was tolerating. Denies CP/palp/SOB/HA/congestion/fevers/GI or GU c/o. Taking meds as prescribed. Is maintaining quarantine and working from home. She had initially gained weight with lock down but is now using Rickard Patience and that has helped her loose some COVID weight.   Past Medical History:  Diagnosis Date  . Abnormal liver function tests 07/19/2015  . Allergic state 02/01/2010   Qualifier: Diagnosis of  By: Regis Bill MD, Standley Brooking   . Allergy   . Anxiety   . Back pain   . Chicken pox 73 yrs old  . Depressive disorder, not elsewhere classified   . Diabetes ([place])    diet controlled- no medicines   . Fibroid   . History of fractured kneecap    right   . Hyperglycemia   . Hyperlipidemia   . Infertility, female   . Joint pain   . Knee pain, left 10/05/2011   Follows with Dr Margarette Canada  . Measles as a child  . Medicare annual wellness visit, subsequent 07/06/2014  . OA (osteoarthritis) of knee    left knee  . Obesity 10/05/2011  . Obesity 10/05/2011  . Osteoarthritis   . Osteopenia  06/26/2014  . Other and unspecified hyperlipidemia   . Overweight 10/05/2011  . Overweight(278.02) 10/05/2011  . Sleep apnea     On CPAP  . Sleep apnea   . Stye 11/19/2012  . Syncope 01/26/2017  . Thyroid disease    h/o hyperthyroid treated with radioactive iodine? from 16 to 20  . Type 2 diabetes mellitus with hyperlipidemia ([place])   . Varicose vein of leg     Past Surgical History:  Procedure Laterality Date  . BACK SURGERY     L5 discectomy, 2002. helpful  . CERVICAL BIOPSY  W/ LOOP ELECTRODE EXCISION    . CESAREAN SECTION  1987  . COLONOSCOPY    . CPAP    . KNEE SURGERY Right 2006   R knee fx. patella  . LEEP    . stitches in foot Right     Family History  Problem Relation Age of Onset  . Hyperlipidemia Mother   . Dementia Mother   . Obesity Mother   . Hyperlipidemia Father   . Heart disease Father        bypass, CAD  . Sleep apnea Father   . Hyperlipidemia Sister   . Diabetes Sister        type 2  . Colon polyps Sister   . Dementia Maternal Grandmother        alzheimer  . Alcohol abuse  Maternal Grandfather   . Diabetes Paternal Grandmother        type 2?  . Colon cancer Neg Hx   . Esophageal cancer Neg Hx   . Rectal cancer Neg Hx   . Stomach cancer Neg Hx     Social History   Socioeconomic History  . Marital status: Divorced    Spouse name: Not on file  . Number of children: 1  . Years of education: Not on file  . Highest education level: Not on file  Occupational History  . Occupation: licensed Development worker, international aid: [place]  . Financial resource strain: Not on file  . Food insecurity    Worry: Not on file    Inability: Not on file  . Transportation needs    Medical: Not on file    Non-medical: Not on file  Tobacco Use  . Smoking status: Never Smoker  . Smokeless tobacco: Never Used  Substance and Sexual Activity  . Alcohol use: Yes    Alcohol/week: 1.0 standard drinks    Types: 1 Glasses  of wine per week  . Drug use: No  . Sexual activity: Never    Partners: Male    Birth control/protection: Post-menopausal  Lifestyle  . Physical activity    Days per week: Not on file    Minutes per session: Not on file  . Stress: Not on file  Relationships  . Social Herbalist on phone: Not on file    Gets together: Not on file    Attends religious service: Not on file    Active member of club or organization: Not on file    Attends meetings of clubs or organizations: Not on file    Relationship status: Not on file  . Intimate partner violence    Fear of current or ex partner: Not on file    Emotionally abused: Not on file    Physically abused: Not on file    Forced sexual activity: Not on file  Other Topics Concern  . Not on file  Social History Narrative   Recently divorced separated fall 2012    Now living in [place] area .   Never smoker   Regular exercise   Sleep 01/12/29 up at 6          Outpatient Medications Prior to Visit  Medication Sig Dispense Refill  . aspirin 81 MG tablet Take 81 mg by mouth daily.    . cholecalciferol (VITAMIN D) 1000 units tablet Take 2,000 Units by mouth daily.     . cycloSPORINE (RESTASIS) 0.05 % ophthalmic emulsion Place 1 drop into both eyes daily as needed. Reported on 07/09/2015    . DULoxetine (CYMBALTA) 30 MG capsule Take 1 capsule (30 mg total) by mouth every other day. NEEDS OV FOR FURTHER REFILLS 16 capsule 0  . fluticasone (FLONASE) 50 MCG/ACT nasal spray Place 1 spray into both nostrils as needed.     Marland Kitchen levocetirizine (XYZAL) 5 MG tablet Take 5 mg by mouth as needed.   3  . metFORMIN (GLUCOPHAGE) 500 MG tablet TAKE ONE TABLET BY MOUTH TWICE A DAY WITH MEALS 60 tablet 0  . pravastatin (PRAVACHOL) 80 MG tablet TAKE ONE TABLET BY MOUTH DAILY 90 tablet 0  . Turmeric 500 MG CAPS Take by mouth.    . ezetimibe (ZETIA) 10 MG tablet Take 1 tablet (10 mg total) by mouth daily. 90 tablet 0  .  fluticasone (FLONASE) 50 MCG/ACT  nasal spray Place 2 sprays into both nostrils daily for 10 days. 16 g 0   No facility-administered medications prior to visit.     Allergies  Allergen Reactions  . Codeine Other (See Comments)    hallucinations  . Crestor [Rosuvastatin]     REACTION: myalgias  . Other Other (See Comments)    NOVACAINE- blisters left side of mouth after receiving   . Amoxicillin Rash  . Erythromycin Other (See Comments)    cramps  . Sulfa Antibiotics Rash  . Sulfonamide Derivatives Rash    Review of Systems  Constitutional: Negative for fever and malaise/fatigue.  HENT: Negative for congestion.   Eyes: Negative for blurred vision.  Respiratory: Negative for shortness of breath.   Cardiovascular: Negative for chest pain, palpitations and leg swelling.  Gastrointestinal: Negative for abdominal pain, blood in stool and nausea.  Genitourinary: Negative for dysuria and frequency.  Musculoskeletal: Negative for falls.  Skin: Negative for rash.  Neurological: Negative for dizziness, loss of consciousness and headaches.  Endo/Heme/Allergies: Negative for environmental allergies.  Psychiatric/Behavioral: Negative for depression. The patient is not nervous/anxious.        Objective:    Physical Exam unable to obtain via phone  LMP 04/04/2000  Wt Readings from Last 3 Encounters:  05/31/18 175 lb (79.4 kg)  05/22/18 169 lb (76.7 kg)  05/08/18 170 lb (77.1 kg)    Diabetic Foot Exam - Simple   No data filed     Lab Results  Component Value Date   WBC 6.2 01/10/2017   HGB 13.2 01/10/2017   HCT 41.3 01/10/2017   PLT 308.0 08/05/2016   GLUCOSE 95 04/10/2018   CHOL 273 (H) 04/10/2018   TRIG 106 04/10/2018   HDL 59 04/10/2018   LDLDIRECT 145.4 11/14/2012   LDLCALC 193 (H) 04/10/2018   ALT 22 04/10/2018   AST 19 04/10/2018   NA 142 04/10/2018   K 4.4 04/10/2018   CL 102 04/10/2018   CREATININE 0.71 04/10/2018   BUN 20 04/10/2018   CO2 23 04/10/2018   TSH 1.020 01/10/2017   HGBA1C  5.8 (H) 04/10/2018   MICROALBUR <0.7 01/09/2015    Lab Results  Component Value Date   TSH 1.020 01/10/2017   Lab Results  Component Value Date   WBC 6.2 01/10/2017   HGB 13.2 01/10/2017   HCT 41.3 01/10/2017   MCV 92 01/10/2017   PLT 308.0 08/05/2016   Lab Results  Component Value Date   NA 142 04/10/2018   K 4.4 04/10/2018   CO2 23 04/10/2018   GLUCOSE 95 04/10/2018   BUN 20 04/10/2018   CREATININE 0.71 04/10/2018   BILITOT 0.4 04/10/2018   ALKPHOS 87 04/10/2018   AST 19 04/10/2018   ALT 22 04/10/2018   PROT 6.6 04/10/2018   ALBUMIN 4.2 04/10/2018   CALCIUM 10.0 04/10/2018   GFR 92.47 07/31/2017   Lab Results  Component Value Date   CHOL 273 (H) 04/10/2018   Lab Results  Component Value Date   HDL 59 04/10/2018   Lab Results  Component Value Date   LDLCALC 193 (H) 04/10/2018   Lab Results  Component Value Date   TRIG 106 04/10/2018   Lab Results  Component Value Date   CHOLHDL 4.1 07/20/2017   Lab Results  Component Value Date   HGBA1C 5.8 (H) 04/10/2018       Assessment & Plan:   Problem List Items Addressed This Visit    Obesity  Stopped Healthy Weight and wellness and then gained COVID weight but is now doing Rickard Patience and she is loosing weight again. Encouraged heart healthy diet, decrease po intake and increase exercise as tolerated. Needs 7-8 hours of sleep nightly. Avoid trans fats, eat small, frequent meals every 4-5 hours with lean proteins, complex carbs and healthy fats. Minimize simple carbs      Type 2 diabetes mellitus with hyperlipidemia ([facility])    hgba1c acceptable, minimize simple carbs. Increase exercise as tolerated.      Relevant Medications   ezetimibe (ZETIA) 10 MG tablet   Vitamin D deficiency    Supplement and monitor      Other hyperlipidemia    Tolerating statin, encouraged heart healthy diet, avoid trans fats, minimize simple carbs and saturated fats. Increase exercise as tolerated. She has been tolerating  Zetia but her insurance is refusing to pay for it now. She will contact them and let us know what they said.       Relevant Medications   ezetimibe (ZETIA) 10 MG tablet      I am having Cindy [name] maintain her cycloSPORINE, levocetirizine, fluticasone, cholecalciferol, Turmeric, aspirin, fluticasone, pravastatin, DULoxetine, metFORMIN, and ezetimibe.  Meds ordered this encounter  Medications  . ezetimibe (ZETIA) 10 MG tablet    Sig: Take 1 tablet (10 mg total) by mouth daily.    Dispense:  90 tablet    Refill:  1     I discussed the assessment and treatment plan with the patient. The patient was provided an opportunity to ask questions and all were answered. The patient agreed with the plan and demonstrated an understanding of the instructions.   The patient was advised to call back or seek an in-person evaluation if the symptoms worsen or if the condition fails to improve as anticipated.  I provided 25 minutes of non-face-to-face time during this encounter.   Penni Homans, MD

## 2019-02-05 NOTE — Assessment & Plan Note (Signed)
Stopped Healthy Weight and wellness and then gained COVID weight but is now doing Rickard Patience and she is loosing weight again. Encouraged heart healthy diet, decrease po intake and increase exercise as tolerated. Needs 7-8 hours of sleep nightly. Avoid trans fats, eat small, frequent meals every 4-5 hours with lean proteins, complex carbs and healthy fats. Minimize simple carbs

## 2019-02-05 NOTE — Assessment & Plan Note (Signed)
Tolerating statin, encouraged heart healthy diet, avoid trans fats, minimize simple carbs and saturated fats. Increase exercise as tolerated. She has been tolerating Zetia but her insurance is refusing to pay for it now. She will contact them and let us know what they said.

## 2019-02-05 NOTE — Assessment & Plan Note (Signed)
hgba1c acceptable, minimize simple carbs. Increase exercise as tolerated.  

## 2019-02-12 ENCOUNTER — Telehealth: Payer: Self-pay

## 2019-02-12 NOTE — Telephone Encounter (Signed)
Copied from [place] [phone]. Topic: Appointment Scheduling - Scheduling Inquiry for Clinic >> Feb 12, 2019 11:27 AM Burchel, Abbi R wrote: Pt states she was to have some labs done following last OV.  Please review and call pt to sched.

## 2019-02-19 ENCOUNTER — Telehealth: Payer: Self-pay | Admitting: Family Medicine

## 2019-02-19 DIAGNOSIS — E785 Hyperlipidemia, unspecified: Secondary | ICD-10-CM

## 2019-02-19 DIAGNOSIS — R55 Syncope and collapse: Secondary | ICD-10-CM

## 2019-02-19 DIAGNOSIS — E559 Vitamin D deficiency, unspecified: Secondary | ICD-10-CM

## 2019-02-19 DIAGNOSIS — E7849 Other hyperlipidemia: Secondary | ICD-10-CM

## 2019-02-19 DIAGNOSIS — E1169 Type 2 diabetes mellitus with other specified complication: Secondary | ICD-10-CM

## 2019-02-19 NOTE — Telephone Encounter (Signed)
Orders placed  Please advise

## 2019-02-19 NOTE — Telephone Encounter (Signed)
Pt supposed to come in for labs this week or next but no lab orders are in. Please advise.

## 2019-02-22 ENCOUNTER — Other Ambulatory Visit: Payer: Self-pay

## 2019-02-22 ENCOUNTER — Other Ambulatory Visit (INDEPENDENT_AMBULATORY_CARE_PROVIDER_SITE_OTHER): Payer: Medicare HMO

## 2019-02-22 DIAGNOSIS — E559 Vitamin D deficiency, unspecified: Secondary | ICD-10-CM

## 2019-02-22 DIAGNOSIS — R55 Syncope and collapse: Secondary | ICD-10-CM

## 2019-02-22 DIAGNOSIS — E1169 Type 2 diabetes mellitus with other specified complication: Secondary | ICD-10-CM | POA: Diagnosis not present

## 2019-02-22 DIAGNOSIS — E785 Hyperlipidemia, unspecified: Secondary | ICD-10-CM

## 2019-02-22 LAB — HEMOGLOBIN A1C: Hgb A1c MFr Bld: 6 % (ref 4.6–6.5)

## 2019-02-22 LAB — CBC
HCT: 40.8 % (ref 36.0–46.0)
Hemoglobin: 13.7 g/dL (ref 12.0–15.0)
MCHC: 33.6 g/dL (ref 30.0–36.0)
MCV: 89.5 fl (ref 78.0–100.0)
Platelets: 292 10*3/uL (ref 150.0–400.0)
RBC: 4.56 Mil/uL (ref 3.87–5.11)
RDW: 14.2 % (ref 11.5–15.5)
WBC: 7.1 10*3/uL (ref 4.0–10.5)

## 2019-02-22 LAB — LIPID PANEL
Cholesterol: 166 mg/dL (ref 0–200)
HDL: 51.3 mg/dL (ref >39.00)
LDL Cholesterol: 93 mg/dL (ref 0–99)
NonHDL: 114.42
Total CHOL/HDL Ratio: 3
Triglycerides: 105 mg/dL (ref 0.0–149.0)
VLDL: 21 mg/dL (ref 0.0–40.0)

## 2019-02-22 LAB — COMPREHENSIVE METABOLIC PANEL
ALT: 16 U/L (ref 0–35)
AST: 15 U/L (ref 0–37)
Albumin: 4.2 g/dL (ref 3.5–5.2)
Alkaline Phosphatase: 78 U/L (ref 39–117)
BUN: 20 mg/dL (ref 6–23)
CO2: 31 mEq/L (ref 19–32)
Calcium: 10 mg/dL (ref 8.4–10.5)
Chloride: 103 mEq/L (ref 96–112)
Creatinine, Ser: 0.76 mg/dL (ref 0.40–1.20)
GFR: 74.89 mL/min (ref >60.00)
Glucose, Bld: 87 mg/dL (ref 70–99)
Potassium: 4.6 mEq/L (ref 3.5–5.1)
Sodium: 140 mEq/L (ref 135–145)
Total Bilirubin: 0.6 mg/dL (ref 0.2–1.2)
Total Protein: 6.7 g/dL (ref 6.0–8.3)

## 2019-02-22 LAB — VITAMIN D 25 HYDROXY (VIT D DEFICIENCY, FRACTURES): VITD: 88.98 ng/mL (ref 30.00–100.00)

## 2019-02-22 LAB — TSH: TSH: 1.04 u[IU]/mL (ref 0.35–4.50)

## 2019-03-03 ENCOUNTER — Other Ambulatory Visit: Payer: Self-pay | Admitting: Family Medicine

## 2019-03-03 DIAGNOSIS — E119 Type 2 diabetes mellitus without complications: Secondary | ICD-10-CM

## 2019-03-08 ENCOUNTER — Other Ambulatory Visit: Payer: Self-pay

## 2019-03-08 ENCOUNTER — Encounter (INDEPENDENT_AMBULATORY_CARE_PROVIDER_SITE_OTHER): Payer: Self-pay | Admitting: Otolaryngology

## 2019-03-08 ENCOUNTER — Ambulatory Visit (INDEPENDENT_AMBULATORY_CARE_PROVIDER_SITE_OTHER): Payer: Medicare HMO | Admitting: Otolaryngology

## 2019-03-08 VITALS — Temp 97.5°F

## 2019-03-08 DIAGNOSIS — J310 Chronic rhinitis: Secondary | ICD-10-CM

## 2019-03-08 DIAGNOSIS — J3481 Nasal mucositis (ulcerative): Secondary | ICD-10-CM

## 2019-03-08 DIAGNOSIS — J3489 Other specified disorders of nose and nasal sinuses: Secondary | ICD-10-CM | POA: Diagnosis not present

## 2019-03-08 NOTE — Progress Notes (Signed)
HPI: Cindy [name] is a 73 y.o. female who presents for evaluation of of a chronic sore in the left nostril she has had for 3 to 4 weeks.  She has history of obstructive sleep apnea initially starting CPAP 14 years ago.  She wears nasal cannula CPAP machine.  Over the past 4 weeks she has noticed a sore and crusting along the left medial nostril.  She has not used any medication on this.  Of note she has lost about 60 pounds over the past 10 years.  Past Medical History:  Diagnosis Date  . Abnormal liver function tests 07/19/2015  . Allergic state 02/01/2010   Qualifier: Diagnosis of  By: Regis Bill MD, Standley Brooking   . Allergy   . Anxiety   . Back pain   . Chicken pox 73 yrs old  . Depressive disorder, not elsewhere classified   . Diabetes ([place])    diet controlled- no medicines   . Fibroid   . History of fractured kneecap    right   . Hyperglycemia   . Hyperlipidemia   . Infertility, female   . Joint pain   . Knee pain, left 10/05/2011   Follows with Dr Margarette Canada  . Measles as a child  . Medicare annual wellness visit, subsequent 07/06/2014  . OA (osteoarthritis) of knee    left knee  . Obesity 10/05/2011  . Obesity 10/05/2011  . Osteoarthritis   . Osteopenia 06/26/2014  . Other and unspecified hyperlipidemia   . Overweight 10/05/2011  . Overweight(278.02) 10/05/2011  . Sleep apnea     On CPAP  . Sleep apnea   . Stye 11/19/2012  . Syncope 01/26/2017  . Thyroid disease    h/o hyperthyroid treated with radioactive iodine? from 16 to 20  . Type 2 diabetes mellitus with hyperlipidemia ([place])   . Varicose vein of leg    Past Surgical History:  Procedure Laterality Date  . BACK SURGERY     L5 discectomy, 2002. helpful  . CERVICAL BIOPSY  W/ LOOP ELECTRODE EXCISION    . CESAREAN SECTION  1987  . COLONOSCOPY    . CPAP    . KNEE SURGERY Right 2006   R knee fx. patella  . LEEP    . stitches in foot Right    Social History   Socioeconomic History  . Marital status: Divorced    Spouse name:  Not on file  . Number of children: 1  . Years of education: Not on file  . Highest education level: Not on file  Occupational History  . Occupation: licensed Development worker, international aid: [place]  . Financial resource strain: Not on file  . Food insecurity    Worry: Not on file    Inability: Not on file  . Transportation needs    Medical: Not on file    Non-medical: Not on file  Tobacco Use  . Smoking status: Never Smoker  . Smokeless tobacco: Never Used  Substance and Sexual Activity  . Alcohol use: Yes    Alcohol/week: 1.0 standard drinks    Types: 1 Glasses of wine per week  . Drug use: No  . Sexual activity: Never    Partners: Male    Birth control/protection: Post-menopausal  Lifestyle  . Physical activity    Days per week: Not on file    Minutes per session: Not on file  . Stress: Not on file  Relationships  .  Social Herbalist on phone: Not on file    Gets together: Not on file    Attends religious service: Not on file    Active member of club or organization: Not on file    Attends meetings of clubs or organizations: Not on file    Relationship status: Not on file  Other Topics Concern  . Not on file  Social History Narrative   Recently divorced separated fall 2012    Now living in [place] area .   Never smoker   Regular exercise   Sleep 01/12/29 up at 6         Family History  Problem Relation Age of Onset  . Hyperlipidemia Mother   . Dementia Mother   . Obesity Mother   . Hyperlipidemia Father   . Heart disease Father        bypass, CAD  . Sleep apnea Father   . Hyperlipidemia Sister   . Diabetes Sister        type 2  . Colon polyps Sister   . Dementia Maternal Grandmother        alzheimer  . Alcohol abuse Maternal Grandfather   . Diabetes Paternal Grandmother        type 2?  . Colon cancer Neg Hx   . Esophageal cancer Neg Hx   . Rectal cancer Neg Hx   . Stomach cancer Neg Hx     Allergies  Allergen Reactions  . Codeine Other (See Comments)    hallucinations  . Crestor [Rosuvastatin]     REACTION: myalgias  . Other Other (See Comments)    NOVACAINE- blisters left side of mouth after receiving   . Amoxicillin Rash  . Erythromycin Other (See Comments)    cramps  . Sulfa Antibiotics Rash  . Sulfonamide Derivatives Rash   Prior to Admission medications   Medication Sig Start Date End Date Taking? Authorizing Provider  aspirin 81 MG tablet Take 81 mg by mouth daily.   Yes [provider]  cholecalciferol (VITAMIN D) 1000 units tablet Take 2,000 Units by mouth daily.    Yes [provider]  cycloSPORINE (RESTASIS) 0.05 % ophthalmic emulsion Place 1 drop into both eyes daily as needed. Reported on 07/09/2015   Yes [provider]  DULoxetine (CYMBALTA) 30 MG capsule Take 1 capsule (30 mg total) by mouth every other day. NEEDS OV FOR FURTHER REFILLS 01/14/19  Yes Mosie Lukes, MD  ezetimibe (ZETIA) 10 MG tablet Take 1 tablet (10 mg total) by mouth daily. 02/05/19  Yes Mosie Lukes, MD  fluticasone (FLONASE) 50 MCG/ACT nasal spray Place 1 spray into both nostrils as needed.    Yes [provider]  levocetirizine (XYZAL) 5 MG tablet Take 5 mg by mouth as needed.  12/05/14  Yes [provider]  metFORMIN (GLUCOPHAGE) 500 MG tablet TAKE ONE TABLET BY MOUTH TWICE A DAY WITH MEALS 03/05/19  Yes Mosie Lukes, MD  pravastatin (PRAVACHOL) 80 MG tablet TAKE ONE TABLET BY MOUTH DAILY 01/02/19  Yes Mosie Lukes, MD  Turmeric 500 MG CAPS Take by mouth.   Yes [provider]  fluticasone (FLONASE) 50 MCG/ACT nasal spray Place 2 sprays into both nostrils daily for 10 days. 05/31/18 06/10/18  Kara Dies, NP     Positive ROS: Otherwise negative  All other systems have been reviewed and were otherwise negative with the exception of those mentioned in the HPI and as above.  Physical Exam: Constitutional: Alert,  well-appearing, no acute distress Ears: External ears without lesions or tenderness. Ear canals are clear bilaterally with intact, clear TMs.  Nasal: External nose without lesions. Septum relatively midline.. Clear nasal passages.  No polyps no signs of infection both middle meatus regions are clear.  She has little bit of white debris and scabbing and crusting along the left inferior septal side nostril just at the end of the cartilaginous septum.  This is soft to palpation with a Q-tip and appears to be more traumatic than neoplastic. Oral: Lips and gums without lesions. Tongue and palate mucosa without lesions. Posterior oropharynx clear. Neck: No palpable adenopathy or masses Respiratory: Breathing comfortably  Skin: No facial/neck lesions or rash noted.  Procedures  Assessment: Left nostril vestibulitis appears to be more traumatic than neoplastic.  Plan: Recommended application of mupirocin 2% ointment to the sore twice a day for the next week.  Recommended taking a break from use of the nasal CPAP cannula on the left side or to place a small spot Band-Aid or something over the sore area to protect it. She will follow-up if this is not better within 4 to 6 weeks to consider biopsy if indicated.  Radene Journey, MD

## 2019-03-13 DIAGNOSIS — D2261 Melanocytic nevi of right upper limb, including shoulder: Secondary | ICD-10-CM | POA: Diagnosis not present

## 2019-03-13 DIAGNOSIS — L821 Other seborrheic keratosis: Secondary | ICD-10-CM | POA: Diagnosis not present

## 2019-03-13 DIAGNOSIS — Z86018 Personal history of other benign neoplasm: Secondary | ICD-10-CM | POA: Diagnosis not present

## 2019-03-13 DIAGNOSIS — D485 Neoplasm of uncertain behavior of skin: Secondary | ICD-10-CM | POA: Diagnosis not present

## 2019-03-13 DIAGNOSIS — D225 Melanocytic nevi of trunk: Secondary | ICD-10-CM | POA: Diagnosis not present

## 2019-03-13 DIAGNOSIS — L820 Inflamed seborrheic keratosis: Secondary | ICD-10-CM | POA: Diagnosis not present

## 2019-03-13 DIAGNOSIS — D2272 Melanocytic nevi of left lower limb, including hip: Secondary | ICD-10-CM | POA: Diagnosis not present

## 2019-03-13 DIAGNOSIS — Q825 Congenital non-neoplastic nevus: Secondary | ICD-10-CM | POA: Diagnosis not present

## 2019-03-13 DIAGNOSIS — Z23 Encounter for immunization: Secondary | ICD-10-CM | POA: Diagnosis not present

## 2019-03-15 ENCOUNTER — Ambulatory Visit: Payer: Medicare HMO

## 2019-03-15 ENCOUNTER — Ambulatory Visit (INDEPENDENT_AMBULATORY_CARE_PROVIDER_SITE_OTHER): Payer: Medicare HMO | Admitting: Podiatry

## 2019-03-15 ENCOUNTER — Other Ambulatory Visit: Payer: Self-pay

## 2019-03-15 ENCOUNTER — Encounter: Payer: Self-pay | Admitting: Podiatry

## 2019-03-15 DIAGNOSIS — Q828 Other specified congenital malformations of skin: Secondary | ICD-10-CM | POA: Diagnosis not present

## 2019-03-15 DIAGNOSIS — M21621 Bunionette of right foot: Secondary | ICD-10-CM | POA: Diagnosis not present

## 2019-03-15 DIAGNOSIS — M79671 Pain in right foot: Secondary | ICD-10-CM

## 2019-03-18 NOTE — Progress Notes (Signed)
Subjective:   Patient ID: Cindy [name], female   DOB: 73 y.o.   MRN: [id number]   HPI Patient states she has had pain in the bottom of her right foot that occurs if she turns her foot wrong or bumps it and on the left she may have stepped on something and it is been painful she is try to get it out but has not been able to and is concerned about also having diabetes and its relationship to this condition   ROS      Objective:  Physical Exam  Neurovascular status intact with mild to moderate inflammation lateral side right foot with no specific area of pain with no indication of fracture or other pathology currently.  Plantar aspect left shows a small lesion subsecond metatarsal with discoloration keratotic tissue formation     Assessment:  Inflammatory condition right that is present lateral side of the foot with keratotic lesion subsecond metatarsal left painful     Plan:  H&P conditions x-rays reviewed and for the right we will do shoe gear modifications and I discussed inflammatory condition and for the left I went ahead did sterile debridement of the lesion advised this patient on supportive thick bottom shoes and reappoint for [name] to recheck  X-rays indicate that there is no indications of fracture or arthritic process bilateral or foreign body left

## 2019-04-04 ENCOUNTER — Other Ambulatory Visit: Payer: Self-pay | Admitting: Family Medicine

## 2019-04-04 DIAGNOSIS — F339 Major depressive disorder, recurrent, unspecified: Secondary | ICD-10-CM

## 2019-04-05 ENCOUNTER — Other Ambulatory Visit: Payer: Self-pay | Admitting: Family Medicine

## 2019-04-05 DIAGNOSIS — E119 Type 2 diabetes mellitus without complications: Secondary | ICD-10-CM

## 2019-04-09 ENCOUNTER — Other Ambulatory Visit: Payer: Self-pay | Admitting: Family Medicine

## 2019-04-11 ENCOUNTER — Ambulatory Visit (INDEPENDENT_AMBULATORY_CARE_PROVIDER_SITE_OTHER): Payer: Medicare Other | Admitting: Otolaryngology

## 2019-04-11 ENCOUNTER — Other Ambulatory Visit: Payer: Self-pay

## 2019-04-11 ENCOUNTER — Encounter (INDEPENDENT_AMBULATORY_CARE_PROVIDER_SITE_OTHER): Payer: Self-pay | Admitting: Otolaryngology

## 2019-04-11 VITALS — Temp 97.9°F

## 2019-04-11 DIAGNOSIS — J3489 Other specified disorders of nose and nasal sinuses: Secondary | ICD-10-CM

## 2019-04-11 NOTE — Progress Notes (Signed)
HPI: Cindy [name] is a 74 y.o. female who returns today for evaluation of nostril complaints.  She was seen 5 or 6 weeks ago with a nodule in the left nostril that was irritated by her nasal CPAP machine.  She has been using antibiotic ointment on this and it has done much better but still occasionally causes discomfort when she uses her nasal CPAP.  She wanted it rechecked today.  Past Medical History:  Diagnosis Date  . Abnormal liver function tests 07/19/2015  . Allergic state 02/01/2010   Qualifier: Diagnosis of  By: Regis Bill MD, Standley Brooking   . Allergy   . Anxiety   . Back pain   . Chicken pox 74 yrs old  . Depressive disorder, not elsewhere classified   . Diabetes ([place])    diet controlled- no medicines   . Fibroid   . History of fractured kneecap    right   . Hyperglycemia   . Hyperlipidemia   . Infertility, female   . Joint pain   . Knee pain, left 10/05/2011   Follows with Dr Margarette Canada  . Measles as a child  . Medicare annual wellness visit, subsequent 07/06/2014  . OA (osteoarthritis) of knee    left knee  . Obesity 10/05/2011  . Obesity 10/05/2011  . Osteoarthritis   . Osteopenia 06/26/2014  . Other and unspecified hyperlipidemia   . Overweight 10/05/2011  . Overweight(278.02) 10/05/2011  . Sleep apnea     On CPAP  . Sleep apnea   . Stye 11/19/2012  . Syncope 01/26/2017  . Thyroid disease    h/o hyperthyroid treated with radioactive iodine? from 16 to 20  . Type 2 diabetes mellitus with hyperlipidemia ([place])   . Varicose vein of leg    Past Surgical History:  Procedure Laterality Date  . BACK SURGERY     L5 discectomy, 2002. helpful  . CERVICAL BIOPSY  W/ LOOP ELECTRODE EXCISION    . CESAREAN SECTION  1987  . COLONOSCOPY    . CPAP    . KNEE SURGERY Right 2006   R knee fx. patella  . LEEP    . stitches in foot Right    Social History   Socioeconomic History  . Marital status: Divorced    Spouse name: Not on file  . Number of children: 1  . Years of education: Not on  file  . Highest education level: Not on file  Occupational History  . 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 Smoking status: Never Smoker  . Smokeless tobacco: Never Used  Substance and Sexual Activity  . Alcohol use: Yes    Alcohol/week: 1.0 standard drinks    Types: 1 Glasses of wine per week  . Drug use: No  . Sexual activity: Never    Partners: Male    Birth control/protection: Post-menopausal  Other Topics Concern  . Not on file  Social History Narrative   Recently divorced separated fall 2012    Now living in [place] area .   Never smoker   Regular exercise   Sleep 01/12/29 up at 8         Social Determinants of Health   Financial Resource Strain:   . Difficulty of Paying Living Expenses: Not on file  Food Insecurity:   . Worried About Charity fundraiser in the Last Year: Not on file  . Ran Out of Food in the  Last Year: Not on file  Transportation Needs:   . Lack of Transportation (Medical): Not on file  . Lack of Transportation (Non-Medical): Not on file  Physical Activity:   . Days of Exercise per Week: Not on file  . Minutes of Exercise per Session: Not on file  Stress:   . Feeling of Stress : Not on file  Social Connections:   . Frequency of Communication with Friends and Family: Not on file  . Frequency of Social Gatherings with Friends and Family: Not on file  . Attends Religious Services: Not on file  . Active Member of Clubs or Organizations: Not on file  . Attends Archivist Meetings: Not on file  . Marital Status: Not on file   Family History  Problem Relation Age of Onset  . Hyperlipidemia Mother   . Dementia Mother   . Obesity Mother   . Hyperlipidemia Father   . Heart disease Father        bypass, CAD  . Sleep apnea Father   . Hyperlipidemia Sister   . Diabetes Sister        type 2  . Colon polyps Sister   . Dementia Maternal Grandmother        alzheimer   . Alcohol abuse Maternal Grandfather   . Diabetes Paternal Grandmother        type 2?  . Colon cancer Neg Hx   . Esophageal cancer Neg Hx   . Rectal cancer Neg Hx   . Stomach cancer Neg Hx    Allergies  Allergen Reactions  . Codeine Other (See Comments)    hallucinations  . Crestor [Rosuvastatin]     REACTION: myalgias  . Other Other (See Comments)    NOVACAINE- blisters left side of mouth after receiving   . Amoxicillin Rash  . Erythromycin Other (See Comments)    cramps  . Sulfa Antibiotics Rash  . Sulfonamide Derivatives Rash   Prior to Admission medications   Medication Sig Start Date End Date Taking? Authorizing Provider  aspirin 81 MG tablet Take 81 mg by mouth daily.   Yes [provider]  cholecalciferol (VITAMIN D) 1000 units tablet Take 2,000 Units by mouth daily.    Yes [provider]  cycloSPORINE (RESTASIS) 0.05 % ophthalmic emulsion Place 1 drop into both eyes daily as needed. Reported on 07/09/2015   Yes [provider]  DULoxetine (CYMBALTA) 30 MG capsule TAKE ONE CAPSULE BY MOUTH EVERY OTHER DAY 04/05/19  Yes Mosie Lukes, MD  ezetimibe (ZETIA) 10 MG tablet Take 1 tablet (10 mg total) by mouth daily. 02/05/19  Yes Mosie Lukes, MD  fluticasone (FLONASE) 50 MCG/ACT nasal spray Place 1 spray into both nostrils as needed.    Yes [provider]  levocetirizine (XYZAL) 5 MG tablet Take 5 mg by mouth as needed.  12/05/14  Yes [provider]  metFORMIN (GLUCOPHAGE) 500 MG tablet TAKE ONE TABLET BY MOUTH TWICE A DAY WITH MEALS 04/07/19  Yes Mosie Lukes, MD  pravastatin (PRAVACHOL) 80 MG tablet TAKE ONE TABLET BY MOUTH DAILY 04/09/19  Yes Mosie Lukes, MD  Turmeric 500 MG CAPS Take by mouth.   Yes [provider]  fluticasone (FLONASE) 50 MCG/ACT nasal spray Place 2 sprays into both nostrils daily for 10 days. 05/31/18 06/10/18  Kara Dies, NP     Positive ROS: Otherwise negative  All other systems  have been reviewed and were otherwise negative with the exception  of those mentioned in the HPI and as above.  Physical Exam: Constitutional: Alert, well-appearing, no acute distress Ears: External ears without lesions or tenderness. Ear canals are clear bilaterally with intact, clear TMs.  Nasal: External nose without lesions.  On nasal rhinoscopy she has slight irritation along the left lower medial left nostril.  No gross ulceration of the mucous membrane.  No evidence of neoplasm.  This appears to be more irritative probably from use of the nasal CPAP.  No evidence of neoplasm.  Remaining nasal cavity is clear with minimal deformity of the septum.  No polyps noted.  No signs of infection. Oral: Lips and gums without lesions. Tongue and palate mucosa without lesions. Posterior oropharynx clear. Neck: No palpable adenopathy or masses Respiratory: Breathing comfortably  Skin: No facial/neck lesions or rash noted.  Procedures  Assessment: Mild left nasal vestibulitis secondary to use of nasal CPAP  Plan: Recommended possibly placing a small spot Band-Aid over the sore the nostril when she uses nasal CPAP on a as needed basis.  Can also apply lotion or cream over the irritated area or the mupirocin 2% ointment. She will follow-up if it becomes worse. Discussed also with her concerning may be changing the tips of the nasal CPAP delivery system to something that might be less irritative   Radene Journey, MD

## 2019-04-21 ENCOUNTER — Other Ambulatory Visit: Payer: Self-pay | Admitting: Family Medicine

## 2019-04-21 DIAGNOSIS — F339 Major depressive disorder, recurrent, unspecified: Secondary | ICD-10-CM

## 2019-04-24 ENCOUNTER — Ambulatory Visit: Payer: Medicare Other | Attending: Internal Medicine

## 2019-04-24 DIAGNOSIS — Z23 Encounter for immunization: Secondary | ICD-10-CM

## 2019-04-24 NOTE — Progress Notes (Signed)
   Covid-19 Vaccination Clinic  Name:  Cindy [name]    MRN: [id number] DOB: February 10, 1946  04/24/2019  [name] was observed post Covid-19 immunization for 15 minutes without incidence. She was provided with Vaccine Information Sheet and instruction to access the V-Safe system.   [name] was instructed to call 911 with any severe reactions post vaccine: Marland Kitchen Difficulty breathing  . Swelling of your face and throat  . A fast heartbeat  . A bad rash all over your body  . Dizziness and weakness    Immunizations Administered    Name Date Dose VIS Date Route   Pfizer COVID-19 Vaccine 04/24/2019 11:19 AM 0.3 mL 03/15/2019 Intramuscular   Manufacturer: [place]   Lot: [id number]   [place]: [id number]

## 2019-04-25 ENCOUNTER — Other Ambulatory Visit: Payer: Self-pay | Admitting: Family Medicine

## 2019-04-25 DIAGNOSIS — E119 Type 2 diabetes mellitus without complications: Secondary | ICD-10-CM

## 2019-04-30 ENCOUNTER — Other Ambulatory Visit: Payer: Self-pay | Admitting: *Deleted

## 2019-04-30 DIAGNOSIS — E119 Type 2 diabetes mellitus without complications: Secondary | ICD-10-CM

## 2019-04-30 MED ORDER — METFORMIN HCL 500 MG PO TABS: 500.0000 mg | ORAL_TABLET | Freq: Two times a day (BID) | ORAL | 0 refills | Status: DC

## 2019-04-30 NOTE — Telephone Encounter (Signed)
Received fax from [facility] for refills of metformin 500mg , 1 tablet twice a day and requesting 90 day supply. Refill sent.

## 2019-05-06 ENCOUNTER — Other Ambulatory Visit: Payer: Self-pay

## 2019-05-07 ENCOUNTER — Ambulatory Visit (INDEPENDENT_AMBULATORY_CARE_PROVIDER_SITE_OTHER): Payer: Medicare Other | Admitting: Family Medicine

## 2019-05-07 ENCOUNTER — Other Ambulatory Visit: Payer: Self-pay

## 2019-05-07 ENCOUNTER — Encounter: Payer: Self-pay | Admitting: Family Medicine

## 2019-05-07 VITALS — BP 120/64 | HR 64 | Temp 98.3°F | Resp 18 | Ht 64.5 in | Wt 161.2 lb

## 2019-05-07 DIAGNOSIS — E119 Type 2 diabetes mellitus without complications: Secondary | ICD-10-CM

## 2019-05-07 DIAGNOSIS — E7849 Other hyperlipidemia: Secondary | ICD-10-CM

## 2019-05-07 DIAGNOSIS — Z0000 Encounter for general adult medical examination without abnormal findings: Secondary | ICD-10-CM

## 2019-05-07 DIAGNOSIS — E1169 Type 2 diabetes mellitus with other specified complication: Secondary | ICD-10-CM

## 2019-05-07 DIAGNOSIS — F339 Major depressive disorder, recurrent, unspecified: Secondary | ICD-10-CM

## 2019-05-07 DIAGNOSIS — E559 Vitamin D deficiency, unspecified: Secondary | ICD-10-CM

## 2019-05-07 DIAGNOSIS — E785 Hyperlipidemia, unspecified: Secondary | ICD-10-CM

## 2019-05-07 MED ORDER — DULOXETINE HCL 30 MG PO CPEP: 30.0000 mg | ORAL_CAPSULE | ORAL | 3 refills | Status: DC

## 2019-05-07 MED ORDER — EZETIMIBE 10 MG PO TABS: 10.0000 mg | ORAL_TABLET | Freq: Every day | ORAL | 3 refills | Status: DC

## 2019-05-07 MED ORDER — METFORMIN HCL 500 MG PO TABS: 500.0000 mg | ORAL_TABLET | Freq: Two times a day (BID) | ORAL | 3 refills | Status: DC

## 2019-05-07 NOTE — Assessment & Plan Note (Signed)
Encouraged heart healthy diet, increase exercise, avoid trans fats, consider a krill oil cap daily. Tolerating Pravastatin  

## 2019-05-07 NOTE — Assessment & Plan Note (Signed)
Encouraged heart healthy diet, increase exercise, avoid trans fats, consider a krill oil cap daily 

## 2019-05-07 NOTE — Assessment & Plan Note (Addendum)
Patient encouraged to maintain heart healthy diet, regular exercise, adequate sleep. Consider daily probiotics. Take medications as prescribed. Labs reviewed. Last colonoscopy 2018 due again 2028, mgm September 2020 due again in 12/2019

## 2019-05-07 NOTE — Patient Instructions (Signed)
Multivitamin with minerals with selenium Vitamin D 04-1998 IU daily Probiotic daily, lactobacillus and bifidophilus Aspirin EC 81 mg daily  Melatonin 2-5 mg at bedtime  Omron blood pressure cuff, upper arm   Weekly vitals  [email or web address] Preventive Care 72 Years and Older, Female Preventive care refers to lifestyle choices and visits with your health care provider that can promote health and wellness. This includes:  A yearly physical exam. This is also called an annual well check.  Regular dental and eye exams.  Immunizations.  Screening for certain conditions.  Healthy lifestyle choices, such as diet and exercise. What can I expect for my preventive care visit? Physical exam Your health care provider will check:  Height and weight. These may be used to calculate body mass index (BMI), which is a measurement that tells if you are at a healthy weight.  Heart rate and blood pressure.  Your skin for abnormal spots. Counseling Your health care provider may ask you questions about:  Alcohol, tobacco, and drug use.  Emotional well-being.  Home and relationship well-being.  Sexual activity.  Eating habits.  History of falls.  Memory and ability to understand (cognition).  Work and work Statistician.  Pregnancy and menstrual history. What immunizations do I need?  Influenza (flu) vaccine  This is recommended every year. Tetanus, diphtheria, and pertussis (Tdap) vaccine  You may need a Td booster every 10 years. Varicella (chickenpox) vaccine  You may need this vaccine if you have not already been vaccinated. Zoster (shingles) vaccine  You may need this after age 72. Pneumococcal conjugate (PCV13) vaccine  One dose is recommended after age 72. Pneumococcal polysaccharide (PPSV23) vaccine  One dose is recommended after age 3. Measles, mumps, and rubella (MMR) vaccine  You may need at least one dose of MMR if you  were born in 1957 or later. You may also need a second dose. Meningococcal conjugate (MenACWY) vaccine  You may need this if you have certain conditions. Hepatitis A vaccine  You may need this if you have certain conditions or if you travel or work in places where you may be exposed to hepatitis A. Hepatitis B vaccine  You may need this if you have certain conditions or if you travel or work in places where you may be exposed to hepatitis B. Haemophilus influenzae type b (Hib) vaccine  You may need this if you have certain conditions. You may receive vaccines as individual doses or as more than one vaccine together in one shot (combination vaccines). Talk with your health care provider about the risks and benefits of combination vaccines. What tests do I need? Blood tests  Lipid and cholesterol levels. These may be checked every 5 years, or more frequently depending on your overall health.  Hepatitis C test.  Hepatitis B test. Screening  Lung cancer screening. You may have this screening every year starting at age 54 if you have a 30-pack-year history of smoking and currently smoke or have quit within the past 15 years.  Colorectal cancer screening. All adults should have this screening starting at age 36 and continuing until age 79. Your health care provider may recommend screening at age 87 if you are at increased risk. You will have tests every 1-10 years, depending on your results and the type of screening test.  Diabetes screening. This is done by checking your blood sugar (glucose) after you have not eaten for a while (fasting). You may have this done every 1-3 years.  Mammogram. This may  be done every 1-2 years. Talk with your health care provider about how often you should have regular mammograms.  BRCA-related cancer screening. This may be done if you have a family history of breast, ovarian, tubal, or peritoneal cancers. Other tests  Sexually transmitted disease (STD)  testing.  Bone density scan. This is done to screen for osteoporosis. You may have this done starting at age 37. Follow these instructions at home: Eating and drinking  Eat a diet that includes fresh fruits and vegetables, whole grains, lean protein, and low-fat dairy products. Limit your intake of foods with high amounts of sugar, saturated fats, and salt.  Take vitamin and mineral supplements as recommended by your health care provider.  Do not drink alcohol if your health care provider tells you not to drink.  If you drink alcohol: ? Limit how much you have to 0-1 drink a day. ? Be aware of how much alcohol is in your drink. In the [place] one drink equals one 12 oz bottle of beer (355 mL), one 5 oz glass of wine (148 mL), or one 1 oz glass of hard liquor (44 mL). Lifestyle  Take daily care of your teeth and gums.  Stay active. Exercise for at least 30 minutes on 5 or more days each week.  Do not use any products that contain nicotine or tobacco, such as cigarettes, e-cigarettes, and chewing tobacco. If you need help quitting, ask your health care provider.  If you are sexually active, practice safe sex. Use a condom or other form of protection in order to prevent STIs (sexually transmitted infections).  Talk with your health care provider about taking a low-dose aspirin or statin. What's next?  Go to your health care provider once a year for a well check visit.  Ask your health care provider how often you should have your eyes and teeth checked.  Stay up to date on all vaccines. This information is not intended to replace advice given to you by your health care provider. Make sure you discuss any questions you have with your health care provider. Document Revised: 03/15/2018 Document Reviewed: 03/15/2018 Elsevier Patient Education  2020 [employer].

## 2019-05-07 NOTE — Assessment & Plan Note (Signed)
hgba1c acceptable, minimize simple carbs. Increase exercise as tolerated. Continue current meds 

## 2019-05-08 NOTE — Assessment & Plan Note (Signed)
Supplement and monitor 

## 2019-05-08 NOTE — Assessment & Plan Note (Signed)
Refill given on Cymbalta which has been helpful

## 2019-05-08 NOTE — Progress Notes (Signed)
Subjective:    Patient ID: Cindy [name], female    DOB: 04/17/1945, 74 y.o.   MRN: [id number]  Chief Complaint  Patient presents with  . Annual Exam    HPI Patient is in today for annual preventative exam and follow up on chronic medical concerns. No recent febrile illness or hospitalizations. No recent acute concerns. She is maintaining masking and quarantining well. Her greatest stress has been that she has been unable to travel to the [facility] to see her grandkids especially since her kids do not wear masks or want vaccines. She has her first shot set up for this week and is relieved by that. Denies CP/palp/SOB/HA/congestion/fevers/GI or GU c/o. Taking meds as prescribed  Past Medical History:  Diagnosis Date  . Abnormal liver function tests 07/19/2015  . Allergic state 02/01/2010   Qualifier: Diagnosis of  By: Regis Bill MD, Standley Brooking   . Allergy   . Anxiety   . Back pain   . Chicken pox 74 yrs old  . Depressive disorder, not elsewhere classified   . Diabetes ([place])    diet controlled- no medicines   . Fibroid   . History of fractured kneecap    right   . Hyperglycemia   . Hyperlipidemia   . Infertility, female   . Joint pain   . Knee pain, left 10/05/2011   Follows with Dr Margarette Canada  . Measles as a child  . Medicare annual wellness visit, subsequent 07/06/2014  . OA (osteoarthritis) of knee    left knee  . Obesity 10/05/2011  . Obesity 10/05/2011  . Osteoarthritis   . Osteopenia 06/26/2014  . Other and unspecified hyperlipidemia   . Overweight 10/05/2011  . Overweight(278.02) 10/05/2011  . Sleep apnea     On CPAP  . Sleep apnea   . Stye 11/19/2012  . Syncope 01/26/2017  . Thyroid disease    h/o hyperthyroid treated with radioactive iodine? from 16 to 20  . Type 2 diabetes mellitus with hyperlipidemia ([place])   . Varicose vein of leg     Past Surgical History:  Procedure Laterality Date  . BACK SURGERY     L5 discectomy, 2002. helpful  . CERVICAL BIOPSY  W/ LOOP ELECTRODE  EXCISION    . CESAREAN SECTION  1987  . COLONOSCOPY    . CPAP    . KNEE SURGERY Right 2006   R knee fx. patella  . LEEP    . stitches in foot Right     Family History  Problem Relation Age of Onset  . Hyperlipidemia Mother   . Dementia Mother   . Obesity Mother   . Hyperlipidemia Father   . Heart disease Father        bypass, CAD  . Sleep apnea Father   . Hyperlipidemia Sister   . Diabetes Sister        type 2  . Colon polyps Sister   . Dementia Maternal Grandmother        alzheimer  . Alcohol abuse Maternal Grandfather   . Diabetes Paternal Grandmother        type 2?  . Colon cancer Neg Hx   . Esophageal cancer Neg Hx   . Rectal cancer Neg Hx   . Stomach cancer Neg Hx     Social History   Socioeconomic History  . Marital status: Divorced    Spouse name: Not on file  . Number of children: 1  . Years of education: Not on file  .  Highest education level: Not on file  Occupational History  . 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 Smoking status: Never Smoker  . Smokeless tobacco: Never Used  Substance and Sexual Activity  . Alcohol use: Yes    Alcohol/week: 1.0 standard drinks    Types: 1 Glasses of wine per week  . Drug use: No  . Sexual activity: Never    Partners: Male    Birth control/protection: Post-menopausal  Other Topics Concern  . Not on file  Social History Narrative   Recently divorced separated fall 2012    Now living in [place] area .   Never smoker   Regular exercise   Sleep 01/12/29 up at 8         Social Determinants of Health   Financial Resource Strain:   . Difficulty of Paying Living Expenses: Not on file  Food Insecurity:   . Worried About Charity fundraiser in the Last Year: Not on file  . Ran Out of Food in the Last Year: Not on file  Transportation Needs:   . Lack of Transportation (Medical): Not on file  . Lack of Transportation (Non-Medical): Not on  file  Physical Activity:   . Days of Exercise per Week: Not on file  . Minutes of Exercise per Session: Not on file  Stress:   . Feeling of Stress : Not on file  Social Connections:   . Frequency of Communication with Friends and Family: Not on file  . Frequency of Social Gatherings with Friends and Family: Not on file  . Attends Religious Services: Not on file  . Active Member of Clubs or Organizations: Not on file  . Attends Archivist Meetings: Not on file  . Marital Status: Not on file  Intimate Partner Violence:   . Fear of Current or Ex-Partner: Not on file  . Emotionally Abused: Not on file  . Physically Abused: Not on file  . Sexually Abused: Not on file    Outpatient Medications Prior to Visit  Medication Sig Dispense Refill  . aspirin 81 MG tablet Take 81 mg by mouth daily.    . cholecalciferol (VITAMIN D) 1000 units tablet Take 2,000 Units by mouth daily.     . cycloSPORINE (RESTASIS) 0.05 % ophthalmic emulsion Place 1 drop into both eyes daily as needed. Reported on 07/09/2015    . fluticasone (FLONASE) 50 MCG/ACT nasal spray Place 1 spray into both nostrils as needed.     Marland Kitchen levocetirizine (XYZAL) 5 MG tablet Take 5 mg by mouth as needed.   3  . pravastatin (PRAVACHOL) 80 MG tablet TAKE ONE TABLET BY MOUTH DAILY 90 tablet 0  . Turmeric 500 MG CAPS Take by mouth.    . DULoxetine (CYMBALTA) 30 MG capsule TAKE ONE CAPSULE BY MOUTH EVERY OTHER DAY 16 capsule 0  . ezetimibe (ZETIA) 10 MG tablet Take 1 tablet (10 mg total) by mouth daily. 90 tablet 1  . metFORMIN (GLUCOPHAGE) 500 MG tablet Take 1 tablet (500 mg total) by mouth 2 (two) times daily with a meal. 180 tablet 0  . fluticasone (FLONASE) 50 MCG/ACT nasal spray Place 2 sprays into both nostrils daily for 10 days. 16 g 0   No facility-administered medications prior to visit.    Allergies  Allergen Reactions  . Codeine Other (See Comments)    hallucinations  . Crestor [Rosuvastatin]     REACTION: myalgias   .  Other Other (See Comments)    NOVACAINE- blisters left side of mouth after receiving   . Amoxicillin Rash  . Erythromycin Other (See Comments)    cramps  . Sulfa Antibiotics Rash  . Sulfonamide Derivatives Rash    Review of Systems  Constitutional: Negative for chills, fever and malaise/fatigue.  HENT: Negative for congestion and hearing loss.   Eyes: Negative for discharge.  Respiratory: Negative for cough, sputum production and shortness of breath.   Cardiovascular: Negative for chest pain, palpitations and leg swelling.  Gastrointestinal: Negative for abdominal pain, blood in stool, constipation, diarrhea, heartburn, nausea and vomiting.  Genitourinary: Negative for dysuria, frequency, hematuria and urgency.  Musculoskeletal: Negative for back pain, falls and myalgias.  Skin: Negative for rash.  Neurological: Negative for dizziness, sensory change, loss of consciousness, weakness and headaches.  Endo/Heme/Allergies: Negative for environmental allergies. Does not bruise/bleed easily.  Psychiatric/Behavioral: Negative for depression and suicidal ideas. The patient is not nervous/anxious and does not have insomnia.        Objective:    Physical Exam Constitutional:      General: She is not in acute distress.    Appearance: She is not diaphoretic.  HENT:     Head: Normocephalic and atraumatic.     Right Ear: External ear normal.     Left Ear: External ear normal.     Nose: Nose normal.     Mouth/Throat:     Pharynx: No oropharyngeal exudate.  Eyes:     General: No scleral icterus.       Right eye: No discharge.        Left eye: No discharge.     Conjunctiva/sclera: Conjunctivae normal.     Pupils: Pupils are equal, round, and reactive to light.  Neck:     Thyroid: No thyromegaly.  Cardiovascular:     Rate and Rhythm: Normal rate and regular rhythm.     Heart sounds: Normal heart sounds. No murmur.  Pulmonary:     Effort: Pulmonary effort is normal. No respiratory  distress.     Breath sounds: Normal breath sounds. No wheezing or rales.  Abdominal:     General: Bowel sounds are normal. There is no distension.     Palpations: Abdomen is soft. There is no mass.     Tenderness: There is no abdominal tenderness.  Musculoskeletal:        General: No tenderness. Normal range of motion.     Cervical back: Normal range of motion and neck supple.  Lymphadenopathy:     Cervical: No cervical adenopathy.  Skin:    General: Skin is warm and dry.     Findings: No rash.  Neurological:     Mental Status: She is alert and oriented to person, place, and time.     Cranial Nerves: No cranial nerve deficit.     Coordination: Coordination normal.     Deep Tendon Reflexes: Reflexes are normal and symmetric. Reflexes normal.     BP 120/64 (BP Location: Left Arm, Patient Position: Sitting, Cuff Size: Normal)   Pulse 64   Temp 98.3 F (36.8 C) (Temporal)   Resp 18   Ht 5' 4.5" (1.638 m)   Wt 161 lb 3.2 oz (73.1 kg)   LMP 04/04/2000   SpO2 98%   BMI 27.24 kg/m  Wt Readings from Last 3 Encounters:  05/07/19 161 lb 3.2 oz (73.1 kg)  05/31/18 175 lb (79.4 kg)  05/22/18 169 lb (76.7 kg)    Diabetic Foot  Exam - Simple   No data filed     Lab Results  Component Value Date   WBC 7.1 02/22/2019   HGB 13.7 02/22/2019   HCT 40.8 02/22/2019   PLT 292.0 02/22/2019   GLUCOSE 87 02/22/2019   CHOL 166 02/22/2019   TRIG 105.0 02/22/2019   HDL 51.30 02/22/2019   LDLDIRECT 145.4 11/14/2012   LDLCALC 93 02/22/2019   ALT 16 02/22/2019   AST 15 02/22/2019   NA 140 02/22/2019   K 4.6 02/22/2019   CL 103 02/22/2019   CREATININE 0.76 02/22/2019   BUN 20 02/22/2019   CO2 31 02/22/2019   TSH 1.04 02/22/2019   HGBA1C 6.0 02/22/2019   MICROALBUR <0.7 01/09/2015    Lab Results  Component Value Date   TSH 1.04 02/22/2019   Lab Results  Component Value Date   WBC 7.1 02/22/2019   HGB 13.7 02/22/2019   HCT 40.8 02/22/2019   MCV 89.5 02/22/2019   PLT 292.0  02/22/2019   Lab Results  Component Value Date   NA 140 02/22/2019   K 4.6 02/22/2019   CO2 31 02/22/2019   GLUCOSE 87 02/22/2019   BUN 20 02/22/2019   CREATININE 0.76 02/22/2019   BILITOT 0.6 02/22/2019   ALKPHOS 78 02/22/2019   AST 15 02/22/2019   ALT 16 02/22/2019   PROT 6.7 02/22/2019   ALBUMIN 4.2 02/22/2019   CALCIUM 10.0 02/22/2019   GFR 74.89 02/22/2019   Lab Results  Component Value Date   CHOL 166 02/22/2019   Lab Results  Component Value Date   HDL 51.30 02/22/2019   Lab Results  Component Value Date   LDLCALC 93 02/22/2019   Lab Results  Component Value Date   TRIG 105.0 02/22/2019   Lab Results  Component Value Date   CHOLHDL 3 02/22/2019   Lab Results  Component Value Date   HGBA1C 6.0 02/22/2019       Assessment & Plan:   Problem List Items Addressed This Visit    Hyperlipidemia    Encouraged heart healthy diet, increase exercise, avoid trans fats, consider a krill oil cap daily      Relevant Medications   ezetimibe (ZETIA) 10 MG tablet   Depression, recurrent ([place])    Refill given on Cymbalta which has been helpful      Relevant Medications   DULoxetine (CYMBALTA) 30 MG capsule   Preventative health care    Patient encouraged to maintain heart healthy diet, regular exercise, adequate sleep. Consider daily probiotics. Take medications as prescribed. Labs reviewed. Last colonoscopy 2018 due again 2028, mgm September 2020 due again in 12/2019      Type 2 diabetes mellitus with hyperlipidemia ([facility])    hgba1c acceptable, minimize simple carbs. Increase exercise as tolerated. Continue current meds      Relevant Medications   metFORMIN (GLUCOPHAGE) 500 MG tablet   ezetimibe (ZETIA) 10 MG tablet   Vitamin D deficiency    Supplement and monitor      Other hyperlipidemia    Encouraged heart healthy diet, increase exercise, avoid trans fats, consider a krill oil cap daily. Tolerating Pravastatin      Relevant Medications   ezetimibe  (ZETIA) 10 MG tablet    Other Visit Diagnoses    Type 2 diabetes mellitus without complication, without long-term current use of insulin ([facility])       Relevant Medications   metFORMIN (GLUCOPHAGE) 500 MG tablet      I have changed Anhar R. Albo's  DULoxetine. I am also having her maintain her cycloSPORINE, levocetirizine, fluticasone, cholecalciferol, Turmeric, aspirin, pravastatin, metFORMIN, and ezetimibe.  Meds ordered this encounter  Medications  . metFORMIN (GLUCOPHAGE) 500 MG tablet    Sig: Take 1 tablet (500 mg total) by mouth 2 (two) times daily with a meal.    Dispense:  180 tablet    Refill:  3  . ezetimibe (ZETIA) 10 MG tablet    Sig: Take 1 tablet (10 mg total) by mouth daily.    Dispense:  90 tablet    Refill:  3  . DULoxetine (CYMBALTA) 30 MG capsule    Sig: Take 1 capsule (30 mg total) by mouth every other day.    Dispense:  16 capsule    Refill:  3     Penni Homans, MD

## 2019-05-13 ENCOUNTER — Ambulatory Visit: Payer: Medicare Other | Attending: Internal Medicine

## 2019-05-13 DIAGNOSIS — Z23 Encounter for immunization: Secondary | ICD-10-CM

## 2019-05-13 NOTE — Progress Notes (Signed)
   Covid-19 Vaccination Clinic  Name:  DEYANA BABINEAUX    MRN: [id number] DOB: 08/17/1945  05/13/2019  Ms. Khong was observed post Covid-19 immunization for 15 minutes without incidence. She was provided with Vaccine Information Sheet and instruction to access the V-Safe system.   Ms. Andros was instructed to call 911 with any severe reactions post vaccine: Marland Kitchen Difficulty breathing  . Swelling of your face and throat  . A fast heartbeat  . A bad rash all over your body  . Dizziness and weakness    Immunizations Administered    Name Date Dose VIS Date Route   Pfizer COVID-19 Vaccine 05/13/2019  6:02 PM 0.3 mL 03/15/2019 Intramuscular   Manufacturer: [place]   Lot: [id number]   [place]: [id number]

## 2019-06-25 ENCOUNTER — Encounter: Payer: Self-pay | Admitting: Certified Nurse Midwife

## 2019-07-05 ENCOUNTER — Encounter: Payer: Self-pay | Admitting: Family Medicine

## 2019-07-05 DIAGNOSIS — F339 Major depressive disorder, recurrent, unspecified: Secondary | ICD-10-CM

## 2019-07-08 ENCOUNTER — Other Ambulatory Visit: Payer: Self-pay | Admitting: Family Medicine

## 2019-07-08 MED ORDER — DULOXETINE HCL 30 MG PO CPEP: 30.0000 mg | ORAL_CAPSULE | ORAL | 0 refills | Status: DC

## 2019-07-16 DIAGNOSIS — R350 Frequency of micturition: Secondary | ICD-10-CM | POA: Diagnosis not present

## 2019-07-24 DIAGNOSIS — R509 Fever, unspecified: Secondary | ICD-10-CM | POA: Diagnosis not present

## 2019-08-30 ENCOUNTER — Ambulatory Visit (INDEPENDENT_AMBULATORY_CARE_PROVIDER_SITE_OTHER): Payer: Medicare Other | Admitting: Podiatry

## 2019-08-30 ENCOUNTER — Encounter: Payer: Self-pay | Admitting: Podiatry

## 2019-08-30 ENCOUNTER — Other Ambulatory Visit: Payer: Self-pay

## 2019-08-30 DIAGNOSIS — B070 Plantar wart: Secondary | ICD-10-CM

## 2019-08-30 DIAGNOSIS — Q828 Other specified congenital malformations of skin: Secondary | ICD-10-CM

## 2019-09-03 NOTE — Progress Notes (Signed)
Subjective:   Patient ID: Cindy [name], female   DOB: 74 y.o.   MRN: [id number]   HPI Patient feels like I am walking on something of the bottom of my left foot even though I do not remember specific injury.  It sharp when I try to bear weight.  Patient states it is been there for several months and patient has no other issues   ROS      Objective:  Physical Exam  Neurovascular status intact with patient's left foot showing lesion plantar that is relatively painful when pressed and make shoe gear difficult.  It measures 7 x 7 mm and there is pain to lateral pressure     Assessment:   Probability for verruca plantaris plantar left versus porokeratotic or corn callus formation    Plan:  H&P education rendered discussed all different combinations of what can occur.  Sterile debridement of the lesion accomplished no iatrogenic bleeding and I then applied chemical agent to create immune response with sterile dressing and reappoint as needed

## 2019-09-06 ENCOUNTER — Other Ambulatory Visit: Payer: Self-pay | Admitting: Family Medicine

## 2019-09-06 DIAGNOSIS — F339 Major depressive disorder, recurrent, unspecified: Secondary | ICD-10-CM

## 2019-09-18 NOTE — Progress Notes (Signed)
I connected with [name] today by telephone and verified that I am speaking with the correct person using two identifiers. Location patient: home Location provider: work Persons participating in the virtual visit: patient, Marine scientist.    I discussed the limitations, risks, security and privacy concerns of performing an evaluation and management service by telephone and the availability of in person appointments. I also discussed with the patient that there may be a patient responsible charge related to this service. The patient expressed understanding and verbally consented to this telephonic visit.    Interactive audio and video telecommunications were attempted between this RN and patient, however failed, due to patient having technical difficulties OR patient did not have access to video capability.  We continued and completed visit with audio only.  Some vital signs may be absent or patient reported.   Subjective:   Cindy [name] is a 74 y.o. female who presents for Medicare Annual (Subsequent) preventive examination.  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  Review of Systems: Home Safety/Smoke Alarms: Feels safe in home. Smoke alarms in place.  Lives alone w/ 1 dog and 1 cat.   Female:         Mammo- 12/11/18      Dexa scan-  07/23/14. ordered     CCS-04/18/16. Recall 10 yrs.   Objective:     Vitals: Wt 155 lb (70.3 kg)   LMP 04/04/2000   BMI 26.19 kg/m   Body mass index is 26.19 kg/m.  Advanced Directives 09/19/2019 10/04/2016 02/16/2016 12/04/2014 11/27/2014 06/26/2014  Does Patient Have a Medical Advance Directive? No No No No No No  Would patient like information on creating a medical advance directive? No - Patient declined - - No - patient declined information - Yes Higher education careers adviser given    Tobacco Social History   Tobacco Use  Smoking Status Never Smoker  Smokeless Tobacco Never Used     Counseling given: Not Answered   Clinical Intake:  Pain :  No/denies pain     Past Medical History:  Diagnosis Date  . Abnormal liver function tests 07/19/2015  . Allergic state 02/01/2010   Qualifier: Diagnosis of  By: Regis Bill MD, Standley Brooking   . Allergy   . Anxiety   . Back pain   . Chicken pox 74 yrs old  . Depressive disorder, not elsewhere classified   . Diabetes ([place])    diet controlled- no medicines   . Fibroid   . History of fractured kneecap    right   . Hyperglycemia   . Hyperlipidemia   . Infertility, female   . Joint pain   . Knee pain, left 10/05/2011   Follows with Dr Margarette Canada  . Measles as a child  . Medicare annual wellness visit, subsequent 07/06/2014  . OA (osteoarthritis) of knee    left knee  . Obesity 10/05/2011  . Obesity 10/05/2011  . Osteoarthritis   . Osteopenia 06/26/2014  . Other and unspecified hyperlipidemia   . Overweight 10/05/2011  . Overweight(278.02) 10/05/2011  . Sleep apnea     On CPAP  . Sleep apnea   . Stye 11/19/2012  . Syncope 01/26/2017  . Thyroid disease    h/o hyperthyroid treated with radioactive iodine? from 16 to 20  . Type 2 diabetes mellitus with hyperlipidemia ([place])   . Varicose vein of leg    Past Surgical History:  Procedure Laterality Date  . BACK SURGERY     L5 discectomy, 2002. helpful  .  CERVICAL BIOPSY  W/ LOOP ELECTRODE EXCISION    . CESAREAN SECTION  1987  . COLONOSCOPY    . CPAP    . KNEE SURGERY Right 2006   R knee fx. patella  . LEEP    . stitches in foot Right    Family History  Problem Relation Age of Onset  . Hyperlipidemia Mother   . Dementia Mother   . Obesity Mother   . Hyperlipidemia Father   . Heart disease Father        bypass, CAD  . Sleep apnea Father   . Hyperlipidemia Sister   . Diabetes Sister        type 2  . Colon polyps Sister   . Dementia Maternal Grandmother        alzheimer  . Alcohol abuse Maternal Grandfather   . Diabetes Paternal Grandmother        type 2?  . Colon cancer Neg Hx   . Esophageal cancer Neg Hx   . Rectal cancer Neg Hx     . Stomach cancer Neg Hx    Social History   Socioeconomic History  . Marital status: Divorced    Spouse name: Not on file  . Number of children: 1  . Years of education: Not on file  . Highest education level: Not on file  Occupational History  . 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 Smoking status: Never Smoker  . Smokeless tobacco: Never Used  Substance and Sexual Activity  . Alcohol use: Yes    Alcohol/week: 1.0 standard drink    Types: 1 Glasses of wine per week  . Drug use: No  . Sexual activity: Never    Partners: Male    Birth control/protection: Post-menopausal  Other Topics Concern  . Not on file  Social History Narrative   Recently divorced separated fall 2012    Now living in [place] area .   Never smoker   Regular exercise   Sleep 01/12/29 up at 8         Social Determinants of Health   Financial Resource Strain: Low Risk   . Difficulty of Paying Living Expenses: Not hard at all  Food Insecurity: No Food Insecurity  . Worried About Charity fundraiser in the Last Year: Never true  . Ran Out of Food in the Last Year: Never true  Transportation Needs: No Transportation Needs  . Lack of Transportation (Medical): No  . Lack of Transportation (Non-Medical): No  Physical Activity:   . Days of Exercise per Week:   . Minutes of Exercise per Session:   Stress:   . Feeling of Stress :   Social Connections:   . Frequency of Communication with Friends and Family:   . Frequency of Social Gatherings with Friends and Family:   . Attends Religious Services:   . Active Member of Clubs or Organizations:   . Attends Archivist Meetings:   Marland Kitchen Marital Status:     Outpatient Encounter Medications as of 09/19/2019  Medication Sig  . aspirin 81 MG tablet Take 81 mg by mouth daily.  . cholecalciferol (VITAMIN D) 1000 units tablet Take 2,000 Units by mouth daily.   . DULoxetine (CYMBALTA)  30 MG capsule TAKE ONE CAPSULE BY MOUTH EVERY OTHER DAY  . ezetimibe (ZETIA) 10 MG tablet Take 1 tablet (10 mg total) by mouth daily.  . fluticasone (FLONASE) 50 MCG/ACT  nasal spray Place 1 spray into both nostrils as needed.   Marland Kitchen levocetirizine (XYZAL) 5 MG tablet Take 5 mg by mouth as needed.   . metFORMIN (GLUCOPHAGE) 500 MG tablet Take 1 tablet (500 mg total) by mouth 2 (two) times daily with a meal.  . pravastatin (PRAVACHOL) 80 MG tablet TAKE ONE TABLET BY MOUTH DAILY  . Turmeric 500 MG CAPS Take by mouth.  . [DISCONTINUED] cycloSPORINE (RESTASIS) 0.05 % ophthalmic emulsion Place 1 drop into both eyes daily as needed. Reported on 07/09/2015   No facility-administered encounter medications on file as of 09/19/2019.    Activities of Daily Living In your present state of health, do you have any difficulty performing the following activities: 09/19/2019  Hearing? Y  Comment wearing hearing aids.  Vision? N  Difficulty concentrating or making decisions? N  Walking or climbing stairs? N  Dressing or bathing? N  Doing errands, shopping? N  Preparing Food and eating ? N  Using the Toilet? N  In the past six months, have you accidently leaked urine? N  Do you have problems with loss of bowel control? N  Managing your Medications? N  Managing your Finances? N  Housekeeping or managing your Housekeeping? N  Some recent data might be hidden    Patient Care Team: Mosie Lukes, MD as PCP - General (Family Medicine) Regina Eck, CNM as Referring Physician (Certified Nurse Midwife) Deneise Lever, MD as Consulting Physician (Pulmonary Disease) Jaclyn Prime, [place] as Referring Physician (Chiropractic Medicine) Marica Otter, [place] (Optometry) Crista Luria, MD as Consulting Physician (Dermatology) Elam Dutch, MD as Consulting Physician (Vascular Surgery) Monna Fam, MD as Consulting Physician (Ophthalmology)    Assessment:   This is a routine wellness examination for  Darleene. Physical assessment deferred to PCP.  Exercise Activities and Dietary recommendations Current Exercise Habits: Home exercise routine, Type of exercise: walking, Time (Minutes): 30, Frequency (Times/Week): 5, Weekly Exercise (Minutes/Week): 150, Intensity: Mild, Exercise limited by: None identified Diet (meal preparation, eat out, water intake, caffeinated beverages, dairy products, fruits and vegetables): in general, a "healthy" diet  , well balanced  Goals    . Continue to exercise.       Fall Risk Fall Risk  09/19/2019 07/31/2017 07/25/2016 02/11/2016 12/04/2014  Falls in the past year? 1 Yes No No No  Comment tripped over dog - - - -  Number falls in past yr: 0 1 - - -  Injury with Fall? 0 - - - -  Risk for fall due to : - - - - Other (Comment)  Risk for fall due to: Comment - - - - -  Follow up Education provided;Falls prevention discussed - - - -   Depression Screen PHQ 2/9 Scores 09/19/2019 07/31/2017 01/10/2017 07/25/2016  PHQ - 2 Score 0 0 3 0  PHQ- 9 Score - - 9 -  Exception Documentation - - - -     Cognitive Function Ad8 score reviewed for issues:  Issues making decisions:no  Less interest in hobbies / activities:no  Repeats questions, stories (family complaining):no  Trouble using ordinary gadgets (microwave, computer, phone):no  Forgets the month or year: no  Mismanaging finances: no  Remembering appts:no  Daily problems with thinking and/or memory:no Ad8 score is=0         Immunization History  Administered Date(s) Administered  . Influenza Split 06/02/2012, 12/30/2014  . Influenza Whole 03/19/2007  . Influenza, High Dose Seasonal PF 01/26/2017  . Influenza,inj,Quad PF,6+ Mos 01/02/2014,  12/08/2015  . Influenza-Unspecified 12/03/2012  . PFIZER SARS-COV-2 Vaccination 04/24/2019, 05/13/2019  . Pneumococcal Conjugate-13 01/09/2015  . Pneumococcal Polysaccharide-23 07/25/2016  . Td 11/08/2005  . Tdap 06/02/2012  . Zoster 10/05/2011  . Zoster  Recombinat (Shingrix) 11/26/2017    Screening Tests Health Maintenance  Topic Date Due  . FOOT EXAM  06/26/2015  . URINE MICROALBUMIN  01/09/2016  . OPHTHALMOLOGY EXAM  09/15/2016  . HEMOGLOBIN A1C  08/22/2019  . INFLUENZA VACCINE  11/03/2019  . MAMMOGRAM  12/07/2019  . TETANUS/TDAP  06/03/2022  . COLONOSCOPY  04/18/2026  . DEXA SCAN  Completed  . COVID-19 Vaccine  Completed  . Hepatitis C Screening  Completed  . PNA vac Low Risk Adult  Completed      Plan:    Please schedule your next medicare wellness visit with me in 1 yr.  Continue to eat heart healthy diet (full of fruits, vegetables, whole grains, lean protein, water--limit salt, fat, and sugar intake) and increase physical activity as tolerated.  Continue doing brain stimulating activities (puzzles, reading, adult coloring books, staying active) to keep memory sharp.   Bring a copy of your living will and/or healthcare power of attorney to your next office visit.   I have personally reviewed and noted the following in the patient's chart:   . Medical and social history . Use of alcohol, tobacco or illicit drugs  . Current medications and supplements . Functional ability and status . Nutritional status . Physical activity . Advanced directives . List of other physicians . Hospitalizations, surgeries, and ER visits in previous 12 months . Vitals . Screenings to include cognitive, depression, and falls . Referrals and appointments  In addition, I have reviewed and discussed with patient certain preventive protocols, quality metrics, and best practice recommendations. A written personalized care plan for preventive services as well as general preventive health recommendations were provided to patient.   Due to this being a telephonic visit, the after visit summary with patients personalized plan was offered to patient via mail or my-chart. Patient would like to access on my-chart.   Shela Nevin,  [place]  09/19/2019

## 2019-09-19 ENCOUNTER — Encounter: Payer: Self-pay | Admitting: *Deleted

## 2019-09-19 ENCOUNTER — Ambulatory Visit (INDEPENDENT_AMBULATORY_CARE_PROVIDER_SITE_OTHER): Payer: Medicare Other | Admitting: *Deleted

## 2019-09-19 VITALS — Wt 155.0 lb

## 2019-09-19 DIAGNOSIS — Z0000 Encounter for general adult medical examination without abnormal findings: Secondary | ICD-10-CM

## 2019-09-19 DIAGNOSIS — Z780 Asymptomatic menopausal state: Secondary | ICD-10-CM

## 2019-09-19 NOTE — Patient Instructions (Signed)
Please schedule your next medicare wellness visit with me in 1 yr.  Continue to eat heart healthy diet (full of fruits, vegetables, whole grains, lean protein, water--limit salt, fat, and sugar intake) and increase physical activity as tolerated.  Continue doing brain stimulating activities (puzzles, reading, adult coloring books, staying active) to keep memory sharp.   Bring a copy of your living will and/or healthcare power of attorney to your next office visit.   Cindy [name] , Thank you for taking time to come for your Medicare Wellness Visit. I appreciate your ongoing commitment to your health goals. Please review the following plan we discussed and let me know if I can assist you in the future.   These are the goals we discussed: Goals    . Continue to exercise.       This is a list of the screening recommended for you and due dates:  Health Maintenance  Topic Date Due  . Complete foot exam   06/26/2015  . Urine Protein Check  01/09/2016  . Eye exam for diabetics  09/15/2016  . Hemoglobin A1C  08/22/2019  . Flu Shot  11/03/2019  . Mammogram  12/07/2019  . Tetanus Vaccine  06/03/2022  . Colon Cancer Screening  04/18/2026  . DEXA scan (bone density measurement)  Completed  . COVID-19 Vaccine  Completed  .  Hepatitis C: One time screening is recommended by Center for Disease Control  (CDC) for  adults born from [birth year] through 1963.   Completed  . Pneumonia vaccines  Completed    Preventive Care 53 Years and Older, Female Preventive care refers to lifestyle choices and visits with your health care provider that can promote health and wellness. This includes:  A yearly physical exam. This is also called an annual well check.  Regular dental and eye exams.  Immunizations.  Screening for certain conditions.  Healthy lifestyle choices, such as diet and exercise. What can I expect for my preventive care visit? Physical exam Your health care provider will check:  Height and  weight. These may be used to calculate body mass index (BMI), which is a measurement that tells if you are at a healthy weight.  Heart rate and blood pressure.  Your skin for abnormal spots. Counseling Your health care provider may ask you questions about:  Alcohol, tobacco, and drug use.  Emotional well-being.  Home and relationship well-being.  Sexual activity.  Eating habits.  History of falls.  Memory and ability to understand (cognition).  Work and work Statistician.  Pregnancy and menstrual history. What immunizations do I need?  Influenza (flu) vaccine  This is recommended every year. Tetanus, diphtheria, and pertussis (Tdap) vaccine  You may need a Td booster every 10 years. Varicella (chickenpox) vaccine  You may need this vaccine if you have not already been vaccinated. Zoster (shingles) vaccine  You may need this after age 58. Pneumococcal conjugate (PCV13) vaccine  One dose is recommended after age 2. Pneumococcal polysaccharide (PPSV23) vaccine  One dose is recommended after age 46. Measles, mumps, and rubella (MMR) vaccine  You may need at least one dose of MMR if you were born in 1957 or later. You may also need a second dose. Meningococcal conjugate (MenACWY) vaccine  You may need this if you have certain conditions. Hepatitis A vaccine  You may need this if you have certain conditions or if you travel or work in places where you may be exposed to hepatitis A. Hepatitis B vaccine  You may  need this if you have certain conditions or if you travel or work in places where you may be exposed to hepatitis B. Haemophilus influenzae type b (Hib) vaccine  You may need this if you have certain conditions. You may receive vaccines as individual doses or as more than one vaccine together in one shot (combination vaccines). Talk with your health care provider about the risks and benefits of combination vaccines. What tests do I need? Blood  tests  Lipid and cholesterol levels. These may be checked every 5 years, or more frequently depending on your overall health.  Hepatitis C test.  Hepatitis B test. Screening  Lung cancer screening. You may have this screening every year starting at age 75 if you have a 30-pack-year history of smoking and currently smoke or have quit within the past 15 years.  Colorectal cancer screening. All adults should have this screening starting at age 5 and continuing until age 89. Your health care provider may recommend screening at age 48 if you are at increased risk. You will have tests every 1-10 years, depending on your results and the type of screening test.  Diabetes screening. This is done by checking your blood sugar (glucose) after you have not eaten for a while (fasting). You may have this done every 1-3 years.  Mammogram. This may be done every 1-2 years. Talk with your health care provider about how often you should have regular mammograms.  BRCA-related cancer screening. This may be done if you have a family history of breast, ovarian, tubal, or peritoneal cancers. Other tests  Sexually transmitted disease (STD) testing.  Bone density scan. This is done to screen for osteoporosis. You may have this done starting at age 11. Follow these instructions at home: Eating and drinking  Eat a diet that includes fresh fruits and vegetables, whole grains, lean protein, and low-fat dairy products. Limit your intake of foods with high amounts of sugar, saturated fats, and salt.  Take vitamin and mineral supplements as recommended by your health care provider.  Do not drink alcohol if your health care provider tells you not to drink.  If you drink alcohol: ? Limit how much you have to 0-1 drink a day. ? Be aware of how much alcohol is in your drink. In the [place] one drink equals one 12 oz bottle of beer (355 mL), one 5 oz glass of wine (148 mL), or one 1 oz glass of hard liquor (44  mL). Lifestyle  Take daily care of your teeth and gums.  Stay active. Exercise for at least 30 minutes on 5 or more days each week.  Do not use any products that contain nicotine or tobacco, such as cigarettes, e-cigarettes, and chewing tobacco. If you need help quitting, ask your health care provider.  If you are sexually active, practice safe sex. Use a condom or other form of protection in order to prevent STIs (sexually transmitted infections).  Talk with your health care provider about taking a low-dose aspirin or statin. What's next?  Go to your health care provider once a year for a well check visit.  Ask your health care provider how often you should have your eyes and teeth checked.  Stay up to date on all vaccines. This information is not intended to replace advice given to you by your health care provider. Make sure you discuss any questions you have with your health care provider. Document Revised: 03/15/2018 Document Reviewed: 03/15/2018 Elsevier Patient Education  2020 [employer].

## 2019-09-30 ENCOUNTER — Other Ambulatory Visit: Payer: Self-pay | Admitting: Family Medicine

## 2019-09-30 DIAGNOSIS — Z1231 Encounter for screening mammogram for malignant neoplasm of breast: Secondary | ICD-10-CM

## 2019-10-10 ENCOUNTER — Other Ambulatory Visit: Payer: Self-pay | Admitting: Family Medicine

## 2019-10-21 DIAGNOSIS — G4733 Obstructive sleep apnea (adult) (pediatric): Secondary | ICD-10-CM | POA: Diagnosis not present

## 2019-11-04 ENCOUNTER — Ambulatory Visit: Payer: Medicare Other | Admitting: Family Medicine

## 2019-11-07 ENCOUNTER — Other Ambulatory Visit: Payer: Self-pay | Admitting: Family Medicine

## 2019-11-07 DIAGNOSIS — F339 Major depressive disorder, recurrent, unspecified: Secondary | ICD-10-CM

## 2019-11-28 ENCOUNTER — Telehealth: Payer: Self-pay | Admitting: Internal Medicine

## 2019-11-28 NOTE — Telephone Encounter (Signed)
Spoke with the pt  She states her so clean machine was recalled and so she wonders if her CPAP was too  I advised that she call CPAP company to find out this info- we can not tell her if it's recalled or not  Pt verbalized understanding  Nothing further needed at this time

## 2019-12-25 ENCOUNTER — Ambulatory Visit
Admission: RE | Admit: 2019-12-25 | Discharge: 2019-12-25 | Disposition: A | Discharge status: Home / Self Care | Payer: Medicare Other | Source: Ambulatory Visit | Attending: Family Medicine | Admitting: Family Medicine

## 2019-12-25 ENCOUNTER — Other Ambulatory Visit: Payer: Self-pay

## 2019-12-25 DIAGNOSIS — Z780 Asymptomatic menopausal state: Secondary | ICD-10-CM | POA: Diagnosis not present

## 2019-12-25 DIAGNOSIS — Z1231 Encounter for screening mammogram for malignant neoplasm of breast: Secondary | ICD-10-CM | POA: Diagnosis not present

## 2019-12-25 DIAGNOSIS — M8589 Other specified disorders of bone density and structure, multiple sites: Secondary | ICD-10-CM | POA: Diagnosis not present

## 2020-01-01 DIAGNOSIS — M62830 Muscle spasm of back: Secondary | ICD-10-CM | POA: Diagnosis not present

## 2020-01-01 DIAGNOSIS — M9901 Segmental and somatic dysfunction of cervical region: Secondary | ICD-10-CM | POA: Diagnosis not present

## 2020-01-01 DIAGNOSIS — M5412 Radiculopathy, cervical region: Secondary | ICD-10-CM | POA: Diagnosis not present

## 2020-01-01 DIAGNOSIS — M9903 Segmental and somatic dysfunction of lumbar region: Secondary | ICD-10-CM | POA: Diagnosis not present

## 2020-01-04 ENCOUNTER — Other Ambulatory Visit: Payer: Self-pay | Admitting: Family Medicine

## 2020-01-27 DIAGNOSIS — R059 Cough, unspecified: Secondary | ICD-10-CM | POA: Diagnosis not present

## 2020-01-27 DIAGNOSIS — Z20822 Contact with and (suspected) exposure to covid-19: Secondary | ICD-10-CM | POA: Diagnosis not present

## 2020-01-29 DIAGNOSIS — Z20822 Contact with and (suspected) exposure to covid-19: Secondary | ICD-10-CM | POA: Diagnosis not present

## 2020-02-10 ENCOUNTER — Other Ambulatory Visit: Payer: Self-pay | Admitting: Family Medicine

## 2020-02-10 DIAGNOSIS — F339 Major depressive disorder, recurrent, unspecified: Secondary | ICD-10-CM

## 2020-02-12 DIAGNOSIS — G4733 Obstructive sleep apnea (adult) (pediatric): Secondary | ICD-10-CM | POA: Diagnosis not present

## 2020-02-21 DIAGNOSIS — H35372 Puckering of macula, left eye: Secondary | ICD-10-CM | POA: Diagnosis not present

## 2020-02-21 DIAGNOSIS — H52223 Regular astigmatism, bilateral: Secondary | ICD-10-CM | POA: Diagnosis not present

## 2020-02-21 DIAGNOSIS — H524 Presbyopia: Secondary | ICD-10-CM | POA: Diagnosis not present

## 2020-02-21 LAB — HM DIABETES EYE EXAM

## 2020-03-09 DIAGNOSIS — Q825 Congenital non-neoplastic nevus: Secondary | ICD-10-CM | POA: Diagnosis not present

## 2020-03-09 DIAGNOSIS — D225 Melanocytic nevi of trunk: Secondary | ICD-10-CM | POA: Diagnosis not present

## 2020-04-08 ENCOUNTER — Other Ambulatory Visit: Payer: Self-pay | Admitting: Family Medicine

## 2020-04-08 DIAGNOSIS — F339 Major depressive disorder, recurrent, unspecified: Secondary | ICD-10-CM

## 2020-04-28 ENCOUNTER — Other Ambulatory Visit: Payer: Self-pay | Admitting: Family Medicine

## 2020-04-28 DIAGNOSIS — F339 Major depressive disorder, recurrent, unspecified: Secondary | ICD-10-CM

## 2020-05-03 ENCOUNTER — Other Ambulatory Visit: Payer: Self-pay | Admitting: Family Medicine

## 2020-05-04 ENCOUNTER — Telehealth: Payer: Self-pay | Admitting: Family Medicine

## 2020-05-04 NOTE — Telephone Encounter (Signed)
LVM letting pt know that Dr. Charlett Blake will continue filling her medication until her appt in March.

## 2020-05-04 NOTE — Telephone Encounter (Signed)
Patient scheduled for appointment for a follow up with PCP on 06/18/2020. Patient would like to ensure that Dr Charlett Blake will continue to refill her medication until she can see her in March, patient was given a 30day refill on medication on 04/28/2020.

## 2020-05-15 ENCOUNTER — Other Ambulatory Visit: Payer: Self-pay | Admitting: Family Medicine

## 2020-05-15 DIAGNOSIS — E7849 Other hyperlipidemia: Secondary | ICD-10-CM

## 2020-05-15 DIAGNOSIS — E119 Type 2 diabetes mellitus without complications: Secondary | ICD-10-CM

## 2020-05-21 DIAGNOSIS — R350 Frequency of micturition: Secondary | ICD-10-CM | POA: Diagnosis not present

## 2020-05-27 ENCOUNTER — Telehealth: Payer: Self-pay | Admitting: Family Medicine

## 2020-05-27 ENCOUNTER — Other Ambulatory Visit: Payer: Self-pay | Admitting: Family Medicine

## 2020-05-27 DIAGNOSIS — F339 Major depressive disorder, recurrent, unspecified: Secondary | ICD-10-CM

## 2020-05-27 NOTE — Telephone Encounter (Signed)
Medication: pravastatin (PRAVACHOL) 80 MG tablet  Has the patient contacted their pharmacy? No. (If no, request that the patient contact the pharmacy for the refill.) (If yes, when and what did the pharmacy advise?)  Preferred Pharmacy (with phone number or street name): [facility] [address]  [address] [place] [address]  Phone:  [phone] Fax:  [phone]  DEA #:  --  Agent: Please be advised that RX refills may take up to 3 business days. We ask that you follow-up with your pharmacy.

## 2020-05-28 MED ORDER — PRAVASTATIN SODIUM 80 MG PO TABS
80.0000 mg | ORAL_TABLET | Freq: Every day | ORAL | 0 refills | Status: DC
Start: 2020-05-28 — End: 2020-11-26

## 2020-05-28 NOTE — Telephone Encounter (Signed)
Patient notified that rx has been sent in. 

## 2020-06-01 ENCOUNTER — Telehealth: Payer: Self-pay

## 2020-06-01 NOTE — Telephone Encounter (Signed)
LVM to call back to discuss if she needed duloxetine, ezetimibe, pravastatin, and metformin sent into [facility].

## 2020-06-05 ENCOUNTER — Telehealth: Payer: Self-pay | Admitting: *Deleted

## 2020-06-05 NOTE — Telephone Encounter (Signed)
Left message on machine to call back  Patient needs appointment for further refills.  Last visit 05/2019.

## 2020-06-15 DIAGNOSIS — G4733 Obstructive sleep apnea (adult) (pediatric): Secondary | ICD-10-CM | POA: Diagnosis not present

## 2020-06-18 ENCOUNTER — Other Ambulatory Visit: Payer: Self-pay

## 2020-06-18 ENCOUNTER — Telehealth (INDEPENDENT_AMBULATORY_CARE_PROVIDER_SITE_OTHER): Payer: Medicare Other | Admitting: Family Medicine

## 2020-06-18 VITALS — Wt 168.0 lb

## 2020-06-18 DIAGNOSIS — N390 Urinary tract infection, site not specified: Secondary | ICD-10-CM

## 2020-06-18 DIAGNOSIS — E6609 Other obesity due to excess calories: Secondary | ICD-10-CM

## 2020-06-18 DIAGNOSIS — G3184 Mild cognitive impairment, so stated: Secondary | ICD-10-CM | POA: Diagnosis not present

## 2020-06-18 DIAGNOSIS — E785 Hyperlipidemia, unspecified: Secondary | ICD-10-CM | POA: Diagnosis not present

## 2020-06-18 DIAGNOSIS — E559 Vitamin D deficiency, unspecified: Secondary | ICD-10-CM | POA: Diagnosis not present

## 2020-06-18 DIAGNOSIS — E1169 Type 2 diabetes mellitus with other specified complication: Secondary | ICD-10-CM | POA: Diagnosis not present

## 2020-06-18 DIAGNOSIS — G4733 Obstructive sleep apnea (adult) (pediatric): Secondary | ICD-10-CM | POA: Diagnosis not present

## 2020-06-18 DIAGNOSIS — R945 Abnormal results of liver function studies: Secondary | ICD-10-CM

## 2020-06-18 NOTE — Progress Notes (Signed)
MyChart phoneVisit    Virtual Visit via phone Note   This visit type was conducted due to national recommendations for restrictions regarding the COVID-19 Pandemic (e.g. social distancing) in an effort to limit this patient's exposure and mitigate transmission in our community. This patient is at least at moderate risk for complications without adequate follow up. This format is felt to be most appropriate for this patient at this time. Physical exam was limited by quality of the audio technology used for the visit. CMA was able to get the patient set up on a phone visit.  Patient location: Home, patient and provider in visit Provider location: Office  I discussed the limitations of evaluation and management by telemedicine and the availability of in person appointments. The patient expressed understanding and agreed to proceed.  Visit Date :06/18/2020  Today's healthcare provider: Penni Homans, MD     Subjective:    Patient ID: Cindy [name], female    DOB: 31-Jul-1945, 75 y.o.   MRN: [id number]  Chief Complaint  Patient presents with  . Medication Refill    HPI Patient is in today for follow up on chronic medical concerns, no recent febrile illness or hospitalizations. She has been maintaining quarantine well. Has been staying active and maintaining a heart healthy diet. s no acute concerns. Denies CP/palp/SOB/HA/congestion/fevers/GI or GU c/o. Taking meds as prescribed  Past Medical History:  Diagnosis Date  . Abnormal liver function tests 07/19/2015  . Allergic state 02/01/2010   Qualifier: Diagnosis of  By: Regis Bill MD, Standley Brooking   . Allergy   . Anxiety   . Back pain   . Chicken pox 75 yrs old  . Depressive disorder, not elsewhere classified   . Diabetes ([place])    diet controlled- no medicines   . Fibroid   . History of fractured kneecap    right   . Hyperglycemia   . Hyperlipidemia   . Infertility, female   . Joint pain   . Knee pain, left 10/05/2011   Follows  with Dr Margarette Canada  . Measles as a child  . Medicare annual wellness visit, subsequent 07/06/2014  . OA (osteoarthritis) of knee    left knee  . Obesity 10/05/2011  . Obesity 10/05/2011  . Osteoarthritis   . Osteopenia 06/26/2014  . Other and unspecified hyperlipidemia   . Overweight 10/05/2011  . Overweight(278.02) 10/05/2011  . Sleep apnea     On CPAP  . Sleep apnea   . Stye 11/19/2012  . Syncope 01/26/2017  . Thyroid disease    h/o hyperthyroid treated with radioactive iodine? from 16 to 20  . Type 2 diabetes mellitus with hyperlipidemia ([place])   . Varicose vein of leg     Past Surgical History:  Procedure Laterality Date  . BACK SURGERY     L5 discectomy, 2002. helpful  . CERVICAL BIOPSY  W/ LOOP ELECTRODE EXCISION    . CESAREAN SECTION  1987  . COLONOSCOPY    . CPAP    . KNEE SURGERY Right 2006   R knee fx. patella  . LEEP    . stitches in foot Right     Family History  Problem Relation Age of Onset  . Hyperlipidemia Mother   . Dementia Mother   . Obesity Mother   . Hyperlipidemia Father   . Heart disease Father        bypass, CAD  . Sleep apnea Father   . Hyperlipidemia Sister   . Diabetes Sister  type 2  . Colon polyps Sister   . Dementia Maternal Grandmother        alzheimer  . Alcohol abuse Maternal Grandfather   . Diabetes Paternal Grandmother        type 2?  . Colon cancer Neg Hx   . Esophageal cancer Neg Hx   . Rectal cancer Neg Hx   . Stomach cancer Neg Hx     Social History   Socioeconomic History  . Marital status: Divorced    Spouse name: Not on file  . Number of children: 1  . Years of education: Not on file  . Highest education level: Not on file  Occupational History  . 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 Smoking status: Never Smoker  . Smokeless tobacco: Never Used  Substance and Sexual Activity  . Alcohol use: Yes    Alcohol/week: 1.0 standard drink    Types:  1 Glasses of wine per week  . Drug use: No  . Sexual activity: Never    Partners: Male    Birth control/protection: Post-menopausal  Other Topics Concern  . Not on file  Social History Narrative   Recently divorced separated fall 2012    Now living in [place] area .   Never smoker   Regular exercise   Sleep 01/12/29 up at 8         Social Determinants of Health   Financial Resource Strain: Low Risk   . Difficulty of Paying Living Expenses: Not hard at all  Food Insecurity: No Food Insecurity  . Worried About Charity fundraiser in the Last Year: Never true  . Ran Out of Food in the Last Year: Never true  Transportation Needs: No Transportation Needs  . Lack of Transportation (Medical): No  . Lack of Transportation (Non-Medical): No  Physical Activity: Not on file  Stress: Not on file  Social Connections: Not on file  Intimate Partner Violence: Not on file    Outpatient Medications Prior to Visit  Medication Sig Dispense Refill  . aspirin 81 MG tablet Take 81 mg by mouth daily.    . cholecalciferol (VITAMIN D) 1000 units tablet Take 2,000 Units by mouth daily.     . DULoxetine (CYMBALTA) 30 MG capsule TAKE ONE CAPSULE BY MOUTH DAILY *NEEDS OFFICE VISIT FOR FURTHER REFILLS* 30 capsule 0  . ezetimibe (ZETIA) 10 MG tablet Take 1 tablet (10 mg total) by mouth daily. 90 tablet 1  . fluticasone (FLONASE) 50 MCG/ACT nasal spray Place 1 spray into both nostrils as needed.     Marland Kitchen levocetirizine (XYZAL) 5 MG tablet Take 5 mg by mouth as needed.   3  . metFORMIN (GLUCOPHAGE) 500 MG tablet Take 1 tablet (500 mg total) by mouth 2 (two) times daily with a meal. 180 tablet 1  . pravastatin (PRAVACHOL) 80 MG tablet Take 1 tablet (80 mg total) by mouth daily. 90 tablet 0  . Turmeric 500 MG CAPS Take by mouth.     No facility-administered medications prior to visit.    Allergies  Allergen Reactions  . Codeine Other (See Comments)    hallucinations  . Crestor [Rosuvastatin]      REACTION: myalgias  . Other Other (See Comments)    NOVACAINE- blisters left side of mouth after receiving   . Amoxicillin Rash  . Erythromycin Other (See Comments)    cramps  . Sulfa Antibiotics Rash  .  Sulfonamide Derivatives Rash    Review of Systems  Constitutional: Negative for fever and malaise/fatigue.  HENT: Negative for congestion, ear pain and sore throat.   Eyes: Negative for blurred vision and pain.  Respiratory: Negative for shortness of breath.   Cardiovascular: Negative for chest pain, palpitations and leg swelling.  Gastrointestinal: Negative for abdominal pain, blood in stool and nausea.  Genitourinary: Negative for dysuria and frequency.  Musculoskeletal: Negative for back pain and neck pain.  Skin: Negative for rash.  Neurological: Negative for dizziness and headaches.       Objective:    Physical Exam Constitutional:      Appearance: Normal appearance.  HENT:     Head: Normocephalic and atraumatic.     Right Ear: External ear normal.     Left Ear: External ear normal.  Eyes:     Pupils: Pupils are equal, round, and reactive to light.  Pulmonary:     Effort: Pulmonary effort is normal.  Musculoskeletal:     Cervical back: Normal range of motion.  Neurological:     Mental Status: She is alert and oriented to person, place, and time.  Psychiatric:        Behavior: Behavior normal.     Wt 168 lb (76.2 kg)   LMP 04/04/2000   BMI 28.39 kg/m  Wt Readings from Last 3 Encounters:  06/18/20 168 lb (76.2 kg)  09/19/19 155 lb (70.3 kg)  05/07/19 161 lb 3.2 oz (73.1 kg)    Diabetic Foot Exam - Simple   No data filed    Lab Results  Component Value Date   WBC 6.3 06/19/2020   HGB 13.1 06/19/2020   HCT 38.0 06/19/2020   PLT 304.0 06/19/2020   GLUCOSE 91 06/19/2020   CHOL 163 06/19/2020   TRIG 119.0 06/19/2020   HDL 55.60 06/19/2020   LDLDIRECT 145.4 11/14/2012   LDLCALC 83 06/19/2020   ALT 20 06/19/2020   AST 23 06/19/2020   NA 139  06/19/2020   K 4.1 06/19/2020   CL 103 06/19/2020   CREATININE 0.75 06/19/2020   BUN 12 06/19/2020   CO2 27 06/19/2020   TSH 2.00 06/19/2020   HGBA1C 5.9 06/19/2020   MICROALBUR <0.7 01/09/2015    Lab Results  Component Value Date   TSH 2.00 06/19/2020   Lab Results  Component Value Date   WBC 6.3 06/19/2020   HGB 13.1 06/19/2020   HCT 38.0 06/19/2020   MCV 89.8 06/19/2020   PLT 304.0 06/19/2020   Lab Results  Component Value Date   NA 139 06/19/2020   K 4.1 06/19/2020   CO2 27 06/19/2020   GLUCOSE 91 06/19/2020   BUN 12 06/19/2020   CREATININE 0.75 06/19/2020   BILITOT 0.8 06/19/2020   ALKPHOS 71 06/19/2020   AST 23 06/19/2020   ALT 20 06/19/2020   PROT 7.0 06/19/2020   ALBUMIN 4.0 06/19/2020   CALCIUM 10.0 06/19/2020   GFR 79.25 06/19/2020   Lab Results  Component Value Date   CHOL 163 06/19/2020   Lab Results  Component Value Date   HDL 55.60 06/19/2020   Lab Results  Component Value Date   LDLCALC 83 06/19/2020   Lab Results  Component Value Date   TRIG 119.0 06/19/2020   Lab Results  Component Value Date   CHOLHDL 3 06/19/2020   Lab Results  Component Value Date   HGBA1C 5.9 06/19/2020       Assessment & Plan:   Problem List Items Addressed  This Visit    Hyperlipidemia    Encouraged heart healthy diet, increase exercise, avoid trans fats, consider a krill oil cap daily      Relevant Orders   Lipid panel (Completed)   TSH (Completed)   Obstructive sleep apnea    Using her CPAP machine regularly      Obesity   Type 2 diabetes mellitus with hyperlipidemia (HCC)    hgba1c acceptable, minimize simple carbs. Increase exercise as tolerated.       Relevant Orders   TSH (Completed)   Hemoglobin A1c (Completed)   Abnormal liver function tests    Minimize simple carbohydrates and monitor      Vitamin D deficiency    Supplement and monitor       Other Visit Diagnoses    Recurrent UTI    -  Primary   Relevant Orders   CBC  (Completed)   Comprehensive metabolic panel (Completed)   Urinalysis   Urine Culture (Completed)   Mild cognitive impairment       Relevant Orders   Ambulatory referral to Neuropsychology       No orders of the defined types were placed in this encounter.   I discussed the assessment and treatment plan with the patient. The patient was provided an opportunity to ask questions and all were answered. The patient agreed with the plan and demonstrated an understanding of the instructions.   The patient was advised to call back or seek an in-person evaluation if the symptoms worsen or if the condition fails to improve as anticipated.  I provided 25 minutes of non-face-to-face time during this encounter.   Penni Homans, MD [facility] [phone] (phone) [phone] (fax)  [place]

## 2020-06-19 ENCOUNTER — Encounter: Payer: Self-pay | Admitting: Family Medicine

## 2020-06-19 ENCOUNTER — Other Ambulatory Visit (INDEPENDENT_AMBULATORY_CARE_PROVIDER_SITE_OTHER): Payer: Medicare Other

## 2020-06-19 DIAGNOSIS — N390 Urinary tract infection, site not specified: Secondary | ICD-10-CM | POA: Diagnosis not present

## 2020-06-19 DIAGNOSIS — E785 Hyperlipidemia, unspecified: Secondary | ICD-10-CM

## 2020-06-19 DIAGNOSIS — E1169 Type 2 diabetes mellitus with other specified complication: Secondary | ICD-10-CM | POA: Diagnosis not present

## 2020-06-19 LAB — URINALYSIS, ROUTINE W REFLEX MICROSCOPIC
Bilirubin Urine: NEGATIVE
Ketones, ur: NEGATIVE
Nitrite: NEGATIVE
RBC / HPF: NONE SEEN (ref 0–?)
Specific Gravity, Urine: 1.015 (ref 1.000–1.030)
Total Protein, Urine: NEGATIVE
Urine Glucose: NEGATIVE
Urobilinogen, UA: 0.2 (ref 0.0–1.0)
pH: 6 (ref 5.0–8.0)

## 2020-06-19 LAB — CBC
HCT: 38 % (ref 36.0–46.0)
Hemoglobin: 13.1 g/dL (ref 12.0–15.0)
MCHC: 34.3 g/dL (ref 30.0–36.0)
MCV: 89.8 fl (ref 78.0–100.0)
Platelets: 304 10*3/uL (ref 150.0–400.0)
RBC: 4.23 Mil/uL (ref 3.87–5.11)
RDW: 14.4 % (ref 11.5–15.5)
WBC: 6.3 10*3/uL (ref 4.0–10.5)

## 2020-06-19 LAB — COMPREHENSIVE METABOLIC PANEL
ALT: 20 U/L (ref 0–35)
AST: 23 U/L (ref 0–37)
Albumin: 4 g/dL (ref 3.5–5.2)
Alkaline Phosphatase: 71 U/L (ref 39–117)
BUN: 12 mg/dL (ref 6–23)
CO2: 27 mEq/L (ref 19–32)
Calcium: 10 mg/dL (ref 8.4–10.5)
Chloride: 103 mEq/L (ref 96–112)
Creatinine, Ser: 0.75 mg/dL (ref 0.40–1.20)
GFR: 79.25 mL/min (ref >60.00)
Glucose, Bld: 91 mg/dL (ref 70–99)
Potassium: 4.1 mEq/L (ref 3.5–5.1)
Sodium: 139 mEq/L (ref 135–145)
Total Bilirubin: 0.8 mg/dL (ref 0.2–1.2)
Total Protein: 7 g/dL (ref 6.0–8.3)

## 2020-06-19 LAB — LIPID PANEL
Cholesterol: 163 mg/dL (ref 0–200)
HDL: 55.6 mg/dL (ref >39.00)
LDL Cholesterol: 83 mg/dL (ref 0–99)
NonHDL: 106.92
Total CHOL/HDL Ratio: 3
Triglycerides: 119 mg/dL (ref 0.0–149.0)
VLDL: 23.8 mg/dL (ref 0.0–40.0)

## 2020-06-19 LAB — TSH: TSH: 2 u[IU]/mL (ref 0.35–4.50)

## 2020-06-19 LAB — HEMOGLOBIN A1C: Hgb A1c MFr Bld: 5.9 % (ref 4.6–6.5)

## 2020-06-20 LAB — URINE CULTURE
MICRO NUMBER:: 11664954
Result:: NO GROWTH
SPECIMEN QUALITY:: ADEQUATE

## 2020-06-21 NOTE — Assessment & Plan Note (Signed)
Using her CPAP machine regularly

## 2020-06-21 NOTE — Assessment & Plan Note (Signed)
Minimize simple carbohydrates and monitor

## 2020-06-21 NOTE — Assessment & Plan Note (Signed)
Supplement and monitor 

## 2020-06-21 NOTE — Assessment & Plan Note (Signed)
hgba1c acceptable, minimize simple carbs. Increase exercise as tolerated.  

## 2020-06-21 NOTE — Assessment & Plan Note (Signed)
Encouraged heart healthy diet, increase exercise, avoid trans fats, consider a krill oil cap daily 

## 2020-06-26 ENCOUNTER — Other Ambulatory Visit: Payer: Self-pay | Admitting: Family Medicine

## 2020-06-26 ENCOUNTER — Encounter: Payer: Self-pay | Admitting: Counselor

## 2020-06-26 DIAGNOSIS — F339 Major depressive disorder, recurrent, unspecified: Secondary | ICD-10-CM

## 2020-08-04 ENCOUNTER — Encounter: Payer: Self-pay | Admitting: Counselor

## 2020-09-18 ENCOUNTER — Telehealth: Payer: Self-pay | Admitting: Family Medicine

## 2020-09-18 NOTE — Telephone Encounter (Signed)
Copied from [place] [id number]. Topic: Medicare AWV >> Sep 18, 2020 11:19 AM Harris-Coley, Hannah Beat wrote: Reason for CRM: Left message for patient to schedule Annual Wellness Visit.  Please schedule with Health Nurse Advisor Augustine Radar. at [facility].

## 2020-09-28 ENCOUNTER — Ambulatory Visit (INDEPENDENT_AMBULATORY_CARE_PROVIDER_SITE_OTHER): Payer: Medicare Other | Admitting: Counselor

## 2020-09-28 ENCOUNTER — Encounter: Payer: Self-pay | Admitting: Counselor

## 2020-09-28 ENCOUNTER — Other Ambulatory Visit: Payer: Self-pay

## 2020-09-28 ENCOUNTER — Ambulatory Visit: Payer: Medicare Other | Admitting: Psychology

## 2020-09-28 DIAGNOSIS — G3184 Mild cognitive impairment, so stated: Secondary | ICD-10-CM

## 2020-09-28 DIAGNOSIS — F09 Unspecified mental disorder due to known physiological condition: Secondary | ICD-10-CM

## 2020-09-28 DIAGNOSIS — F32A Depression, unspecified: Secondary | ICD-10-CM
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
## 2020-09-28 NOTE — Progress Notes (Signed)
   Psychometrist Note   Cognitive testing was administered to Cindy [name] by Milana Kidney, B.S. (Technician) under the supervision of Alphonzo Severance, Psy.D., ABN. [name] was able to tolerate all test procedures. Dr. Nicole Kindred met with the patient as needed to manage any emotional reactions to the testing procedures. Rest breaks were offered.    The battery of tests administered was selected by Dr. Nicole Kindred with consideration to the patient's current level of functioning, the nature of her symptoms, emotional and behavioral responses during the interview, level of literacy, observed level of motivation/effort, and the nature of the referral question. This battery was communicated to the psychometrist. Communication between Dr. Nicole Kindred and the psychometrist was ongoing throughout the evaluation and Dr. Nicole Kindred was immediately accessible at all times. Dr. Nicole Kindred provided supervision to the technician on the date of this service, to the extent necessary to assure the quality of all services provided.    [name] will return in approximately one week for an interactive feedback session with Dr. Nicole Kindred, at which time test performance, clinical impressions, and treatment recommendations will be reviewed in detail. The patient understands she can contact our office should she require our assistance before this time.   A total of 110 minutes of billable time were spent with Cindy [name] by the technician, including test administration and scoring time. Billing for these services is reflected in Dr. Les Pou note.   This note reflects time spent with the psychometrician and does not include test scores, clinical history, or any interpretations made by Dr. Nicole Kindred. The full report will follow in a separate note.

## 2020-09-29 ENCOUNTER — Ambulatory Visit (INDEPENDENT_AMBULATORY_CARE_PROVIDER_SITE_OTHER): Payer: Medicare Other

## 2020-09-29 VITALS — Ht 64.5 in | Wt 168.0 lb

## 2020-09-29 DIAGNOSIS — Z0000 Encounter for general adult medical examination without abnormal findings: Secondary | ICD-10-CM | POA: Diagnosis not present

## 2020-09-29 NOTE — Progress Notes (Signed)
Subjective:   Cindy [name] is a 75 y.o. female who presents for Medicare Annual (Subsequent) preventive examination.  I connected with Adeena today by telephone and verified that I am speaking with the correct person using two identifiers. Location patient: home Location provider: work Persons participating in the virtual visit: patient, Marine scientist.    I discussed the limitations, risks, security and privacy concerns of performing an evaluation and management service by telephone and the availability of in person appointments. I also discussed with the patient that there may be a patient responsible charge related to this service. The patient expressed understanding and verbally consented to this telephonic visit.    Interactive audio and video telecommunications were attempted between this provider and patient, however failed, due to patient having technical difficulties OR patient did not have access to video capability.  We continued and completed visit with audio only.  Some vital signs may be absent or patient reported.   Time Spent with patient on telephone encounter: 20 minutes   Review of Systems     Cardiac Risk Factors include: advanced age (>82men, >58 women);diabetes mellitus;dyslipidemia     Objective:    Today's Vitals   09/29/20 1058  Weight: 168 lb (76.2 kg)  Height: 5' 4.5" (1.638 m)   Body mass index is 28.39 kg/m.  Advanced Directives 09/29/2020 09/19/2019 10/04/2016 02/16/2016 12/04/2014 11/27/2014 06/26/2014  Does Patient Have a Medical Advance Directive? No No No No No No No  Would patient like information on creating a medical advance directive? Yes (MAU/Ambulatory/Procedural Areas - Information given) No - Patient declined - - No - patient declined information - Yes - Educational materials given    Current Medications (verified) Outpatient Encounter Medications as of 09/29/2020  Medication Sig   aspirin 81 MG tablet Take 81 mg by mouth daily.   cholecalciferol  (VITAMIN D) 1000 units tablet Take 2,000 Units by mouth daily.    DULoxetine (CYMBALTA) 30 MG capsule TAKE ONE CAPSULE BY MOUTH DAILY *NEEDS OFFICE VISIT FOR FURTHER REFILLS*   ezetimibe (ZETIA) 10 MG tablet Take 1 tablet (10 mg total) by mouth daily.   fluticasone (FLONASE) 50 MCG/ACT nasal spray Place 1 spray into both nostrils as needed.    levocetirizine (XYZAL) 5 MG tablet Take 5 mg by mouth as needed.    metFORMIN (GLUCOPHAGE) 500 MG tablet Take 1 tablet (500 mg total) by mouth 2 (two) times daily with a meal.   pravastatin (PRAVACHOL) 80 MG tablet Take 1 tablet (80 mg total) by mouth daily.   Turmeric 500 MG CAPS Take by mouth.   No facility-administered encounter medications on file as of 09/29/2020.    Allergies (verified) Codeine, Crestor [rosuvastatin], Other, Amoxicillin, Erythromycin, Sulfa antibiotics, and Sulfonamide derivatives   History: Past Medical History:  Diagnosis Date   Abnormal liver function tests 07/19/2015   Allergic state 02/01/2010   Qualifier: Diagnosis of  By: Regis Bill MD, Standley Brooking    Allergy    Anxiety    Back pain    Chicken pox 75 yrs old   Depressive disorder, not elsewhere classified    Diabetes ([place])    diet controlled- no medicines    Fibroid    History of fractured kneecap    right    Hyperglycemia    Hyperlipidemia    Infertility, female    Joint pain    Knee pain, left 10/05/2011   Follows with Dr Margarette Canada   Measles as a child   Medicare annual wellness visit,  subsequent 07/06/2014   OA (osteoarthritis) of knee    left knee   Obesity 10/05/2011   Obesity 10/05/2011   Osteoarthritis    Osteopenia 06/26/2014   Other and unspecified hyperlipidemia    Overweight 10/05/2011   Overweight(278.02) 10/05/2011   Sleep apnea     On CPAP   Sleep apnea    Stye 11/19/2012   Syncope 01/26/2017   Thyroid disease    h/o hyperthyroid treated with radioactive iodine? from 16 to 20   Type 2 diabetes mellitus with hyperlipidemia ([place])    Varicose vein of leg     Past Surgical History:  Procedure Laterality Date   BACK SURGERY     L5 discectomy, 2002. helpful   CERVICAL BIOPSY  W/ LOOP ELECTRODE EXCISION     CESAREAN SECTION  1987   COLONOSCOPY     CPAP     KNEE SURGERY Right 2006   R knee fx. patella   LEEP     stitches in foot Right    Family History  Problem Relation Age of Onset   Hyperlipidemia Mother    Dementia Mother    Obesity Mother    Hyperlipidemia Father    Heart disease Father        bypass, CAD   Sleep apnea Father    Hyperlipidemia Sister    Diabetes Sister        type 2   Colon polyps Sister    Dementia Maternal Grandmother        alzheimer   Alcohol abuse Maternal Grandfather    Diabetes Paternal Grandmother        type 2?   Colon cancer Neg Hx    Esophageal cancer Neg Hx    Rectal cancer Neg Hx    Stomach cancer Neg Hx    Social History   Socioeconomic History   Marital status: Divorced    Spouse name: Not on file   Number of children: 1   Years of education: Not on file   Highest education level: Not on file  Occupational History   Occupation: licensed Clinical Social worker    Employer: [employer]  Tobacco Use   Smoking status: Never   Smokeless tobacco: Never  Substance and Sexual Activity   Alcohol use: Yes    Alcohol/week: 1.0 standard drink    Types: 1 Glasses of wine per week   Drug use: No   Sexual activity: Never    Partners: Male    Birth control/protection: Post-menopausal  Other Topics Concern   Not on file  Social History Narrative   Recently divorced separated fall 2012    Now living in [place] area .   Never smoker   Regular exercise   Sleep 01/12/29 up at 6         Social Determinants of Health   Financial Resource Strain: Low Risk    Difficulty of Paying Living Expenses: Not hard at all  Food Insecurity: No Food Insecurity   Worried About Charity fundraiser in the Last Year: Never true   Arboriculturist in the Last Year: Never true   Transportation Needs: No Transportation Needs   Lack of Transportation (Medical): No   Lack of Transportation (Non-Medical): No  Physical Activity: Sufficiently Active   Days of Exercise per Week: 5 days   Minutes of Exercise per Session: 40 min  Stress: No Stress Concern Present   Feeling of Stress : Not at all  Social Connections: Moderately Integrated   Frequency of Communication with Friends and Family: More than three times a week   Frequency of Social Gatherings with Friends and Family: More than three times a week   Attends Religious Services: 1 to 4 times per year   Active Member of [employer] or Organizations: Yes   Attends Archivist Meetings: 1 to 4 times per year   Marital Status: Divorced    Tobacco Counseling Counseling given: Not Answered   Clinical Intake:  Pre-visit preparation completed: Yes  Pain : No/denies pain     Nutritional Status: BMI 25 -29 Overweight Nutritional Risks: None Diabetes: Yes CBG done?: No Did pt. bring in CBG monitor from home?: No (phone visit)  How often do you need to have someone help you when you read instructions, pamphlets, or other written materials from your doctor or pharmacy?: 1 - Never Diabetes:  Is the patient diabetic?  Yes  If diabetic, was a CBG obtained today?  No  Did the patient bring in their glucometer from home?  No phone visit How often do you monitor your CBG's? never.   Financial Strains and Diabetes Management:  Are you having any financial strains with the device, your supplies or your medication? No .  Does the patient want to be seen by Chronic Care Management for management of their diabetes?  No  Would the patient like to be referred to a Nutritionist or for Diabetic Management?  No   Diabetic Exams:  Diabetic Eye Exam: Completed 02/21/2020. O  Diabetic Foot Exam:  Pt has been advised about the importance in completing this exam.  To be completed by PCP.   Interpreter Needed?:  No  Information entered by :: Caroleen Hamman LPN   Activities of Daily Living In your present state of health, do you have any difficulty performing the following activities: 09/29/2020  Hearing? Y  Comment hearing aids  Vision? N  Difficulty concentrating or making decisions? N  Walking or climbing stairs? N  Dressing or bathing? N  Doing errands, shopping? N  Preparing Food and eating ? N  Using the Toilet? N  In the past six months, have you accidently leaked urine? N  Do you have problems with loss of bowel control? N  Managing your Medications? N  Managing your Finances? N  Housekeeping or managing your Housekeeping? N  Some recent data might be hidden    Patient Care Team: Mosie Lukes, MD as PCP - General (Family Medicine) Regina Eck, CNM as Referring Physician (Certified Nurse Midwife) Deneise Lever, MD as Consulting Physician (Pulmonary Disease) Erlene Senters as Referring Physician (Chiropractic Medicine) Marica Otter, [place] (Optometry) Crista Luria, MD as Consulting Physician (Dermatology) Elam Dutch, MD as Consulting Physician (Vascular Surgery) Monna Fam, MD as Consulting Physician (Ophthalmology)  Indicate any recent Medical Services you may have received from other than [facility] providers in the past year (date may be approximate).     Assessment:   This is a routine wellness examination for Richanda.  Hearing/Vision screen Hearing Screening - Comments:: Bilateral hearing aids Vision Screening - Comments:: Wears glasses Last eye exam-02/2020-Dr. Sabra Heck  Dietary issues and exercise activities discussed: Current Exercise Habits: Home exercise routine, Type of exercise: walking;strength training/weights (biking), Time (Minutes): 40, Frequency (Times/Week): 5, Weekly Exercise (Minutes/Week): 200, Intensity: Mild, Exercise limited by: None identified   Goals Addressed             This Visit's Progress  Continue to exercise.    On track    Patient Stated       Lose some weight & keep it off        Depression Screen PHQ 2/9 Scores 09/29/2020 09/19/2019 07/31/2017 01/10/2017 07/25/2016 02/11/2016 12/04/2014  PHQ - 2 Score 0 0 0 3 0 0 0  PHQ- 9 Score - - - 9 - - -  Exception Documentation - - - - - - Other- indicate reason in comment box    Fall Risk Fall Risk  09/29/2020 09/19/2019 07/31/2017 07/25/2016 02/11/2016  Falls in the past year? 1 1 Yes No No  Comment - tripped over dog - - -  Number falls in past yr: 0 0 1 - -  Injury with Fall? 0 0 - - -  Risk for fall due to : - - - - -  Risk for fall due to: Comment - - - - -  Follow up Falls prevention discussed Education provided;Falls prevention discussed - - -    FALL RISK PREVENTION PERTAINING TO THE HOME:  Any stairs in or around the home? Yes  If so, are there any without handrails? No  Home free of loose throw rugs in walkways, pet beds, electrical cords, etc? Yes  Adequate lighting in your home to reduce risk of falls? Yes   ASSISTIVE DEVICES UTILIZED TO PREVENT FALLS:  Life alert? No  Use of a cane, walker or w/c? No  Grab bars in the bathroom? Yes  Shower chair or bench in shower? No  Elevated toilet seat or a handicapped toilet? No   TIMED UP AND GO:  Was the test performed? No . Phone visit  Cognitive Function:Normal cognitive status assessed by  this Nurse Health Advisor. No abnormalities found.     Montreal Cognitive Assessment  09/28/2020  Visuospatial/ Executive (0/5) 5  Naming (0/3) 3  Attention: Read list of digits (0/2) 2  Attention: Read list of letters (0/1) 1  Attention: Serial 7 subtraction starting at 100 (0/3) 3  Language: Repeat phrase (0/2) 2  Language : Fluency (0/1) 1  Abstraction (0/2) 2  Delayed Recall (0/5) 5  Orientation (0/6) 6  Total 30  Adjusted Score (based on education) 30      Immunizations Immunization History  Administered Date(s) Administered   Influenza Split 06/02/2012, 12/30/2014   Influenza Whole  03/19/2007   Influenza, High Dose Seasonal PF 01/26/2017   Influenza,inj,Quad PF,6+ Mos 01/02/2014, 12/08/2015   Influenza-Unspecified 12/03/2012, 01/02/2020   PFIZER(Purple Top)SARS-COV-2 Vaccination 04/24/2019, 05/13/2019, 01/02/2020, 07/31/2020   Pneumococcal Conjugate-13 01/09/2015   Pneumococcal Polysaccharide-23 07/25/2016   Td 11/08/2005   Tdap 06/02/2012   Zoster Recombinat (Shingrix) 11/26/2017   Zoster, Live 10/05/2011    TDAP status: Up to date  Flu vaccine status: Due 12/2020  Pneumococcal vaccine status: Up to date  Covid-19 vaccine status: Completed vaccines  Qualifies for Shingles Vaccine? No   Zostavax completed Yes   Shingrix Completed?: Yes  Screening Tests Health Maintenance  Topic Date Due   FOOT EXAM  06/26/2015   URINE MICROALBUMIN  01/09/2016   Zoster Vaccines- Shingrix (2 of 2) 01/21/2018   INFLUENZA VACCINE  11/02/2020   HEMOGLOBIN A1C  12/20/2020   MAMMOGRAM  12/24/2020   OPHTHALMOLOGY EXAM  02/20/2021   TETANUS/TDAP  06/03/2022   COLONOSCOPY (Pts 45-3yrs Insurance coverage will need to be confirmed)  04/18/2026   DEXA SCAN  Completed   COVID-19 Vaccine  Completed   Hepatitis C Screening  Completed  PNA vac Low Risk Adult  Completed   HPV VACCINES  Aged Out    Health Maintenance  Health Maintenance Due  Topic Date Due   FOOT EXAM  06/26/2015   URINE MICROALBUMIN  01/09/2016   Zoster Vaccines- Shingrix (2 of 2) 01/21/2018    Colorectal cancer screening: Type of screening: Colonoscopy. Completed 04/18/2016. Repeat every 10 years  Mammogram status: Completed 12/25/2019-Bilateral. Repeat every year  Bone Density status: Completed 12/25/2019. Results reflect: Bone density results: OSTEOPENIA. Repeat every 2 years.  Lung Cancer Screening: (Low Dose CT Chest recommended if Age 13-80 years, 30 pack-year currently smoking OR have quit w/in 15years.) does not qualify.     Additional Screening:  Hepatitis C Screening:Completed  07/06/2015  Vision Screening: Recommended annual ophthalmology exams for early detection of glaucoma and other disorders of the eye. Is the patient up to date with their annual eye exam?  Yes  Who is the provider or what is the name of the office in which the patient attends annual eye exams? Dr. Sabra Heck   Dental Screening: Recommended annual dental exams for proper oral hygiene  Community Resource Referral / Chronic Care Management: CRR required this visit?  No   CCM required this visit?  No      Plan:     I have personally reviewed and noted the following in the patient's chart:   Medical and social history Use of alcohol, tobacco or illicit drugs  Current medications and supplements including opioid prescriptions.  Functional ability and status Nutritional status Physical activity Advanced directives List of other physicians Hospitalizations, surgeries, and ER visits in previous 12 months Vitals Screenings to include cognitive, depression, and falls Referrals and appointments  In addition, I have reviewed and discussed with patient certain preventive protocols, quality metrics, and best practice recommendations. A written personalized care plan for preventive services as well as general preventive health recommendations were provided to patient.   Due to this being a telephonic visit, the after visit summary with patients personalized plan was offered to patient via mail or my-chart. Patient would like to access on my-chart.   Marta Antu, LPN   [id number]/[id number]  Nurse Health Advisor  Nurse Notes: None

## 2020-09-29 NOTE — Progress Notes (Signed)
[facility]  Patient Name: Cindy [name] MRN: [id number] Date of Birth: 03/26/1946 Age: 75 y.o. Education: 18 years  Measurement properties of test scores: IQ, Index, and Standard Scores (SS): Mean = 100; Standard Deviation = 15 Scaled Scores (Ss): Mean = 10; Standard Deviation = 3 Z scores (Z): Mean = 0; Standard Deviation = 1 T scores (T); Mean = 50; Standard Deviation = 10  TEST SCORES:    Note: This summary of test scores accompanies the interpretive report and should not be interpreted by unqualified individuals or in isolation without reference to the report. Test scores are relative to age, gender, and educational history as available and appropriate.   Performance Validity        "A" Random Letter Test Raw  Descriptor      Errors 0 Within Expectation  The Dot Counting Test: 8 Within Expectation  NAB Effort Index 0 Within Expectation      Mental Status Screening     Total Score Descriptor  MoCA 30 Normal      Expected Functioning        Wide Range Achievement Test: Standard/Scaled Score Percentile      Word Reading 109 73      Reynolds Intellectual Screening Test Standard/T-score Percentile      Guess What 57 75      Odd Item Out 54 66  RIST Index 110 75      Attention/Processing Speed        Neuropsychological Assessment Battery (Attention Module, Form 1): T-score Percentile      Digits Forward 41 18      Digits Backwards 50 50      Repeatable Battery for the Assessment of Neuropsychological Status (Form A): Scaled Score Percentile      Coding 10 50      Language        Neuropsychological Assessment Battery (Language Module, Form 1): T-score Percentile      Naming   (31) 58 79      Verbal Fluency: T-score Percentile      Controlled Oral Word Association (F-A-S) 41 18      Semantic Fluency (Animals) 43 25      Memory:        Neuropsychological Assessment Battery (Memory Module, Form 1): T-score/Standard Score  Percentile  Memory Index (MEM): 98 45      List Learning           List A Immediate Recall   (6 , 7 , 11) 49 46         List B Immediate Recall   (4) 45 31         List A Short Delayed Recall   (8) 50 50         List A Long Delayed Recall   (7) 46 34         List A Percent Retention   (88 %) --- 42         List A Long Delayed Yes/No Recognition Hits   (12) --- 88         List A Long Delayed Yes/No Recognition False Alarms   (0) --- 88         List A Recognition Discriminability Index --- 93      Shape Learning           Immediate Recognition   (5 , 5 , 2) 38 12  Delayed Recognition   (6) 52 58         Percent Retention   (300 %) --- >99         Delayed Forced-Choice Recognition Hits   (7) --- 25         Delayed Forced-Choice Recognition False Alarms   (0) --- 75         Delayed Forced-Choice Recognition Discriminability --- 58      Story Learning           Immediate Recall   (24, 39) 47 38         Delayed Recall   (39) 65 93         Percent Retention   (100 %) --- 62      Daily Living Memory            Immediate Recall   (18, 21) 40 16          Delayed Recall   (9, 8) 61 86          Percent Retention (100 %) --- 84          Recognition Hits   (10) --- 86      Repeatable Battery for the Assessment of Neuropsychological Status (Form A): Scaled Score Percentile         Figure Recall   (15) 12 75      Visuospatial/Constructional Functioning        Repeatable Battery for the Assessment of Neuropsychological Status (Form A): Standard/Scaled Score Percentile     Visuospatial/Constructional Index 112 79         Figure Copy   (18) 10 50         Judgment of Line Orientation   (20) --- >75      Executive Functioning        [employer] Test (MWCST): Standard/T-Score Percentile      Number of Categories Correct 55 69      Number of Perseverative Errors 62 88      Number of Total Errors 60 84      Percent Perseverative Errors 62 88  Executive Function  Composite 114 82      Trail Making Test: T-Score Percentile      Part A 46 34      Part B 52 58      [place] Diagnostic Aphasia Exam: Raw Score Scaled Score      Complex Ideational Material 12 12      Clock Drawing Raw Score Descriptor      Command 10 WNL      Rating Scales         Raw Score Descriptor  Beck Depression Inventory - II 2 Within Normal Limits  Beck Anxiety Inventory 3 Within Normal Limits   Jaquon Gingerich V. Nicole Kindred PsyD, [place] Clinical Neuropsychologist

## 2020-09-29 NOTE — Patient Instructions (Signed)
[name] , Thank you for taking time to complete your Medicare Wellness Visit. I appreciate your ongoing commitment to your health goals. Please review the following plan we discussed and let me know if I can assist you in the future.   Screening recommendations/referrals: Colonoscopy: Completed 04/18/2016-No longer required after age 73. Mammogram: Completed 12/25/2019-Due 12/24/2020 Bone Density: Completed 12/25/2019-Due 12/24/2021 Recommended yearly ophthalmology/optometry visit for glaucoma screening and checkup Recommended yearly dental visit for hygiene and checkup  Vaccinations: Influenza vaccine: Up to date Pneumococcal vaccine: Up to date Tdap vaccine:Up to date-Due 06/03/2022 Shingles vaccine: Completed vaccines   Covid-19:Up to date  Advanced directives: Information mailed today  Conditions/risks identified: See problem list  Next appointment: Follow up in one year for your annual wellness visit    Preventive Care 65 Years and Older, Female Preventive care refers to lifestyle choices and visits with your health care provider that can promote health and wellness. What does preventive care include? A yearly physical exam. This is also called an annual well check. Dental exams once or twice a year. Routine eye exams. Ask your health care provider how often you should have your eyes checked. Personal lifestyle choices, including: Daily care of your teeth and gums. Regular physical activity. Eating a healthy diet. Avoiding tobacco and drug use. Limiting alcohol use. Practicing safe sex. Taking low-dose aspirin every day. Taking vitamin and mineral supplements as recommended by your health care provider. What happens during an annual well check? The services and screenings done by your health care provider during your annual well check will depend on your age, overall health, lifestyle risk factors, and family history of disease. Counseling  Your health care provider may ask  you questions about your: Alcohol use. Tobacco use. Drug use. Emotional well-being. Home and relationship well-being. Sexual activity. Eating habits. History of falls. Memory and ability to understand (cognition). Work and work Statistician. Reproductive health. Screening  You may have the following tests or measurements: Height, weight, and BMI. Blood pressure. Lipid and cholesterol levels. These may be checked every 5 years, or more frequently if you are over 44 years old. Skin check. Lung cancer screening. You may have this screening every year starting at age 63 if you have a 30-pack-year history of smoking and currently smoke or have quit within the past 15 years. Fecal occult blood test (FOBT) of the stool. You may have this test every year starting at age 45. Flexible sigmoidoscopy or colonoscopy. You may have a sigmoidoscopy every 5 years or a colonoscopy every 10 years starting at age 65. Hepatitis C blood test. Hepatitis B blood test. Sexually transmitted disease (STD) testing. Diabetes screening. This is done by checking your blood sugar (glucose) after you have not eaten for a while (fasting). You may have this done every 1-3 years. Bone density scan. This is done to screen for osteoporosis. You may have this done starting at age 70. Mammogram. This may be done every 1-2 years. Talk to your health care provider about how often you should have regular mammograms. Talk with your health care provider about your test results, treatment options, and if necessary, the need for more tests. Vaccines  Your health care provider may recommend certain vaccines, such as: Influenza vaccine. This is recommended every year. Tetanus, diphtheria, and acellular pertussis (Tdap, Td) vaccine. You may need a Td booster every 10 years. Zoster vaccine. You may need this after age 57. Pneumococcal 13-valent conjugate (PCV13) vaccine. One dose is recommended after age 53. Pneumococcal  polysaccharide (PPSV23) vaccine. One dose is recommended after age 30. Talk to your health care provider about which screenings and vaccines you need and how often you need them. This information is not intended to replace advice given to you by your health care provider. Make sure you discuss any questions you have with your health care provider. Document Released: 04/17/2015 Document Revised: 12/09/2015 Document Reviewed: 01/20/2015 Elsevier Interactive Patient Education  2017 [employer] Prevention in the Home Falls can cause injuries. They can happen to people of all ages. There are many things you can do to make your home safe and to help prevent falls. What can I do on the outside of my home? Regularly fix the edges of walkways and driveways and fix any cracks. Remove anything that might make you trip as you walk through a door, such as a raised step or threshold. Trim any bushes or trees on the path to your home. Use bright outdoor lighting. Clear any walking paths of anything that might make someone trip, such as rocks or tools. Regularly check to see if handrails are loose or broken. Make sure that both sides of any steps have handrails. Any raised decks and porches should have guardrails on the edges. Have any leaves, snow, or ice cleared regularly. Use sand or salt on walking paths during winter. Clean up any spills in your garage right away. This includes oil or grease spills. What can I do in the bathroom? Use night lights. Install grab bars by the toilet and in the tub and shower. Do not use towel bars as grab bars. Use non-skid mats or decals in the tub or shower. If you need to sit down in the shower, use a plastic, non-slip stool. Keep the floor dry. Clean up any water that spills on the floor as soon as it happens. Remove soap buildup in the tub or shower regularly. Attach bath mats securely with double-sided non-slip rug tape. Do not have throw rugs and other  things on the floor that can make you trip. What can I do in the bedroom? Use night lights. Make sure that you have a light by your bed that is easy to reach. Do not use any sheets or blankets that are too big for your bed. They should not hang down onto the floor. Have a firm chair that has side arms. You can use this for support while you get dressed. Do not have throw rugs and other things on the floor that can make you trip. What can I do in the kitchen? Clean up any spills right away. Avoid walking on wet floors. Keep items that you use a lot in easy-to-reach places. If you need to reach something above you, use a strong step stool that has a grab bar. Keep electrical cords out of the way. Do not use floor polish or wax that makes floors slippery. If you must use wax, use non-skid floor wax. Do not have throw rugs and other things on the floor that can make you trip. What can I do with my stairs? Do not leave any items on the stairs. Make sure that there are handrails on both sides of the stairs and use them. Fix handrails that are broken or loose. Make sure that handrails are as long as the stairways. Check any carpeting to make sure that it is firmly attached to the stairs. Fix any carpet that is loose or worn. Avoid having throw rugs at the top or  bottom of the stairs. If you do have throw rugs, attach them to the floor with carpet tape. Make sure that you have a light switch at the top of the stairs and the bottom of the stairs. If you do not have them, ask someone to add them for you. What else can I do to help prevent falls? Wear shoes that: Do not have high heels. Have rubber bottoms. Are comfortable and fit you well. Are closed at the toe. Do not wear sandals. If you use a stepladder: Make sure that it is fully opened. Do not climb a closed stepladder. Make sure that both sides of the stepladder are locked into place. Ask someone to hold it for you, if possible. Clearly  mark and make sure that you can see: Any grab bars or handrails. First and last steps. Where the edge of each step is. Use tools that help you move around (mobility aids) if they are needed. These include: Canes. Walkers. Scooters. Crutches. Turn on the lights when you go into a dark area. Replace any light bulbs as soon as they burn out. Set up your furniture so you have a clear path. Avoid moving your furniture around. If any of your floors are uneven, fix them. If there are any pets around you, be aware of where they are. Review your medicines with your doctor. Some medicines can make you feel dizzy. This can increase your chance of falling. Ask your doctor what other things that you can do to help prevent falls. This information is not intended to replace advice given to you by your health care provider. Make sure you discuss any questions you have with your health care provider. Document Released: 01/15/2009 Document Revised: 08/27/2015 Document Reviewed: 04/25/2014 Elsevier Interactive Patient Education  2017 [employer].

## 2020-10-08 NOTE — Progress Notes (Signed)
[facility]  Patient Name: Cindy [name] MRN: [id number] Date of Birth: 1945/11/11 Age: 75 y.o. Education: 47 years  Clinical Impressions  Cindy [name] is a 75 y.o., right-hand dominant, divorced woman with a history of OSA (on CPAP), DM2, HLD, and depression. She has a family history of dementia and wanted to get a "baseline" assessment, she also notices some very minor cognitive difficulties day-to-day. She is nonetheless completely independent and is still functioning at a high level. 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 This is a normal neuropsychological study. Specifically, Cindy [name] demonstrated good cognitive performance in all areas assessed. There are no patterns of performance concerning for impairment in any area.  Likewise, Cindy [name] reported very low levels of symptoms on self-report measures of affective symptoms associated with depression and anxiety. She will be reassured regarding her cognition and does not appear to have an identifiable psychiatric disorder in need of treatment either.   Recommendations to be discussed with patient  You did very well on neuropsychological testing, which is good and is also unsurprising because you said that you are doing well day-to-day and you are not having any significant problems functioning. This should reassure you that relative to other adults your age, you are doing quite well. I will nevertheless offer you the following healthy aging suggestions, which you may find beneficial:  Countless studies have found a link between diabetes (particularly midlife diabetes) and dementia, with general estimates of about 1.5 - 2 times the general population risk of developing dementia for diabetic individuals. Individuals with poorly controlled diabetes in midlife are at particularly high risk with those having an  HbA1c ? 6.5% at about a 3-fold risk and those with an HbA1c ? 7% at a 5-fold risk of developing dementia as compared with the general population. Work diligently to control your diabetes. Many of the healthy lifestyle changes that can help manage blood sugar (things like exercise and diet) have also been shown to have beneficial effects on brain health.   Some cognitive abilities (such as processing speed) naturally decline with age, but there are many things you can do to contribute to healthy cognitive aging. There is evidence from at least one study that a modified low carbohydrate mediterranean diet (the MIND-DASH) diet can contribute to healthy cognitive aging. There is also some evidence to suggest a beneficial effect of coffee (black coffee without sugar or other additives such as cream) for healthy aging. One glass of red wine a day may also contribute to healthy aging though at two drinks you lose all benefit and at three drinks it may be doing more harm than good. Staying active, mentally and physically, are also crucially important. One of the best ways to do this is simply to stay active and engaged in your life, particularly with social activities. Challenging the mind and other cognitively stimulating activities are encouraged, consider learning a new hobby, reconnecting with old friends, reading an interesting and thought provoking book. It is not so much what you do that is important as it is that you enjoy it and stay at it.   There are few things as disruptive to brain functioning as not getting a good night's sleep. For sleep, I recommend against using medications, which can have lingering sedating effects on the brain and rob your brain of restful REM sleep. Instead, consider  trying some of the following sleep hygiene recommendations. They may not work at once and may take effort, but the effort you spend is likely to be rewarded with better sleep eventually:  Stick to a sleep schedule of  the same bedtime and wake time even on the weekends, which can help to regulate your body's internal clock so that you fall asleep and stay asleep.  Practice a relaxing bedtime ritual (conducted away from bright lights) which will help separate your sleep from stimulating activities and prepare your body to fall asleep when you go to bed.  Avoid naps, especially in the afternoon.  Evaluate your room and create conditions that will promote sleep such as keeping it cool (between 60 - 67 degrees), quiet, and free from any lights. Consider using blackout curtains, a "white noise" generator, or fan that will help mask any noises that might prevent you from going to sleep or awaken you during the night.  Sleep on a comfortable mattress and pillows.  Avoid bright light in the evening and excessive use of portable electronic devices right before bed that may contain light frequencies that can contribute to sleep problems.  Avoid alcohol, cigarettes, or heavy meals in the evening. If you must eat, consume a light snack 45 minutes before bed.  Use your bed only for sleep to strengthen the association between your bed and sleep.  If you can't go to sleep within 30 minutes, go into another room and do something relaxing until you feel tired. Then, come back and try to go to sleep again for 30 minutes and repeat until sleep is achieved.  Some people find over the counter melatonin to be helpful for sleep, which you could discuss with a pharmacist or prescribing provider.   You now have a strong baseline against which to compare your performance in the future, if you are concerned that any decline has occurred.   Test Findings  Test scores are summarized in additional documentation associated with this encounter. Test scores are relative to age, gender, and educational history as available and appropriate. There were no concerns about performance validity as all findings fell within normal expectations.   General  Intellectual Functioning/Achievement:  Performance on single word reading was toward the top of the average range and performance on the RIST index was in the high average range. She demonstrated high average performance on the more verbally oriented portion of the test and average performance on the more nonverbally oriented portion of the test. This was used as a basis of comparison for the patient's cognitive test data.   Attention and Processing Efficiency: Performance on indicators of attention and processing efficiency was good, with low average digit repetition forward, average digit repetition backward, and average performance on timed measures of processing speed involving simple numeric sequencing and timed number-symboil coding.   Language: Performance on language measures was good, with errorless visual object confrontation naming. Generation of words in response to the letters F-A-S and the category prompt "animals" were both average.   Visuospatial Function: Indicators of visuospatial and constructional functioning generated average findings overall, on the RBANS Visuospatial/constructional index. Performance was average on figure copy whereas judgment of angular line orientations was high average.   Learning and Memory: Performance on measures of learning and memory was generally good, with an overall average range score. She generally demonstrated better retention of information than encoding but performance was within normal limits in all areas.   In the verbal realm, she performed at  an average level on immediate recall of material including a brief short story and a 12-item word list. Delayed recall for the word list was average but delayed recall for the short story was truly exceptional, falling in the superior range. Performance on yes/no recognition discriminability for the word list vs. false choices was errorless, in the superior range. Memory for brief daily living material  was low average on immediate recall and high average on delayed recall. Recognition for the daily-living information was high average.   In the visual realm, immediate recognition for a series of designs that are difficult to verbally encode was low average, mainly due to variability in learning on the last trial. She did better with delayed recognition with an average range score. Yes/no discriminability for the target designs versus false choices was average. Memory for a modestly complex geometric figure was also good, falling at the top of the average range.   Executive Functions: Cindy [name] performed very well on all measures within this domain. Her performance was high average on the [employer] Function Composite Score. Her categories completed score was average, whereas her perseverative errors score was high average. Performance on alternating sequencing of numbers and letters of the alphabet was average. Generation of words in response to the letters F-A-S was low average. Performance was normal on clock drawing with good face formation, numbering, and hand placement. She did fine when reasoning on the Complex Ideational Material, with an errorless score.   Rating Scale(s): Cindy [name] reported minimal levels of symptoms on self-rating scales of psychiatric symptoms.   Viviano Simas Nicole Kindred, PsyD, ABN Clinical Neuropsychologist  Coding and Compliance  Billing below reflects technician time, my direct face-to-face time with the patient, time spent in test administration, and time spent in professional activities including but not limited to: neuropsychological test interpretation, integration of neuropsychological test data with clinical history, report preparation, treatment planning, care coordination, and review of diagnostically pertinent medical history or studies.   Services associated with this encounter: Clinical Interview [phone]) plus 135 minutes  (96132/96133; Neuropsychological Evaluation by Professional)  22 minutes (96136/96137; Test Administration by Professional) 110 minutes (96138/96139; Neuropsychological Testing by Technician)

## 2020-10-09 ENCOUNTER — Encounter: Payer: Medicare Other | Admitting: Counselor

## 2020-10-15 ENCOUNTER — Encounter: Payer: Self-pay | Admitting: Counselor

## 2020-10-15 ENCOUNTER — Ambulatory Visit (INDEPENDENT_AMBULATORY_CARE_PROVIDER_SITE_OTHER): Payer: Medicare Other | Admitting: Counselor

## 2020-10-15 ENCOUNTER — Other Ambulatory Visit: Payer: Self-pay

## 2020-10-15 DIAGNOSIS — F09 Unspecified mental disorder due to known physiological condition: Secondary | ICD-10-CM | POA: Diagnosis not present

## 2020-10-15 NOTE — Progress Notes (Signed)
NEUROPSYCHOLOGY FEEDBACK NOTE [facility]  Feedback Note: I met with Cindy [name] to review the findings resulting from her neuropsychological evaluation. Since the last appointment, she has been about the same. Time was spent reviewing the impressions and recommendations that are detailed in the evaluation report. We discussed impression of normal cognitive performance, as reflected in the patient instructions. I took time to explain the findings and answer all the patient's questions. I encouraged Cindy [name] to contact me should she have any further questions or if further follow up is desired.   Current Medications and Medical History   Current Outpatient Medications  Medication Sig Dispense Refill   aspirin 81 MG tablet Take 81 mg by mouth daily.     cholecalciferol (VITAMIN D) 1000 units tablet Take 2,000 Units by mouth daily.      DULoxetine (CYMBALTA) 30 MG capsule TAKE ONE CAPSULE BY MOUTH DAILY *NEEDS OFFICE VISIT FOR FURTHER REFILLS* 90 capsule 1   ezetimibe (ZETIA) 10 MG tablet Take 1 tablet (10 mg total) by mouth daily. 90 tablet 1   fluticasone (FLONASE) 50 MCG/ACT nasal spray Place 1 spray into both nostrils as needed.      levocetirizine (XYZAL) 5 MG tablet Take 5 mg by mouth as needed.   3   metFORMIN (GLUCOPHAGE) 500 MG tablet Take 1 tablet (500 mg total) by mouth 2 (two) times daily with a meal. 180 tablet 1   pravastatin (PRAVACHOL) 80 MG tablet Take 1 tablet (80 mg total) by mouth daily. 90 tablet 0   Turmeric 500 MG CAPS Take by mouth.     No current facility-administered medications for this visit.   Patient Active Problem List   Diagnosis Date Noted   Plantar fasciitis, bilateral 07/31/2017   Syncope 01/26/2017   Other fatigue 01/10/2017   Shortness of breath on exertion 01/10/2017   Vitamin D deficiency 01/10/2017   Other hyperlipidemia 01/10/2017   Acute bursitis of left shoulder 05/19/2016   Abnormal liver function tests 07/19/2015   Carpal  tunnel syndrome 12/04/2014   Medicare annual wellness visit, subsequent 07/06/2014   Sinusitis 11/16/2011   Obesity 10/05/2011   Knee pain, left 10/05/2011   Type 2 diabetes mellitus with hyperlipidemia ([place])    Back pain    Varicose vein of leg    Preventative health care 07/29/2011   Allergic state 02/01/2010   FATIGUE 06/29/2009   TINGLING 01/27/2009   CHEST DISCOMFORT, ATYPICAL 01/27/2009   Obstructive sleep apnea 03/05/2007   DDD (degenerative disc disease), lumbosacral 12/29/2006   INCREASED BLOOD PRESSURE 10/30/2006   FIBROIDS, UTERUS 09/22/2006   Hyperlipidemia 09/22/2006   Depression, recurrent ([place]) 09/22/2006   OSTEOARTHRITIS 09/22/2006   Closed fracture of patella 04/08/2004   Abnormal weight gain 06/12/2003   Other dyspnea and respiratory abnormality 06/12/2003   Mental Status and Behavioral Observations  Cindy [name] presented on time to the present encounter and was alert and generally oriented. Speech was normal in rate, rhythm, volume, and prosody. Self-reported mood was "good" and affect was euthymic. Thought process was logical and goal oriented and thought content was appropriate to the topics discussed. There were no safety concerns identified at today's encounter, such as thoughts of harming self or others.   Plan  Feedback provided regarding the patient's neuropsychological evaluation. She is doing well cognitively. We discussed the limited clinical role of genetics in Alzheimer's, apart from causative mutations, which she is unlikely to have. We discussed MIND diet, normal age-related cognitive changes, and normal  variability in cognitive strengths/weaknesses. Cindy [name] was encouraged to contact me if any questions arise or if further follow up is desired.   Viviano Simas Nicole Kindred, PsyD, ABN Clinical Neuropsychologist  Service(s) Provided at This Encounter: 25 minutes [phone]; Psychotherapy with patient/family)

## 2020-10-15 NOTE — Patient Instructions (Addendum)
You did very well on neuropsychological testing, which is good and is also unsurprising because you said that you are doing well day-to-day and you are not having any significant problems functioning. This should reassure you that relative to other adults your age, you are doing quite well. I will nevertheless offer you the following healthy aging suggestions, which you may find beneficial:   Countless studies have found a link between diabetes (particularly midlife diabetes) and dementia, with general estimates of about 1.5 - 2 times the general population risk of developing dementia for diabetic individuals. Individuals with poorly controlled diabetes in midlife are at particularly high risk with those having an HbA1c ? 6.5% at about a 3-fold risk and those with an HbA1c ? 7% at a 5-fold risk of developing dementia as compared with the general population. Work diligently to control your diabetes. Many of the healthy lifestyle changes that can help manage blood sugar (things like exercise and diet) have also been shown to have beneficial effects on brain health.   Some cognitive abilities (such as processing speed) naturally decline with age, but there are many things you can do to contribute to healthy cognitive aging. There is evidence from at least one study that a modified low carbohydrate mediterranean diet (the MIND-DASH) diet can contribute to healthy cognitive aging. There is also some evidence to suggest a beneficial effect of coffee (black coffee without sugar or other additives such as cream) for healthy aging. One glass of red wine a day may also contribute to healthy aging though at two drinks you lose all benefit and at three drinks it may be doing more harm than good. Staying active, mentally and physically, are also crucially important. One of the best ways to do this is simply to stay active and engaged in your life, particularly with social activities. Challenging the mind and other  cognitively stimulating activities are encouraged, consider learning a new hobby, reconnecting with old friends, reading an interesting and thought provoking book. It is not so much what you do that is important as it is that you enjoy it and stay at it.   There are few things as disruptive to brain functioning as not getting a good night's sleep. For sleep, I recommend against using medications, which can have lingering sedating effects on the brain and rob your brain of restful REM sleep. Instead, consider trying some of the following sleep hygiene recommendations. They may not work at once and may take effort, but the effort you spend is likely to be rewarded with better sleep eventually:   Stick to a sleep schedule of the same bedtime and wake time even on the weekends, which can help to regulate your body's internal clock so that you fall asleep and stay asleep. Practice a relaxing bedtime ritual (conducted away from bright lights) which will help separate your sleep from stimulating activities and prepare your body to fall asleep when you go to bed. Avoid naps, especially in the afternoon. Evaluate your room and create conditions that will promote sleep such as keeping it cool (between 60 - 67 degrees), quiet, and free from any lights. Consider using blackout curtains, a "white noise" generator, or fan that will help mask any noises that might prevent you from going to sleep or awaken you during the night. Sleep on a comfortable mattress and pillows. Avoid bright light in the evening and excessive use of portable electronic devices right before bed that may contain light frequencies that can contribute to  sleep problems. Avoid alcohol, cigarettes, or heavy meals in the evening. If you must eat, consume a light snack 45 minutes before bed. Use your bed only for sleep to strengthen the association between your bed and sleep. If you can't go to sleep within 30 minutes, go into another room and do  something relaxing until you feel tired. Then, come back and try to go to sleep again for 30 minutes and repeat until sleep is achieved. Some people find over the counter melatonin to be helpful for sleep, which you could discuss with a pharmacist or prescribing provider.   There is now good quality evidence from at least one large scale study that a modified mediterranean diet may help slow cognitive decline. This is known as the "MIND" diet. The Mind diet is not so much a specific diet as it is a set of recommendations for things that you should and should not eat.   Foods that are ENCOURAGED on the MIND Diet:  Green, leafy vegetables: Aim for six or more servings per week. This includes kale, spinach, cooked greens and salads.  All other vegetables: Try to eat another vegetable in addition to the green leafy vegetables at least once a day. It is best to choose non-starchy vegetables because they have a lot of nutrients with a low number of calories.  Berries: Eat berries at least twice a week. There is a plethora of research on strawberries, and other berries such as blueberries, raspberries and blackberries have also been found to have antioxidant and brain health benefits.  Nuts: Try to get five servings of nuts or more each week. The creators of the [place] don't specify what kind of nuts to consume, but it is probably best to vary the type of nuts you eat to obtain a variety of nutrients. Peanuts are a legume and do not fall into this category.  Olive oil: Use olive oil as your main cooking oil. There may be other heart-healthy alternatives such as algae oil, though there is not yet sufficient research upon which to base a formal recommendation.  Whole grains: Aim for at least three servings daily. Choose minimally processed grains like oatmeal, quinoa, brown rice, whole-wheat pasta and 100% whole-wheat bread.  Fish: Eat fish at least once a week. It is best to choose fatty fish like salmon,  sardines, trout, tuna and mackerel for their high amounts of omega-3 fatty acids.  Beans: Include beans in at least four meals every week. This includes all beans, lentils and soybeans.  Poultry: Try to eat chicken or [place] at least twice a week. Note that fried chicken is not encouraged on the MIND diet.  Wine: Aim for no more than one glass of alcohol daily. Both red and white wine may benefit the brain. However, much research has focused on the red wine compound resveratrol, which may help protect against Alzheimer's disease.  Foods that are DISCOURAGED on the MIND Diet: Butter and margarine: Try to eat less than 1 tablespoon (about 14 grams) daily. Instead, try using olive oil as your primary cooking fat, and dipping your bread in olive oil with herbs.  Cheese: The MIND diet recommends limiting your cheese consumption to less than once per week.  Red meat: Aim for no more than three servings each week. This includes all beef, pork, lamb and products made from these meats.  [place] food: The MIND diet highly discourages fried food, especially the kind from fast-food restaurants. Limit your consumption to less  than once per week.  Pastries and sweets: This includes most of the processed junk food and desserts you can think of. Ice cream, cookies, brownies, snack cakes, donuts, candy and more. Try to limit these to no more than four times a week.  You now have a strong baseline against which to compare your performance in the future, if you are concerned that any decline has occurred.

## 2020-11-12 ENCOUNTER — Other Ambulatory Visit: Payer: Self-pay | Admitting: Family Medicine

## 2020-11-12 DIAGNOSIS — E7849 Other hyperlipidemia: Secondary | ICD-10-CM

## 2020-11-12 DIAGNOSIS — E119 Type 2 diabetes mellitus without complications: Secondary | ICD-10-CM

## 2020-11-21 IMAGING — MG MM DIGITAL SCREENING BILAT W/ TOMO W/ CAD
6 of 10 series · 6 of 30 positions shown · non-contrast
Comparison: Previous exam(s).

CLINICAL DATA: Screening.

EXAM:
DIGITAL SCREENING BILATERAL MAMMOGRAM WITH TOMO AND CAD

[L CC synth-2D]
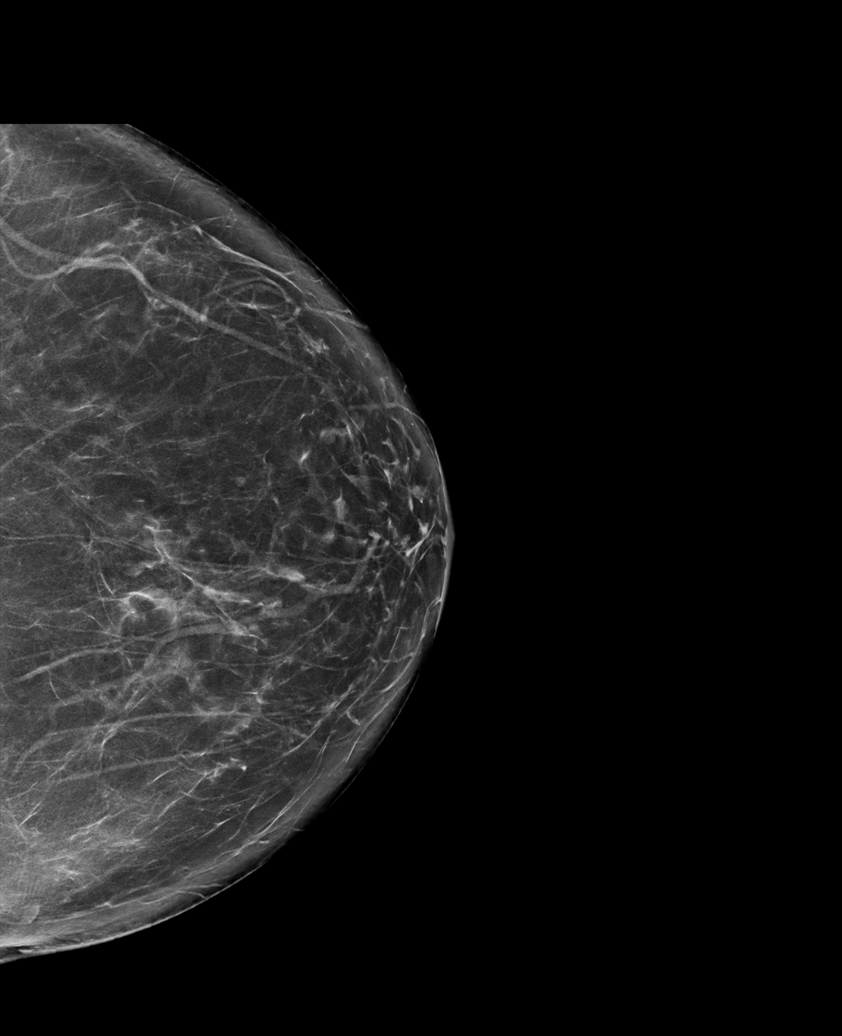

[R CC synth-2D (1 of 2)]
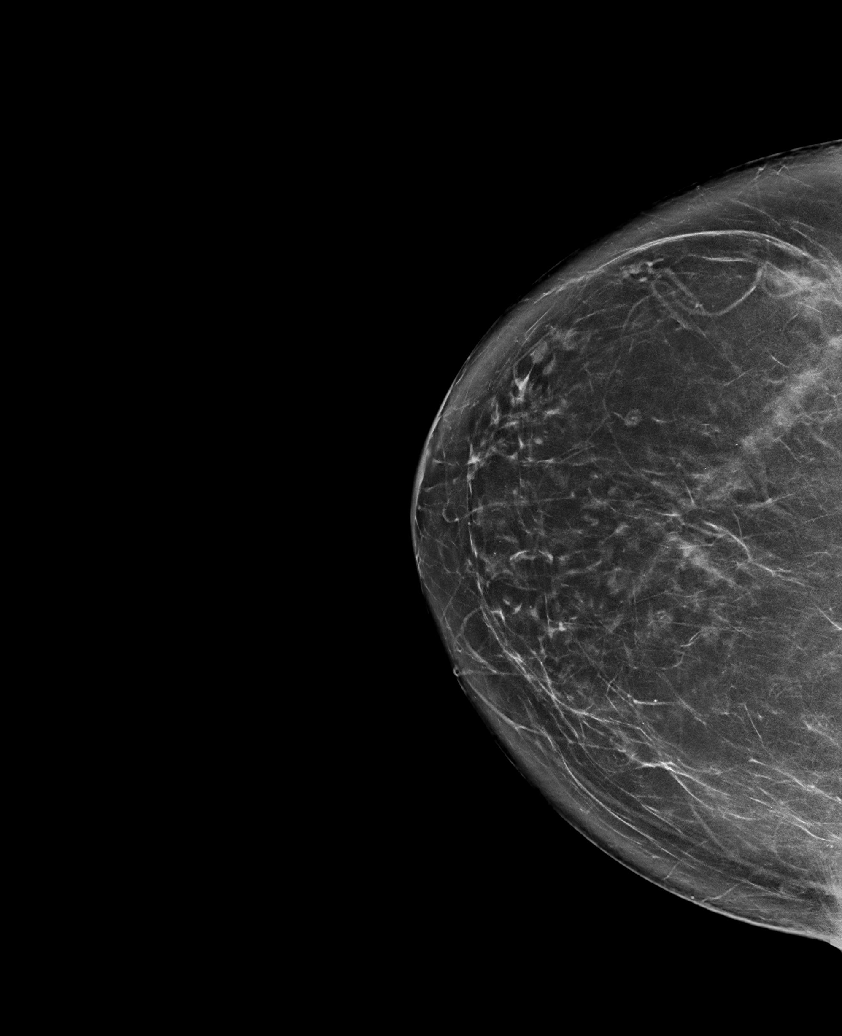

[L MLO synth-2D]
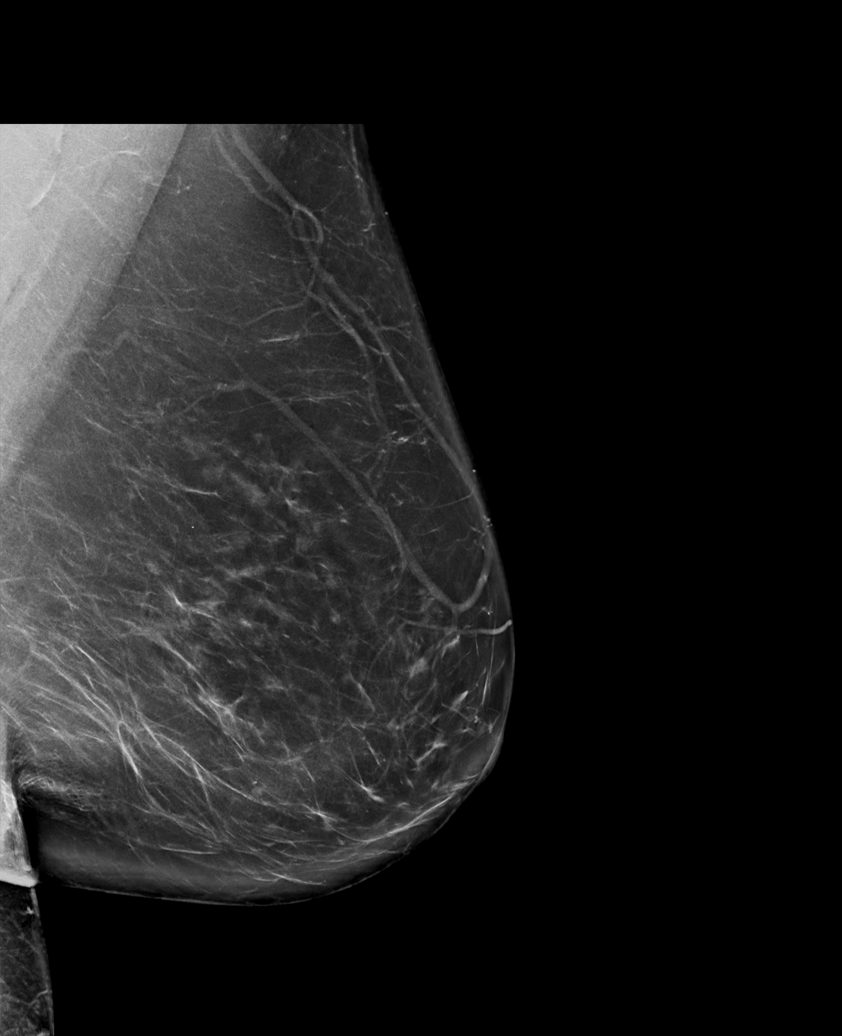

[R MLO synth-2D]
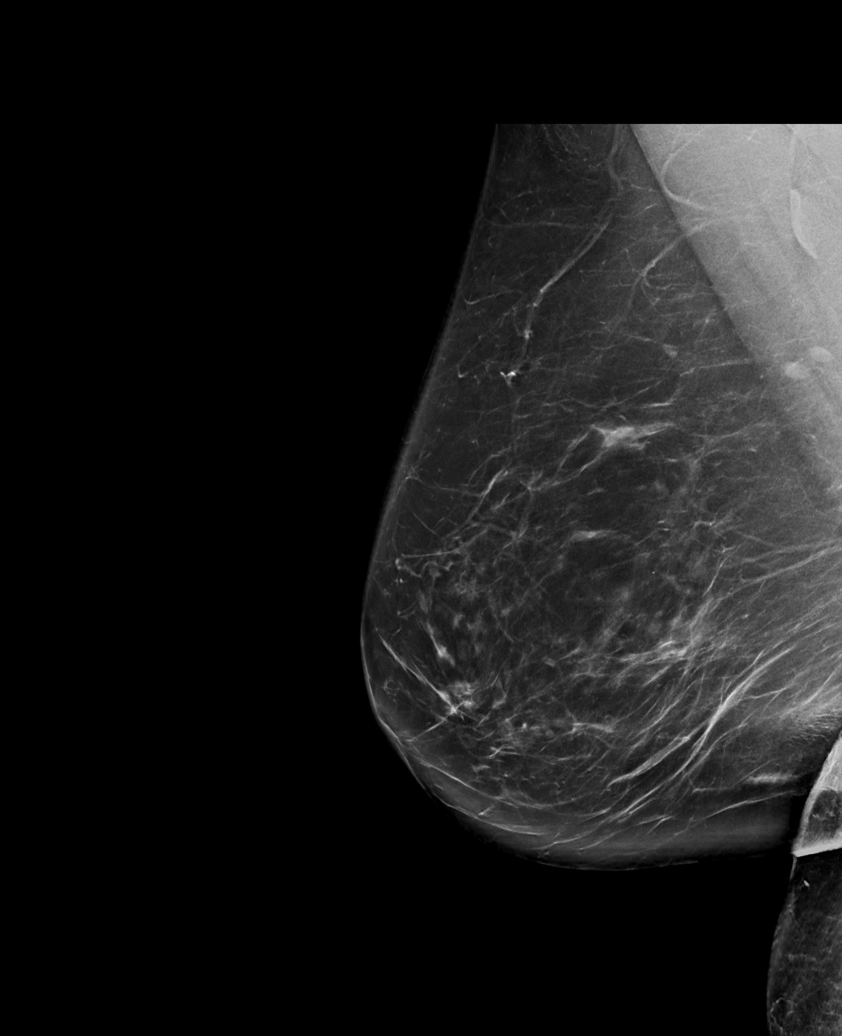

[R CC synth-2D (2 of 2)]
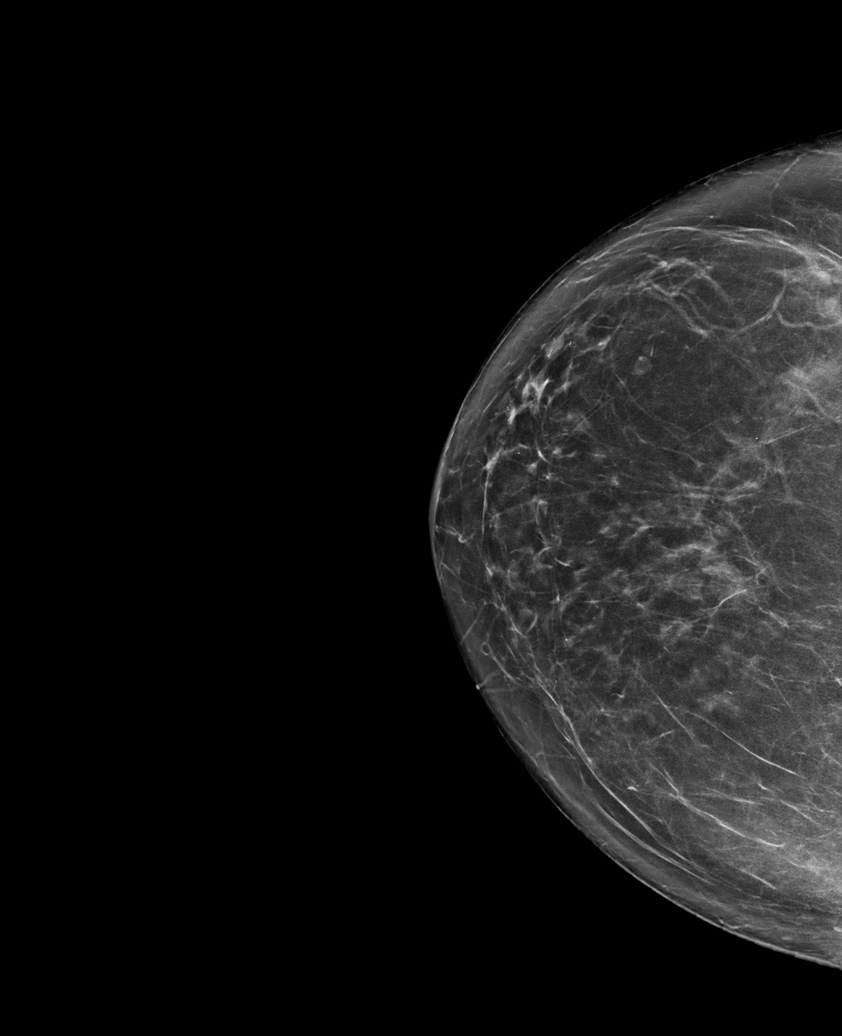

[R CC tomo · tomo slice 37/72.0]
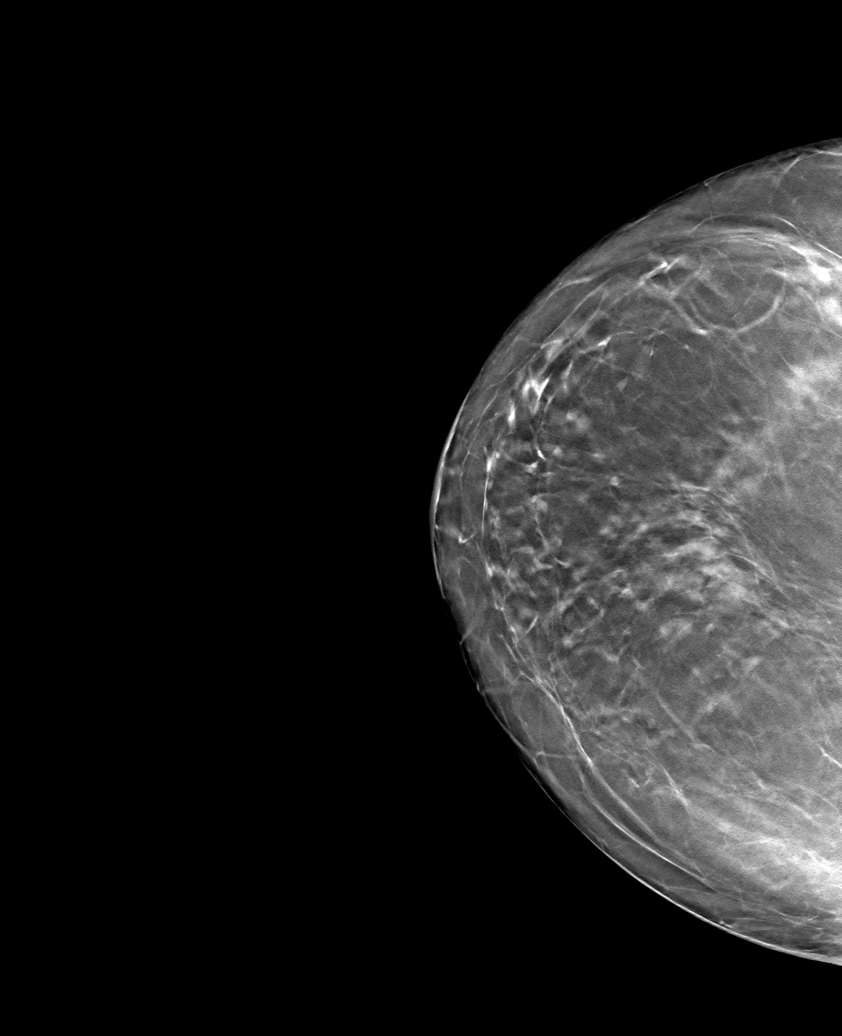

[6 of 30 positions shown; findings below may reference images not displayed]

ACR Breast Density Category b: There are scattered areas of
fibroglandular density.
FINDINGS: There are no findings suspicious for malignancy. Images were
processed with CAD.
IMPRESSION: No mammographic evidence of malignancy. A result letter of this
screening mammogram will be mailed directly to the patient.

RECOMMENDATION:
Screening mammogram in one year. (Code:[id number])

BI-RADS CATEGORY  1: Negative.

## 2020-11-23 ENCOUNTER — Other Ambulatory Visit: Payer: Self-pay | Admitting: Family Medicine

## 2020-11-27 LAB — HEMOGLOBIN A1C: Hemoglobin A1C: 5.6

## 2020-12-01 LAB — HM HEPATITIS C SCREENING LAB: HM Hepatitis Screen: NEGATIVE

## 2020-12-23 ENCOUNTER — Encounter: Payer: Self-pay | Admitting: *Deleted

## 2020-12-24 DIAGNOSIS — J069 Acute upper respiratory infection, unspecified: Secondary | ICD-10-CM | POA: Diagnosis not present

## 2020-12-24 DIAGNOSIS — Z20822 Contact with and (suspected) exposure to covid-19: Secondary | ICD-10-CM | POA: Diagnosis not present

## 2020-12-25 ENCOUNTER — Ambulatory Visit (INDEPENDENT_AMBULATORY_CARE_PROVIDER_SITE_OTHER): Payer: Medicare Other

## 2020-12-25 ENCOUNTER — Ambulatory Visit: Payer: Medicare Other | Admitting: Podiatry

## 2020-12-25 ENCOUNTER — Other Ambulatory Visit: Payer: Self-pay

## 2020-12-25 ENCOUNTER — Encounter: Payer: Self-pay | Admitting: Podiatry

## 2020-12-25 DIAGNOSIS — M722 Plantar fascial fibromatosis: Secondary | ICD-10-CM

## 2020-12-25 DIAGNOSIS — M7752 Other enthesopathy of left foot: Secondary | ICD-10-CM

## 2020-12-25 MED ORDER — TRIAMCINOLONE ACETONIDE 10 MG/ML IJ SUSP
10.0000 mg | Freq: Once | INTRAMUSCULAR | Status: AC
  Administered 2020-12-25: 10 mg

## 2020-12-25 NOTE — Progress Notes (Signed)
Subjective:   Patient ID: Cindy [name], female   DOB: 75 y.o.   MRN: [id number]   HPI Patient's think she is getting pain in her left heel and also her left ankle has become sore in the outside.  Does not remember specifically when this started but has been going on for about 4 months and gradually becoming more of an issue   ROS      Objective:  Physical Exam  Neurovascular status intact patient is found to have moderate discomfort in the plantar heel region left but low-grade and is noted to have more intense discomfort in the sinus tarsi left and into the lateral ankle gutter     Assessment:  Low-grade Planter fasciitis left with inflammatory capsulitis of the sinus tarsi lateral ankle gutter left     Plan:  H&P reviewed both conditions and for the heel she will use support therapy anti-inflammatory stretch topical medicines.  I did do sterile prep and injected the sinus tarsi left 3 mg Kenalog 5 mg Xylocaine in the lateral ankle gutter and will be seen back as needed  X-rays indicate no signs of fracture moderate arthritis of the subtalar joint small's plantar spur formation

## 2021-01-03 ENCOUNTER — Other Ambulatory Visit: Payer: Self-pay | Admitting: Family Medicine

## 2021-01-03 DIAGNOSIS — F339 Major depressive disorder, recurrent, unspecified: Secondary | ICD-10-CM

## 2021-01-20 DIAGNOSIS — G4733 Obstructive sleep apnea (adult) (pediatric): Secondary | ICD-10-CM | POA: Diagnosis not present

## 2021-02-19 ENCOUNTER — Other Ambulatory Visit: Payer: Self-pay

## 2021-02-19 ENCOUNTER — Ambulatory Visit: Payer: Medicare Other | Admitting: Podiatry

## 2021-02-19 DIAGNOSIS — M7662 Achilles tendinitis, left leg: Secondary | ICD-10-CM

## 2021-02-19 DIAGNOSIS — M722 Plantar fascial fibromatosis: Secondary | ICD-10-CM

## 2021-02-19 MED ORDER — TRIAMCINOLONE ACETONIDE 10 MG/ML IJ SUSP
10.0000 mg | Freq: Once | INTRAMUSCULAR | Status: AC
  Administered 2021-02-19: 10 mg

## 2021-02-19 NOTE — Progress Notes (Signed)
Subjective:   Patient ID: Cindy [name], female   DOB: 75 y.o.   MRN: [id number]   HPI Patient states bottom of the heel is doing very well but she is having a lot of pain in the back of her heel and that has not gotten better and gradually gotten worse over the last couple months neuro   ROS      Objective:  Physical Exam  Vascular status intact significant improvement plantar heel pain left with inflammation now noted posterior heel left at the insertion of the Achilles tendon to the calcaneus mostly on the medial side.  There is no tendon strength loss or other pathology     Assessment:  Acute Achilles tendinitis left with improved plantar fasciitis left     Plan:  H&P reviewed condition and at this point I have recommended careful injection first explaining the chance for rupture associated with the injection and as a another factor with this I have recommended cam walker to completely immobilize allow rest and protect the tendon.  She wants this approach understanding risk and today I did sterile prep and injected the medial side of the left Achilles not center or lateral 3 mg Dexasone Kenalog 5 mg Xylocaine and applied air fracture walker with instructions on usage.  Reappoint 3 weeks or earlier if needed

## 2021-02-23 ENCOUNTER — Other Ambulatory Visit: Payer: Self-pay | Admitting: Family Medicine

## 2021-03-12 ENCOUNTER — Ambulatory Visit: Payer: Medicare Other | Admitting: Podiatry

## 2021-03-12 ENCOUNTER — Other Ambulatory Visit: Payer: Self-pay

## 2021-03-12 ENCOUNTER — Encounter: Payer: Self-pay | Admitting: Podiatry

## 2021-03-12 DIAGNOSIS — M7662 Achilles tendinitis, left leg: Secondary | ICD-10-CM | POA: Diagnosis not present

## 2021-03-12 NOTE — Progress Notes (Signed)
Subjective:   Patient ID: Cindy [name], female   DOB: 75 y.o.   MRN: [id number]   HPI Patient presents stating I am feeling quite a bit better with only mild discomfort and I have not been wearing the boot full-time anymore   ROS      Objective:  Physical Exam  Neurovascular status intact with discomfort in the posterior heel that is improved by around 7580% currently with good range of motion no muscle strength loss     Assessment:  Achilles tendinitis left improving     Plan:  Advised on still wearing the boot part-time and gradual increase in activity and patient is encouraged to come in if any symptoms were to occur but I am satisfied with the progress at this time

## 2021-04-16 ENCOUNTER — Other Ambulatory Visit: Payer: Self-pay | Admitting: Family Medicine

## 2021-04-16 DIAGNOSIS — Z1231 Encounter for screening mammogram for malignant neoplasm of breast: Secondary | ICD-10-CM

## 2021-05-07 ENCOUNTER — Telehealth: Payer: Self-pay

## 2021-05-07 ENCOUNTER — Other Ambulatory Visit: Payer: Self-pay

## 2021-05-07 NOTE — Telephone Encounter (Signed)
Called pt to schedule appt with Dr. Charlett Blake or provider

## 2021-05-07 NOTE — Telephone Encounter (Signed)
Patient called back to let us know that she changed providers.

## 2021-05-18 ENCOUNTER — Other Ambulatory Visit: Payer: Self-pay | Admitting: Family Medicine

## 2021-05-18 DIAGNOSIS — E7849 Other hyperlipidemia: Secondary | ICD-10-CM

## 2021-05-18 DIAGNOSIS — E119 Type 2 diabetes mellitus without complications: Secondary | ICD-10-CM

## 2021-05-21 ENCOUNTER — Other Ambulatory Visit: Payer: Self-pay

## 2021-05-21 ENCOUNTER — Ambulatory Visit
Admission: RE | Admit: 2021-05-21 | Discharge: 2021-05-21 | Disposition: A | Discharge status: Home / Self Care | Payer: Medicare Other | Source: Ambulatory Visit | Attending: Family Medicine | Admitting: Family Medicine

## 2021-05-21 ENCOUNTER — Other Ambulatory Visit: Payer: Self-pay | Admitting: Family Medicine

## 2021-05-21 DIAGNOSIS — Z1231 Encounter for screening mammogram for malignant neoplasm of breast: Secondary | ICD-10-CM

## 2021-05-25 ENCOUNTER — Other Ambulatory Visit: Payer: Self-pay | Admitting: Family Medicine

## 2021-05-25 DIAGNOSIS — R928 Other abnormal and inconclusive findings on diagnostic imaging of breast: Secondary | ICD-10-CM

## 2021-05-27 ENCOUNTER — Other Ambulatory Visit (HOSPITAL_COMMUNITY)
Admission: RE | Admit: 2021-05-27 | Discharge: 2021-05-27 | Disposition: A | Discharge status: Home / Self Care | Payer: Medicare Other | Source: Ambulatory Visit | Attending: Obstetrics & Gynecology | Admitting: Obstetrics & Gynecology

## 2021-05-27 ENCOUNTER — Other Ambulatory Visit: Payer: Self-pay

## 2021-05-27 ENCOUNTER — Encounter (HOSPITAL_BASED_OUTPATIENT_CLINIC_OR_DEPARTMENT_OTHER): Payer: Self-pay | Admitting: Obstetrics & Gynecology

## 2021-05-27 ENCOUNTER — Ambulatory Visit (HOSPITAL_BASED_OUTPATIENT_CLINIC_OR_DEPARTMENT_OTHER): Payer: Medicare Other | Admitting: Obstetrics & Gynecology

## 2021-05-27 VITALS — BP 115/46 | HR 64 | Ht 64.25 in | Wt 166.0 lb

## 2021-05-27 DIAGNOSIS — Z9889 Other specified postprocedural states: Secondary | ICD-10-CM | POA: Diagnosis not present

## 2021-05-27 DIAGNOSIS — Z124 Encounter for screening for malignant neoplasm of cervix: Secondary | ICD-10-CM | POA: Insufficient documentation

## 2021-05-27 DIAGNOSIS — Z780 Asymptomatic menopausal state: Secondary | ICD-10-CM

## 2021-05-27 DIAGNOSIS — N952 Postmenopausal atrophic vaginitis: Secondary | ICD-10-CM

## 2021-05-27 DIAGNOSIS — Z1151 Encounter for screening for human papillomavirus (HPV): Secondary | ICD-10-CM | POA: Insufficient documentation

## 2021-05-27 DIAGNOSIS — R928 Other abnormal and inconclusive findings on diagnostic imaging of breast: Secondary | ICD-10-CM

## 2021-05-27 MED ORDER — ESTRADIOL 0.1 MG/GM VA CREA: TOPICAL_CREAM | VAGINAL | 1 refills | Status: DC

## 2021-05-27 NOTE — Progress Notes (Signed)
76 y.o. G2P1011 (adopted 1) Divorced White or Caucasian female here for new patient exam.  Denies vaginal bleeding.  Never on HRT.  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  This was 25 - 30 years ago.  Last pap was in 2018.  Guidelines reviewed.  Will obtain pap smear today.  Denies vaginal bleeding.  Not on HRT.  Health Maintenance: PCP:  Dr. Darlina Sicilian.  Next appt will be in about 6 months.   Vaccines are up to date:  pt is unsure if completed shingrix vaccination Colonoscopy:  04/18/16, neg with Dr. Fuller Plan, follow up 10 years MMG:  05/21/2021 with follow up scheduled.  Questions answered about this today. BMD:  12/25/19 osteopenia Last pap smear:  01/03/17 neg H/o abnormal pap smear:  remote hx with LEEP   reports that she has never smoked. She has never used smokeless tobacco. She reports current alcohol use of about 1.0 standard drink per week. She reports that she does not use drugs.  Past Medical History:  Diagnosis Date   Abnormal liver function tests 07/19/2015   Allergic state 02/01/2010   Qualifier: Diagnosis of  By: Regis Bill MD, Standley Brooking    Allergy    Anxiety    Back pain    Chicken pox 76 yrs old   Depressive disorder, not elsewhere classified    Diabetes ([place])    diet controlled- no medicines    Fibroid    History of fractured kneecap    right    Hyperglycemia    Hyperlipidemia    Infertility, female    Joint pain    Knee pain, left 10/05/2011   Follows with Dr Margarette Canada   Measles as a child   Medicare annual wellness visit, subsequent 07/06/2014   OA (osteoarthritis) of knee    left knee   Obesity 10/05/2011   Obesity 10/05/2011   Osteoarthritis    Osteopenia 06/26/2014   Other and unspecified hyperlipidemia    Overweight 10/05/2011   Overweight(278.02) 10/05/2011   Sleep apnea     On CPAP   Sleep apnea    Stye 11/19/2012   Syncope 01/26/2017   Thyroid disease    h/o hyperthyroid treated with radioactive iodine? from 16 to 20   Type  2 diabetes mellitus with hyperlipidemia ([place])    Varicose vein of leg     Past Surgical History:  Procedure Laterality Date   BACK SURGERY     L5 discectomy, 2002. helpful   CERVICAL BIOPSY  W/ LOOP ELECTRODE EXCISION     CESAREAN SECTION  1987   COLONOSCOPY     CPAP     KNEE SURGERY Right 2006   R knee fx. patella   LEEP     stitches in foot Right     Current Outpatient Medications  Medication Sig Dispense Refill   aspirin 81 MG tablet Take 81 mg by mouth daily.     CALCIUM 600 1500 (600 Ca) MG TABS tablet      cetirizine (ZYRTEC) 10 MG tablet Take 10 mg by mouth daily.     cholecalciferol (VITAMIN D) 1000 units tablet Take 2,000 Units by mouth daily.      DULoxetine (CYMBALTA) 30 MG capsule TAKE ONE CAPSULE BY MOUTH DAILY *NEEDS OFFICE VISIT FOR FURTHER REFILLS* 30 capsule 0   ezetimibe (ZETIA) 10 MG tablet TAKE ONE TABLET BY MOUTH DAILY 90 tablet 1   fluticasone (FLONASE) 50 MCG/ACT nasal spray Place 1 spray into both nostrils  as needed.      MAGNESIUM PO Take by mouth.     metFORMIN (GLUCOPHAGE) 500 MG tablet TAKE 1 TABLET (500 MG TOTAL) BY MOUTH 2 (TWO) TIMES DAILY WITH A MEAL 180 tablet 1   pravastatin (PRAVACHOL) 80 MG tablet TAKE ONE TABLET BY MOUTH DAILY 90 tablet 0   Turmeric 500 MG CAPS Take by mouth.     No current facility-administered medications for this visit.    Family History  Problem Relation Age of Onset   Diabetes Paternal Grandmother        type 2?   Dementia Maternal Grandmother        alzheimer   Alcohol abuse Maternal Grandfather    Hyperlipidemia Father    Heart disease Father        bypass, CAD   Sleep apnea Father    Hyperlipidemia Mother    Dementia Mother    Obesity Mother    Hyperlipidemia Sister    Diabetes Sister        type 2   Colon polyps Sister    Impulse control disorder Sister    Colon cancer Neg Hx    Esophageal cancer Neg Hx    Rectal cancer Neg Hx    Stomach cancer Neg Hx     Review of Systems  All other systems  reviewed and are negative.  Exam:   BP (!) 115/46    Pulse 64    Ht 5' 4.25" (1.632 m)    Wt 166 lb (75.3 kg)    LMP 04/04/2000    BMI 28.27 kg/m   Height: 5' 4.25" (163.2 cm)  General appearance: alert, cooperative and appears stated age Breasts: normal appearance, no masses or tenderness Abdomen: soft, non-tender; bowel sounds normal; no masses,  no organomegaly Lymph nodes: Cervical, supraclavicular, and axillary nodes normal.  No abnormal inguinal nodes palpated Neurologic: Grossly normal  Pelvic: External genitalia:  no lesions              Urethra:  normal appearing urethra with no masses, tenderness or lesions              Bartholins and Skenes: normal                 Vagina: normal appearing vagina with atrophic changes and no discharge, no lesions              Cervix: no lesions              Pap taken: Yes.   Bimanual Exam:  Uterus:  normal size, contour, position, consistency, mobility, non-tender              Adnexa: normal adnexa and no mass, fullness, tenderness               Rectovaginal: Confirms               Anus:  normal sphincter tone, no lesions  Chaperone, Octaviano Batty, CMA, was present for exam.  Assessment/Plan: 1. Postmenopausal - not on HRT - health maintenance reviewed today.  Colposcopy, BMD, lab work up to date.  Vaccines reviewed.  Shingrix has not been completed.  2. History of loop electrical excision procedure (LEEP)  3. Cervical cancer screening - Cytology - PAP( [place])  4. Vaginal atrophy - physical exam findings discussed and treatment options discussed.  Will start with vaginal estrogen cream. Risks discussed as well. - estradiol (ESTRACE) 0.1 MG/GM vaginal cream; 1 gram vaginally twice weekly  Dispense: 42.5 g; Refill: 1  5. Mammogram abnormal - questions about this answered for pt.  She does already have follow up scheduled.

## 2021-05-27 NOTE — Patient Instructions (Addendum)
Revaree - hyaluronic acid vaginal suppositories  KY jelly Astroglide

## 2021-06-03 LAB — CYTOLOGY - PAP
Comment: NEGATIVE
Diagnosis: UNDETERMINED — AB
High risk HPV: NEGATIVE

## 2021-06-04 ENCOUNTER — Other Ambulatory Visit: Payer: Self-pay | Admitting: Family Medicine

## 2021-06-04 ENCOUNTER — Ambulatory Visit
Admission: RE | Admit: 2021-06-04 | Discharge: 2021-06-04 | Disposition: A | Discharge status: Home / Self Care | Payer: Medicare Other | Source: Ambulatory Visit | Attending: Family Medicine | Admitting: Family Medicine

## 2021-06-04 DIAGNOSIS — R921 Mammographic calcification found on diagnostic imaging of breast: Secondary | ICD-10-CM

## 2021-06-04 DIAGNOSIS — R928 Other abnormal and inconclusive findings on diagnostic imaging of breast: Secondary | ICD-10-CM

## 2021-06-13 ENCOUNTER — Other Ambulatory Visit: Payer: Self-pay | Admitting: Family Medicine

## 2021-06-13 DIAGNOSIS — E119 Type 2 diabetes mellitus without complications: Secondary | ICD-10-CM

## 2021-06-16 ENCOUNTER — Ambulatory Visit
Admission: RE | Admit: 2021-06-16 | Discharge: 2021-06-16 | Disposition: A | Discharge status: Home / Self Care | Payer: Medicare Other | Source: Ambulatory Visit | Attending: Family Medicine | Admitting: Family Medicine

## 2021-06-16 ENCOUNTER — Other Ambulatory Visit (HOSPITAL_COMMUNITY): Payer: Self-pay | Admitting: Diagnostic Radiology

## 2021-06-16 DIAGNOSIS — R921 Mammographic calcification found on diagnostic imaging of breast: Secondary | ICD-10-CM

## 2021-06-18 ENCOUNTER — Other Ambulatory Visit: Payer: Self-pay | Admitting: Family Medicine

## 2021-06-18 DIAGNOSIS — E119 Type 2 diabetes mellitus without complications: Secondary | ICD-10-CM

## 2021-11-10 ENCOUNTER — Encounter (INDEPENDENT_AMBULATORY_CARE_PROVIDER_SITE_OTHER): Payer: Self-pay

## 2021-11-25 ENCOUNTER — Other Ambulatory Visit (HOSPITAL_BASED_OUTPATIENT_CLINIC_OR_DEPARTMENT_OTHER): Payer: Self-pay | Admitting: *Deleted

## 2021-11-25 DIAGNOSIS — N952 Postmenopausal atrophic vaginitis: Secondary | ICD-10-CM

## 2021-11-25 MED ORDER — ESTRADIOL 0.1 MG/GM VA CREA: TOPICAL_CREAM | VAGINAL | 1 refills | Status: AC

## 2021-11-25 NOTE — Progress Notes (Signed)
[facility] requesting refill on estradiol vaginal cream. Rx sent

## 2022-05-04 ENCOUNTER — Other Ambulatory Visit: Payer: Self-pay | Admitting: Adult Health

## 2022-05-04 DIAGNOSIS — M8580 Other specified disorders of bone density and structure, unspecified site: Secondary | ICD-10-CM

## 2022-05-13 ENCOUNTER — Ambulatory Visit (HOSPITAL_BASED_OUTPATIENT_CLINIC_OR_DEPARTMENT_OTHER): Payer: Medicare Other | Admitting: Obstetrics & Gynecology

## 2022-05-17 ENCOUNTER — Other Ambulatory Visit: Payer: Self-pay | Admitting: Adult Health

## 2022-05-17 DIAGNOSIS — Z1231 Encounter for screening mammogram for malignant neoplasm of breast: Secondary | ICD-10-CM

## 2022-05-23 ENCOUNTER — Encounter: Payer: Self-pay | Admitting: *Deleted

## 2022-06-03 ENCOUNTER — Ambulatory Visit (INDEPENDENT_AMBULATORY_CARE_PROVIDER_SITE_OTHER): Payer: Medicare HMO | Admitting: Obstetrics & Gynecology

## 2022-06-03 ENCOUNTER — Encounter (HOSPITAL_BASED_OUTPATIENT_CLINIC_OR_DEPARTMENT_OTHER): Payer: Self-pay | Admitting: Obstetrics & Gynecology

## 2022-06-03 VITALS — BP 142/52 | HR 64 | Ht 64.25 in | Wt 176.0 lb

## 2022-06-03 DIAGNOSIS — Z01419 Encounter for gynecological examination (general) (routine) without abnormal findings: Secondary | ICD-10-CM | POA: Diagnosis not present

## 2022-06-03 DIAGNOSIS — Z780 Asymptomatic menopausal state: Secondary | ICD-10-CM | POA: Diagnosis not present

## 2022-06-03 DIAGNOSIS — N952 Postmenopausal atrophic vaginitis: Secondary | ICD-10-CM

## 2022-06-03 DIAGNOSIS — Z9889 Other specified postprocedural states: Secondary | ICD-10-CM

## 2022-06-03 NOTE — Progress Notes (Signed)
77 y.o. G45P1011 Divorced White or Caucasian female here for breast and pelvic exam.  I am also following her for pmp status and h/o vaginal atrophy.  Has become SA since last seen.  Using vaginal estrogen cream with success.  Not really needing like she did at first.  Uses lubricant.  Doesn't need RF but will let me know if/when she does.    Significant other is out of state..  Denies vaginal bleeding.   Patient's last menstrual period was 04/04/2000.          Sexually active: Yes.    H/O STD:  no  Health Maintenance: PCP:  [facility].  Last wellness appt was 06/2020.  Did blood work at that appt:  yes Colonoscopy:  04/18/2016 MMG:  05/21/2021 Need additional images BMD:  12/25/2019 Osteopenia Last pap smear:  05/27/2021 [facility].   H/o abnormal pap smear:  yes, treated with LEEP 25-30 years ago.      reports that she has never smoked. She has never used smokeless tobacco. She reports current alcohol use of about 1.0 standard drink of alcohol per week. She reports that she does not use drugs.  Past Medical History:  Diagnosis Date   Abnormal liver function tests 07/19/2015   Allergic state 02/01/2010   Qualifier: Diagnosis of  By: Fabian Sharp MD, Neta Mends    Allergy    Anxiety    Chicken pox 77 yrs old   Depressive disorder, not elsewhere classified    Diabetes ([facility])    diet controlled- no medicines    Fibroid    Hyperglycemia    Hyperlipidemia    Knee pain, left 10/05/2011   Follows with Dr Marlene Lard   Measles as a child   OA (osteoarthritis) of knee    left knee   Obesity 10/05/2011   Osteoarthritis    Osteopenia 06/26/2014   Other and unspecified hyperlipidemia    Overweight 10/05/2011   Sleep apnea    Stye 11/19/2012   Syncope 01/26/2017   Thyroid disease    h/o hyperthyroid treated with radioactive iodine? from 16 to 20   Type 2 diabetes mellitus with hyperlipidemia ([facility])    Varicose vein of leg     Past Surgical History:  Procedure Laterality Date   CESAREAN  SECTION  1987   KNEE SURGERY Right 2006   R knee fx. patella   LEEP  1998   pap smears all normal since   LUMBAR DISC SURGERY  2002   L5    Current Outpatient Medications  Medication Sig Dispense Refill   aspirin 81 MG tablet Take 81 mg by mouth daily.     CALCIUM 600 1500 (600 Ca) MG TABS tablet      cetirizine (ZYRTEC) 10 MG tablet Take 10 mg by mouth daily.     cholecalciferol (VITAMIN D) 1000 units tablet Take 2,000 Units by mouth daily.      DULoxetine (CYMBALTA) 30 MG capsule TAKE ONE CAPSULE BY MOUTH DAILY *NEEDS OFFICE VISIT FOR FURTHER REFILLS* 30 capsule 0   estradiol (ESTRACE) 0.1 MG/GM vaginal cream 1 gram vaginally twice weekly 42.5 g 1   ezetimibe (ZETIA) 10 MG tablet TAKE ONE TABLET BY MOUTH DAILY 90 tablet 1   fluticasone (FLONASE) 50 MCG/ACT nasal spray Place 1 spray into both nostrils as needed.      MAGNESIUM PO Take by mouth.     metFORMIN (GLUCOPHAGE) 500 MG tablet TAKE 1 TABLET (500 MG TOTAL) BY MOUTH 2 (TWO) TIMES DAILY WITH A MEAL  180 tablet 1   pravastatin (PRAVACHOL) 80 MG tablet TAKE ONE TABLET BY MOUTH DAILY 90 tablet 0   Turmeric 500 MG CAPS Take by mouth.     No current facility-administered medications for this visit.    Family History  Problem Relation Age of Onset   Diabetes Paternal Grandmother        type 2?   Dementia Maternal Grandmother        alzheimer   Alcohol abuse Maternal Grandfather    Hyperlipidemia Father    Heart disease Father        bypass, CAD   Sleep apnea Father    Hyperlipidemia Mother    Dementia Mother    Obesity Mother    Hyperlipidemia Sister    Diabetes Sister        type 2   Colon polyps Sister    Impulse control disorder Sister    Colon cancer Neg Hx    Esophageal cancer Neg Hx    Rectal cancer Neg Hx    Stomach cancer Neg Hx     Review of Systems  Constitutional: Negative.   Genitourinary: Negative.     Exam:   BP (!) 142/52 (BP Location: Right Arm, Patient Position: Sitting, Cuff Size: Large)    Pulse 64   Ht 5' 4.25" (1.632 m)   Wt 176 lb (79.8 kg)   LMP 04/04/2000   BMI 29.98 kg/m   Height: 5' 4.25" (163.2 cm)  General appearance: alert, cooperative and appears stated age Breasts: normal appearance, no masses or tenderness Abdomen: soft, non-tender; bowel sounds normal; no masses,  no organomegaly Lymph nodes: Cervical, supraclavicular, and axillary nodes normal.  No abnormal inguinal nodes palpated Neurologic: Grossly normal  Pelvic: External genitalia:  no lesions              Urethra:  normal appearing urethra with no masses, tenderness or lesions              Bartholins and Skenes: normal                 Vagina: normal appearing vagina with mild atrophic changes and no discharge, no lesions              Cervix: no lesions              Pap taken: No. Bimanual Exam:  Uterus:  normal size, contour, position, consistency, mobility, non-tender              Adnexa: normal adnexa and no mass, fullness, tenderness               Rectovaginal: Confirms               Anus:  normal sphincter tone, no lesions  Chaperone, Ina Homes, CMA, was present for exam.  Assessment/Plan: 1. Encntr for gyn exam (general) (routine) w/o abn findings - Pap smear ASCUS with neg HR HPV 05/2021.  Repeat in 2026. - Mammogram 05/21/2021, f/u was normal - Colonoscopy 04/18/2016 - Bone mineral density 12/25/2019 - lab work done with PCP in 2022  2. Vaginal atrophy - she does not need estradiol cream RF but will call if/when does  3. Postmenopausal - not on HRT  4. H/O LEEP - about 25-30 years ago

## 2022-10-24 ENCOUNTER — Ambulatory Visit
Admission: RE | Admit: 2022-10-24 | Discharge: 2022-10-24 | Disposition: A | Discharge status: Home / Self Care | Payer: Medicare HMO | Source: Ambulatory Visit | Attending: Adult Health | Admitting: Adult Health

## 2022-10-24 ENCOUNTER — Ambulatory Visit
Admission: RE | Admit: 2022-10-24 | Discharge: 2022-10-24 | Disposition: A | Discharge status: Home / Self Care | Payer: Medicare Other | Source: Ambulatory Visit | Attending: Adult Health | Admitting: Adult Health

## 2022-10-24 DIAGNOSIS — M8580 Other specified disorders of bone density and structure, unspecified site: Secondary | ICD-10-CM

## 2022-10-24 DIAGNOSIS — Z1231 Encounter for screening mammogram for malignant neoplasm of breast: Secondary | ICD-10-CM

## 2023-04-10 ENCOUNTER — Ambulatory Visit (INDEPENDENT_AMBULATORY_CARE_PROVIDER_SITE_OTHER): Payer: Medicare HMO | Admitting: Podiatry

## 2023-04-10 ENCOUNTER — Encounter: Payer: Self-pay | Admitting: Podiatry

## 2023-04-10 ENCOUNTER — Ambulatory Visit (INDEPENDENT_AMBULATORY_CARE_PROVIDER_SITE_OTHER): Payer: Medicare HMO

## 2023-04-10 DIAGNOSIS — M79671 Pain in right foot: Secondary | ICD-10-CM

## 2023-04-10 DIAGNOSIS — M2041 Other hammer toe(s) (acquired), right foot: Secondary | ICD-10-CM | POA: Diagnosis not present

## 2023-04-10 DIAGNOSIS — M7751 Other enthesopathy of right foot: Secondary | ICD-10-CM | POA: Diagnosis not present

## 2023-04-10 MED ORDER — TRIAMCINOLONE ACETONIDE 10 MG/ML IJ SUSP
10.0000 mg | Freq: Once | INTRAMUSCULAR | Status: AC
  Administered 2023-04-10: 10 mg via INTRA_ARTICULAR

## 2023-04-11 NOTE — Progress Notes (Signed)
 Subjective:   Patient ID: Cindy [name], female   DOB: 78 y.o.   MRN: [id number]   HPI Patient presents stating that she has got several different problems with digital deformities of the third fourth fifth toes and a lot of pain between the fourth and fifth toe on the right foot.  Patient knows someday she could require surgery   ROS      Objective:  Physical Exam  Neurovascular status intact with the patient found to have lesion formation fluid buildup in her phalangeal joint digit for right with rotational component of the third fourth and fifth digits right foot moderate pain     Assessment:  Inflammatory capsulitis digit 4 right with pain along with hammertoe deformity of the lesser digits     Plan:  H&P reviewed all conditions discussed digital correction possibility for straightening of the toes but at this point organ to work on the inflammation I did sterile prep I injected the inner phalangeal joint digit for lateral side 2 mg dexamethasone  Kenalog  5 mg Xylocaine and I went ahead and I debrided the lesion courtesy applied cushioning reappoint as symptoms indicate  X-rays indicate there is rotation of the fifth digit right foot pressing against the fourth toe moderate deformity both toes with mild on the third

## 2024-01-05 ENCOUNTER — Other Ambulatory Visit: Payer: Self-pay | Admitting: Student

## 2024-01-05 DIAGNOSIS — Z1231 Encounter for screening mammogram for malignant neoplasm of breast: Secondary | ICD-10-CM

## 2024-01-14 ENCOUNTER — Other Ambulatory Visit: Payer: Self-pay | Admitting: Medical Genetics

## 2024-01-19 ENCOUNTER — Ambulatory Visit
Admission: RE | Admit: 2024-01-19 | Discharge: 2024-01-19 | Disposition: A | Source: Ambulatory Visit | Attending: Student

## 2024-01-19 DIAGNOSIS — Z1231 Encounter for screening mammogram for malignant neoplasm of breast: Secondary | ICD-10-CM

## 2024-02-19 ENCOUNTER — Ambulatory Visit: Admitting: Podiatry

## 2024-02-19 ENCOUNTER — Encounter: Payer: Self-pay | Admitting: Podiatry

## 2024-02-19 DIAGNOSIS — M7751 Other enthesopathy of right foot: Secondary | ICD-10-CM | POA: Diagnosis not present

## 2024-02-19 MED ORDER — TRIAMCINOLONE ACETONIDE 10 MG/ML IJ SUSP
10.0000 mg | Freq: Once | INTRAMUSCULAR | Status: AC
  Administered 2024-02-19: 10 mg via INTRA_ARTICULAR

## 2024-02-21 NOTE — Progress Notes (Signed)
 Subjective:   Patient ID: Cindy [name], female   DOB: 78 y.o.   MRN: [id number]   HPI Patient presents with a very inflamed area on the lateral side of the fourth digit right foot painful   ROS      Objective:  Physical Exam  Inflammatory capsulitis digit 4 right inner phalangeal joint     Assessment:  Fluid buildup around the joint surface with pain     Plan:  H&P reviewed condition possibility for arthroplasty future sterile prep injected the inner phalangeal joint 2 mg dexamethasone  Kenalog  5 mg Xylocaine and applied sterile dressing

## 2024-03-01 ENCOUNTER — Other Ambulatory Visit (HOSPITAL_COMMUNITY)
Admission: RE | Admit: 2024-03-01 | Discharge: 2024-03-01 | Disposition: A | Discharge status: Home / Self Care | Payer: Self-pay | Source: Ambulatory Visit | Attending: Medical Genetics | Admitting: Medical Genetics

## 2024-03-13 LAB — GENECONNECT MOLECULAR SCREEN: Genetic Analysis Overall Interpretation: NEGATIVE

## 2024-06-03 ENCOUNTER — Ambulatory Visit (HOSPITAL_BASED_OUTPATIENT_CLINIC_OR_DEPARTMENT_OTHER): Payer: Medicare HMO | Admitting: Obstetrics & Gynecology
